# Patient Record
Sex: Female | Born: 1940
Health system: Southern US, Community
[De-identification: ages and names within clinical notes are randomized; demographics above are authoritative.]

## PROBLEM LIST (undated history)

## (undated) DIAGNOSIS — E7800 Pure hypercholesterolemia, unspecified: Secondary | ICD-10-CM

## (undated) DIAGNOSIS — Z95820 Peripheral vascular angioplasty status with implants and grafts: Secondary | ICD-10-CM

## (undated) DIAGNOSIS — I2510 Atherosclerotic heart disease of native coronary artery without angina pectoris: Secondary | ICD-10-CM

## (undated) DIAGNOSIS — R9439 Abnormal result of other cardiovascular function study: Secondary | ICD-10-CM

## (undated) DIAGNOSIS — R9431 Abnormal electrocardiogram [ECG] [EKG]: Secondary | ICD-10-CM

## (undated) DIAGNOSIS — K219 Gastro-esophageal reflux disease without esophagitis: Secondary | ICD-10-CM

## (undated) DIAGNOSIS — M189 Osteoarthritis of first carpometacarpal joint, unspecified: Secondary | ICD-10-CM

## (undated) DIAGNOSIS — A159 Respiratory tuberculosis unspecified: Secondary | ICD-10-CM

## (undated) DIAGNOSIS — M1990 Unspecified osteoarthritis, unspecified site: Secondary | ICD-10-CM

## (undated) DIAGNOSIS — C801 Malignant (primary) neoplasm, unspecified: Secondary | ICD-10-CM

## (undated) DIAGNOSIS — I10 Essential (primary) hypertension: Secondary | ICD-10-CM

## (undated) DIAGNOSIS — I208 Other forms of angina pectoris: Secondary | ICD-10-CM

## (undated) DIAGNOSIS — T4145XA Adverse effect of unspecified anesthetic, initial encounter: Secondary | ICD-10-CM

## (undated) HISTORY — PX: FOREARM / WRIST TENDON LESION EXCISION: SUR532

## (undated) HISTORY — PX: DILATION AND CURETTAGE OF UTERUS: SHX78

## (undated) HISTORY — PX: INGUINAL HERNIA REPAIR: SUR1180

## (undated) SURGERY — Surgical Case
Anesthesia: *Unknown

---
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
## 1978-05-19 HISTORY — PX: TUBAL LIGATION: SHX77

## 1998-05-03 ENCOUNTER — Other Ambulatory Visit: Admission: RE | Admit: 1998-05-03 | Discharge: 1998-05-03 | Payer: Self-pay | Admitting: *Deleted

## 1999-05-06 ENCOUNTER — Other Ambulatory Visit: Admission: RE | Admit: 1999-05-06 | Discharge: 1999-05-06 | Payer: Self-pay | Admitting: *Deleted

## 1999-08-13 ENCOUNTER — Encounter: Payer: Self-pay | Admitting: Family Medicine

## 1999-08-13 ENCOUNTER — Encounter: Admission: RE | Admit: 1999-08-13 | Discharge: 1999-08-13 | Payer: Self-pay | Admitting: Family Medicine

## 2000-01-13 ENCOUNTER — Encounter: Payer: Self-pay | Admitting: Emergency Medicine

## 2000-01-13 ENCOUNTER — Emergency Department (HOSPITAL_COMMUNITY): Admission: EM | Admit: 2000-01-13 | Discharge: 2000-01-13 | Payer: Self-pay | Admitting: Emergency Medicine

## 2000-05-07 ENCOUNTER — Other Ambulatory Visit: Admission: RE | Admit: 2000-05-07 | Discharge: 2000-05-07 | Payer: Self-pay | Admitting: *Deleted

## 2000-08-13 ENCOUNTER — Encounter: Admission: RE | Admit: 2000-08-13 | Discharge: 2000-08-13 | Payer: Self-pay | Admitting: *Deleted

## 2000-08-13 ENCOUNTER — Encounter: Payer: Self-pay | Admitting: *Deleted

## 2001-05-10 ENCOUNTER — Other Ambulatory Visit: Admission: RE | Admit: 2001-05-10 | Discharge: 2001-05-10 | Payer: Self-pay | Admitting: *Deleted

## 2001-08-24 ENCOUNTER — Encounter: Payer: Self-pay | Admitting: *Deleted

## 2001-08-24 ENCOUNTER — Encounter: Admission: RE | Admit: 2001-08-24 | Discharge: 2001-08-24 | Payer: Self-pay | Admitting: *Deleted

## 2002-07-21 ENCOUNTER — Encounter (INDEPENDENT_AMBULATORY_CARE_PROVIDER_SITE_OTHER): Payer: Self-pay | Admitting: Specialist

## 2002-07-21 ENCOUNTER — Ambulatory Visit (HOSPITAL_BASED_OUTPATIENT_CLINIC_OR_DEPARTMENT_OTHER): Admission: RE | Admit: 2002-07-21 | Discharge: 2002-07-22 | Payer: Self-pay | Admitting: General Surgery

## 2002-07-21 HISTORY — PX: OSTEOTOMY AND ULNAR SHORTENING: SHX2140

## 2002-08-30 ENCOUNTER — Encounter: Payer: Self-pay | Admitting: Family Medicine

## 2002-08-30 ENCOUNTER — Encounter: Admission: RE | Admit: 2002-08-30 | Discharge: 2002-08-30 | Payer: Self-pay | Admitting: Family Medicine

## 2002-10-31 ENCOUNTER — Ambulatory Visit (HOSPITAL_COMMUNITY): Admission: RE | Admit: 2002-10-31 | Discharge: 2002-10-31 | Payer: Self-pay | Admitting: Gastroenterology

## 2002-10-31 ENCOUNTER — Encounter: Payer: Self-pay | Admitting: Gastroenterology

## 2003-12-05 ENCOUNTER — Other Ambulatory Visit: Admission: RE | Admit: 2003-12-05 | Discharge: 2003-12-05 | Payer: Self-pay | Admitting: Obstetrics and Gynecology

## 2005-01-09 ENCOUNTER — Other Ambulatory Visit: Admission: RE | Admit: 2005-01-09 | Discharge: 2005-01-09 | Payer: Self-pay | Admitting: Obstetrics and Gynecology

## 2007-06-30 ENCOUNTER — Emergency Department (HOSPITAL_COMMUNITY): Admission: EM | Admit: 2007-06-30 | Discharge: 2007-07-01 | Payer: Self-pay | Admitting: Emergency Medicine

## 2009-05-19 DIAGNOSIS — K219 Gastro-esophageal reflux disease without esophagitis: Secondary | ICD-10-CM

## 2009-05-19 HISTORY — DX: Gastro-esophageal reflux disease without esophagitis: K21.9

## 2009-10-26 ENCOUNTER — Encounter (INDEPENDENT_AMBULATORY_CARE_PROVIDER_SITE_OTHER): Payer: Self-pay | Admitting: *Deleted

## 2009-12-17 DIAGNOSIS — T8859XA Other complications of anesthesia, initial encounter: Secondary | ICD-10-CM

## 2009-12-17 HISTORY — DX: Other complications of anesthesia, initial encounter: T88.59XA

## 2010-06-09 ENCOUNTER — Encounter: Payer: Self-pay | Admitting: Obstetrics and Gynecology

## 2010-06-18 NOTE — Letter (Signed)
Summary: Colonoscopy Date Change Letter  [facility]  [address] [place], [place] [address]   Phone: [phone]  Fax: [phone]      October 26, 2009 MRN: [id number]   Brittany Figueroa [address] [place], [place]  [address]   Dear Ms. Mussell,   Previously you were recommended to have a repeat colonoscopy around this time. Your chart was recently reviewed by Dr. Judie Petit T. Russella Dar of [facility]. Follow up colonoscopy is now recommended in July 2014. This revised recommendation is based on current, nationally recognized guidelines for colorectal cancer screening and polyp surveillance. These guidelines are endorsed by the American Cancer Society, [employer] on Colorectal Cancer as well as numerous other major medical organizations.  Please understand that our recommendation assumes that you do not have any new symptoms such as bleeding, a change in bowel habits, anemia, or significant abdominal discomfort. If you do have any concerning GI symptoms or want to discuss the guideline recommendations, please call to arrange an office visit at your earliest convenience. Otherwise we will keep you in our reminder system and contact you 1-2 months prior to the date listed above to schedule your next colonoscopy.  Thank you,  Judie Petit T. Russella Dar, M.D.  [facility] [phone]

## 2010-08-08 ENCOUNTER — Other Ambulatory Visit: Payer: Self-pay | Admitting: Obstetrics and Gynecology

## 2010-10-04 NOTE — Op Note (Signed)
NAME:  Brittany Figueroa, BURKES                           ACCOUNT NO.:  [id number]   MEDICAL RECORD NO.:  [id number]                   PATIENT TYPE:  AMB   LOCATION:  DSC                                  FACILITY:  [facility]   PHYSICIAN:  Dionne Ano. Everlene Other, M.D.         DATE OF BIRTH:  [birth year]   DATE OF PROCEDURE:  07/21/2002  DATE OF DISCHARGE:                                 OPERATIVE REPORT   PREOPERATIVE DIAGNOSIS:  Right ulnocarpal abutment with central triangular  fibrocartilage complex tear.   POSTOPERATIVE DIAGNOSIS:  Right ulnocarpal abutment with central triangular  fibrocartilage complex tear.   PROCEDURE:  1. Right ulnar shortening osteotomy with Rayhack device.  2. Right wrist arthroscopy.  3. Stress radiography, right forearm and wrist.   SURGEON:  Dionne Ano. Amanda Pea, M.D.   ASSISTANT:  Karie Chimera, P.A.-C.   COMPLICATIONS:  None.   ANESTHESIA:  General.   TOURNIQUET TIME:  Less than an hour.   ESTIMATED BLOOD LOSS:  Minimal.   INDICATIONS FOR PROCEDURE:  The patient is a very pleasant, 70 year old  female who presents with ulnocarpal abutment syndrome.  She has undergone a  previous TFC debridement but unfortunately continues to have pain due to the  ulnocarpal abutment.  I have counseled her in regards to the risks and  benefits of surgery, including risk of infection, bleeding, anesthesia,  damage to normal structures, and failure of surgery to accomplish its  intended goals of relieving symptoms and restoring function.  With this in  mind, she desires to proceed.  I have discussed the operation in detail as  outlined.  She understands the risks and benefits and desires to proceed.  Our goal is to shorten the ulna to prevent ulnocarpal abutment, to check the  TFC to make sure there is no peripheral tear or worsened degenerative tear  features.  She understands this, the risks and benefits, and desires to  proceed.   OPERATIVE FINDINGS:  The patient had  ulnar-positive variance as noted  preoperatively.  She was shortened to an ulnar neutral to slight minus level  using hole slots 2, 3 and 4 of the ulnar shortening guide.  The patient was  shortened nicely.  Bone apposition was excellent and arthroscopy revealed a  central TFC with no peripheral tear component.  She tolerated the procedure  well, and there were no complications.   OPERATIVE PROCEDURE IN DETAIL:  The patient was seen by myself and  Anesthesia.  She was taken to the operative suite and underwent a general  anesthetic and was given preoperative antibiotics.  Following this, Dr.  Earlene Plater performed a hernia repair.  Following this, the patient had the arm  identified correctly to be operated on.  The patient was prepped and draped  in the usual sterile fashion with Betadine scrub and paint in the right  upper extremity.  Following this, the patient had finger traps placed.  A  traction towel was then placed, and the patient had the tourniquet  insufflated to 250 mmHg.  An incision was made along the subcutaneous border  of the ulna.  Dissection was carried out between the ECU and FCU interval.  The ulna was then identified.  I did not perform any excessive periosteal  elevation, trying to keep the periosteum intact.  Once this was done, the  Rayhack osteotomy device was placed.  It was then secured in position  without difficulty utilizing standard protocol.  Once this was done, the  patient then underwent placement of the saw guide.  Distal was cut first,  followed by proximal.  These were slots 2 and 3.  Once the slots were cut,  the oblique portion of bone was taken out, and the osteotomy device was  removed.  Following this, I placed the compression guide on; and in holes 3  and 4, secured them to the plate which was previously placed and previously  pre-bent.  Following this, tension was tightened, and the shortening device  allowed for excellent compression.  I was pleased  with the compression at  this time.  I filled the remaining screw holes, including the  interfragmentary screw.  This was done with a 2.7 followed by a 2.0 drill  bit and a 2.7 screw was then placed for compression purposes.  The patient  tolerated this well.  I was very pleased with the compression and alignment.  The patient had excellent apposition as checked under radiographic view and  stress radiography.  She was shortened to an ulnar neutral with slight ulnar  minus variance.  The patient tolerated this well without difficulty, and  there were no complications.  Once this was done, the patient then underwent  a wrist arthroscopy utilizing a 3-4 and 6 R-portal.  Dissection was carried  down, and the scope was introduced through a 3-4 portal.  Outflow cannula  was created.  Following this, a systematic arthroscopy was accomplished.  There was some fraying and degenerative changes in the wrist joint itself,  including fraying of the scapholunate membranous portion of the ligament.  There were no destabilizing tears noted.  The TFC had central tearing which  was previously debrided.  The patient had no peripheral avulsion.  I was  pleased with the findings.  The patient had a limited debridement without  difficulty applied.  Once this was done, I removed the arthroscopic  instruments.  At this point in time, I washed the arm and with the  tourniquet deflated, closed the interval between the ECU and FCU, followed  by closure of the subcutaneous tissue with Vicryl and closure of the skin  edge with Prolene.  She had soft compartments and no excessive bleeding and  looked excellent.  Arthroscopic portals were closed with interrupted  Prolene.  She was placed in a long-arm splint with the hand in functional  position.  She tolerated this well.  She was then extubated and transferred  to the recovery room.  She will be monitored closely.  We will begin IV antibiotics, close observation, and  postoperative protocol.  Postoperative  hernia protocol per Dr. Earlene Plater will be adhered to, and his dictation will be  on a separate line.  All questions have been encouraged and answered.  Dionne Ano. Everlene Other, M.D.    Nash Mantis  D:  07/21/2002  T:  07/22/2002  Job:  [id number]

## 2010-10-04 NOTE — Op Note (Signed)
NAME:  Brittany Figueroa, Brittany Figueroa                           ACCOUNT NO.:  [id number]   MEDICAL RECORD NO.:  [id number]                   PATIENT TYPE:  AMB   LOCATION:  DSC                                  FACILITY:  [facility]   PHYSICIAN:  Timothy E. Earlene Plater, M.D.              DATE OF BIRTH:  11/19/1940   DATE OF PROCEDURE:  07/21/2002  DATE OF DISCHARGE:                                 OPERATIVE REPORT   PREOPERATIVE DIAGNOSIS:  Left inguinal hernia.   POSTOPERATIVE DIAGNOSIS:  Left indirect inguinal hernia.   PROCEDURE:  Repair of hernia with mesh.   SURGEON:  Timothy E. Earlene Plater, M.D.   ANESTHESIA:  General.   INDICATIONS FOR PROCEDURE:  The patient presents with a left inguinal hernia  of increasing size and discomfort and she wishes to have a repair.  She did  have a right inguinal hernia repaired remotely and she is undergoing a wrist  arthroscopy today at the same location and same anesthetic by Dionne Ano.  Amanda Pea, M.D.  The patient was evaluated, identified, permit signed, and the  site marked.   DESCRIPTION OF PROCEDURE:  She was taken to the operating room, placed  supine, general endotracheal anesthesia administered.  The left groin was  shaved, prepped and draped in the usual fashion.  0.25% Marcaine with  epinephrine was used prior to incision. A short curvilinear incision was  made in the left groin, the abundant subcutaneous fatty tissue dissection,  the external oblique identified, and a large canal was noted. More local  anesthesia was applied and the external oblique opened in line with the  fibers to the external ring. A large indirect hernia and lipoma were noted.  This was dissected from the floor of the canal. The round ligament was  small.  The ilioinguinal nerve was identified and kept medial to the  dissection.  The sac and lipoma were dissected out back to the internal  ring. The sac was opened, it was empty and the sac, lipoma, and round  ligament were ligated with a  2-0 Prolene suture.  The excess sac and lipoma  were excised and the neck retracted through the internal ring.  Specimen was  submitted for pathology.  The internal ring closed with a suture of 2-0  Prolene.  A small piece of mesh was cut in a fashion to fit the floor of the  canal and sewn down with running 2-0 Prolene sutures to cover the entire  floor.  The external oblique was closed with a running 2-0 Vicryl.  Deep  subcu with 2-0 Vicryl and the skin with 3-0 Monocryl.  Steri-Strips were  applied. A dry sterile dressing was applied.  Needle, sponge, and instrument  count correct.  The patient tolerated the procedure well and was stable.  The patient was turned over to [facility]. Amanda Pea, M.D. for his surgery.  Timothy E. Earlene Plater, M.D.    TED/MEDQ  D:  07/21/2002  T:  07/22/2002  Job:  [id number]

## 2011-02-07 LAB — CK TOTAL AND CKMB (NOT AT ARMC)
CK, MB: 1.9
Relative Index: 1.8
Total CK: 105

## 2011-02-07 LAB — BASIC METABOLIC PANEL
BUN: 19
CO2: 26
Calcium: 9
Chloride: 101
Creatinine, Ser: 0.77
GFR calc Af Amer: >60
GFR calc non Af Amer: >60
Glucose, Bld: 108 — ABNORMAL HIGH
Potassium: 4.1
Sodium: 135

## 2011-02-07 LAB — TROPONIN I: Troponin I: 0.04

## 2011-05-27 DIAGNOSIS — R7989 Other specified abnormal findings of blood chemistry: Secondary | ICD-10-CM | POA: Diagnosis not present

## 2011-05-27 DIAGNOSIS — J209 Acute bronchitis, unspecified: Secondary | ICD-10-CM | POA: Diagnosis not present

## 2011-06-04 DIAGNOSIS — K219 Gastro-esophageal reflux disease without esophagitis: Secondary | ICD-10-CM | POA: Diagnosis not present

## 2011-06-19 DIAGNOSIS — R209 Unspecified disturbances of skin sensation: Secondary | ICD-10-CM | POA: Diagnosis not present

## 2011-06-19 DIAGNOSIS — I10 Essential (primary) hypertension: Secondary | ICD-10-CM | POA: Diagnosis not present

## 2011-06-19 DIAGNOSIS — E785 Hyperlipidemia, unspecified: Secondary | ICD-10-CM | POA: Diagnosis not present

## 2011-06-19 DIAGNOSIS — R7301 Impaired fasting glucose: Secondary | ICD-10-CM | POA: Diagnosis not present

## 2011-06-26 DIAGNOSIS — E785 Hyperlipidemia, unspecified: Secondary | ICD-10-CM | POA: Diagnosis not present

## 2011-06-26 DIAGNOSIS — R7301 Impaired fasting glucose: Secondary | ICD-10-CM | POA: Diagnosis not present

## 2011-06-26 DIAGNOSIS — I10 Essential (primary) hypertension: Secondary | ICD-10-CM | POA: Diagnosis not present

## 2011-06-26 DIAGNOSIS — J309 Allergic rhinitis, unspecified: Secondary | ICD-10-CM | POA: Diagnosis not present

## 2011-07-17 DIAGNOSIS — M19049 Primary osteoarthritis, unspecified hand: Secondary | ICD-10-CM | POA: Diagnosis not present

## 2011-07-17 DIAGNOSIS — M25549 Pain in joints of unspecified hand: Secondary | ICD-10-CM | POA: Diagnosis not present

## 2011-08-05 DIAGNOSIS — M2010 Hallux valgus (acquired), unspecified foot: Secondary | ICD-10-CM | POA: Diagnosis not present

## 2011-08-07 DIAGNOSIS — R7301 Impaired fasting glucose: Secondary | ICD-10-CM | POA: Diagnosis not present

## 2011-08-07 DIAGNOSIS — I10 Essential (primary) hypertension: Secondary | ICD-10-CM | POA: Diagnosis not present

## 2011-08-07 DIAGNOSIS — E785 Hyperlipidemia, unspecified: Secondary | ICD-10-CM | POA: Diagnosis not present

## 2011-09-03 DIAGNOSIS — D485 Neoplasm of uncertain behavior of skin: Secondary | ICD-10-CM | POA: Diagnosis not present

## 2011-09-04 DIAGNOSIS — Z1212 Encounter for screening for malignant neoplasm of rectum: Secondary | ICD-10-CM | POA: Diagnosis not present

## 2011-09-04 DIAGNOSIS — Z1289 Encounter for screening for malignant neoplasm of other sites: Secondary | ICD-10-CM | POA: Diagnosis not present

## 2011-09-04 DIAGNOSIS — Z1231 Encounter for screening mammogram for malignant neoplasm of breast: Secondary | ICD-10-CM | POA: Diagnosis not present

## 2011-09-18 DIAGNOSIS — E559 Vitamin D deficiency, unspecified: Secondary | ICD-10-CM | POA: Diagnosis not present

## 2011-09-18 DIAGNOSIS — E785 Hyperlipidemia, unspecified: Secondary | ICD-10-CM | POA: Diagnosis not present

## 2011-09-18 DIAGNOSIS — R7301 Impaired fasting glucose: Secondary | ICD-10-CM | POA: Diagnosis not present

## 2011-09-18 DIAGNOSIS — I10 Essential (primary) hypertension: Secondary | ICD-10-CM | POA: Diagnosis not present

## 2011-09-25 DIAGNOSIS — R7301 Impaired fasting glucose: Secondary | ICD-10-CM | POA: Diagnosis not present

## 2011-09-25 DIAGNOSIS — I10 Essential (primary) hypertension: Secondary | ICD-10-CM | POA: Diagnosis not present

## 2011-09-25 DIAGNOSIS — E559 Vitamin D deficiency, unspecified: Secondary | ICD-10-CM | POA: Diagnosis not present

## 2011-09-25 DIAGNOSIS — E785 Hyperlipidemia, unspecified: Secondary | ICD-10-CM | POA: Diagnosis not present

## 2011-11-07 DIAGNOSIS — R21 Rash and other nonspecific skin eruption: Secondary | ICD-10-CM | POA: Diagnosis not present

## 2011-12-18 DIAGNOSIS — M67919 Unspecified disorder of synovium and tendon, unspecified shoulder: Secondary | ICD-10-CM | POA: Diagnosis not present

## 2011-12-18 DIAGNOSIS — M719 Bursopathy, unspecified: Secondary | ICD-10-CM | POA: Diagnosis not present

## 2011-12-18 DIAGNOSIS — M25519 Pain in unspecified shoulder: Secondary | ICD-10-CM | POA: Diagnosis not present

## 2011-12-26 DIAGNOSIS — E785 Hyperlipidemia, unspecified: Secondary | ICD-10-CM | POA: Diagnosis not present

## 2011-12-26 DIAGNOSIS — I10 Essential (primary) hypertension: Secondary | ICD-10-CM | POA: Diagnosis not present

## 2011-12-26 DIAGNOSIS — R7301 Impaired fasting glucose: Secondary | ICD-10-CM | POA: Diagnosis not present

## 2012-01-02 DIAGNOSIS — I10 Essential (primary) hypertension: Secondary | ICD-10-CM | POA: Diagnosis not present

## 2012-01-02 DIAGNOSIS — E785 Hyperlipidemia, unspecified: Secondary | ICD-10-CM | POA: Diagnosis not present

## 2012-01-02 DIAGNOSIS — R7301 Impaired fasting glucose: Secondary | ICD-10-CM | POA: Diagnosis not present

## 2012-01-02 DIAGNOSIS — E559 Vitamin D deficiency, unspecified: Secondary | ICD-10-CM | POA: Diagnosis not present

## 2012-01-15 DIAGNOSIS — M719 Bursopathy, unspecified: Secondary | ICD-10-CM | POA: Diagnosis not present

## 2012-01-15 DIAGNOSIS — S93409A Sprain of unspecified ligament of unspecified ankle, initial encounter: Secondary | ICD-10-CM | POA: Diagnosis not present

## 2012-01-15 DIAGNOSIS — M67919 Unspecified disorder of synovium and tendon, unspecified shoulder: Secondary | ICD-10-CM | POA: Diagnosis not present

## 2012-02-03 DIAGNOSIS — M9981 Other biomechanical lesions of cervical region: Secondary | ICD-10-CM | POA: Diagnosis not present

## 2012-02-03 DIAGNOSIS — M999 Biomechanical lesion, unspecified: Secondary | ICD-10-CM | POA: Diagnosis not present

## 2012-02-03 DIAGNOSIS — M5030 Other cervical disc degeneration, unspecified cervical region: Secondary | ICD-10-CM | POA: Diagnosis not present

## 2012-02-05 DIAGNOSIS — I10 Essential (primary) hypertension: Secondary | ICD-10-CM | POA: Diagnosis not present

## 2012-02-05 DIAGNOSIS — E785 Hyperlipidemia, unspecified: Secondary | ICD-10-CM | POA: Diagnosis not present

## 2012-02-05 DIAGNOSIS — R7301 Impaired fasting glucose: Secondary | ICD-10-CM | POA: Diagnosis not present

## 2012-02-12 DIAGNOSIS — M25579 Pain in unspecified ankle and joints of unspecified foot: Secondary | ICD-10-CM | POA: Diagnosis not present

## 2012-02-12 DIAGNOSIS — M542 Cervicalgia: Secondary | ICD-10-CM | POA: Diagnosis not present

## 2012-02-13 DIAGNOSIS — M542 Cervicalgia: Secondary | ICD-10-CM | POA: Diagnosis not present

## 2012-02-18 DIAGNOSIS — M67919 Unspecified disorder of synovium and tendon, unspecified shoulder: Secondary | ICD-10-CM | POA: Diagnosis not present

## 2012-02-18 DIAGNOSIS — M999 Biomechanical lesion, unspecified: Secondary | ICD-10-CM | POA: Diagnosis not present

## 2012-02-18 DIAGNOSIS — M542 Cervicalgia: Secondary | ICD-10-CM | POA: Diagnosis not present

## 2012-02-18 DIAGNOSIS — Q7649 Other congenital malformations of spine, not associated with scoliosis: Secondary | ICD-10-CM | POA: Diagnosis not present

## 2012-02-18 DIAGNOSIS — M5030 Other cervical disc degeneration, unspecified cervical region: Secondary | ICD-10-CM | POA: Diagnosis not present

## 2012-02-18 DIAGNOSIS — M9981 Other biomechanical lesions of cervical region: Secondary | ICD-10-CM | POA: Diagnosis not present

## 2012-03-01 DIAGNOSIS — M719 Bursopathy, unspecified: Secondary | ICD-10-CM | POA: Diagnosis not present

## 2012-03-01 DIAGNOSIS — M542 Cervicalgia: Secondary | ICD-10-CM | POA: Diagnosis not present

## 2012-03-01 DIAGNOSIS — M999 Biomechanical lesion, unspecified: Secondary | ICD-10-CM | POA: Diagnosis not present

## 2012-03-01 DIAGNOSIS — M5030 Other cervical disc degeneration, unspecified cervical region: Secondary | ICD-10-CM | POA: Diagnosis not present

## 2012-03-01 DIAGNOSIS — M9981 Other biomechanical lesions of cervical region: Secondary | ICD-10-CM | POA: Diagnosis not present

## 2012-03-01 DIAGNOSIS — Q7649 Other congenital malformations of spine, not associated with scoliosis: Secondary | ICD-10-CM | POA: Diagnosis not present

## 2012-03-01 DIAGNOSIS — M67919 Unspecified disorder of synovium and tendon, unspecified shoulder: Secondary | ICD-10-CM | POA: Diagnosis not present

## 2012-03-05 DIAGNOSIS — Q7649 Other congenital malformations of spine, not associated with scoliosis: Secondary | ICD-10-CM | POA: Diagnosis not present

## 2012-03-05 DIAGNOSIS — M542 Cervicalgia: Secondary | ICD-10-CM | POA: Diagnosis not present

## 2012-03-05 DIAGNOSIS — M67919 Unspecified disorder of synovium and tendon, unspecified shoulder: Secondary | ICD-10-CM | POA: Diagnosis not present

## 2012-03-10 DIAGNOSIS — Q7649 Other congenital malformations of spine, not associated with scoliosis: Secondary | ICD-10-CM | POA: Diagnosis not present

## 2012-03-10 DIAGNOSIS — M542 Cervicalgia: Secondary | ICD-10-CM | POA: Diagnosis not present

## 2012-03-10 DIAGNOSIS — M67919 Unspecified disorder of synovium and tendon, unspecified shoulder: Secondary | ICD-10-CM | POA: Diagnosis not present

## 2012-03-10 DIAGNOSIS — M719 Bursopathy, unspecified: Secondary | ICD-10-CM | POA: Diagnosis not present

## 2012-03-15 DIAGNOSIS — M719 Bursopathy, unspecified: Secondary | ICD-10-CM | POA: Diagnosis not present

## 2012-03-15 DIAGNOSIS — M67919 Unspecified disorder of synovium and tendon, unspecified shoulder: Secondary | ICD-10-CM | POA: Diagnosis not present

## 2012-03-15 DIAGNOSIS — M542 Cervicalgia: Secondary | ICD-10-CM | POA: Diagnosis not present

## 2012-03-15 DIAGNOSIS — Q7649 Other congenital malformations of spine, not associated with scoliosis: Secondary | ICD-10-CM | POA: Diagnosis not present

## 2012-03-19 DIAGNOSIS — M542 Cervicalgia: Secondary | ICD-10-CM | POA: Diagnosis not present

## 2012-03-19 DIAGNOSIS — Q7649 Other congenital malformations of spine, not associated with scoliosis: Secondary | ICD-10-CM | POA: Diagnosis not present

## 2012-03-19 DIAGNOSIS — M67919 Unspecified disorder of synovium and tendon, unspecified shoulder: Secondary | ICD-10-CM | POA: Diagnosis not present

## 2012-03-24 DIAGNOSIS — M67919 Unspecified disorder of synovium and tendon, unspecified shoulder: Secondary | ICD-10-CM | POA: Diagnosis not present

## 2012-03-24 DIAGNOSIS — R9430 Abnormal result of cardiovascular function study, unspecified: Secondary | ICD-10-CM | POA: Diagnosis not present

## 2012-03-24 DIAGNOSIS — E782 Mixed hyperlipidemia: Secondary | ICD-10-CM | POA: Diagnosis not present

## 2012-03-24 DIAGNOSIS — I10 Essential (primary) hypertension: Secondary | ICD-10-CM | POA: Diagnosis not present

## 2012-03-24 DIAGNOSIS — M719 Bursopathy, unspecified: Secondary | ICD-10-CM | POA: Diagnosis not present

## 2012-03-26 DIAGNOSIS — M719 Bursopathy, unspecified: Secondary | ICD-10-CM | POA: Diagnosis not present

## 2012-03-26 DIAGNOSIS — M67919 Unspecified disorder of synovium and tendon, unspecified shoulder: Secondary | ICD-10-CM | POA: Diagnosis not present

## 2012-03-26 DIAGNOSIS — Q7649 Other congenital malformations of spine, not associated with scoliosis: Secondary | ICD-10-CM | POA: Diagnosis not present

## 2012-03-26 DIAGNOSIS — M542 Cervicalgia: Secondary | ICD-10-CM | POA: Diagnosis not present

## 2012-03-29 DIAGNOSIS — R7301 Impaired fasting glucose: Secondary | ICD-10-CM | POA: Diagnosis not present

## 2012-03-29 DIAGNOSIS — IMO0001 Reserved for inherently not codable concepts without codable children: Secondary | ICD-10-CM | POA: Diagnosis not present

## 2012-03-29 DIAGNOSIS — E785 Hyperlipidemia, unspecified: Secondary | ICD-10-CM | POA: Diagnosis not present

## 2012-03-29 DIAGNOSIS — I10 Essential (primary) hypertension: Secondary | ICD-10-CM | POA: Diagnosis not present

## 2012-03-29 DIAGNOSIS — Z1331 Encounter for screening for depression: Secondary | ICD-10-CM | POA: Diagnosis not present

## 2012-03-29 DIAGNOSIS — Z23 Encounter for immunization: Secondary | ICD-10-CM | POA: Diagnosis not present

## 2012-03-29 DIAGNOSIS — Z0000 Encounter for general adult medical examination without abnormal findings: Secondary | ICD-10-CM | POA: Diagnosis not present

## 2012-03-31 DIAGNOSIS — I10 Essential (primary) hypertension: Secondary | ICD-10-CM | POA: Diagnosis not present

## 2012-03-31 DIAGNOSIS — M542 Cervicalgia: Secondary | ICD-10-CM | POA: Diagnosis not present

## 2012-03-31 DIAGNOSIS — M67919 Unspecified disorder of synovium and tendon, unspecified shoulder: Secondary | ICD-10-CM | POA: Diagnosis not present

## 2012-03-31 DIAGNOSIS — M719 Bursopathy, unspecified: Secondary | ICD-10-CM | POA: Diagnosis not present

## 2012-03-31 DIAGNOSIS — Q7649 Other congenital malformations of spine, not associated with scoliosis: Secondary | ICD-10-CM | POA: Diagnosis not present

## 2012-04-01 ENCOUNTER — Other Ambulatory Visit (HOSPITAL_COMMUNITY): Payer: Self-pay | Admitting: *Deleted

## 2012-04-01 DIAGNOSIS — R9431 Abnormal electrocardiogram [ECG] [EKG]: Secondary | ICD-10-CM

## 2012-04-01 DIAGNOSIS — M19019 Primary osteoarthritis, unspecified shoulder: Secondary | ICD-10-CM | POA: Diagnosis not present

## 2012-04-05 DIAGNOSIS — E785 Hyperlipidemia, unspecified: Secondary | ICD-10-CM | POA: Diagnosis not present

## 2012-04-05 DIAGNOSIS — E559 Vitamin D deficiency, unspecified: Secondary | ICD-10-CM | POA: Diagnosis not present

## 2012-04-05 DIAGNOSIS — R7301 Impaired fasting glucose: Secondary | ICD-10-CM | POA: Diagnosis not present

## 2012-04-05 DIAGNOSIS — F411 Generalized anxiety disorder: Secondary | ICD-10-CM | POA: Diagnosis not present

## 2012-04-05 DIAGNOSIS — M542 Cervicalgia: Secondary | ICD-10-CM | POA: Diagnosis not present

## 2012-04-05 DIAGNOSIS — I10 Essential (primary) hypertension: Secondary | ICD-10-CM | POA: Diagnosis not present

## 2012-04-06 DIAGNOSIS — M719 Bursopathy, unspecified: Secondary | ICD-10-CM | POA: Diagnosis not present

## 2012-04-06 DIAGNOSIS — M67919 Unspecified disorder of synovium and tendon, unspecified shoulder: Secondary | ICD-10-CM | POA: Diagnosis not present

## 2012-04-06 DIAGNOSIS — M542 Cervicalgia: Secondary | ICD-10-CM | POA: Diagnosis not present

## 2012-04-06 DIAGNOSIS — Q7649 Other congenital malformations of spine, not associated with scoliosis: Secondary | ICD-10-CM | POA: Diagnosis not present

## 2012-04-07 DIAGNOSIS — M67919 Unspecified disorder of synovium and tendon, unspecified shoulder: Secondary | ICD-10-CM | POA: Diagnosis not present

## 2012-04-07 DIAGNOSIS — M542 Cervicalgia: Secondary | ICD-10-CM | POA: Diagnosis not present

## 2012-04-07 DIAGNOSIS — M719 Bursopathy, unspecified: Secondary | ICD-10-CM | POA: Diagnosis not present

## 2012-04-07 DIAGNOSIS — M19019 Primary osteoarthritis, unspecified shoulder: Secondary | ICD-10-CM | POA: Diagnosis not present

## 2012-04-13 ENCOUNTER — Other Ambulatory Visit (HOSPITAL_COMMUNITY): Payer: Self-pay | Admitting: Cardiovascular Disease

## 2012-04-13 ENCOUNTER — Ambulatory Visit (HOSPITAL_COMMUNITY)
Admission: RE | Admit: 2012-04-13 | Discharge: 2012-04-13 | Disposition: A | Discharge status: Home / Self Care | Payer: Medicare Other | Source: Ambulatory Visit | Attending: Cardiovascular Disease | Admitting: Cardiovascular Disease

## 2012-04-13 DIAGNOSIS — I10 Essential (primary) hypertension: Secondary | ICD-10-CM | POA: Diagnosis not present

## 2012-04-13 DIAGNOSIS — R9431 Abnormal electrocardiogram [ECG] [EKG]: Secondary | ICD-10-CM | POA: Insufficient documentation

## 2012-04-13 DIAGNOSIS — Z87891 Personal history of nicotine dependence: Secondary | ICD-10-CM | POA: Insufficient documentation

## 2012-04-13 DIAGNOSIS — I700 Atherosclerosis of aorta: Secondary | ICD-10-CM

## 2012-04-13 DIAGNOSIS — I739 Peripheral vascular disease, unspecified: Secondary | ICD-10-CM | POA: Insufficient documentation

## 2012-04-13 DIAGNOSIS — E785 Hyperlipidemia, unspecified: Secondary | ICD-10-CM | POA: Insufficient documentation

## 2012-04-13 MED ORDER — REGADENOSON 0.4 MG/5ML IV SOLN
0.4000 mg | Freq: Once | INTRAVENOUS | Status: AC
  Administered 2012-04-13: 0.4 mg via INTRAVENOUS

## 2012-04-13 MED ORDER — TECHNETIUM TC 99M SESTAMIBI GENERIC - CARDIOLITE
25.2000 | Freq: Once | INTRAVENOUS | Status: AC | PRN
  Administered 2012-04-13: 25.2 via INTRAVENOUS

## 2012-04-13 MED ORDER — TECHNETIUM TC 99M SESTAMIBI GENERIC - CARDIOLITE: 8.3000 | Freq: Once | INTRAVENOUS | Status: AC | PRN

## 2012-04-13 NOTE — Procedures (Addendum)
[name] [address] [place] [address] [phone]  Cardiology Nuclear Med Study  Brittany Figueroa is a 71 y.o. female     MRN : [id number]     DOB: 06/07/1940  Procedure Date: 04/13/2012  Nuclear Med Background Indication for Stress Test:  Abnormal EKG History:  No prior cardiac history Cardiac Risk Factors: History of Smoking, Hypertension, Lipids and PVD  Symptoms:  No symptoms   Nuclear Pre-Procedure Caffeine/Decaff Intake:  7:00pm NPO After: 5:00am   IV Site: R Antecubital  IV 0.9% NS with Angio Cath:  22g  Chest Size (in):  n/a IV Started by: Koren Shiver, CNMT  Height: 5\' 2"  (1.575 m)  Cup Size: C  BMI:  Body mass index is 25.97 kg/(m^2). Weight:  142 lb (64.411 kg)   Tech Comments:  n/a    Nuclear Med Study 1 or 2 day study: 1 day  Stress Test Type:  Lexiscan  Order Authorizing Provider:  Nanetta Batty, MD   Resting Radionuclide: Technetium 65m Sestamibi  Resting Radionuclide Dose: 8.3 mCi   Stress Radionuclide:  Technetium 23m Sestamibi  Stress Radionuclide Dose: 25.2 mCi           Stress Protocol Rest HR: 66 Stress HR: 105  Rest BP: 153/84 Stress BP:   Exercise Time (min): n/a METS: n/a   Predicted Max HR: 151 bpm % Max HR: 43.71 bpm Rate Pressure Product: 41324   Dose of Adenosine (mg):  n/a Dose of Lexiscan: 0.4 mg  Dose of Atropine (mg): n/a Dose of Dobutamine: n/a mcg/kg/min (at max HR)  Stress Test Technologist: Esperanza Sheets, CCT Nuclear Technologist: Gonzella Lex, CNMT   Rest Procedure:  Myocardial perfusion imaging was performed at rest 45 minutes following the intravenous administration of Technetium 21m Sestamibi. Stress Procedure:  The patient received IV Lexiscan 0.4 mg over 15-seconds.  Technetium 58m Sestamibi injected at 30-seconds.  There were no significant changes with Lexiscan.  Quantitative spect images were obtained after a 45 minute delay.  Transient Ischemic  Dilatation (Normal <1.22):  1.13 Lung/Heart Ratio (Normal <0.45):  0.29 QGS EDV:  87 ml QGS ESV:  33 ml LV Ejection Fraction: 62%  .      Rest ECG: NSR; Q wave in avl; inferolateral T wave inversion.  Stress ECG: No significant change from baseline ECG  QPS Raw Data Images:  There is interference from nuclear activity from structures below the diaphragm. This does not affect the ability to read the study. Stress Images:  Small in size, mild in intensity mid to basal inferior to inferosepwtal perfusion defect. Rest Images:  Normal homogeneous uptake in all areas of the myocardium. Subtraction (SDS):  Mild ischemia in the mid to basal inferior to inferoseptal region.  Impression Exercise Capacity:  Lexiscan with no exercise. BP Response:  Normal blood pressure response. Clinical Symptoms:  No chest pain. ECG Impression:  No significant new ST segment changes compared to baseline ECG abnormalities. Comparison with Prior Nuclear Study: In comparison to prior study mild inferior to inferoseptal ischemia is new.  Overall Impression:  Abnormal stress nuclear study demonstrated a small region of mild inferior to inferoseptal ischemia.  LV Wall Motion:  NL LV Function; NL Wall Motion. EF 62%.   [name], MD  04/13/2012 1:49 PM

## 2012-04-14 DIAGNOSIS — M67919 Unspecified disorder of synovium and tendon, unspecified shoulder: Secondary | ICD-10-CM | POA: Diagnosis not present

## 2012-04-14 DIAGNOSIS — Q7649 Other congenital malformations of spine, not associated with scoliosis: Secondary | ICD-10-CM | POA: Diagnosis not present

## 2012-04-14 DIAGNOSIS — M25559 Pain in unspecified hip: Secondary | ICD-10-CM | POA: Diagnosis not present

## 2012-04-14 DIAGNOSIS — M79609 Pain in unspecified limb: Secondary | ICD-10-CM | POA: Diagnosis not present

## 2012-04-14 DIAGNOSIS — M19049 Primary osteoarthritis, unspecified hand: Secondary | ICD-10-CM | POA: Diagnosis not present

## 2012-04-14 DIAGNOSIS — M719 Bursopathy, unspecified: Secondary | ICD-10-CM | POA: Diagnosis not present

## 2012-04-14 DIAGNOSIS — M542 Cervicalgia: Secondary | ICD-10-CM | POA: Diagnosis not present

## 2012-04-14 DIAGNOSIS — M76899 Other specified enthesopathies of unspecified lower limb, excluding foot: Secondary | ICD-10-CM | POA: Diagnosis not present

## 2012-04-19 DIAGNOSIS — Q7649 Other congenital malformations of spine, not associated with scoliosis: Secondary | ICD-10-CM | POA: Diagnosis not present

## 2012-04-19 DIAGNOSIS — M67919 Unspecified disorder of synovium and tendon, unspecified shoulder: Secondary | ICD-10-CM | POA: Diagnosis not present

## 2012-04-19 DIAGNOSIS — M542 Cervicalgia: Secondary | ICD-10-CM | POA: Diagnosis not present

## 2012-04-22 ENCOUNTER — Encounter (HOSPITAL_COMMUNITY): Payer: 59

## 2012-04-23 ENCOUNTER — Encounter (HOSPITAL_COMMUNITY): Payer: 59

## 2012-04-26 ENCOUNTER — Encounter (HOSPITAL_COMMUNITY): Payer: 59

## 2012-04-26 DIAGNOSIS — Q7649 Other congenital malformations of spine, not associated with scoliosis: Secondary | ICD-10-CM | POA: Diagnosis not present

## 2012-04-26 DIAGNOSIS — M542 Cervicalgia: Secondary | ICD-10-CM | POA: Diagnosis not present

## 2012-04-26 DIAGNOSIS — M719 Bursopathy, unspecified: Secondary | ICD-10-CM | POA: Diagnosis not present

## 2012-04-26 DIAGNOSIS — M67919 Unspecified disorder of synovium and tendon, unspecified shoulder: Secondary | ICD-10-CM | POA: Diagnosis not present

## 2012-04-27 ENCOUNTER — Ambulatory Visit (HOSPITAL_COMMUNITY)
Admission: RE | Admit: 2012-04-27 | Discharge: 2012-04-27 | Disposition: A | Discharge status: Home / Self Care | Payer: Medicare Other | Source: Ambulatory Visit | Attending: Cardiovascular Disease | Admitting: Cardiovascular Disease

## 2012-04-27 ENCOUNTER — Encounter (HOSPITAL_COMMUNITY): Payer: 59

## 2012-04-27 DIAGNOSIS — I700 Atherosclerosis of aorta: Secondary | ICD-10-CM | POA: Diagnosis not present

## 2012-04-27 DIAGNOSIS — I359 Nonrheumatic aortic valve disorder, unspecified: Secondary | ICD-10-CM | POA: Insufficient documentation

## 2012-04-27 NOTE — Progress Notes (Signed)
Aorta Duplex completed. Glennice Marcos D  

## 2012-04-29 DIAGNOSIS — E782 Mixed hyperlipidemia: Secondary | ICD-10-CM | POA: Diagnosis not present

## 2012-04-29 DIAGNOSIS — I10 Essential (primary) hypertension: Secondary | ICD-10-CM | POA: Diagnosis not present

## 2012-04-29 DIAGNOSIS — R9431 Abnormal electrocardiogram [ECG] [EKG]: Secondary | ICD-10-CM | POA: Diagnosis not present

## 2012-04-30 ENCOUNTER — Encounter (HOSPITAL_COMMUNITY): Payer: Self-pay | Admitting: Respiratory Therapy

## 2012-04-30 DIAGNOSIS — Q7649 Other congenital malformations of spine, not associated with scoliosis: Secondary | ICD-10-CM | POA: Diagnosis not present

## 2012-04-30 DIAGNOSIS — M542 Cervicalgia: Secondary | ICD-10-CM | POA: Diagnosis not present

## 2012-04-30 DIAGNOSIS — M719 Bursopathy, unspecified: Secondary | ICD-10-CM | POA: Diagnosis not present

## 2012-04-30 DIAGNOSIS — M67919 Unspecified disorder of synovium and tendon, unspecified shoulder: Secondary | ICD-10-CM | POA: Diagnosis not present

## 2012-05-03 DIAGNOSIS — Q7649 Other congenital malformations of spine, not associated with scoliosis: Secondary | ICD-10-CM | POA: Diagnosis not present

## 2012-05-03 DIAGNOSIS — M542 Cervicalgia: Secondary | ICD-10-CM | POA: Diagnosis not present

## 2012-05-03 DIAGNOSIS — M67919 Unspecified disorder of synovium and tendon, unspecified shoulder: Secondary | ICD-10-CM | POA: Diagnosis not present

## 2012-05-06 DIAGNOSIS — E785 Hyperlipidemia, unspecified: Secondary | ICD-10-CM | POA: Diagnosis not present

## 2012-05-06 DIAGNOSIS — R7301 Impaired fasting glucose: Secondary | ICD-10-CM | POA: Diagnosis not present

## 2012-05-06 DIAGNOSIS — I10 Essential (primary) hypertension: Secondary | ICD-10-CM | POA: Diagnosis not present

## 2012-05-07 ENCOUNTER — Ambulatory Visit
Admission: RE | Admit: 2012-05-07 | Discharge: 2012-05-07 | Disposition: A | Discharge status: Home / Self Care | Payer: Medicare Other | Source: Ambulatory Visit | Attending: Cardiovascular Disease | Admitting: Cardiovascular Disease

## 2012-05-07 ENCOUNTER — Other Ambulatory Visit: Payer: Self-pay | Admitting: Cardiovascular Disease

## 2012-05-07 DIAGNOSIS — R5383 Other fatigue: Secondary | ICD-10-CM | POA: Diagnosis not present

## 2012-05-07 DIAGNOSIS — R079 Chest pain, unspecified: Secondary | ICD-10-CM

## 2012-05-07 DIAGNOSIS — D689 Coagulation defect, unspecified: Secondary | ICD-10-CM | POA: Diagnosis not present

## 2012-05-07 DIAGNOSIS — R5381 Other malaise: Secondary | ICD-10-CM | POA: Diagnosis not present

## 2012-05-07 DIAGNOSIS — Z79899 Other long term (current) drug therapy: Secondary | ICD-10-CM | POA: Diagnosis not present

## 2012-05-07 DIAGNOSIS — Z01810 Encounter for preprocedural cardiovascular examination: Secondary | ICD-10-CM | POA: Diagnosis not present

## 2012-05-13 ENCOUNTER — Ambulatory Visit (HOSPITAL_COMMUNITY)
Admission: RE | Admit: 2012-05-13 | Discharge: 2012-05-14 | Disposition: A | Discharge status: Home / Self Care | Payer: Medicare Other | Source: Ambulatory Visit | Attending: Cardiovascular Disease | Admitting: Cardiovascular Disease

## 2012-05-13 ENCOUNTER — Encounter (HOSPITAL_COMMUNITY): Payer: Self-pay | Admitting: General Practice

## 2012-05-13 ENCOUNTER — Encounter (HOSPITAL_COMMUNITY)
Admission: RE | Disposition: A | Discharge status: Home / Self Care | Payer: Self-pay | Source: Ambulatory Visit | Attending: Cardiovascular Disease

## 2012-05-13 DIAGNOSIS — I10 Essential (primary) hypertension: Secondary | ICD-10-CM | POA: Diagnosis not present

## 2012-05-13 DIAGNOSIS — I209 Angina pectoris, unspecified: Secondary | ICD-10-CM | POA: Diagnosis not present

## 2012-05-13 DIAGNOSIS — I2510 Atherosclerotic heart disease of native coronary artery without angina pectoris: Secondary | ICD-10-CM | POA: Diagnosis present

## 2012-05-13 DIAGNOSIS — I2089 Other forms of angina pectoris: Secondary | ICD-10-CM | POA: Diagnosis present

## 2012-05-13 DIAGNOSIS — E785 Hyperlipidemia, unspecified: Secondary | ICD-10-CM | POA: Diagnosis not present

## 2012-05-13 DIAGNOSIS — I208 Other forms of angina pectoris: Secondary | ICD-10-CM | POA: Diagnosis present

## 2012-05-13 DIAGNOSIS — R9439 Abnormal result of other cardiovascular function study: Secondary | ICD-10-CM | POA: Diagnosis present

## 2012-05-13 DIAGNOSIS — R9431 Abnormal electrocardiogram [ECG] [EKG]: Secondary | ICD-10-CM | POA: Diagnosis present

## 2012-05-13 DIAGNOSIS — Z955 Presence of coronary angioplasty implant and graft: Secondary | ICD-10-CM

## 2012-05-13 DIAGNOSIS — Z95820 Peripheral vascular angioplasty status with implants and grafts: Secondary | ICD-10-CM

## 2012-05-13 HISTORY — DX: Essential (primary) hypertension: I10

## 2012-05-13 HISTORY — DX: Other forms of angina pectoris: I20.8

## 2012-05-13 HISTORY — DX: Atherosclerotic heart disease of native coronary artery without angina pectoris: I25.10

## 2012-05-13 HISTORY — DX: Peripheral vascular angioplasty status with implants and grafts: Z95.820

## 2012-05-13 HISTORY — DX: Abnormal result of other cardiovascular function study: R94.39

## 2012-05-13 HISTORY — DX: Abnormal electrocardiogram (ECG) (EKG): R94.31

## 2012-05-13 HISTORY — DX: Unspecified osteoarthritis, unspecified site: M19.90

## 2012-05-13 HISTORY — DX: Gastro-esophageal reflux disease without esophagitis: K21.9

## 2012-05-13 HISTORY — DX: Osteoarthritis of first carpometacarpal joint, unspecified: M18.9

## 2012-05-13 HISTORY — DX: Adverse effect of unspecified anesthetic, initial encounter: T41.45XA

## 2012-05-13 HISTORY — PX: CORONARY ANGIOPLASTY WITH STENT PLACEMENT: SHX49

## 2012-05-13 HISTORY — DX: Pure hypercholesterolemia, unspecified: E78.00

## 2012-05-13 HISTORY — PX: LEFT HEART CATHETERIZATION WITH CORONARY ANGIOGRAM: SHX5451

## 2012-05-13 LAB — POCT ACTIVATED CLOTTING TIME: Activated Clotting Time: 366 seconds

## 2012-05-13 SURGERY — LEFT HEART CATHETERIZATION WITH CORONARY ANGIOGRAM
Anesthesia: LOCAL

## 2012-05-13 MED ORDER — HEPARIN (PORCINE) IN NACL 2-0.9 UNIT/ML-% IJ SOLN
INTRAMUSCULAR | Status: AC
  Filled 2012-05-13: qty 1000

## 2012-05-13 MED ORDER — ACETAMINOPHEN 325 MG PO TABS: 650.0000 mg | ORAL_TABLET | ORAL | Status: DC | PRN

## 2012-05-13 MED ORDER — ONDANSETRON HCL 4 MG/2ML IJ SOLN: 4.0000 mg | Freq: Four times a day (QID) | INTRAMUSCULAR | Status: DC | PRN

## 2012-05-13 MED ORDER — SODIUM CHLORIDE 0.9 % IJ SOLN: 3.0000 mL | INTRAMUSCULAR | Status: DC | PRN

## 2012-05-13 MED ORDER — MORPHINE SULFATE 2 MG/ML IJ SOLN: 2.0000 mg | INTRAMUSCULAR | Status: DC | PRN

## 2012-05-13 MED ORDER — ADENOSINE 12 MG/4ML IV SOLN
12.0000 mL | Freq: Once | INTRAVENOUS | Status: DC
  Filled 2012-05-13: qty 12

## 2012-05-13 MED ORDER — ASPIRIN 81 MG PO CHEW
81.0000 mg | CHEWABLE_TABLET | Freq: Every day | ORAL | Status: DC
  Administered 2012-05-14: 81 mg via ORAL
  Filled 2012-05-13: qty 1

## 2012-05-13 MED ORDER — LOSARTAN POTASSIUM 50 MG PO TABS
50.0000 mg | ORAL_TABLET | Freq: Every day | ORAL | Status: DC
  Administered 2012-05-14: 12:00:00 50 mg via ORAL
  Filled 2012-05-13 (×2): qty 1

## 2012-05-13 MED ORDER — SODIUM CHLORIDE 0.9 % IV SOLN: INTRAVENOUS | Status: AC

## 2012-05-13 MED ORDER — SODIUM CHLORIDE 0.9 % IV SOLN
INTRAVENOUS | Status: DC
  Administered 2012-05-13: 14:00:00 via INTRAVENOUS

## 2012-05-13 MED ORDER — NITROGLYCERIN 0.2 MG/ML ON CALL CATH LAB
INTRAVENOUS | Status: AC
  Filled 2012-05-13: qty 1

## 2012-05-13 MED ORDER — NAPROXEN 500 MG PO TABS
500.0000 mg | ORAL_TABLET | Freq: Two times a day (BID) | ORAL | Status: DC
  Filled 2012-05-13 (×3): qty 1

## 2012-05-13 MED ORDER — TICAGRELOR 90 MG PO TABS
ORAL_TABLET | ORAL | Status: AC
  Filled 2012-05-13: qty 2

## 2012-05-13 MED ORDER — LIDOCAINE HCL (PF) 1 % IJ SOLN
INTRAMUSCULAR | Status: AC
  Filled 2012-05-13: qty 30

## 2012-05-13 MED ORDER — BIVALIRUDIN 250 MG IV SOLR
INTRAVENOUS | Status: AC
  Filled 2012-05-13: qty 250

## 2012-05-13 MED ORDER — FENTANYL CITRATE 0.05 MG/ML IJ SOLN
INTRAMUSCULAR | Status: AC
  Filled 2012-05-13: qty 2

## 2012-05-13 MED ORDER — SODIUM CHLORIDE 0.9 % IV SOLN
0.2500 mg/kg/h | INTRAVENOUS | Status: AC
  Filled 2012-05-13: qty 250

## 2012-05-13 MED ORDER — ASPIRIN 81 MG PO CHEW
CHEWABLE_TABLET | ORAL | Status: AC
  Filled 2012-05-13: qty 4

## 2012-05-13 MED ORDER — ASPIRIN 81 MG PO TABS: 81.0000 mg | ORAL_TABLET | Freq: Every day | ORAL | Status: DC

## 2012-05-13 MED ORDER — DIAZEPAM 5 MG PO TABS
5.0000 mg | ORAL_TABLET | ORAL | Status: AC
  Administered 2012-05-13: 5 mg via ORAL
  Filled 2012-05-13: qty 1

## 2012-05-13 NOTE — Progress Notes (Signed)
Site area: right groin  Site Prior to Removal:  Level 0  Pressure Applied For 25 MINUTES    Minutes Beginning at 2140  Manual:   yes  Patient Status During Pull:  stable  Post Pull Groin Site:  Level 0  Post Pull Instructions Given:  yes  Post Pull Pulses Present:  yes  Dressing Applied:  yes  Comments:  Gauze pressure dressing secured with medipore tape applied

## 2012-05-13 NOTE — H&P (Signed)
  H & P will be scanned in.  Pt was reexamined and existing H & P reviewed. No changes found.  Runell Gess, MD [facility] 05/13/2012 4:16 PM

## 2012-05-13 NOTE — Op Note (Signed)
Brittany Figueroa is a 71 y.o. female    [id number] LOCATION:  FACILITY: [facility]  PHYSICIAN: Nanetta Batty, M.D. [birth year]   DATE OF PROCEDURE:  05/13/2012  DATE OF DISCHARGE:  [facility]  CARDIAC CATHETERIZATION     History obtained from chart review.  Brittany Figueroa is a 71 year old thin appearing divorced Caucasian female mother of 2, grandmother took her grandchildren who has a history of hypertension and hyperlipidemia. She was recently noted to have new anterolateral T-wave inversion during routine office visit. Myoview stress test showed infero-lateral/nasal ischemia compared to a prior study. She complains of a typical symptoms of "feeling weird" but denies chest pain. She presents now for diagnostic coronary arteriography to define her anatomy and rule out an ischemic abnormality.   PROCEDURE DESCRIPTION:    The patient was brought to the second floor   Cardiac cath lab in the postabsorptive state. She was premedicated with Valium 5 mg by mouth. Her right groin was prepped and shaved in usual sterile fashion. Xylocaine 1% was used for local anesthesia. A 6 French sheath was inserted into the right common femoral artery using standard Seldinger technique. 5 French right and left Judkins diagnostic catheters a 5 French pigtail catheter were used for selective coronary angiography, subselective left internal mammary artery angiography and left ventriculography. Visipaque dye was used for the entirety of the case. Retrograde aorta, left ventricular and pullback pressures were recorded.   HEMODYNAMICS:    AO SYSTOLIC/AO DIASTOLIC: 159/68   LV SYSTOLIC/LV DIASTOLIC: 172/13 there was no obvious pullback gradient noted  ANGIOGRAPHIC RESULTS:   1. Left main; normal  2. LAD; 80% short concentric proximal 3. Left circumflex; non-dominant and normal.  4. Right coronary artery; totally occluded proximally with faint left-to-right collaterals 5.LIMA was  subselectively visualized and was widely patent. It was suitable for use during your artery bypass grafting if necessary 6. Left ventriculography; RAO left ventriculogram was performed using  25 mL of Visipaque dye at 12 mL/second. The overall LVEF estimated  60 % Without wall motion abnormalities  IMPRESSION:Brittany Figueroa has a total 71 RCA with a moderately high-grade focal proximal LAD stenosis and faint left-to-right collaterals. Will proceed with FFR guided PCI and stenting with drug-eluting stents.  Procedure description: The patient received 4 baby aspirin, Brilenta to 180 mg by mouth and Angiomax bolus and ACT documented at 336. Total contrast administered the patient was 115 cc . Using a 6 [place] XB LAD 3.0 cm guide catheter along with an FFR wire fractional flow reserve was performed. The result was 0.75 suggesting physiologic significance. Following this direct stenting was performed with a 3.0 mm x 12 mm long X pedition juggling stent at 12 atmospheres.post dilatation was performed with a 3.25 mm x 8 mm long Tranquillity track at 16 atmospheres (3.36 mm) resulting in reduction of an 80% concentric proximal LAD stenosis to 0% residual with excellent flow and no dissection. The patient tolerated the procedure well. The guiding wire and catheter were removed and the sheath was sewn securely in place.  Final impression: Successful FFR guided PCI and stenting of high-grade proximal LAD stenosis with expedition drug-eluting stent, Angiomax, and Brilenta. The sheath will be removed in several hours. Intraductal continue at .25 mg per minute for 2 hours. She'll be discharged on the morning will followup with me in the 1-2 weeks. She left the Cath Lab in stable condition.

## 2012-05-14 ENCOUNTER — Encounter (HOSPITAL_COMMUNITY): Payer: Self-pay | Admitting: Cardiology

## 2012-05-14 DIAGNOSIS — R9430 Abnormal result of cardiovascular function study, unspecified: Secondary | ICD-10-CM | POA: Diagnosis not present

## 2012-05-14 DIAGNOSIS — E785 Hyperlipidemia, unspecified: Secondary | ICD-10-CM | POA: Diagnosis not present

## 2012-05-14 DIAGNOSIS — I2510 Atherosclerotic heart disease of native coronary artery without angina pectoris: Secondary | ICD-10-CM | POA: Diagnosis not present

## 2012-05-14 DIAGNOSIS — R9431 Abnormal electrocardiogram [ECG] [EKG]: Secondary | ICD-10-CM | POA: Diagnosis present

## 2012-05-14 DIAGNOSIS — I10 Essential (primary) hypertension: Secondary | ICD-10-CM | POA: Diagnosis present

## 2012-05-14 DIAGNOSIS — I2089 Other forms of angina pectoris: Secondary | ICD-10-CM

## 2012-05-14 DIAGNOSIS — Z95820 Peripheral vascular angioplasty status with implants and grafts: Secondary | ICD-10-CM

## 2012-05-14 DIAGNOSIS — I208 Other forms of angina pectoris: Secondary | ICD-10-CM

## 2012-05-14 DIAGNOSIS — R9439 Abnormal result of other cardiovascular function study: Secondary | ICD-10-CM | POA: Diagnosis present

## 2012-05-14 DIAGNOSIS — I209 Angina pectoris, unspecified: Secondary | ICD-10-CM | POA: Diagnosis not present

## 2012-05-14 HISTORY — DX: Atherosclerotic heart disease of native coronary artery without angina pectoris: I25.10

## 2012-05-14 HISTORY — DX: Abnormal result of other cardiovascular function study: R94.39

## 2012-05-14 HISTORY — DX: Abnormal electrocardiogram (ECG) (EKG): R94.31

## 2012-05-14 HISTORY — DX: Other forms of angina pectoris: I20.89

## 2012-05-14 HISTORY — DX: Other forms of angina pectoris: I20.8

## 2012-05-14 HISTORY — DX: Peripheral vascular angioplasty status with implants and grafts: Z95.820

## 2012-05-14 LAB — CBC
HCT: 33.9 % — ABNORMAL LOW (ref 36.0–46.0)
Hemoglobin: 11.5 g/dL — ABNORMAL LOW (ref 12.0–15.0)
MCH: 30.8 pg (ref 26.0–34.0)
MCHC: 33.9 g/dL (ref 30.0–36.0)
MCV: 90.9 fL (ref 78.0–100.0)
Platelets: 284 10*3/uL (ref 150–400)
RBC: 3.73 MIL/uL — ABNORMAL LOW (ref 3.87–5.11)
RDW: 13.4 % (ref 11.5–15.5)
WBC: 5.5 10*3/uL (ref 4.0–10.5)

## 2012-05-14 LAB — BASIC METABOLIC PANEL
BUN: 13 mg/dL (ref 6–23)
CO2: 26 mEq/L (ref 19–32)
Calcium: 8.6 mg/dL (ref 8.4–10.5)
Chloride: 103 mEq/L (ref 96–112)
Creatinine, Ser: 0.57 mg/dL (ref 0.50–1.10)
GFR calc Af Amer: >90 mL/min (ref >90)
GFR calc non Af Amer: >90 mL/min (ref >90)
Glucose, Bld: 167 mg/dL — ABNORMAL HIGH (ref 70–99)
Potassium: 3.6 mEq/L (ref 3.5–5.1)
Sodium: 136 mEq/L (ref 135–145)

## 2012-05-14 MED ORDER — EZETIMIBE 10 MG PO TABS
10.0000 mg | ORAL_TABLET | Freq: Every day | ORAL | Status: DC
  Administered 2012-05-14: 10 mg via ORAL
  Filled 2012-05-14: qty 1

## 2012-05-14 MED ORDER — METOPROLOL SUCCINATE ER 25 MG PO TB24: 12.5000 mg | ORAL_TABLET | Freq: Every day | ORAL | Status: DC

## 2012-05-14 MED ORDER — TICAGRELOR 90 MG PO TABS: 90.0000 mg | ORAL_TABLET | Freq: Two times a day (BID) | ORAL | Status: DC

## 2012-05-14 MED ORDER — TICAGRELOR 90 MG PO TABS
90.0000 mg | ORAL_TABLET | Freq: Two times a day (BID) | ORAL | Status: DC
  Administered 2012-05-14: 90 mg via ORAL
  Filled 2012-05-14 (×2): qty 1

## 2012-05-14 MED ORDER — EZETIMIBE 10 MG PO TABS: 10.0000 mg | ORAL_TABLET | Freq: Every day | ORAL | Status: DC

## 2012-05-14 MED ORDER — NITROGLYCERIN 0.4 MG SL SUBL: 0.4000 mg | SUBLINGUAL_TABLET | SUBLINGUAL | Status: DC | PRN

## 2012-05-14 MED ORDER — METOPROLOL SUCCINATE 12.5 MG HALF TABLET
12.5000 mg | ORAL_TABLET | Freq: Every day | ORAL | Status: DC
  Administered 2012-05-14: 12:00:00 12.5 mg via ORAL
  Filled 2012-05-14: qty 1

## 2012-05-14 MED FILL — Dextrose Inj 5%: INTRAVENOUS | Qty: 50 | Status: AC

## 2012-05-14 NOTE — Care Management Note (Signed)
    Page 1 of 1   05/14/2012     4:54:20 PM   CARE MANAGEMENT NOTE 05/14/2012  Patient:  Brittany Figueroa, Brittany Figueroa   Account Number:  [id number]  Date Initiated:  05/14/2012  Documentation initiated by:  Tera Mater  Subjective/Objective Assessment:   71yo female admitted with Chest Pain     Action/Plan:   discharge planning and medication assistance   Anticipated DC Date:  05/14/2012   Anticipated DC Plan:  HOME/SELF CARE      DC Planning Services  CM consult  Medication Assistance      Choice offered to / List presented to:             Status of service:  Completed, signed off Medicare Important Message given?   (If response is "NO", the following Medicare IM given date fields will be blank) Date Medicare IM given:   Date Additional Medicare IM given:    Discharge Disposition:  HOME/SELF CARE  Per UR Regulation:  Reviewed for med. necessity/level of care/duration of stay  If discussed at Long Length of Stay Meetings, dates discussed:    Comments:  05/14/12 1430 Received information about co-pay for Brilinta for pt.  TC to pt. home ([phone]) and left VM about the $35 co-pay for Brilinta.  Pt. dc home today. Tera Mater, RN, BSN NCM [phone]

## 2012-05-14 NOTE — Progress Notes (Signed)
CARDIAC REHAB PHASE I   PRE:  Rate/Rhythm: 65 SR    BP: sitting 122/64    SaO2: 98 RA  MODE:  Ambulation: 600 ft   POST:  Rate/Rhythm: 90    BP: sitting 139/72     SaO2:   Tolerated well. No c/o. Ed completed and pt interested in [facility]. Will send referral to [place] [facility].  [phone]  Harriet Masson CES, ACSM

## 2012-05-14 NOTE — Discharge Summary (Signed)
Physician Discharge Summary  Patient ID: Brittany Figueroa MRN: [id number] DOB/AGE: Nov 16, 1940 71 y.o.  Admit date: 05/13/2012 Discharge date: 05/14/2012  Admission Diagnoses: Abnormal NST, w/ infero-lateral ischemia   Discharge Diagnoses:  Principal Problem:  *Abnormal nuclear stress test, with infero-lateral ischemia Active Problems:  CAD (coronary artery disease), 05/13/12, with 80% LAD and totally occluded RCA  Abnormal finding on EKG, new anterolateral T-wave inversions   Atypical angina  S/P angioplasty with stent, LAD 05/13/12  Hyperlipidemia  HTN (hypertension)   Discharged Condition: stable  Hospital Course: Brittany Figueroa is a 71 year old thin appearing divorced Caucasian female, who has a history of hypertension and hyperlipidemia, who was admitted on 12/26 to undergo diagnostic coronary arteriography to define her anatomy and rule out an ischemic abnormality after an abnormal OP NST, that suggested infero-lateral ischemia, compared to a prior study. The procedure was performed by Dr. [name], who is also her primary cardiologist. The cath revealed high grade proximal LAD stenosis. She underwent successful FFR guided PCI and stenting to the proximal LAD with a DES and left the Cath Lab in stable condition. On POD # 1, she had no recurrent chest discomfort.  She was last seen and examined by Dr. Tresa Endo, who felt that she was stable for discharge home. A low dose beta blocker, 12.5 mg Toprol, as well as 10 mg of Zetia and 90 mg of Brilinta BID were added to her home regimen, which already included 81 mg ASA, 5 mg of Crestor and 50 mg of Losartan. OP follow-up has been arranged with Dr. [name] on 05/24/12.  PT HAS NOT TOLERATED STATINS IN THE PAST AND IS CURRENTLY ON 5 MG CRESTOR WEEKLY.  Continue medical therapy for here totally occluded RCA, and collaterals.  Consults: None  PROCEDURES: LHC 12/26 HEMODYNAMICS:  AO SYSTOLIC/AO DIASTOLIC: 159/68  LV SYSTOLIC/LV DIASTOLIC: 172/13 there was no  obvious pullback gradient noted  ANGIOGRAPHIC RESULTS:  1. Left main; normal  2. LAD; 80% short concentric proximal  3. Left circumflex; non-dominant and normal.  4. Right coronary artery; totally occluded proximally with faint left-to-right collaterals  5.LIMA was subselectively visualized and was widely patent. It was suitable for use during your artery bypass grafting if necessary  6. Left ventriculography; RAO left ventriculogram was performed using  25 mL of Visipaque dye at 12 mL/second. The overall LVEF estimated  60 % Without wall motion abnormalities  IMPRESSION:Brittany Figueroa has a total RCA with a moderately high-grade focal proximal LAD stenosis and faint left-to-right collaterals. Will proceed with FFR guided PCI and stenting with drug-eluting stents.  Procedure description: The patient received 4 baby aspirin, Brilenta to 180 mg by mouth and Angiomax bolus and ACT documented at 336.  Final impression: Successful FFR guided PCI and stenting of high-grade proximal LAD stenosis with expedition drug-eluting stent, Angiomax, and Brilenta.     Discharge Exam: Blood pressure 139/72, pulse 75, temperature 97.5 F (36.4 C), temperature source Oral, resp. rate 17, height 5' 4.75" (1.645 m), weight 70.3 kg (154 lb 15.7 oz), SpO2 97.00%.  PE: General:alert and oriented, no chest pain  No JVD  Heart:S1S2 RRR  Lungs:clear  Abd:+ BS, soft, non tender  Ext:Rt. Groin cath site stable, + pedal pulse  Disposition: HOME    Medication List     As of 05/14/2012 11:55 AM    STOP taking these medications         naproxen 500 MG tablet   Commonly known as: NAPROSYN      TAKE these medications  aspirin 81 MG tablet   Take 81 mg by mouth daily.      calcium-vitamin D 500-200 MG-UNIT per tablet   Commonly known as: OSCAL WITH D   Take 2 tablets by mouth daily.      diclofenac sodium 1 % Gel   Commonly known as: VOLTAREN   Apply 2 g topically daily as needed. For pain       ezetimibe 10 MG tablet   Commonly known as: ZETIA   Take 1 tablet (10 mg total) by mouth daily.      losartan 50 MG tablet   Commonly known as: COZAAR   Take 50 mg by mouth daily.      metoprolol succinate 25 MG 24 hr tablet   Commonly known as: TOPROL-XL   Take 0.5 tablets (12.5 mg total) by mouth daily.      multivitamin with minerals Tabs   Take 1 tablet by mouth daily.      nitroGLYCERIN 0.4 MG SL tablet   Commonly known as: NITROSTAT   Place 1 tablet (0.4 mg total) under the tongue every 5 (five) minutes as needed for chest pain.      rosuvastatin 5 MG tablet   Commonly known as: CRESTOR   Take 5 mg by mouth once a week.      Ticagrelor 90 MG Tabs tablet   Commonly known as: BRILINTA   Take 1 tablet (90 mg total) by mouth 2 (two) times daily.      Vitamin D3 2000 UNITS Tabs   Take 2,000 Units by mouth 2 (two) times daily.           Follow-up Information    Follow up with Runell Gess, MD. On 05/24/2012. (at 2:15 pm)    Contact information:   [address] [place] [address] [phone]         DISCHARGE INSTRUCTIONS: Heart Healthy Diet No driving for 2 days No lifting over 5 pounds for 3 days  We added Toprol to your meds.  Small dose to help your heart.   We also added Zetia as per Dr. Tresa Endo  Signed: Nada Boozer R 05/14/2012, 11:55 AM

## 2012-05-14 NOTE — Progress Notes (Signed)
Subjective: Brittany Figueroa Figueroa is a 71 year old thin appearing divorced Caucasian female mother of 2, grandmother took her grandchildren who has a history of hypertension and hyperlipidemia. She was recently noted to have new anterolateral T-wave inversion during routine office visit. Myoview stress test showed infero-lateral/nasal ischemia compared to a prior study. She complains of a typical symptoms of "feeling weird" but denies chest pain. She presents now for diagnostic coronary arteriography to define her anatomy and rule out an ischemic abnormality.   No complaints, many questions Objective: Vital signs in last 24 hours: Temp:  [97.5 F (36.4 C)-98 F (36.7 C)] 97.5 F (36.4 C) (12/27 0814) Pulse Rate:  [66-82] 75  (12/27 0814) Resp:  [13-21] 17  (12/27 0814) BP: (98-146)/(42-93) 139/72 mmHg (12/27 0814) SpO2:  [96 %-99 %] 97 % (12/27 0814) Weight:  [62.143 kg (137 lb)-70.3 kg (154 lb 15.7 oz)] 70.3 kg (154 lb 15.7 oz) (12/27 0525) Weight change:  Last BM Date: 05/13/12 Intake/Output from previous day: not accurate 12/26 0701 - 12/27 0700 In: 3.1 [I.V.:3.1] Out: -  Intake/Output this shift:    PE: General:alert and oriented, no chest pain No JVD Heart:S1S2 RRR Lungs:clear Abd:+ BS, soft, non tender Ext:Rt. Groin cath site stable, + pedal pulse    Lab Results:  Basename 05/14/12 0600  WBC 5.5  HGB 11.5*  HCT 33.9*  PLT 284   BMET  Basename 05/14/12 0600  NA 136  K 3.6  CL 103  CO2 26  GLUCOSE 167*  BUN 13  CREATININE 0.57  CALCIUM 8.6    Studies/Results: Cardiac cath: LAD; 80% short concentric proximal The overall LVEF estimated  60 % Without wall motion abnormalities Successful FFR guided PCI and stenting of high-grade proximal LAD stenosis with expedition drug-eluting stent, Angiomax, and Brilenta   Medications: I have reviewed the patient's current medications.    Marland Kitchen adenosine  12 mL Intravenous Once  . aspirin  81 mg Oral Daily  . losartan  50 mg Oral  Daily  . naproxen  500 mg Oral BID WC   Assessment/Plan: Principal Problem:  *Abnormal nuclear stress test, with infero-lateral ischemia Active Problems:  CAD (coronary artery disease), 05/13/12, with 80% LAD  Abnormal finding on EKG, new anterolateral T-wave inversions   Atypical angina  S/P angioplasty with stent, LAD 05/13/12  Hyperlipidemia  HTN (hypertension)  PLAN: Ambulate and discharge home. Will need to add small dose of BB with CAD, Brilinta currently not on med list will add and have case manager to assist financial issues. Glucose elevated at 167-  Continue home Crestor --Dr. Jason Fila- pharmacist is slowly increasing. Follow up glucose with PCP.   LOS: 1 day   INGOLD,LAURA R 05/14/2012, 8:34 AM    Patient seen and examined. Agree with assessment and plan. No recurrent chest discomfort.  Long discussion with patient regarding multiple pat medical issues. Would avoid naprosyn for now. Add zetia 10 mg to Crestor 5 mg. DC today and f/u Dr. [name].    [name], MD, [facility] 05/14/2012 10:02 AM

## 2012-05-14 NOTE — Progress Notes (Signed)
Utilization review completed.  P.J. Jamye Balicki,RN,BSN Case Manager [phone]  

## 2012-05-18 DIAGNOSIS — L259 Unspecified contact dermatitis, unspecified cause: Secondary | ICD-10-CM | POA: Diagnosis not present

## 2012-05-18 DIAGNOSIS — D485 Neoplasm of uncertain behavior of skin: Secondary | ICD-10-CM | POA: Diagnosis not present

## 2012-05-21 DIAGNOSIS — M545 Low back pain, unspecified: Secondary | ICD-10-CM | POA: Diagnosis not present

## 2012-05-21 DIAGNOSIS — M76899 Other specified enthesopathies of unspecified lower limb, excluding foot: Secondary | ICD-10-CM | POA: Diagnosis not present

## 2012-05-24 DIAGNOSIS — I2510 Atherosclerotic heart disease of native coronary artery without angina pectoris: Secondary | ICD-10-CM | POA: Diagnosis not present

## 2012-05-24 DIAGNOSIS — E782 Mixed hyperlipidemia: Secondary | ICD-10-CM | POA: Diagnosis not present

## 2012-05-24 DIAGNOSIS — I10 Essential (primary) hypertension: Secondary | ICD-10-CM | POA: Diagnosis not present

## 2012-06-02 DIAGNOSIS — R9431 Abnormal electrocardiogram [ECG] [EKG]: Secondary | ICD-10-CM | POA: Diagnosis not present

## 2012-06-02 DIAGNOSIS — M67919 Unspecified disorder of synovium and tendon, unspecified shoulder: Secondary | ICD-10-CM | POA: Diagnosis not present

## 2012-06-02 DIAGNOSIS — M25519 Pain in unspecified shoulder: Secondary | ICD-10-CM | POA: Diagnosis not present

## 2012-06-02 DIAGNOSIS — I2510 Atherosclerotic heart disease of native coronary artery without angina pectoris: Secondary | ICD-10-CM | POA: Diagnosis not present

## 2012-06-02 DIAGNOSIS — R079 Chest pain, unspecified: Secondary | ICD-10-CM | POA: Diagnosis not present

## 2012-06-08 DIAGNOSIS — M67919 Unspecified disorder of synovium and tendon, unspecified shoulder: Secondary | ICD-10-CM | POA: Diagnosis not present

## 2012-06-08 DIAGNOSIS — M25519 Pain in unspecified shoulder: Secondary | ICD-10-CM | POA: Diagnosis not present

## 2012-06-10 DIAGNOSIS — M25519 Pain in unspecified shoulder: Secondary | ICD-10-CM | POA: Diagnosis not present

## 2012-06-10 DIAGNOSIS — Q7649 Other congenital malformations of spine, not associated with scoliosis: Secondary | ICD-10-CM | POA: Diagnosis not present

## 2012-06-10 DIAGNOSIS — M719 Bursopathy, unspecified: Secondary | ICD-10-CM | POA: Diagnosis not present

## 2012-06-10 DIAGNOSIS — M542 Cervicalgia: Secondary | ICD-10-CM | POA: Diagnosis not present

## 2012-06-10 DIAGNOSIS — M67919 Unspecified disorder of synovium and tendon, unspecified shoulder: Secondary | ICD-10-CM | POA: Diagnosis not present

## 2012-06-15 DIAGNOSIS — Q7649 Other congenital malformations of spine, not associated with scoliosis: Secondary | ICD-10-CM | POA: Diagnosis not present

## 2012-06-15 DIAGNOSIS — M542 Cervicalgia: Secondary | ICD-10-CM | POA: Diagnosis not present

## 2012-06-15 DIAGNOSIS — M25519 Pain in unspecified shoulder: Secondary | ICD-10-CM | POA: Diagnosis not present

## 2012-06-15 DIAGNOSIS — M67919 Unspecified disorder of synovium and tendon, unspecified shoulder: Secondary | ICD-10-CM | POA: Diagnosis not present

## 2012-06-17 ENCOUNTER — Encounter (HOSPITAL_COMMUNITY)
Admission: RE | Admit: 2012-06-17 | Discharge: 2012-06-17 | Disposition: A | Discharge status: Home / Self Care | Payer: Medicare Other | Source: Ambulatory Visit | Attending: Cardiovascular Disease | Admitting: Cardiovascular Disease

## 2012-06-17 NOTE — Progress Notes (Signed)
Cardiac Rehab Medication Review by a Pharmacist  Does the patient  feel that his/her medications are working for him/her?  yes  Has the patient been experiencing any side effects to the medications prescribed?  no  Does the patient measure his/her own blood pressure or blood glucose at home?  yes   Does the patient have any problems obtaining medications due to transportation or finances?   no  Understanding of regimen: excellent Understanding of indications: excellent Potential of compliance: excellent    Pharmacist comments: none    Laurence Slate 06/17/2012 8:48 AM

## 2012-06-21 ENCOUNTER — Encounter (HOSPITAL_COMMUNITY)
Admission: RE | Admit: 2012-06-21 | Discharge: 2012-06-21 | Disposition: A | Discharge status: Home / Self Care | Payer: Medicare Other | Source: Ambulatory Visit | Attending: Cardiovascular Disease | Admitting: Cardiovascular Disease

## 2012-06-21 DIAGNOSIS — Z5189 Encounter for other specified aftercare: Secondary | ICD-10-CM | POA: Diagnosis not present

## 2012-06-21 DIAGNOSIS — I2510 Atherosclerotic heart disease of native coronary artery without angina pectoris: Secondary | ICD-10-CM | POA: Diagnosis not present

## 2012-06-21 DIAGNOSIS — I10 Essential (primary) hypertension: Secondary | ICD-10-CM | POA: Insufficient documentation

## 2012-06-22 NOTE — Progress Notes (Signed)
Pt started cardiac rehab today.  Pt tolerated light exercise without difficulty. Telemetry rhythm Sinus. Vital signs stable. Will continue to monitor the patient throughout  the program.  

## 2012-06-23 ENCOUNTER — Encounter (HOSPITAL_COMMUNITY)
Admission: RE | Admit: 2012-06-23 | Discharge: 2012-06-23 | Disposition: A | Discharge status: Home / Self Care | Payer: Medicare Other | Source: Ambulatory Visit | Attending: Cardiovascular Disease | Admitting: Cardiovascular Disease

## 2012-06-25 ENCOUNTER — Encounter (HOSPITAL_COMMUNITY)
Admission: RE | Admit: 2012-06-25 | Discharge: 2012-06-25 | Disposition: A | Discharge status: Home / Self Care | Payer: Medicare Other | Source: Ambulatory Visit | Attending: Cardiovascular Disease | Admitting: Cardiovascular Disease

## 2012-06-28 ENCOUNTER — Encounter (HOSPITAL_COMMUNITY)
Admission: RE | Admit: 2012-06-28 | Discharge: 2012-06-28 | Disposition: A | Discharge status: Home / Self Care | Payer: Medicare Other | Source: Ambulatory Visit | Attending: Cardiovascular Disease | Admitting: Cardiovascular Disease

## 2012-06-28 NOTE — Progress Notes (Signed)
Brittany Figueroa reports having intermittent noose bleeds since being on brilinta.  Dr Hazle Coca office called and notified of patients complaints.  Brittany Figueroa did not have any problems this morning with exercise today. Brittany Figueroa reported having a nose bleed prior to coming to exercise to cardiac rehab this morning.

## 2012-06-28 NOTE — Progress Notes (Signed)
Reviewed home exercise with pt today.  Pt plans to walk at home and go to [facility] for exercise.  Reviewed THR, pulse, RPE, sign and symptoms, NTG use, and when to call 911 or MD.  Pt voiced understanding. Fabio Pierce, MA, ACSM RCEP

## 2012-06-30 ENCOUNTER — Encounter (HOSPITAL_COMMUNITY)
Admission: RE | Admit: 2012-06-30 | Discharge: 2012-06-30 | Disposition: A | Discharge status: Home / Self Care | Payer: Medicare Other | Source: Ambulatory Visit | Attending: Cardiovascular Disease | Admitting: Cardiovascular Disease

## 2012-07-02 ENCOUNTER — Encounter (HOSPITAL_COMMUNITY): Payer: Medicare Other

## 2012-07-05 ENCOUNTER — Encounter (HOSPITAL_COMMUNITY)
Admission: RE | Admit: 2012-07-05 | Discharge: 2012-07-05 | Disposition: A | Discharge status: Home / Self Care | Payer: Medicare Other | Source: Ambulatory Visit | Attending: Cardiovascular Disease | Admitting: Cardiovascular Disease

## 2012-07-06 DIAGNOSIS — E785 Hyperlipidemia, unspecified: Secondary | ICD-10-CM | POA: Diagnosis not present

## 2012-07-06 DIAGNOSIS — R7301 Impaired fasting glucose: Secondary | ICD-10-CM | POA: Diagnosis not present

## 2012-07-07 ENCOUNTER — Encounter (HOSPITAL_COMMUNITY)
Admission: RE | Admit: 2012-07-07 | Discharge: 2012-07-07 | Disposition: A | Discharge status: Home / Self Care | Payer: Medicare Other | Source: Ambulatory Visit | Attending: Cardiovascular Disease | Admitting: Cardiovascular Disease

## 2012-07-09 ENCOUNTER — Encounter (HOSPITAL_COMMUNITY): Payer: Medicare Other

## 2012-07-12 ENCOUNTER — Encounter (HOSPITAL_COMMUNITY): Payer: Medicare Other

## 2012-07-13 DIAGNOSIS — I10 Essential (primary) hypertension: Secondary | ICD-10-CM | POA: Diagnosis not present

## 2012-07-13 DIAGNOSIS — E785 Hyperlipidemia, unspecified: Secondary | ICD-10-CM | POA: Diagnosis not present

## 2012-07-13 DIAGNOSIS — R7301 Impaired fasting glucose: Secondary | ICD-10-CM | POA: Diagnosis not present

## 2012-07-13 DIAGNOSIS — I2510 Atherosclerotic heart disease of native coronary artery without angina pectoris: Secondary | ICD-10-CM | POA: Diagnosis not present

## 2012-07-14 ENCOUNTER — Encounter (HOSPITAL_COMMUNITY)
Admission: RE | Admit: 2012-07-14 | Discharge: 2012-07-14 | Disposition: A | Discharge status: Home / Self Care | Payer: Medicare Other | Source: Ambulatory Visit | Attending: Cardiovascular Disease | Admitting: Cardiovascular Disease

## 2012-07-16 ENCOUNTER — Encounter (HOSPITAL_COMMUNITY)
Admission: RE | Admit: 2012-07-16 | Discharge: 2012-07-16 | Disposition: A | Discharge status: Home / Self Care | Payer: Medicare Other | Source: Ambulatory Visit | Attending: Cardiovascular Disease | Admitting: Cardiovascular Disease

## 2012-07-19 ENCOUNTER — Encounter (HOSPITAL_COMMUNITY)
Admission: RE | Admit: 2012-07-19 | Discharge: 2012-07-19 | Disposition: A | Discharge status: Home / Self Care | Payer: Medicare Other | Source: Ambulatory Visit | Attending: Cardiovascular Disease | Admitting: Cardiovascular Disease

## 2012-07-19 DIAGNOSIS — Z5189 Encounter for other specified aftercare: Secondary | ICD-10-CM | POA: Insufficient documentation

## 2012-07-19 DIAGNOSIS — I10 Essential (primary) hypertension: Secondary | ICD-10-CM | POA: Diagnosis not present

## 2012-07-19 DIAGNOSIS — E785 Hyperlipidemia, unspecified: Secondary | ICD-10-CM | POA: Insufficient documentation

## 2012-07-19 DIAGNOSIS — I209 Angina pectoris, unspecified: Secondary | ICD-10-CM | POA: Insufficient documentation

## 2012-07-19 DIAGNOSIS — I2510 Atherosclerotic heart disease of native coronary artery without angina pectoris: Secondary | ICD-10-CM | POA: Diagnosis not present

## 2012-07-19 NOTE — Progress Notes (Signed)
Brittany Figueroa 72 y.o. female Nutrition Note Spoke with pt.  Nutrition Plan and Nutrition Survey goals reviewed with pt. Pt is following Step 2 of the Therapeutic Lifestyle Changes diet. Pt wants to lose wt. Pt has been trying to lose wt by eating more vegetables and watching portion sizes. Wt loss tips reviewed. Pt expressed understanding of the information reviewed. Pt aware of nutrition education classes offered and has attended the Nutrition I and II classes.  Nutrition Diagnosis   Food-and nutrition-related knowledge deficit related to lack of exposure to information as related to diagnosis of: ? CVD   Overweight related to excessive energy intake as evidenced by a BMI of 26.4  Nutrition RX/ Estimated Daily Nutrition Needs for: wt loss  1200 Kcal, 30 gm fat, 9 gm sat fat, 1.2 gm trans-fat, <1500 mg sodium   Nutrition Intervention   Pt's individual nutrition plan including cholesterol goals reviewed with pt.   Benefits of adopting Therapeutic Lifestyle Changes discussed when Medficts reviewed.   Pt to attend the Portion Distortion class   Pt to attend the  ? Nutrition I class - met 06/22/12                    ? Nutrition II class - 06/29/12   Continue client-centered nutrition education by RD, as part of interdisciplinary care.  Goal(s)   Pt to identify food quantities necessary to achieve: ? wt loss to a goal wt of 120-132 lb (54.5-59.9 kg) at graduation from cardiac rehab.   Monitor and Evaluate progress toward nutrition goal with team. Nutrition Risk:  Low

## 2012-07-21 ENCOUNTER — Encounter (HOSPITAL_COMMUNITY)
Admission: RE | Admit: 2012-07-21 | Discharge: 2012-07-21 | Disposition: A | Discharge status: Home / Self Care | Payer: Medicare Other | Source: Ambulatory Visit | Attending: Cardiovascular Disease | Admitting: Cardiovascular Disease

## 2012-07-21 NOTE — Progress Notes (Signed)
Brittany Figueroa says she is having some muscle aches that may be related to her statin.  [name] plans to call Dr Hazle Coca office to report this. Brittany Figueroa says she has an intolerance to statins.

## 2012-07-23 ENCOUNTER — Encounter (HOSPITAL_COMMUNITY): Payer: Medicare Other

## 2012-07-26 ENCOUNTER — Encounter (HOSPITAL_COMMUNITY)
Admission: RE | Admit: 2012-07-26 | Discharge: 2012-07-26 | Disposition: A | Discharge status: Home / Self Care | Payer: Medicare Other | Source: Ambulatory Visit | Attending: Cardiovascular Disease | Admitting: Cardiovascular Disease

## 2012-07-28 ENCOUNTER — Encounter (HOSPITAL_COMMUNITY)
Admission: RE | Admit: 2012-07-28 | Discharge: 2012-07-28 | Disposition: A | Discharge status: Home / Self Care | Payer: Medicare Other | Source: Ambulatory Visit | Attending: Cardiovascular Disease | Admitting: Cardiovascular Disease

## 2012-07-30 ENCOUNTER — Encounter (HOSPITAL_COMMUNITY): Payer: Medicare Other

## 2012-08-02 ENCOUNTER — Encounter (HOSPITAL_COMMUNITY)
Admission: RE | Admit: 2012-08-02 | Discharge: 2012-08-02 | Disposition: A | Discharge status: Home / Self Care | Payer: Medicare Other | Source: Ambulatory Visit | Attending: Cardiovascular Disease | Admitting: Cardiovascular Disease

## 2012-08-02 DIAGNOSIS — M5030 Other cervical disc degeneration, unspecified cervical region: Secondary | ICD-10-CM | POA: Diagnosis not present

## 2012-08-02 DIAGNOSIS — M9981 Other biomechanical lesions of cervical region: Secondary | ICD-10-CM | POA: Diagnosis not present

## 2012-08-04 ENCOUNTER — Encounter (HOSPITAL_COMMUNITY)
Admission: RE | Admit: 2012-08-04 | Discharge: 2012-08-04 | Disposition: A | Discharge status: Home / Self Care | Payer: Medicare Other | Source: Ambulatory Visit | Attending: Cardiovascular Disease | Admitting: Cardiovascular Disease

## 2012-08-04 DIAGNOSIS — M9981 Other biomechanical lesions of cervical region: Secondary | ICD-10-CM | POA: Diagnosis not present

## 2012-08-04 DIAGNOSIS — M5030 Other cervical disc degeneration, unspecified cervical region: Secondary | ICD-10-CM | POA: Diagnosis not present

## 2012-08-06 ENCOUNTER — Encounter (HOSPITAL_COMMUNITY)
Admission: RE | Admit: 2012-08-06 | Discharge: 2012-08-06 | Disposition: A | Discharge status: Home / Self Care | Payer: Medicare Other | Source: Ambulatory Visit | Attending: Cardiovascular Disease | Admitting: Cardiovascular Disease

## 2012-08-09 ENCOUNTER — Encounter (HOSPITAL_COMMUNITY)
Admission: RE | Admit: 2012-08-09 | Discharge: 2012-08-09 | Disposition: A | Discharge status: Home / Self Care | Payer: Medicare Other | Source: Ambulatory Visit | Attending: Cardiovascular Disease | Admitting: Cardiovascular Disease

## 2012-08-09 DIAGNOSIS — M9981 Other biomechanical lesions of cervical region: Secondary | ICD-10-CM | POA: Diagnosis not present

## 2012-08-09 DIAGNOSIS — M5030 Other cervical disc degeneration, unspecified cervical region: Secondary | ICD-10-CM | POA: Diagnosis not present

## 2012-08-11 ENCOUNTER — Encounter (HOSPITAL_COMMUNITY)
Admission: RE | Admit: 2012-08-11 | Discharge: 2012-08-11 | Disposition: A | Discharge status: Home / Self Care | Payer: Medicare Other | Source: Ambulatory Visit | Attending: Cardiovascular Disease | Admitting: Cardiovascular Disease

## 2012-08-11 DIAGNOSIS — M5030 Other cervical disc degeneration, unspecified cervical region: Secondary | ICD-10-CM | POA: Diagnosis not present

## 2012-08-11 DIAGNOSIS — M9981 Other biomechanical lesions of cervical region: Secondary | ICD-10-CM | POA: Diagnosis not present

## 2012-08-11 NOTE — Progress Notes (Signed)
Brittany Figueroa's blood pressures were elevated today on the nustep today.  Brittany Figueroa exercised at a harder intensity than usual.   Brittany Figueroa's exit blood pressure was 124/70.  Brittany Figueroa said she had chest soreness from carrying several bags of meat yesterday.  Brittany Figueroa said she instantly felt that she pulled something after lifting the bags.  Brittany Figueroa called this afternoon to inform me that her sister died today at [facility].  Brittany Figueroa will be absent on Friday. Will fax exercise flow sheets to Dr. Hazle Coca office for review.

## 2012-08-13 ENCOUNTER — Encounter (HOSPITAL_COMMUNITY): Payer: Medicare Other

## 2012-08-16 ENCOUNTER — Encounter (HOSPITAL_COMMUNITY)
Admission: RE | Admit: 2012-08-16 | Discharge: 2012-08-16 | Disposition: A | Discharge status: Home / Self Care | Payer: Medicare Other | Source: Ambulatory Visit | Attending: Cardiovascular Disease | Admitting: Cardiovascular Disease

## 2012-08-16 DIAGNOSIS — M9981 Other biomechanical lesions of cervical region: Secondary | ICD-10-CM | POA: Diagnosis not present

## 2012-08-16 DIAGNOSIS — M5030 Other cervical disc degeneration, unspecified cervical region: Secondary | ICD-10-CM | POA: Diagnosis not present

## 2012-08-18 ENCOUNTER — Encounter (HOSPITAL_COMMUNITY)
Admission: RE | Admit: 2012-08-18 | Discharge: 2012-08-18 | Disposition: A | Discharge status: Home / Self Care | Payer: Medicare Other | Source: Ambulatory Visit | Attending: Cardiovascular Disease | Admitting: Cardiovascular Disease

## 2012-08-18 DIAGNOSIS — E785 Hyperlipidemia, unspecified: Secondary | ICD-10-CM | POA: Diagnosis not present

## 2012-08-18 DIAGNOSIS — M9981 Other biomechanical lesions of cervical region: Secondary | ICD-10-CM | POA: Diagnosis not present

## 2012-08-18 DIAGNOSIS — I2510 Atherosclerotic heart disease of native coronary artery without angina pectoris: Secondary | ICD-10-CM | POA: Insufficient documentation

## 2012-08-18 DIAGNOSIS — I10 Essential (primary) hypertension: Secondary | ICD-10-CM | POA: Insufficient documentation

## 2012-08-18 DIAGNOSIS — M5030 Other cervical disc degeneration, unspecified cervical region: Secondary | ICD-10-CM | POA: Diagnosis not present

## 2012-08-18 DIAGNOSIS — I209 Angina pectoris, unspecified: Secondary | ICD-10-CM | POA: Diagnosis not present

## 2012-08-18 DIAGNOSIS — Z5189 Encounter for other specified aftercare: Secondary | ICD-10-CM | POA: Insufficient documentation

## 2012-08-19 DIAGNOSIS — E782 Mixed hyperlipidemia: Secondary | ICD-10-CM | POA: Diagnosis not present

## 2012-08-19 DIAGNOSIS — I10 Essential (primary) hypertension: Secondary | ICD-10-CM | POA: Diagnosis not present

## 2012-08-19 DIAGNOSIS — I2510 Atherosclerotic heart disease of native coronary artery without angina pectoris: Secondary | ICD-10-CM | POA: Diagnosis not present

## 2012-08-20 ENCOUNTER — Encounter (HOSPITAL_COMMUNITY): Payer: Medicare Other

## 2012-08-23 ENCOUNTER — Encounter (HOSPITAL_COMMUNITY)
Admission: RE | Admit: 2012-08-23 | Discharge: 2012-08-23 | Disposition: A | Discharge status: Home / Self Care | Payer: Medicare Other | Source: Ambulatory Visit | Attending: Cardiovascular Disease | Admitting: Cardiovascular Disease

## 2012-08-23 DIAGNOSIS — M9981 Other biomechanical lesions of cervical region: Secondary | ICD-10-CM | POA: Diagnosis not present

## 2012-08-23 DIAGNOSIS — M5030 Other cervical disc degeneration, unspecified cervical region: Secondary | ICD-10-CM | POA: Diagnosis not present

## 2012-08-25 ENCOUNTER — Encounter (HOSPITAL_COMMUNITY)
Admission: RE | Admit: 2012-08-25 | Discharge: 2012-08-25 | Disposition: A | Discharge status: Home / Self Care | Payer: Medicare Other | Source: Ambulatory Visit | Attending: Cardiovascular Disease | Admitting: Cardiovascular Disease

## 2012-08-25 DIAGNOSIS — M9981 Other biomechanical lesions of cervical region: Secondary | ICD-10-CM | POA: Diagnosis not present

## 2012-08-25 DIAGNOSIS — M5030 Other cervical disc degeneration, unspecified cervical region: Secondary | ICD-10-CM | POA: Diagnosis not present

## 2012-08-27 ENCOUNTER — Encounter (HOSPITAL_COMMUNITY)
Admission: RE | Admit: 2012-08-27 | Discharge: 2012-08-27 | Disposition: A | Discharge status: Home / Self Care | Payer: Medicare Other | Source: Ambulatory Visit | Attending: Cardiovascular Disease | Admitting: Cardiovascular Disease

## 2012-08-27 NOTE — Progress Notes (Signed)
Brittany Figueroa is concerned that is bruising a lot since she has been taking Brillinta. Brittany Figueroa has called Dr Hazle Coca office to make him aware.

## 2012-08-30 ENCOUNTER — Encounter (HOSPITAL_COMMUNITY)
Admission: RE | Admit: 2012-08-30 | Discharge: 2012-08-30 | Disposition: A | Discharge status: Home / Self Care | Payer: Medicare Other | Source: Ambulatory Visit | Attending: Cardiovascular Disease | Admitting: Cardiovascular Disease

## 2012-08-30 DIAGNOSIS — M9981 Other biomechanical lesions of cervical region: Secondary | ICD-10-CM | POA: Diagnosis not present

## 2012-08-30 DIAGNOSIS — M5030 Other cervical disc degeneration, unspecified cervical region: Secondary | ICD-10-CM | POA: Diagnosis not present

## 2012-09-01 ENCOUNTER — Encounter (HOSPITAL_COMMUNITY)
Admission: RE | Admit: 2012-09-01 | Discharge: 2012-09-01 | Disposition: A | Discharge status: Home / Self Care | Payer: Medicare Other | Source: Ambulatory Visit | Attending: Cardiovascular Disease | Admitting: Cardiovascular Disease

## 2012-09-01 DIAGNOSIS — M67919 Unspecified disorder of synovium and tendon, unspecified shoulder: Secondary | ICD-10-CM | POA: Diagnosis not present

## 2012-09-01 DIAGNOSIS — M719 Bursopathy, unspecified: Secondary | ICD-10-CM | POA: Diagnosis not present

## 2012-09-01 DIAGNOSIS — M542 Cervicalgia: Secondary | ICD-10-CM | POA: Diagnosis not present

## 2012-09-01 DIAGNOSIS — M9981 Other biomechanical lesions of cervical region: Secondary | ICD-10-CM | POA: Diagnosis not present

## 2012-09-01 DIAGNOSIS — M5030 Other cervical disc degeneration, unspecified cervical region: Secondary | ICD-10-CM | POA: Diagnosis not present

## 2012-09-03 ENCOUNTER — Encounter (HOSPITAL_COMMUNITY)
Admission: RE | Admit: 2012-09-03 | Discharge: 2012-09-03 | Disposition: A | Discharge status: Home / Self Care | Payer: Medicare Other | Source: Ambulatory Visit | Attending: Cardiovascular Disease | Admitting: Cardiovascular Disease

## 2012-09-06 ENCOUNTER — Encounter (HOSPITAL_COMMUNITY)
Admission: RE | Admit: 2012-09-06 | Discharge: 2012-09-06 | Disposition: A | Discharge status: Home / Self Care | Payer: Medicare Other | Source: Ambulatory Visit | Attending: Cardiovascular Disease | Admitting: Cardiovascular Disease

## 2012-09-06 DIAGNOSIS — M9981 Other biomechanical lesions of cervical region: Secondary | ICD-10-CM | POA: Diagnosis not present

## 2012-09-06 DIAGNOSIS — M5030 Other cervical disc degeneration, unspecified cervical region: Secondary | ICD-10-CM | POA: Diagnosis not present

## 2012-09-06 DIAGNOSIS — M542 Cervicalgia: Secondary | ICD-10-CM | POA: Diagnosis not present

## 2012-09-08 ENCOUNTER — Encounter (HOSPITAL_COMMUNITY)
Admission: RE | Admit: 2012-09-08 | Discharge: 2012-09-08 | Disposition: A | Discharge status: Home / Self Care | Payer: Medicare Other | Source: Ambulatory Visit | Attending: Cardiovascular Disease | Admitting: Cardiovascular Disease

## 2012-09-08 DIAGNOSIS — M9981 Other biomechanical lesions of cervical region: Secondary | ICD-10-CM | POA: Diagnosis not present

## 2012-09-08 DIAGNOSIS — M5030 Other cervical disc degeneration, unspecified cervical region: Secondary | ICD-10-CM | POA: Diagnosis not present

## 2012-09-09 DIAGNOSIS — M47812 Spondylosis without myelopathy or radiculopathy, cervical region: Secondary | ICD-10-CM | POA: Diagnosis not present

## 2012-09-10 ENCOUNTER — Encounter (HOSPITAL_COMMUNITY)
Admission: RE | Admit: 2012-09-10 | Discharge: 2012-09-10 | Disposition: A | Discharge status: Home / Self Care | Payer: Medicare Other | Source: Ambulatory Visit | Attending: Cardiovascular Disease | Admitting: Cardiovascular Disease

## 2012-09-13 ENCOUNTER — Encounter (HOSPITAL_COMMUNITY)
Admission: RE | Admit: 2012-09-13 | Discharge: 2012-09-13 | Disposition: A | Discharge status: Home / Self Care | Payer: Medicare Other | Source: Ambulatory Visit | Attending: Cardiovascular Disease | Admitting: Cardiovascular Disease

## 2012-09-13 DIAGNOSIS — M9981 Other biomechanical lesions of cervical region: Secondary | ICD-10-CM | POA: Diagnosis not present

## 2012-09-13 DIAGNOSIS — M47812 Spondylosis without myelopathy or radiculopathy, cervical region: Secondary | ICD-10-CM | POA: Diagnosis not present

## 2012-09-13 DIAGNOSIS — M5030 Other cervical disc degeneration, unspecified cervical region: Secondary | ICD-10-CM | POA: Diagnosis not present

## 2012-09-15 ENCOUNTER — Encounter (HOSPITAL_COMMUNITY)
Admission: RE | Admit: 2012-09-15 | Discharge: 2012-09-15 | Disposition: A | Discharge status: Home / Self Care | Payer: Medicare Other | Source: Ambulatory Visit | Attending: Cardiovascular Disease | Admitting: Cardiovascular Disease

## 2012-09-17 ENCOUNTER — Encounter (HOSPITAL_COMMUNITY): Payer: Medicare Other

## 2012-09-17 ENCOUNTER — Encounter: Payer: Self-pay | Admitting: Cardiovascular Disease

## 2012-09-20 ENCOUNTER — Encounter (HOSPITAL_COMMUNITY): Payer: Medicare Other

## 2012-09-22 ENCOUNTER — Encounter (HOSPITAL_COMMUNITY)
Admission: RE | Admit: 2012-09-22 | Discharge: 2012-09-22 | Disposition: A | Discharge status: Home / Self Care | Payer: Medicare Other | Source: Ambulatory Visit | Attending: Cardiovascular Disease | Admitting: Cardiovascular Disease

## 2012-09-22 DIAGNOSIS — I2510 Atherosclerotic heart disease of native coronary artery without angina pectoris: Secondary | ICD-10-CM | POA: Diagnosis not present

## 2012-09-22 DIAGNOSIS — M5030 Other cervical disc degeneration, unspecified cervical region: Secondary | ICD-10-CM | POA: Diagnosis not present

## 2012-09-22 DIAGNOSIS — I10 Essential (primary) hypertension: Secondary | ICD-10-CM | POA: Insufficient documentation

## 2012-09-22 DIAGNOSIS — E785 Hyperlipidemia, unspecified: Secondary | ICD-10-CM | POA: Insufficient documentation

## 2012-09-22 DIAGNOSIS — I209 Angina pectoris, unspecified: Secondary | ICD-10-CM | POA: Insufficient documentation

## 2012-09-22 DIAGNOSIS — M47812 Spondylosis without myelopathy or radiculopathy, cervical region: Secondary | ICD-10-CM | POA: Diagnosis not present

## 2012-09-22 DIAGNOSIS — Z5189 Encounter for other specified aftercare: Secondary | ICD-10-CM | POA: Diagnosis not present

## 2012-09-22 DIAGNOSIS — M542 Cervicalgia: Secondary | ICD-10-CM | POA: Diagnosis not present

## 2012-09-24 ENCOUNTER — Encounter (HOSPITAL_COMMUNITY)
Admission: RE | Admit: 2012-09-24 | Discharge: 2012-09-24 | Disposition: A | Discharge status: Home / Self Care | Payer: Medicare Other | Source: Ambulatory Visit | Attending: Cardiovascular Disease | Admitting: Cardiovascular Disease

## 2012-09-24 DIAGNOSIS — Z1231 Encounter for screening mammogram for malignant neoplasm of breast: Secondary | ICD-10-CM | POA: Diagnosis not present

## 2012-09-24 DIAGNOSIS — M5030 Other cervical disc degeneration, unspecified cervical region: Secondary | ICD-10-CM | POA: Diagnosis not present

## 2012-09-24 DIAGNOSIS — Z1289 Encounter for screening for malignant neoplasm of other sites: Secondary | ICD-10-CM | POA: Diagnosis not present

## 2012-09-24 DIAGNOSIS — M47812 Spondylosis without myelopathy or radiculopathy, cervical region: Secondary | ICD-10-CM | POA: Diagnosis not present

## 2012-09-24 DIAGNOSIS — M542 Cervicalgia: Secondary | ICD-10-CM | POA: Diagnosis not present

## 2012-09-27 ENCOUNTER — Encounter (HOSPITAL_COMMUNITY): Payer: Medicare Other

## 2012-09-27 ENCOUNTER — Encounter (HOSPITAL_COMMUNITY)
Admission: RE | Admit: 2012-09-27 | Discharge: 2012-09-27 | Disposition: A | Discharge status: Home / Self Care | Payer: Medicare Other | Source: Ambulatory Visit

## 2012-09-27 DIAGNOSIS — M5030 Other cervical disc degeneration, unspecified cervical region: Secondary | ICD-10-CM | POA: Diagnosis not present

## 2012-09-27 DIAGNOSIS — M47812 Spondylosis without myelopathy or radiculopathy, cervical region: Secondary | ICD-10-CM | POA: Diagnosis not present

## 2012-09-27 DIAGNOSIS — M542 Cervicalgia: Secondary | ICD-10-CM | POA: Diagnosis not present

## 2012-09-29 ENCOUNTER — Encounter (HOSPITAL_COMMUNITY)
Admission: RE | Admit: 2012-09-29 | Discharge: 2012-09-29 | Disposition: A | Discharge status: Home / Self Care | Payer: Medicare Other | Source: Ambulatory Visit | Attending: Cardiovascular Disease | Admitting: Cardiovascular Disease

## 2012-09-29 DIAGNOSIS — M47812 Spondylosis without myelopathy or radiculopathy, cervical region: Secondary | ICD-10-CM | POA: Diagnosis not present

## 2012-09-29 DIAGNOSIS — M5030 Other cervical disc degeneration, unspecified cervical region: Secondary | ICD-10-CM | POA: Diagnosis not present

## 2012-09-29 DIAGNOSIS — M542 Cervicalgia: Secondary | ICD-10-CM | POA: Diagnosis not present

## 2012-09-29 NOTE — Progress Notes (Signed)
[name] today and plans to continue exercise on her own. Brittany Figueroa is going to walk on her own and exercise at the [facility].

## 2012-10-05 DIAGNOSIS — M542 Cervicalgia: Secondary | ICD-10-CM | POA: Diagnosis not present

## 2012-10-05 DIAGNOSIS — I10 Essential (primary) hypertension: Secondary | ICD-10-CM | POA: Diagnosis not present

## 2012-10-05 DIAGNOSIS — R7301 Impaired fasting glucose: Secondary | ICD-10-CM | POA: Diagnosis not present

## 2012-10-05 DIAGNOSIS — M47812 Spondylosis without myelopathy or radiculopathy, cervical region: Secondary | ICD-10-CM | POA: Diagnosis not present

## 2012-10-05 DIAGNOSIS — M5030 Other cervical disc degeneration, unspecified cervical region: Secondary | ICD-10-CM | POA: Diagnosis not present

## 2012-10-05 DIAGNOSIS — E559 Vitamin D deficiency, unspecified: Secondary | ICD-10-CM | POA: Diagnosis not present

## 2012-10-08 DIAGNOSIS — M5030 Other cervical disc degeneration, unspecified cervical region: Secondary | ICD-10-CM | POA: Diagnosis not present

## 2012-10-08 DIAGNOSIS — M542 Cervicalgia: Secondary | ICD-10-CM | POA: Diagnosis not present

## 2012-10-08 DIAGNOSIS — M47812 Spondylosis without myelopathy or radiculopathy, cervical region: Secondary | ICD-10-CM | POA: Diagnosis not present

## 2012-10-12 ENCOUNTER — Other Ambulatory Visit: Payer: Self-pay | Admitting: Internal Medicine

## 2012-10-12 DIAGNOSIS — I6529 Occlusion and stenosis of unspecified carotid artery: Secondary | ICD-10-CM

## 2012-10-12 DIAGNOSIS — I10 Essential (primary) hypertension: Secondary | ICD-10-CM | POA: Diagnosis not present

## 2012-10-12 DIAGNOSIS — R7301 Impaired fasting glucose: Secondary | ICD-10-CM | POA: Diagnosis not present

## 2012-10-12 DIAGNOSIS — E785 Hyperlipidemia, unspecified: Secondary | ICD-10-CM | POA: Diagnosis not present

## 2012-10-12 DIAGNOSIS — I2510 Atherosclerotic heart disease of native coronary artery without angina pectoris: Secondary | ICD-10-CM | POA: Diagnosis not present

## 2012-10-19 DIAGNOSIS — I10 Essential (primary) hypertension: Secondary | ICD-10-CM | POA: Diagnosis not present

## 2012-10-19 DIAGNOSIS — M545 Low back pain, unspecified: Secondary | ICD-10-CM | POA: Diagnosis not present

## 2012-10-19 DIAGNOSIS — M5380 Other specified dorsopathies, site unspecified: Secondary | ICD-10-CM | POA: Diagnosis not present

## 2012-10-20 ENCOUNTER — Ambulatory Visit
Admission: RE | Admit: 2012-10-20 | Discharge: 2012-10-20 | Disposition: A | Discharge status: Home / Self Care | Payer: Medicare Other | Source: Ambulatory Visit | Attending: Internal Medicine | Admitting: Internal Medicine

## 2012-10-20 DIAGNOSIS — I6529 Occlusion and stenosis of unspecified carotid artery: Secondary | ICD-10-CM

## 2012-10-20 DIAGNOSIS — I658 Occlusion and stenosis of other precerebral arteries: Secondary | ICD-10-CM | POA: Diagnosis not present

## 2012-10-21 DIAGNOSIS — M47812 Spondylosis without myelopathy or radiculopathy, cervical region: Secondary | ICD-10-CM | POA: Diagnosis not present

## 2012-10-21 DIAGNOSIS — M542 Cervicalgia: Secondary | ICD-10-CM | POA: Diagnosis not present

## 2012-10-21 DIAGNOSIS — M5030 Other cervical disc degeneration, unspecified cervical region: Secondary | ICD-10-CM | POA: Diagnosis not present

## 2012-10-25 DIAGNOSIS — M546 Pain in thoracic spine: Secondary | ICD-10-CM | POA: Diagnosis not present

## 2012-10-25 DIAGNOSIS — M47812 Spondylosis without myelopathy or radiculopathy, cervical region: Secondary | ICD-10-CM | POA: Diagnosis not present

## 2012-10-25 DIAGNOSIS — M5030 Other cervical disc degeneration, unspecified cervical region: Secondary | ICD-10-CM | POA: Diagnosis not present

## 2012-10-26 DIAGNOSIS — M545 Low back pain, unspecified: Secondary | ICD-10-CM | POA: Diagnosis not present

## 2012-10-27 DIAGNOSIS — M47812 Spondylosis without myelopathy or radiculopathy, cervical region: Secondary | ICD-10-CM | POA: Diagnosis not present

## 2012-10-27 DIAGNOSIS — M5030 Other cervical disc degeneration, unspecified cervical region: Secondary | ICD-10-CM | POA: Diagnosis not present

## 2012-10-27 DIAGNOSIS — M542 Cervicalgia: Secondary | ICD-10-CM | POA: Diagnosis not present

## 2012-10-29 DIAGNOSIS — M5030 Other cervical disc degeneration, unspecified cervical region: Secondary | ICD-10-CM | POA: Diagnosis not present

## 2012-10-29 DIAGNOSIS — M47812 Spondylosis without myelopathy or radiculopathy, cervical region: Secondary | ICD-10-CM | POA: Diagnosis not present

## 2012-10-29 DIAGNOSIS — M542 Cervicalgia: Secondary | ICD-10-CM | POA: Diagnosis not present

## 2012-11-02 DIAGNOSIS — M47812 Spondylosis without myelopathy or radiculopathy, cervical region: Secondary | ICD-10-CM | POA: Diagnosis not present

## 2012-11-02 DIAGNOSIS — M542 Cervicalgia: Secondary | ICD-10-CM | POA: Diagnosis not present

## 2012-11-04 DIAGNOSIS — M542 Cervicalgia: Secondary | ICD-10-CM | POA: Diagnosis not present

## 2012-11-04 DIAGNOSIS — I10 Essential (primary) hypertension: Secondary | ICD-10-CM | POA: Diagnosis not present

## 2012-11-04 DIAGNOSIS — M5030 Other cervical disc degeneration, unspecified cervical region: Secondary | ICD-10-CM | POA: Diagnosis not present

## 2012-11-04 DIAGNOSIS — R7301 Impaired fasting glucose: Secondary | ICD-10-CM | POA: Diagnosis not present

## 2012-11-04 DIAGNOSIS — E785 Hyperlipidemia, unspecified: Secondary | ICD-10-CM | POA: Diagnosis not present

## 2012-11-04 DIAGNOSIS — M47812 Spondylosis without myelopathy or radiculopathy, cervical region: Secondary | ICD-10-CM | POA: Diagnosis not present

## 2012-11-04 DIAGNOSIS — I2510 Atherosclerotic heart disease of native coronary artery without angina pectoris: Secondary | ICD-10-CM | POA: Diagnosis not present

## 2012-11-08 DIAGNOSIS — M47812 Spondylosis without myelopathy or radiculopathy, cervical region: Secondary | ICD-10-CM | POA: Diagnosis not present

## 2012-11-08 DIAGNOSIS — M542 Cervicalgia: Secondary | ICD-10-CM | POA: Diagnosis not present

## 2012-11-08 DIAGNOSIS — M5030 Other cervical disc degeneration, unspecified cervical region: Secondary | ICD-10-CM | POA: Diagnosis not present

## 2012-11-15 DIAGNOSIS — M47812 Spondylosis without myelopathy or radiculopathy, cervical region: Secondary | ICD-10-CM | POA: Diagnosis not present

## 2012-11-15 DIAGNOSIS — M5030 Other cervical disc degeneration, unspecified cervical region: Secondary | ICD-10-CM | POA: Diagnosis not present

## 2012-11-15 DIAGNOSIS — M542 Cervicalgia: Secondary | ICD-10-CM | POA: Diagnosis not present

## 2012-11-22 DIAGNOSIS — M5030 Other cervical disc degeneration, unspecified cervical region: Secondary | ICD-10-CM | POA: Diagnosis not present

## 2012-11-22 DIAGNOSIS — M47812 Spondylosis without myelopathy or radiculopathy, cervical region: Secondary | ICD-10-CM | POA: Diagnosis not present

## 2012-11-23 DIAGNOSIS — M5030 Other cervical disc degeneration, unspecified cervical region: Secondary | ICD-10-CM | POA: Diagnosis not present

## 2012-11-23 DIAGNOSIS — M47812 Spondylosis without myelopathy or radiculopathy, cervical region: Secondary | ICD-10-CM | POA: Diagnosis not present

## 2012-11-23 DIAGNOSIS — M542 Cervicalgia: Secondary | ICD-10-CM | POA: Diagnosis not present

## 2012-11-29 DIAGNOSIS — M542 Cervicalgia: Secondary | ICD-10-CM | POA: Diagnosis not present

## 2012-11-29 DIAGNOSIS — M5030 Other cervical disc degeneration, unspecified cervical region: Secondary | ICD-10-CM | POA: Diagnosis not present

## 2012-11-29 DIAGNOSIS — M47812 Spondylosis without myelopathy or radiculopathy, cervical region: Secondary | ICD-10-CM | POA: Diagnosis not present

## 2012-12-02 ENCOUNTER — Telehealth: Payer: Self-pay | Admitting: Gastroenterology

## 2012-12-02 NOTE — Telephone Encounter (Signed)
Patient reports that she has cardiac stent placed in December of last year.  Dr. [name] does not want her to stop Berlinta for 1 year.  Dr. Russella Dar is it ok to move her recall to January of next year?

## 2012-12-03 DIAGNOSIS — B029 Zoster without complications: Secondary | ICD-10-CM | POA: Diagnosis not present

## 2012-12-03 NOTE — Telephone Encounter (Signed)
Yes move to January 2015

## 2012-12-03 NOTE — Telephone Encounter (Signed)
Recall change

## 2012-12-06 ENCOUNTER — Encounter: Payer: Self-pay | Admitting: Gastroenterology

## 2012-12-08 ENCOUNTER — Encounter: Payer: Self-pay | Admitting: Gastroenterology

## 2012-12-10 DIAGNOSIS — B354 Tinea corporis: Secondary | ICD-10-CM | POA: Diagnosis not present

## 2012-12-13 ENCOUNTER — Ambulatory Visit (INDEPENDENT_AMBULATORY_CARE_PROVIDER_SITE_OTHER): Payer: Medicare Other | Admitting: Physician Assistant

## 2012-12-13 ENCOUNTER — Encounter: Payer: Self-pay | Admitting: Physician Assistant

## 2012-12-13 VITALS — BP 128/78 | Ht 61.5 in | Wt 142.5 lb

## 2012-12-13 DIAGNOSIS — R0609 Other forms of dyspnea: Secondary | ICD-10-CM | POA: Diagnosis not present

## 2012-12-13 DIAGNOSIS — R0989 Other specified symptoms and signs involving the circulatory and respiratory systems: Secondary | ICD-10-CM | POA: Diagnosis not present

## 2012-12-13 DIAGNOSIS — B372 Candidiasis of skin and nail: Secondary | ICD-10-CM | POA: Insufficient documentation

## 2012-12-13 DIAGNOSIS — I2510 Atherosclerotic heart disease of native coronary artery without angina pectoris: Secondary | ICD-10-CM | POA: Diagnosis not present

## 2012-12-13 DIAGNOSIS — R0600 Dyspnea, unspecified: Secondary | ICD-10-CM

## 2012-12-13 NOTE — Assessment & Plan Note (Addendum)
She has no indications of CHF.  Lungs are absolutely clear on exam.  I do not think a CXR is needed.  This could be a symptom of brilinta which she has been on since Dec 2013 and will be on until, at least, Dec 2014.  If symptoms persist, we may need to change her to Effient.

## 2012-12-13 NOTE — Patient Instructions (Addendum)
Follow up in December with Dr. [name].  Call for follow up appt. If you do not see improvement 1-2 weeks, we may need to switch you to a different medication for your stent.

## 2012-12-13 NOTE — Progress Notes (Signed)
Date:  12/13/2012   ID:  Brittany Figueroa, DOB Jan 15, 1941, MRN [id number]  PCP:  Thayer Headings, MD  Primary Cardiologist:  [name]    History of Present Illness: Brittany Figueroa is a 72 y.o. female who has a history of hypertension and hyperlipidemia, who was admitted on 12/26 to undergo diagnostic coronary arteriography to define her anatomy and rule out an ischemic abnormality after an abnormal OP NST, that suggested infero-lateral ischemia, compared to a prior study. The procedure was performed by Dr. [name], who is also her primary cardiologist. The cath revealed high grade proximal LAD stenosis. She underwent successful FFR guided PCI and stenting to the proximal LAD with a DES  The patient complains of dyspnea, which comes and goes, for the last 4-5 days.  She also reports a rash under both breasts which was initially treated as shingles but later the Dx was changed to yeast infection by her Dermatologist.  The patient currently denies nausea, vomiting, fever, chest pain, orthopnea, dizziness, PND, cough, congestion, abdominal pain, hematochezia, melena, lower extremity edema, claudication.  Wt Readings from Last 3 Encounters:  12/13/12 142 lb 8 oz (64.638 kg)  06/17/12 144 lb 2.9 oz (65.4 kg)  05/14/12 154 lb 15.7 oz (70.3 kg)     Past Medical History  Diagnosis Date  . Complication of anesthesia 12/2009    "had endoscopy; larynx went into spasms; stopped breathing for 15 seconds" (05/13/2012)  . Coronary artery disease   . Hypercholesteremia   . Hypertension   . GERD (gastroesophageal reflux disease) 2011  . Arthritis     "right thumb; all my fingers" (05/13/2012)  . CMC arthritis, thumb, degenerative   . Abnormal finding on EKG, new anterolateral T-wave inversions  05/14/2012  . Abnormal nuclear stress test, with infero-lateral ischemia 05/14/2012  . Atypical angina 05/14/2012  . CAD (coronary artery disease), 05/13/12, with 80% LAD 05/14/2012  . S/P angioplasty with stent,  LAD 05/13/12 05/14/2012  . Hyperlipidemia 05/14/2012  . HTN (hypertension) 05/14/2012    Current Outpatient Prescriptions  Medication Sig Dispense Refill  . aspirin 81 MG tablet Take 81 mg by mouth daily.      . calcium-vitamin D (OSCAL WITH D) 500-200 MG-UNIT per tablet Take 2 tablets by mouth daily.      . carvedilol (COREG) 6.25 MG tablet Take 6.25 mg by mouth 2 (two) times daily with a meal. Four(4) times a week      . Cholecalciferol (VITAMIN D3) 2000 UNITS TABS Take 2,000 Units by mouth 2 (two) times daily.      . diclofenac sodium (VOLTAREN) 1 % GEL Apply 2 g topically daily as needed. For pain      . econazole nitrate 1 % cream Apply topically 2 (two) times daily as needed.      . ezetimibe (ZETIA) 10 MG tablet Take 1 tablet (10 mg total) by mouth daily.  30 tablet  11  . fish oil-omega-3 fatty acids 1000 MG capsule Take 2 g by mouth daily.      . hydrocortisone valerate ointment (WEST-CORT) 0.2 % Apply 1 application topically 2 (two) times daily.      Marland Kitchen losartan (COZAAR) 50 MG tablet Take 50 mg by mouth daily.      . metoprolol succinate (TOPROL-XL) 25 MG 24 hr tablet Take 12.5 mg by mouth daily. Three(3) times a week      . Multiple Vitamin (MULTIVITAMIN WITH MINERALS) TABS Take 1 tablet by mouth daily.      Marland Kitchen  nitroGLYCERIN (NITROSTAT) 0.4 MG SL tablet Place 1 tablet (0.4 mg total) under the tongue every 5 (five) minutes as needed for chest pain.  25 tablet  4  . rosuvastatin (CRESTOR) 5 MG tablet Take 5 mg by mouth once a week.      . Ticagrelor (BRILINTA) 90 MG TABS tablet Take 1 tablet (90 mg total) by mouth 2 (two) times daily.  60 tablet  11   No current facility-administered medications for this visit.    Allergies:    Allergies  Allergen Reactions  . Statins Other (See Comments)    "intermittent loss of circulation; hands and arms will go to sleep; feet will get cramps; fatigue" (05/13/2012)    Social History:  The patient  reports that she quit smoking about 24  years ago. Her smoking use included Cigarettes. She has a 10 pack-year smoking history. She has never used smokeless tobacco. She reports that she drinks about 7.2 ounces of alcohol per week. She reports that she does not use illicit drugs.   Family history:  No family history on file.  ROS:  Please see the history of present illness.  All other systems reviewed and negative.   PHYSICAL EXAM: VS:  BP 128/78  Ht 5' 1.5" (1.562 m)  Wt 142 lb 8 oz (64.638 kg)  BMI 26.49 kg/m2 Well nourished, well developed, in no acute distress HEENT: Pupils are equal round react to light accommodation extraocular movements are intact.  Neck: no JVDNo cervical lymphadenopathy. Cardiac: Regular rate and rhythm without murmurs rubs or gallops. Lungs:  clear to auscultation bilaterally, no wheezing, rhonchi or rales Abd: soft, nontender, positive bowel sounds all quadrants, no hepatosplenomegaly Ext: no lower extremity edema.  2+ radial and dorsalis pedis pulses. Skin: warm and dry.  Erythematous rash under both breasts and worse on the left. Neuro:  Grossly normal  EKG:  NSR LAFB, rate 69bpm  ASSESSMENT AND PLAN:  Problem List Items Addressed This Visit   Yeast infection of the skin: under both breasts     Treated with diflucan and topicals by her Dermatologist.      Relevant Medications      econazole nitrate 1 % cream   Dyspnea     She has no indications of CHF.  Lungs are absolutely clear on exam.  I do not think a CXR is needed.  This could be a symptom of brilinta which she has been on since Dec 2013 and will be on until, at least, Dec 2014.  If symptoms persist, we may need to change her to Effient.    CAD (coronary artery disease), 05/13/12, with 80% LAD and totally occluded RCA - Primary   Relevant Medications      metoprolol succinate (TOPROL-XL) 25 MG 24 hr tablet      carvedilol (COREG) 6.25 MG tablet   Other Relevant Orders      EKG 12-Lead

## 2012-12-13 NOTE — Assessment & Plan Note (Signed)
Treated with diflucan and topicals by her Dermatologist.

## 2012-12-14 DIAGNOSIS — M542 Cervicalgia: Secondary | ICD-10-CM | POA: Diagnosis not present

## 2012-12-14 DIAGNOSIS — M5030 Other cervical disc degeneration, unspecified cervical region: Secondary | ICD-10-CM | POA: Diagnosis not present

## 2012-12-14 DIAGNOSIS — M47812 Spondylosis without myelopathy or radiculopathy, cervical region: Secondary | ICD-10-CM | POA: Diagnosis not present

## 2012-12-22 DIAGNOSIS — L538 Other specified erythematous conditions: Secondary | ICD-10-CM | POA: Diagnosis not present

## 2013-01-24 DIAGNOSIS — E785 Hyperlipidemia, unspecified: Secondary | ICD-10-CM | POA: Diagnosis not present

## 2013-01-24 DIAGNOSIS — R7301 Impaired fasting glucose: Secondary | ICD-10-CM | POA: Diagnosis not present

## 2013-01-31 DIAGNOSIS — I10 Essential (primary) hypertension: Secondary | ICD-10-CM | POA: Diagnosis not present

## 2013-01-31 DIAGNOSIS — I2510 Atherosclerotic heart disease of native coronary artery without angina pectoris: Secondary | ICD-10-CM | POA: Diagnosis not present

## 2013-01-31 DIAGNOSIS — E785 Hyperlipidemia, unspecified: Secondary | ICD-10-CM | POA: Diagnosis not present

## 2013-01-31 DIAGNOSIS — R7301 Impaired fasting glucose: Secondary | ICD-10-CM | POA: Diagnosis not present

## 2013-02-07 DIAGNOSIS — I2510 Atherosclerotic heart disease of native coronary artery without angina pectoris: Secondary | ICD-10-CM | POA: Diagnosis not present

## 2013-02-07 DIAGNOSIS — I10 Essential (primary) hypertension: Secondary | ICD-10-CM | POA: Diagnosis not present

## 2013-02-07 DIAGNOSIS — E785 Hyperlipidemia, unspecified: Secondary | ICD-10-CM | POA: Diagnosis not present

## 2013-02-07 DIAGNOSIS — R7301 Impaired fasting glucose: Secondary | ICD-10-CM | POA: Diagnosis not present

## 2013-02-16 ENCOUNTER — Encounter: Payer: Self-pay | Admitting: Cardiology

## 2013-02-22 ENCOUNTER — Ambulatory Visit (INDEPENDENT_AMBULATORY_CARE_PROVIDER_SITE_OTHER): Payer: Medicare Other | Admitting: Cardiology

## 2013-02-22 ENCOUNTER — Encounter: Payer: Self-pay | Admitting: Cardiology

## 2013-02-22 VITALS — BP 160/88 | HR 76 | Ht 61.5 in | Wt 141.8 lb

## 2013-02-22 DIAGNOSIS — Z9889 Other specified postprocedural states: Secondary | ICD-10-CM | POA: Diagnosis not present

## 2013-02-22 DIAGNOSIS — I10 Essential (primary) hypertension: Secondary | ICD-10-CM | POA: Diagnosis not present

## 2013-02-22 DIAGNOSIS — Z95820 Peripheral vascular angioplasty status with implants and grafts: Secondary | ICD-10-CM

## 2013-02-22 DIAGNOSIS — E785 Hyperlipidemia, unspecified: Secondary | ICD-10-CM | POA: Diagnosis not present

## 2013-02-22 DIAGNOSIS — I2510 Atherosclerotic heart disease of native coronary artery without angina pectoris: Secondary | ICD-10-CM | POA: Diagnosis not present

## 2013-02-22 MED ORDER — TICAGRELOR 90 MG PO TABS: 90.0000 mg | ORAL_TABLET | Freq: Two times a day (BID) | ORAL | Status: DC

## 2013-02-22 MED ORDER — EZETIMIBE 10 MG PO TABS: 10.0000 mg | ORAL_TABLET | Freq: Every day | ORAL | Status: DC

## 2013-02-22 MED ORDER — ROSUVASTATIN CALCIUM 10 MG PO TABS: 5.0000 mg | ORAL_TABLET | Freq: Every day | ORAL | Status: DC

## 2013-02-22 NOTE — Progress Notes (Addendum)
02/22/2013  PCP: Thayer Headings, MD   Chief Complaint  Patient presents with  . follow up    last OV with [facility] end of July, sporadic SOB - r/t brilinta; denies CP/LE edema; neck pain at night - protruding disks; Dr Thea Silversmith took off metoprolol and swtiched to carvedilol    Primary Cardiologist: Dr. [name]  HPI: 72 year old white female followed by Dr. [name] for coronary artery disease. She has a history of hypertension and hyperlipidemia and in December with abnormal EKG she underwent Myoview stress test which revealed inferior lateral ischemia which was new. She had a cardiac catheterization December 2013 revealing 80% proximal LAD lesion, Dr. [name] stinted her with a drug-eluting stent. She also has total dominant RCA and left to right collaterals and normal LV function.  His hypertension is somewhat elevated today stenosis hyperlipidemia this is much improved previous LDL in November 2013 was 224. Currently LDL is 92.  This is an excellent drop as she has multiple problems with statins in the past causing severe myalgia.  Would not increase Crestor.  She has no chest pain. She has no shortness of breath. Her blood pressure is elevated this morning she relates this to having too much coffee prior to the visit. On recheck blood pressure continued 164/80.   Allergies  Allergen Reactions  . Statins Other (See Comments)    "intermittent loss of circulation; hands and arms will go to sleep; feet will get cramps; fatigue" (05/13/2012)    Current Outpatient Prescriptions  Medication Sig Dispense Refill  . aspirin 81 MG tablet Take 81 mg by mouth daily.      . calcium-vitamin D (OSCAL WITH D) 500-200 MG-UNIT per tablet Take 2 tablets by mouth daily.      . carvedilol (COREG) 6.25 MG tablet Take 6.25 mg by mouth 2 (two) times daily with a meal. Four(4) times a week      . Cholecalciferol (VITAMIN D3) 2000 UNITS TABS Take 2,000 Units by mouth 2 (two) times daily.      .  diclofenac sodium (VOLTAREN) 1 % GEL Apply 2 g topically daily as needed. For pain      . ezetimibe (ZETIA) 10 MG tablet Take 1 tablet (10 mg total) by mouth daily.  28 tablet  0  . fish oil-omega-3 fatty acids 1000 MG capsule Take 1.2 g by mouth daily.       Marland Kitchen losartan (COZAAR) 50 MG tablet Take 50 mg by mouth daily.      . Multiple Vitamin (MULTIVITAMIN WITH MINERALS) TABS Take 1 tablet by mouth daily.      . nitroGLYCERIN (NITROSTAT) 0.4 MG SL tablet Place 1 tablet (0.4 mg total) under the tongue every 5 (five) minutes as needed for chest pain.  25 tablet  4  . Omeprazole 20 MG TBEC Take 1 tablet by mouth daily as needed.       . rosuvastatin (CRESTOR) 5 MG tablet Take 5 mg by mouth daily.       . Ticagrelor (BRILINTA) 90 MG TABS tablet Take 1 tablet (90 mg total) by mouth 2 (two) times daily.  40 tablet  0  . rosuvastatin (CRESTOR) 10 MG tablet Take 0.5 tablets (5 mg total) by mouth daily.  28 tablet  0   No current facility-administered medications for this visit.    Past Medical History  Diagnosis Date  . Complication of anesthesia 12/2009    "had endoscopy; larynx went into spasms; stopped  breathing for 15 seconds" (05/13/2012)  . Hypercholesteremia   . Hypertension   . GERD (gastroesophageal reflux disease) 2011  . Arthritis     "right thumb; all my fingers" (05/13/2012)  . CMC arthritis, thumb, degenerative   . Abnormal finding on EKG, new anterolateral T-wave inversions  05/14/2012  . Abnormal nuclear stress test, with infero-lateral ischemia 05/14/2012  . Atypical angina 05/14/2012  . CAD (coronary artery disease), 05/13/12, with 80% LAD 05/14/2012  . S/P angioplasty with stent, LAD 05/13/12 05/14/2012    Past Surgical History  Procedure Laterality Date  . Tonsillectomy and adenoidectomy  1950's  . Inguinal hernia repair  ~ 1966; 07/21/2002    "right; left" (05/13/2012)  . Osteotomy and ulnar shortening  07/21/2002    "right" (05/13/2012)  . Forearm / wrist tendon lesion  excision  ?11/2001    "right; did waver to try to get ulnar out of hole that it had cut" (05/13/2012  . Dilation and curettage of uterus  1960's; 1970's; 1980    "probably 3" (05/13/2012)  . Tubal ligation  1980  . Coronary angioplasty with stent placement  05/13/2012    DES to LAD; she has total RCA with left to rt coll. normal LV function done for positive nuc study    WGN:FAOZHYQ:MV colds or fevers, no weight changes Skin:no rashes or ulcers HEENT:no blurred vision, no congestion CV:see HPI PUL:see HPI GI:no diarrhea constipation or melena, no indigestion GU:no hematuria, no dysuria MS:no joint pain, no claudication Neuro:no syncope, no lightheadedness Endo:no diabetes, no thyroid disease  PHYSICAL EXAM BP 160/88  Pulse 76  Ht 5' 1.5" (1.562 m)  Wt 141 lb 12.8 oz (64.32 kg)  BMI 26.36 kg/m2 General:Pleasant affect, NAD Skin:Warm and dry, brisk capillary refill HEENT:normocephalic, sclera clear, mucus membranes moist Neck:supple, no JVD, no bruits  Heart:S1S2 RRR without murmur, gallup, rub or click Lungs:clear without rales, rhonchi, or wheezes HQI:ONGE, non tender, + BS, do not palpate liver spleen or masses Ext:no lower ext edema, 2+ pedal pulses, 2+ radial pulses Neuro:alert and oriented, MAE, follows commands, + facial symmetry   ASSESSMENT AND PLAN HTN (hypertension) Her blood pressure is elevated this morning and remained so on recheck she relates that it's due to 3 cups of coffee on it he stopped. We discussed changing to have caffeine half decaffeinated coffee. She'll monitor her blood pressure home and call if it remains elevated.  She'll see Dr. [name] back in December to ensure blood pressures controlled she has no other problems.  CAD (coronary artery disease), 05/13/12, with 80% LAD and totally occluded RCA Hx of stent to LAD in Dec. 2013 in setting of positive nuc study.  She has remained stable since that time.  Hyperlipidemia Followed by Dr. Thea Silversmith.  Last labs 01/24/2013 total cholesterol 166, triglycerides 109, HDL 52, LDL 92. While her goal LDL is 70 this is so much improved from November of 2013 when her LDL was 224.  She continues with a heart healthy diet and Crestor,Zetia and fish oil. I am hesitant to increase Crestor secondary to previous significant muscle pain on statins.  S/P angioplasty with stent, LAD 05/13/12 Stable, no chest pain.

## 2013-02-22 NOTE — Patient Instructions (Addendum)
Your physician recommends that you schedule a follow-up appointment at end of December with Dr. [name].

## 2013-02-23 NOTE — Assessment & Plan Note (Signed)
Hx of stent to LAD in Dec. 2013 in setting of positive nuc study.  She has remained stable since that time.

## 2013-02-23 NOTE — Assessment & Plan Note (Signed)
-  Stable, no chest pain °

## 2013-02-23 NOTE — Assessment & Plan Note (Addendum)
Followed by Dr. Thea Silversmith. Last labs 01/24/2013 total cholesterol 166, triglycerides 109, HDL 52, LDL 92. While her goal LDL is 70 this is so much improved from November of 2013 when her LDL was 224.  She continues with a heart healthy diet and Crestor,Zetia and fish oil. I am hesitant to increase Crestor secondary to previous significant muscle pain on statins.

## 2013-02-23 NOTE — Assessment & Plan Note (Signed)
Her blood pressure is elevated this morning and remained so on recheck she relates that it's due to 3 cups of coffee on it he stopped. We discussed changing to have caffeine half decaffeinated coffee. She'll monitor her blood pressure home and call if it remains elevated.  She'll see Dr. [name] back in December to ensure blood pressures controlled she has no other problems.

## 2013-02-25 ENCOUNTER — Encounter: Payer: Self-pay | Admitting: Cardiovascular Disease

## 2013-02-28 DIAGNOSIS — H35369 Drusen (degenerative) of macula, unspecified eye: Secondary | ICD-10-CM | POA: Diagnosis not present

## 2013-02-28 DIAGNOSIS — H52229 Regular astigmatism, unspecified eye: Secondary | ICD-10-CM | POA: Diagnosis not present

## 2013-02-28 DIAGNOSIS — H524 Presbyopia: Secondary | ICD-10-CM | POA: Diagnosis not present

## 2013-02-28 DIAGNOSIS — H5200 Hypermetropia, unspecified eye: Secondary | ICD-10-CM | POA: Diagnosis not present

## 2013-02-28 DIAGNOSIS — H1045 Other chronic allergic conjunctivitis: Secondary | ICD-10-CM | POA: Diagnosis not present

## 2013-03-03 ENCOUNTER — Encounter (HOSPITAL_COMMUNITY): Payer: Self-pay | Admitting: Cardiovascular Disease

## 2013-03-03 ENCOUNTER — Other Ambulatory Visit (HOSPITAL_COMMUNITY): Payer: Self-pay | Admitting: Cardiovascular Disease

## 2013-03-03 ENCOUNTER — Telehealth (HOSPITAL_COMMUNITY): Payer: Self-pay | Admitting: *Deleted

## 2013-03-03 ENCOUNTER — Telehealth: Payer: Self-pay | Admitting: Cardiology

## 2013-03-03 DIAGNOSIS — I700 Atherosclerosis of aorta: Secondary | ICD-10-CM

## 2013-03-03 DIAGNOSIS — R9431 Abnormal electrocardiogram [ECG] [EKG]: Secondary | ICD-10-CM

## 2013-03-03 NOTE — Telephone Encounter (Signed)
Pt would like to speak with Vernona Rieger regarding her bp readings. Please call

## 2013-03-03 NOTE — Telephone Encounter (Signed)
Pt had called wanting to talk with me concerning BP.  I did call back but she did not answer.  I left message I was returning her call.

## 2013-03-04 ENCOUNTER — Telehealth: Payer: Self-pay | Admitting: Cardiology

## 2013-03-04 NOTE — Telephone Encounter (Signed)
Returned patient's call regarding BP readings. Wants RN to inform Vernona Rieger that patient takes medications between 730 & 8 in morning and evening every day and that BP tends to run in low 140s systolic in AM and tends to trend towards 120s-130s in afternoon/evenings. Patient asked if she should take her BP meds a little later in the evenings - advised to try and record BPs. Patient asked that RN have Vernona Rieger, NP call her next week regarding BPs. Patient agreed with plan & verbalized understanding.   BP recordings  10/14  143/80 9am 146/79 3pm 119/73 530pm 138/81 8ish pm  10/15 141/80 720am 122/70 1045am  10/16 141/80 720am 137 systolic 1ish pm 161/09U 8pm

## 2013-03-04 NOTE — Telephone Encounter (Signed)
Tracking her Blood Pressure for Brittany Figueroa. This morning it was 147/80 pulse was 65. The 15th it was 141/80 pulse was 66. The 16th it was 141/80 pulse was 71. All these were in the morning.

## 2013-03-09 ENCOUNTER — Telehealth: Payer: Self-pay | Admitting: Cardiology

## 2013-03-09 ENCOUNTER — Ambulatory Visit (HOSPITAL_COMMUNITY)
Admission: RE | Admit: 2013-03-09 | Discharge: 2013-03-09 | Disposition: A | Discharge status: Home / Self Care | Payer: Medicare Other | Source: Ambulatory Visit | Attending: Cardiovascular Disease | Admitting: Cardiovascular Disease

## 2013-03-09 ENCOUNTER — Encounter (HOSPITAL_COMMUNITY): Payer: Self-pay | Admitting: *Deleted

## 2013-03-09 DIAGNOSIS — I700 Atherosclerosis of aorta: Secondary | ICD-10-CM | POA: Insufficient documentation

## 2013-03-09 NOTE — Progress Notes (Signed)
Abdominal Aortic Duplex Completed °Brianna L Mazza,RVT °

## 2013-03-09 NOTE — Telephone Encounter (Signed)
BP is elevated in the AMs,  but rarely over 150 systolic.  Will continue on current meds.  Discussed with pt. She will see spine specialist tomorrow for neck pain.  Her AM BP elevated due to pain during the night in her neck.  Other times BP 106/70 to 101/67.  If  We increase meds her BP may be too low.  Pt agreeable.

## 2013-03-09 NOTE — Telephone Encounter (Signed)
Please tell Brittany Figueroa her blood pressure is still running high in the morning.Brittany Figueroa told her to let her know how it was doing

## 2013-03-09 NOTE — Telephone Encounter (Signed)
Returned patient's call regarding BPs. Reports systolic is still averaging about 141-143 in AM and BP improves throughout day. Patient also reports that she has chronic excruciating pain in neck (described as degenerative disc, arthritis) and that this pain does not allow patient to sleep well at night. Patient reports that she has taken 500mg  of Tylenol a few times during the day for this pain but is hesitant to take pain medication consistently int he evenings to help ease the pain/sleep. Patient reports Romero Belling, MD wants to do injections on patient, but she will need to come off Brilinta for this.. She said she will talk to Dr. [name] about this at [facility] in December 2014, at which time she says she will have been on Brilinta for 1 year. Patient reports she has been exercising about 3-4 days/week. Patient would like Vernona Rieger to advise on better management of BP.

## 2013-03-10 DIAGNOSIS — M47812 Spondylosis without myelopathy or radiculopathy, cervical region: Secondary | ICD-10-CM | POA: Diagnosis not present

## 2013-03-15 ENCOUNTER — Encounter: Payer: Self-pay | Admitting: *Deleted

## 2013-03-17 ENCOUNTER — Encounter (HOSPITAL_COMMUNITY): Payer: Medicare Other

## 2013-03-20 ENCOUNTER — Encounter: Payer: Self-pay | Admitting: *Deleted

## 2013-03-22 ENCOUNTER — Ambulatory Visit (HOSPITAL_COMMUNITY)
Admission: RE | Admit: 2013-03-22 | Discharge: 2013-03-22 | Disposition: A | Discharge status: Home / Self Care | Payer: Medicare Other | Source: Ambulatory Visit | Attending: Internal Medicine | Admitting: Internal Medicine

## 2013-03-22 ENCOUNTER — Telehealth: Payer: Self-pay | Admitting: Cardiovascular Disease

## 2013-03-22 ENCOUNTER — Other Ambulatory Visit: Payer: Self-pay | Admitting: *Deleted

## 2013-03-22 DIAGNOSIS — R9431 Abnormal electrocardiogram [ECG] [EKG]: Secondary | ICD-10-CM | POA: Insufficient documentation

## 2013-03-22 MED ORDER — TICAGRELOR 90 MG PO TABS: 90.0000 mg | ORAL_TABLET | Freq: Two times a day (BID) | ORAL | Status: DC

## 2013-03-22 MED ORDER — TECHNETIUM TC 99M SESTAMIBI GENERIC - CARDIOLITE
32.0000 | Freq: Once | INTRAVENOUS | Status: AC | PRN
  Administered 2013-03-22: 32 via INTRAVENOUS

## 2013-03-22 MED ORDER — REGADENOSON 0.4 MG/5ML IV SOLN
0.4000 mg | Freq: Once | INTRAVENOUS | Status: AC
  Administered 2013-03-22: 0.4 mg via INTRAVENOUS

## 2013-03-22 MED ORDER — EZETIMIBE 10 MG PO TABS: 10.0000 mg | ORAL_TABLET | Freq: Every day | ORAL | Status: DC

## 2013-03-22 MED ORDER — TECHNETIUM TC 99M SESTAMIBI GENERIC - CARDIOLITE
10.8000 | Freq: Once | INTRAVENOUS | Status: AC | PRN
  Administered 2013-03-22: 11 via INTRAVENOUS

## 2013-03-22 NOTE — Telephone Encounter (Signed)
Coming in for Stress Test today around 1:15.She would like some samples of Brilinta 90 mg and Zetia 10 mg please.

## 2013-03-22 NOTE — Procedures (Addendum)
[facility] [address] [place] [address] [place] [address] [phone]  Cardiology Nuclear Med Study  Brittany Figueroa is a 72 y.o. female     MRN : [id number]     DOB: 1941-01-08  Procedure Date: 03/22/2013  Nuclear Med Background Indication for Stress Test:  Stent Patency and Abnormal EKG History:  CAD;STENT/PTCA--04/2012 Cardiac Risk Factors: Family History - CAD, History of Smoking, Hypertension, Lipids, Overweight and PVD  Symptoms:  Dizziness, Fatigue and SOB   Nuclear Pre-Procedure Caffeine/Decaff Intake:  1:00am NPO After: 11AM   IV Site: R Hand  IV 0.9% NS with Angio Cath:  22g  Chest Size (in):  N/A IV Started by: Emmit Pomfret, RN  Height: 5' 1.5" (1.562 m)  Cup Size: D  BMI:  Body mass index is 26.21 kg/(m^2). Weight:  141 lb (63.957 kg)   Tech Comments:  N/A    Nuclear Med Study 1 or 2 day study: 1 day  Stress Test Type:  Lexiscan  Order Authorizing Provider:  Nanetta Batty, MD   Resting Radionuclide: Technetium 10m Sestamibi  Resting Radionuclide Dose: 10.8 mCi   Stress Radionuclide:  Technetium 74m Sestamibi  Stress Radionuclide Dose: 32.0 mCi           Stress Protocol Rest HR: 74 Stress HR: 98  Rest BP: 145/78 Stress BP: 149/69  Exercise Time (min): n/a METS: n/a   Predicted Max HR: 150 bpm % Max HR: 65.33 bpm Rate Pressure Product: 02725  Dose of Adenosine (mg):  n/a Dose of Lexiscan: 0.4 mg  Dose of Atropine (mg): n/a Dose of Dobutamine: n/a mcg/kg/min (at max HR)  Stress Test Technologist: Esperanza Sheets, CCT Nuclear Technologist: Gonzella Lex, CNMT   Rest Procedure:  Myocardial perfusion imaging was performed at rest 45 minutes following the intravenous administration of Technetium 84m Sestamibi. Stress Procedure:  The patient received IV Lexiscan 0.4 mg over 15-seconds.  Technetium 12m Sestamibi injected at 30-seconds.  There were no significant changes with Lexiscan.  Quantitative spect images were  obtained after a 45 minute delay.  Transient Ischemic Dilatation (Normal <1.22):  1.12 Lung/Heart Ratio (Normal <0.45):  0.33 QGS EDV:  71 ml QGS ESV:  27 ml LV Ejection Fraction: 62%  Signed by     Rest ECG: NSR - Normal EKG; small Q wave in avL.  Stress ECG: No significant ST segment change suggestive of ischemia; frequent PAC's and PVC's in recovery with atrial bigeminy and venticular bigeminy.  QPS Raw Data Images:  Mild motion artifact; normal heart/lung ratio;  Stress Images:  Moderate in size and intensity inferoseptal mid to basal defect. Rest Images:  Normal homogeneous uptake in all areas of the myocardium. Subtraction (SDS):  Ischemia in the mid to basal inferior to septal inferior wall.  Impression Exercise Capacity:  Lexiscan without stress. BP Response:  Normal blood pressure response. Clinical Symptoms:  No chest pain; mild shortnesss of breath. ECG Impression:  No significant ST segment change suggestive of ischemia. Comparison with Prior Nuclear Study: No significant change from prior study.  Overall Impression:  Intermediate risk stress nuclear study demonstrating moderate mid to basal inferior to septal inferior ischemia concordant with the patient's known mid RCA occlusion with collaterals.   LV Wall Motion:  NL LV Function, EF 62%; NL Wall Motion   Quantae Martel A, MD  03/22/2013 5:56 PM

## 2013-03-22 NOTE — Telephone Encounter (Signed)
Returned call and pt informed samples left at front desk.  Pt verbalized understanding and agreed w/ plan.    

## 2013-03-24 ENCOUNTER — Other Ambulatory Visit: Payer: Self-pay

## 2013-03-27 ENCOUNTER — Encounter: Payer: Self-pay | Admitting: *Deleted

## 2013-04-01 DIAGNOSIS — H8110 Benign paroxysmal vertigo, unspecified ear: Secondary | ICD-10-CM | POA: Diagnosis not present

## 2013-04-11 ENCOUNTER — Other Ambulatory Visit: Payer: Self-pay | Admitting: *Deleted

## 2013-04-11 ENCOUNTER — Telehealth: Payer: Self-pay | Admitting: Cardiovascular Disease

## 2013-04-11 MED ORDER — EZETIMIBE 10 MG PO TABS: 10.0000 mg | ORAL_TABLET | Freq: Every day | ORAL | Status: DC

## 2013-04-11 MED ORDER — TICAGRELOR 90 MG PO TABS: 90.0000 mg | ORAL_TABLET | Freq: Two times a day (BID) | ORAL | Status: DC

## 2013-04-11 NOTE — Telephone Encounter (Signed)
Returned call.  Left message to call back before 4pm.  

## 2013-04-11 NOTE — Telephone Encounter (Signed)
Returning your call. °

## 2013-04-11 NOTE — Telephone Encounter (Signed)
Returned call and pt verified x 2.  Pt stated her BP machine has been showing her BP is running high.  Pt reports BP 150s-170s/80s-90s and checking BP 2-4 times daily.  Pt advised to avoid checking BP >twice daily unless symptomatic.  Pt scheduled for BP check w/ pharmacist tomorrow at 3:10pm to check her machine against our reading, per pt request.  Pt will bring in her monitor to appt.  Pt agreed w/ this plan.

## 2013-04-11 NOTE — Telephone Encounter (Signed)
Saw Brittany Figueroa in October and has tracked her BP since then.  Has been running very high (providing her machine is working right)  Needs a nurse to call to see if she needs to come in or what.  Please call

## 2013-04-12 ENCOUNTER — Ambulatory Visit (INDEPENDENT_AMBULATORY_CARE_PROVIDER_SITE_OTHER): Payer: Medicare Other | Admitting: Pharmacist Clinician (PhC)/ Clinical Pharmacy Specialist

## 2013-04-12 VITALS — BP 138/80 | HR 76

## 2013-04-12 DIAGNOSIS — I10 Essential (primary) hypertension: Secondary | ICD-10-CM | POA: Diagnosis not present

## 2013-04-12 NOTE — Patient Instructions (Signed)
Your blood pressure today is good at 138/80. Check your blood pressure at home daily (if able) and keep record of the readings.  Take your BP meds as follows:  Take losartan 1/2 tablet twice daily until gone, then switch to irbesartan 150mg  each morning  Bring all of your meds, and your record of home blood pressures to your next appointment.  Exercise as you're able, try to walk approximately 30 minutes per day.  Keep salt intake to a minimum, especially watch canned and prepared boxed foods.  Eat more fresh fruits and vegetables and fewer canned items.  Avoid eating in fast food restaurants.   HOW TO TAKE YOUR BLOOD PRESSURE:   Rest 5 minutes before taking your blood pressure.    Don't smoke or drink caffeinated beverages for at least 30 minutes before.   Take your blood pressure before (not after) you eat.   Sit comfortably with your back supported and both feet on the floor (don't cross your legs).   Elevate your arm to heart level on a table or a desk.   Use the proper sized cuff. It should fit smoothly and snugly around your bare upper arm. There should be enough room to slip a fingertip under the cuff. The bottom edge of the cuff should be 1 inch above the crease of the elbow.   Ideally, take 3 measurements at one sitting and record the average.

## 2013-04-13 ENCOUNTER — Encounter: Payer: Self-pay | Admitting: Pharmacist Clinician (PhC)/ Clinical Pharmacy Specialist

## 2013-04-13 DIAGNOSIS — M542 Cervicalgia: Secondary | ICD-10-CM | POA: Diagnosis not present

## 2013-04-13 DIAGNOSIS — M47812 Spondylosis without myelopathy or radiculopathy, cervical region: Secondary | ICD-10-CM | POA: Diagnosis not present

## 2013-04-13 MED ORDER — IRBESARTAN 150 MG PO TABS: 150.0000 mg | ORAL_TABLET | Freq: Every day | ORAL | Status: DC

## 2013-04-13 NOTE — Progress Notes (Signed)
04/13/2013 GWYN HIERONYMUS [birth year] [id number]   HPI:  DANIJAH NOH is a 72 y.o. female patient of Dr [name], with a PMH below who presents today for a blood pressure check.  She recently saw Nada Boozer and had a pressure of 160/88.  Pt currently takes carvedilol 6.25mg  bid and losartan 50mg  qam.  She states compliance with medications.  She has had some pain issues in the past several weeks, described as degenerative disc disease, which the patient described as "excruciating".  She takes her BP multiple times per day (3 times each time then averages the scores) with an Omron cuff that she bought several years ago.  She keeps her recordings in a notebook that she brought with her today, along with her cuff.  She has noticed that her pressures tend to be higher in the mornings (140-170s systolic), and by mid afternoon/evening they are mostly 120-140s.  Pt quit smoking in 1990, has cut back on caffeine over the past month, and drinks wine with dinner regularly.  [facility] min on [facility]/treadmill), but has not done this recently due to neck pain and vertigo.     Current Outpatient Prescriptions  Medication Sig Dispense Refill  . aspirin 81 MG tablet Take 81 mg by mouth daily.      . calcium-vitamin D (OSCAL WITH D) 500-200 MG-UNIT per tablet Take 2 tablets by mouth daily.      . carvedilol (COREG) 6.25 MG tablet Take 6.25 mg by mouth 2 (two) times daily with a meal.       . Cholecalciferol (VITAMIN D3) 2000 UNITS TABS Take 2,000 Units by mouth 2 (two) times daily.      . diclofenac sodium (VOLTAREN) 1 % GEL Apply 2 g topically daily as needed. For pain      . ezetimibe (ZETIA) 10 MG tablet Take 1 tablet (10 mg total) by mouth daily.  14 tablet  0  . fish oil-omega-3 fatty acids 1000 MG capsule Take 1.2 g by mouth daily.       Marland Kitchen losartan (COZAAR) 50 MG tablet Take 50 mg by mouth daily.      . Multiple Vitamin (MULTIVITAMIN WITH MINERALS) TABS Take 1  tablet by mouth daily.      . nitroGLYCERIN (NITROSTAT) 0.4 MG SL tablet Place 1 tablet (0.4 mg total) under the tongue every 5 (five) minutes as needed for chest pain.  25 tablet  4  . Omeprazole 20 MG TBEC Take 1 tablet by mouth daily as needed.       . rosuvastatin (CRESTOR) 10 MG tablet Take 0.5 tablets (5 mg total) by mouth daily.  28 tablet  0  . rosuvastatin (CRESTOR) 5 MG tablet Take 5 mg by mouth daily.       . Ticagrelor (BRILINTA) 90 MG TABS tablet Take 1 tablet (90 mg total) by mouth 2 (two) times daily.  64 tablet  0   No current facility-administered medications for this visit.    Allergies  Allergen Reactions  . Statins Other (See Comments)    "intermittent loss of circulation; hands and arms will go to sleep; feet will get cramps; fatigue" (05/13/2012)    Past Medical History  Diagnosis Date  . Complication of anesthesia 12/2009    "had endoscopy; larynx went into spasms; stopped breathing for 15 seconds" (05/13/2012)  . Hypercholesteremia   . Hypertension   . GERD (gastroesophageal reflux disease) 2011  .  Arthritis     "right thumb; all my fingers" (05/13/2012)  . CMC arthritis, thumb, degenerative   . Abnormal finding on EKG, new anterolateral T-wave inversions  05/14/2012  . Abnormal nuclear stress test, with infero-lateral ischemia 05/14/2012  . Atypical angina 05/14/2012  . CAD (coronary artery disease), 05/13/12, with 80% LAD 05/14/2012  . S/P angioplasty with stent, LAD 05/13/12 05/14/2012    Blood pressure 138/80, pulse 76.   ASSESSMENT AND PLAN:  We compared Ms. Buxon's home BP cuff to that of ours in the office and found it to be quite accurate (<10 mmHg)).  I suspect that most of her abnormally high readings (170s systolic) are due to the pain issues she is currently facing.  She hopes to have surgery soon, but needs to be on the Brilinta > 1year before that can happen.  I also suspect that the losartan is not lasting a full 24 hours for her and thus her  readings are higher each morning.  We will switch her to irbesartan 150mg  daily, which has a longer duration of action in most patients.  Pt is slightly hesitant to switch meds, stating she has had reactions to other BP meds in the past, but there are none listed in her allergy profile, nor can she recall names or reactions.  I have assured her that irbesartan should cause no problems if she can tolerate losartan.  She has a scheduled appointment with Dr. [name] in December, we will see at that point if her pressure stays WNL.  I have also explained to patient that the hypertension guidelines state that for her age <150/90 is an acceptable BP, so she really doesn't have to worry so much.  Also advised that she take her pressure only once per day, in the AM.  Phillips Hay PharmD CPP [facility]

## 2013-04-18 DIAGNOSIS — M47812 Spondylosis without myelopathy or radiculopathy, cervical region: Secondary | ICD-10-CM | POA: Diagnosis not present

## 2013-04-18 DIAGNOSIS — M5030 Other cervical disc degeneration, unspecified cervical region: Secondary | ICD-10-CM | POA: Diagnosis not present

## 2013-04-18 DIAGNOSIS — M542 Cervicalgia: Secondary | ICD-10-CM | POA: Diagnosis not present

## 2013-04-20 DIAGNOSIS — M542 Cervicalgia: Secondary | ICD-10-CM | POA: Diagnosis not present

## 2013-04-20 DIAGNOSIS — M5126 Other intervertebral disc displacement, lumbar region: Secondary | ICD-10-CM | POA: Diagnosis not present

## 2013-04-22 DIAGNOSIS — M542 Cervicalgia: Secondary | ICD-10-CM | POA: Diagnosis not present

## 2013-04-22 DIAGNOSIS — M5126 Other intervertebral disc displacement, lumbar region: Secondary | ICD-10-CM | POA: Diagnosis not present

## 2013-04-26 DIAGNOSIS — M5126 Other intervertebral disc displacement, lumbar region: Secondary | ICD-10-CM | POA: Diagnosis not present

## 2013-04-26 DIAGNOSIS — M542 Cervicalgia: Secondary | ICD-10-CM | POA: Diagnosis not present

## 2013-04-28 ENCOUNTER — Encounter: Payer: Self-pay | Admitting: Gastroenterology

## 2013-04-28 DIAGNOSIS — M5126 Other intervertebral disc displacement, lumbar region: Secondary | ICD-10-CM | POA: Diagnosis not present

## 2013-04-28 DIAGNOSIS — M542 Cervicalgia: Secondary | ICD-10-CM | POA: Diagnosis not present

## 2013-05-02 DIAGNOSIS — M542 Cervicalgia: Secondary | ICD-10-CM | POA: Diagnosis not present

## 2013-05-02 DIAGNOSIS — M5126 Other intervertebral disc displacement, lumbar region: Secondary | ICD-10-CM | POA: Diagnosis not present

## 2013-05-03 DIAGNOSIS — M899 Disorder of bone, unspecified: Secondary | ICD-10-CM | POA: Diagnosis not present

## 2013-05-03 DIAGNOSIS — R7301 Impaired fasting glucose: Secondary | ICD-10-CM | POA: Diagnosis not present

## 2013-05-03 DIAGNOSIS — I10 Essential (primary) hypertension: Secondary | ICD-10-CM | POA: Diagnosis not present

## 2013-05-09 DIAGNOSIS — E785 Hyperlipidemia, unspecified: Secondary | ICD-10-CM | POA: Diagnosis not present

## 2013-05-09 DIAGNOSIS — R7301 Impaired fasting glucose: Secondary | ICD-10-CM | POA: Diagnosis not present

## 2013-05-09 DIAGNOSIS — I10 Essential (primary) hypertension: Secondary | ICD-10-CM | POA: Diagnosis not present

## 2013-05-09 DIAGNOSIS — I2510 Atherosclerotic heart disease of native coronary artery without angina pectoris: Secondary | ICD-10-CM | POA: Diagnosis not present

## 2013-05-10 DIAGNOSIS — E785 Hyperlipidemia, unspecified: Secondary | ICD-10-CM | POA: Diagnosis not present

## 2013-05-10 DIAGNOSIS — I2510 Atherosclerotic heart disease of native coronary artery without angina pectoris: Secondary | ICD-10-CM | POA: Diagnosis not present

## 2013-05-10 DIAGNOSIS — I10 Essential (primary) hypertension: Secondary | ICD-10-CM | POA: Diagnosis not present

## 2013-05-10 DIAGNOSIS — R7301 Impaired fasting glucose: Secondary | ICD-10-CM | POA: Diagnosis not present

## 2013-05-10 DIAGNOSIS — Z23 Encounter for immunization: Secondary | ICD-10-CM | POA: Diagnosis not present

## 2013-05-16 ENCOUNTER — Ambulatory Visit: Payer: Medicare Other | Admitting: Cardiovascular Disease

## 2013-05-17 ENCOUNTER — Encounter: Payer: Self-pay | Admitting: Cardiovascular Disease

## 2013-05-17 ENCOUNTER — Ambulatory Visit (INDEPENDENT_AMBULATORY_CARE_PROVIDER_SITE_OTHER): Payer: Medicare Other | Admitting: Cardiovascular Disease

## 2013-05-17 ENCOUNTER — Telehealth: Payer: Self-pay | Admitting: Gastroenterology

## 2013-05-17 VITALS — BP 138/60 | HR 64 | Ht 61.5 in | Wt 144.0 lb

## 2013-05-17 DIAGNOSIS — I2510 Atherosclerotic heart disease of native coronary artery without angina pectoris: Secondary | ICD-10-CM | POA: Diagnosis not present

## 2013-05-17 DIAGNOSIS — I10 Essential (primary) hypertension: Secondary | ICD-10-CM | POA: Diagnosis not present

## 2013-05-17 DIAGNOSIS — Z9889 Other specified postprocedural states: Secondary | ICD-10-CM | POA: Diagnosis not present

## 2013-05-17 DIAGNOSIS — Z79899 Other long term (current) drug therapy: Secondary | ICD-10-CM | POA: Diagnosis not present

## 2013-05-17 DIAGNOSIS — M9981 Other biomechanical lesions of cervical region: Secondary | ICD-10-CM | POA: Diagnosis not present

## 2013-05-17 DIAGNOSIS — Z95820 Peripheral vascular angioplasty status with implants and grafts: Secondary | ICD-10-CM

## 2013-05-17 DIAGNOSIS — E785 Hyperlipidemia, unspecified: Secondary | ICD-10-CM

## 2013-05-17 DIAGNOSIS — M5030 Other cervical disc degeneration, unspecified cervical region: Secondary | ICD-10-CM | POA: Diagnosis not present

## 2013-05-17 MED ORDER — CLOPIDOGREL BISULFATE 75 MG PO TABS: 75.0000 mg | ORAL_TABLET | Freq: Every day | ORAL | Status: DC

## 2013-05-17 NOTE — Progress Notes (Signed)
05/17/2013 Brittany Figueroa   12/13/1940  [id number]  Primary Physician Thayer Headings, MD Primary Cardiologist: Runell Gess MD Roseanne Reno   HPI:  The patient is a 72 year old mildly overweight divorced Caucasian female, mother to 2, grandmother to 3 grandchildren, who I last saw 3 months ago. She has a history of hypertension and hyperlipidemia. I saw her in December of 2013 with new anterolateral T-wave inversion and a Myoview that showed inferolateral ischemia that was new compared to a prior study. She did have some unusual symptoms at that time. Based on this, I catheterized her on May 13, 2012, revealing an 80% proximal LAD lesion, which I stented using a drug-eluting stent, as well as a total dominant RCA with left to right collaterals and normal LV function. Since stenting her LAD, she is ultimately asymptomatic. Her other problems include hypertension and hyperlipidemia. I have reviewed her blood pressures, which were recorded during cardiac rehab, which were all normal. Her recent blood work revealed a total cholesterol of 161, LDL of 89 and HDL of 40.she denies chest pain or shortness of breath. She had a recent Myoview stress test performed several months ago that showed ischemia in the RCA territory but otherwise unremarkable is explainable by her known total dominant RCA with left to right collaterals. Chief complaint of excruciating neck pain thought to be related to cervical disc disease. She requires steroid injection and would need to come off of Dilantin therapy if that were to happen which I think she can do at low risk. If this is unsuccessful she will be cleared for cervical decompression/neurosurgery at local vascular risk with the intent to start her back on aspirin Plavix thereafter.   Current Outpatient Prescriptions  Medication Sig Dispense Refill  . aspirin 81 MG tablet Take 81 mg by mouth daily.      . calcium-vitamin D (OSCAL WITH D) 500-200  MG-UNIT per tablet Take 2 tablets by mouth daily.      . carvedilol (COREG) 6.25 MG tablet Take 6.25 mg by mouth 2 (two) times daily with a meal.       . Cholecalciferol (VITAMIN D3) 2000 UNITS TABS Take 2,000 Units by mouth 2 (two) times daily.      . diclofenac sodium (VOLTAREN) 1 % GEL Apply 2 g topically daily as needed. For pain      . ezetimibe (ZETIA) 10 MG tablet Take 1 tablet (10 mg total) by mouth daily.  14 tablet  0  . fish oil-omega-3 fatty acids 1000 MG capsule Take 1.2 g by mouth daily.       . irbesartan (AVAPRO) 150 MG tablet Take 1 tablet (150 mg total) by mouth daily.  30 tablet  1  . meclizine (ANTIVERT) 25 MG tablet Take 25 mg by mouth as needed.       . Multiple Vitamin (MULTIVITAMIN WITH MINERALS) TABS Take 1 tablet by mouth daily.      . nitroGLYCERIN (NITROSTAT) 0.4 MG SL tablet Place 1 tablet (0.4 mg total) under the tongue every 5 (five) minutes as needed for chest pain.  25 tablet  4  . rosuvastatin (CRESTOR) 5 MG tablet Take 5 mg by mouth daily.       . Ticagrelor (BRILINTA) 90 MG TABS tablet Take 1 tablet (90 mg total) by mouth 2 (two) times daily.  64 tablet  0  . clopidogrel (PLAVIX) 75 MG tablet Take 1 tablet (75 mg total) by mouth daily.  90 tablet  3  No current facility-administered medications for this visit.    Allergies  Allergen Reactions  . Statins Other (See Comments)    "intermittent loss of circulation; hands and arms will go to sleep; feet will get cramps; fatigue" (05/13/2012)    History   Social History  . Marital Status: Divorced    Spouse Name: N/A    Number of Children: N/A  . Years of Education: N/A   Occupational History  . Not on file.   Social History Main Topics  . Smoking status: Former Smoker -- 0.50 packs/day for 20 years    Types: Cigarettes    Quit date: 07/05/1988  . Smokeless tobacco: Never Used  . Alcohol Use: 7.2 oz/week    12 Glasses of wine per week     Comment: 05/13/2012 "6-8 oz glass of red wine q hs"   .  Drug Use: No  . Sexual Activity: No   Other Topics Concern  . Not on file   Social History Narrative  . No narrative on file     Review of Systems: General: negative for chills, fever, night sweats or weight changes.  Cardiovascular: negative for chest pain, dyspnea on exertion, edema, orthopnea, palpitations, paroxysmal nocturnal dyspnea or shortness of breath Dermatological: negative for rash Respiratory: negative for cough or wheezing Urologic: negative for hematuria Abdominal: negative for nausea, vomiting, diarrhea, bright red blood per rectum, melena, or hematemesis Neurologic: negative for visual changes, syncope, or dizziness All other systems reviewed and are otherwise negative except as noted above.    Blood pressure 138/60, pulse 64, height 5' 1.5" (1.562 m), weight 144 lb (65.318 kg).  General appearance: alert and no distress Neck: no adenopathy, no carotid bruit, no JVD, supple, symmetrical, trachea midline and thyroid not enlarged, symmetric, no tenderness/mass/nodules Lungs: clear to auscultation bilaterally Heart: regular rate and rhythm, S1, S2 normal, no murmur, click, rub or gallop Extremities: extremities normal, atraumatic, no cyanosis or edema  EKG stomal sinus rhythm at 64 with left anterior fascicular block and mild QRS widening  ASSESSMENT AND PLAN:   S/P angioplasty with stent, LAD 05/13/12 Status post FFR guided  proximal LAD PCI and stenting with a drug-eluting stent by myself 05/13/12. She had a Myoview stress test performed recently that showed inferior ischemia related to her known occluded RCA with left-to-right collaterals but otherwise no significant changes. She denies chest pain or shortness of breath and is on dual antiplatelet therapy, low dose aspirin and Brilenta.I believe she can be transitioned to Plavix with a subsequent verifying now P2Y12 t test.she can stop her chemotherapy at minimal risk as it has been a year since her stent implant  planned for cervical injection for neck pain and/or surgery.  HTN (hypertension) Under good control and her medications  Hyperlipidemia On statin therapy as well as Zetia with recent blood work performed by her primary care physician 05/03/13 total cholesterol 161, LDL of 89 and HDL of 40      Runell Gess MD [facility], [facility] 05/17/2013 11:05 AM

## 2013-05-17 NOTE — Assessment & Plan Note (Addendum)
Status post FFR guided  proximal LAD PCI and stenting with a drug-eluting stent by myself 05/13/12. She had a Myoview stress test performed recently that showed inferior ischemia related to her known occluded RCA with left-to-right collaterals but otherwise no significant changes. She denies chest pain or shortness of breath and is on dual antiplatelet therapy, low dose aspirin and Brilenta.I believe she can be transitioned to Plavix with a subsequent verifying now P2Y12 t test.she can stop her chemotherapy at minimal risk as it has been a year since her stent implant planned for cervical injection for neck pain and/or surgery.

## 2013-05-17 NOTE — Telephone Encounter (Signed)
Patient advised she will need an office visit with the APP to discuss and arrange colonoscopy.  She needs to coordinate colon with a spinal injection. She is advised to bring possible dates so we can try and coordinate the appts.

## 2013-05-17 NOTE — Patient Instructions (Signed)
Your physician wants you to follow-up in: 3 months with an extender and 6 months with Dr [name].  You will receive a reminder letter in the mail two months in advance. If you don't receive a letter, please call our office to schedule the follow-up appointment.  Dr [name] has authorized you to stop the Brilinita 1 week prior to the neck injection.  Stay off of the Brilinta.  If the injections work, start Plavix 75mg  when the doctor that did the injections says that it is okay to resume the blood thinner.  Plavix will take the place of Brilinta.  After you have been on the Plavix for 2 weeks, have blood work done to make sure that the Plavix is doing what it is supposed to do.    If the injections do not work and you require surgery, stay off the Brilinta and Plavix until after the surgery.  Once the surgery is completed and it is safe to resume the blood thinner, start Plavix 75mg  daily.  After you have been on the Plavix for 2 weeks have the blood work done to see if the Plavix is working.    Dr [name] has cleared you to proceed with the neck injections and/or neck surgery.

## 2013-05-17 NOTE — Assessment & Plan Note (Signed)
On statin therapy as well as Zetia with recent blood work performed by her primary care physician 05/03/13 total cholesterol 161, LDL of 89 and HDL of 40

## 2013-05-17 NOTE — Assessment & Plan Note (Signed)
Under good control and her medications 

## 2013-05-19 ENCOUNTER — Other Ambulatory Visit (HOSPITAL_COMMUNITY): Payer: Self-pay | Admitting: Cardiology

## 2013-05-20 NOTE — Telephone Encounter (Signed)
Rx was sent to pharmacy electronically. 

## 2013-05-23 ENCOUNTER — Other Ambulatory Visit (HOSPITAL_COMMUNITY): Payer: Self-pay | Admitting: Cardiology

## 2013-05-23 DIAGNOSIS — M9981 Other biomechanical lesions of cervical region: Secondary | ICD-10-CM | POA: Diagnosis not present

## 2013-05-23 DIAGNOSIS — M5030 Other cervical disc degeneration, unspecified cervical region: Secondary | ICD-10-CM | POA: Diagnosis not present

## 2013-05-25 ENCOUNTER — Telehealth: Payer: Self-pay | Admitting: *Deleted

## 2013-05-25 ENCOUNTER — Telehealth: Payer: Self-pay | Admitting: Cardiovascular Disease

## 2013-05-25 ENCOUNTER — Ambulatory Visit (INDEPENDENT_AMBULATORY_CARE_PROVIDER_SITE_OTHER): Payer: Medicare Other | Admitting: Nurse Practitioner

## 2013-05-25 ENCOUNTER — Encounter: Payer: Self-pay | Admitting: Nurse Practitioner

## 2013-05-25 VITALS — BP 130/60 | HR 72 | Ht 61.0 in | Wt 147.0 lb

## 2013-05-25 DIAGNOSIS — Z1211 Encounter for screening for malignant neoplasm of colon: Secondary | ICD-10-CM | POA: Diagnosis not present

## 2013-05-25 DIAGNOSIS — M9981 Other biomechanical lesions of cervical region: Secondary | ICD-10-CM | POA: Diagnosis not present

## 2013-05-25 DIAGNOSIS — I2510 Atherosclerotic heart disease of native coronary artery without angina pectoris: Secondary | ICD-10-CM

## 2013-05-25 DIAGNOSIS — M5030 Other cervical disc degeneration, unspecified cervical region: Secondary | ICD-10-CM | POA: Diagnosis not present

## 2013-05-25 MED ORDER — MOVIPREP 100 G PO SOLR: 1.0000 | ORAL | Status: DC

## 2013-05-25 MED ORDER — TICAGRELOR 90 MG PO TABS: ORAL_TABLET | ORAL | Status: DC

## 2013-05-25 NOTE — Patient Instructions (Signed)
You have been scheduled for a colonoscopy with propofol. Please follow written instructions given to you at your visit today.  Please pick up your prep kit at the pharmacy within the next 1-3 days.  We sent the prescription to [facility] to [facility].   We will verify with Dr. Gwenlyn Found that you can hold the Brilinta for 7 days prior to the procedure date.

## 2013-05-25 NOTE — Telephone Encounter (Signed)
05/25/2013   RE: Brittany Figueroa DOB: 23-Mar-1941 MRN: [id number]   Dear Dr. Quay Burow,    We have scheduled the above patient for an endoscopic procedure. Our records show that she is on anticoagulation therapy.   Please advise as to how long the patient may come off her therapy of Brilinta prior to the procedure, which is scheduled for 06-29-2013. The patient said she was told by someone at your office that she can hold Brilinta for 7 days prior to the procedure date.   Please fax back/ or route the completed form to [facility] at [phone]  Sincerely,    Tye Savoy ACNP

## 2013-05-25 NOTE — Telephone Encounter (Signed)
Spoke to patient.She states that Brilinta has to continue until she co-ordinates with GI and neurosurgeon concerning her procedures.After that she will change to Plavix. She states it will cost her $270 for Brilinta.  Informed patient that samples are available. Voiced understanding.   Samples are at the front desk.

## 2013-05-25 NOTE — Telephone Encounter (Signed)
Wants to get samples of Brilinta 90 mg twice daily.  After her procedure will be put on plavix.   Cannot afford at pharmacy.  Please call

## 2013-05-25 NOTE — Progress Notes (Signed)
HPI :  Patient is a 73 year old female known remotely to Dr. Fuller Plan. She had a normal colonoscopy July 2004, done for evaluation of abdominal pain and bloating. Patient is due for another screening colonoscopy. She is on Brillinta for LAD stent placed December 2013.  Patient spoke to her cardiologist about holding [place] for colonoscopy.While off Brillinta she also wants to arrange for a neck injection.  She has no gastrointestinal complaints.   Past Medical History  Diagnosis Date  . Complication of anesthesia 12/2009    "had endoscopy; larynx went into spasms; stopped breathing for 15 seconds" (05/13/2012)  . Hypercholesteremia   . Hypertension   . GERD (gastroesophageal reflux disease) 2011  . Arthritis     "right thumb; all my fingers" (05/13/2012)  . CMC arthritis, thumb, degenerative   . Abnormal finding on EKG, new anterolateral T-wave inversions  05/14/2012  . Abnormal nuclear stress test, with infero-lateral ischemia 05/14/2012  . Atypical angina 05/14/2012  . CAD (coronary artery disease), 05/13/12, with 80% LAD 05/14/2012  . S/P angioplasty with stent, LAD 05/13/12 05/14/2012   Family History  Problem Relation Age of Onset  . Hypertension Mother   . Diabetes Mother   . Hyperlipidemia Father   . Heart disease Father   . Stroke Father   . Heart disease Sister   . Diabetes Sister   . Heart disease Brother    History  Substance Use Topics  . Smoking status: Former Smoker -- 0.50 packs/day for 20 years    Types: Cigarettes    Quit date: 07/05/1988  . Smokeless tobacco: Never Used  . Alcohol Use: 7.2 oz/week    12 Glasses of wine per week     Comment: 05/13/2012 "6-8 oz glass of red wine q hs"    Current Outpatient Prescriptions  Medication Sig Dispense Refill  . aspirin 81 MG tablet Take 81 mg by mouth daily.      . calcium-vitamin D (OSCAL WITH D) 500-200 MG-UNIT per tablet Take 2 tablets by mouth daily.      . carvedilol (COREG) 6.25 MG tablet Take 6.25 mg  by mouth 2 (two) times daily with a meal.       . Cholecalciferol (VITAMIN D3) 2000 UNITS TABS Take 2,000 Units by mouth 2 (two) times daily.      . diclofenac sodium (VOLTAREN) 1 % GEL Apply 2 g topically daily as needed. For pain      . ezetimibe (ZETIA) 10 MG tablet Take 1 tablet (10 mg total) by mouth daily.  14 tablet  0  . fish oil-omega-3 fatty acids 1000 MG capsule Take 1.2 g by mouth daily.       . irbesartan (AVAPRO) 150 MG tablet Take 1 tablet (150 mg total) by mouth daily.  30 tablet  1  . Multiple Vitamin (MULTIVITAMIN WITH MINERALS) TABS Take 1 tablet by mouth daily.      Marland Kitchen NITROSTAT 0.4 MG SL tablet PLACE 1 TABLET UNDER THE TONGUE EVERY 5 MINUTES AS NEEDED FOR CHEST PAIN  25 tablet  3  . rosuvastatin (CRESTOR) 5 MG tablet Take 5 mg by mouth daily.       . Ticagrelor (BRILINTA) 90 MG TABS tablet Take 1 tablet (90 mg total) by mouth 2 (two) times daily.  64 tablet  0  . clopidogrel (PLAVIX) 75 MG tablet Take 1 tablet (75 mg total) by mouth daily.  90 tablet  3  . MOVIPREP 100 G SOLR Take 1 kit (200 g  total) by mouth as directed.  1 kit  0   No current facility-administered medications for this visit.   Allergies  Allergen Reactions  . Statins Other (See Comments)    "intermittent loss of circulation; hands and arms will go to sleep; feet will get cramps; fatigue" (05/13/2012)    Review of Systems: All systems reviewed and negative except where noted in HPI.   Physical Exam: BP 130/60  Pulse 72  Ht 5' 1"  (1.549 m)  Wt 147 lb (66.679 kg)  BMI 27.79 kg/m2 Constitutional: Pleasant,well-developed, white female in no acute distress. HEENT: Normocephalic and atraumatic. Conjunctivae are normal. No scleral icterus. Neck supple.  Cardiovascular: Normal rate, regular rhythm.  Pulmonary/chest: Effort normal and breath sounds normal. No wheezing, rales or rhonchi. Abdominal: Soft, nondistended, nontender. Bowel sounds active throughout. There are no masses palpable. No  hepatomegaly. Extremities: no edema Lymphadenopathy: No cervical adenopathy noted. Neurological: Alert and oriented to person place and time. Skin: Skin is warm and dry. No rashes noted. Psychiatric: Normal mood and affect. Behavior is normal.   ASSESSMENT AND PLAN:  96. 73 year old female for colon cancer screening. Last colonoscopy in June 2004 was negative. No GI complaints.The risks, benefits, and alternatives to colonoscopy with possible biopsy and possible polypectomy were discussed with the patient and she consents to proceed.  See #2  2. Coronary artery disease, LAD stent December 2013. Patient on Brillinta. Her cardiologist has apparently given written consent for her to come off [place] for the colonoscopy. We will look through Epic to find that note or gt in touch with him.   3. History of laryngospasm during EGD per patient. In 2011 patient experienced GERD symptoms. She was seen at an acute care who subsequently referred her to Dr. Collene Mares (Patient did not realize that she could see Dr. Fuller Plan for this same problem0. At any rate, she underwent upper endoscopy which per patient, was normal. During the procedure sje apparently had some respiratory distress secondary to laryngospasm.

## 2013-05-26 NOTE — Progress Notes (Signed)
Reviewed and agree with management plan.  Debar Plate T. Parveen Freehling, MD FACG 

## 2013-05-27 ENCOUNTER — Telehealth: Payer: Self-pay | Admitting: Cardiovascular Disease

## 2013-05-27 NOTE — Telephone Encounter (Signed)
Having  A colonoscopy should she stop her aspirin also?

## 2013-05-27 NOTE — Telephone Encounter (Signed)
Returned call and pt verified x 2.  Pt informed message received and advised as stated below.  Pt verbalized understanding and agreed w/ plan.  Pt will GI MD to determine when to hold and restart ASA.  Pt also reminded she is to restart Plavix in place of Brilinta when cleared.  Pt verbalized understanding and agreed w/ plan.

## 2013-05-27 NOTE — Telephone Encounter (Signed)
That is normally up to the GI MD.  Per Dr Kennon Holter note, she can stop her ASA if needed.

## 2013-05-27 NOTE — Telephone Encounter (Signed)
OK for pt to hold ASA/Plavix for endoscopy

## 2013-05-27 NOTE — Telephone Encounter (Signed)
Message forwarded to K. Vogel, RN to discuss w/ Dr. Berry.   

## 2013-05-30 ENCOUNTER — Encounter: Payer: Self-pay | Admitting: Cardiovascular Disease

## 2013-05-31 ENCOUNTER — Telehealth: Payer: Self-pay | Admitting: Gastroenterology

## 2013-05-31 DIAGNOSIS — M5030 Other cervical disc degeneration, unspecified cervical region: Secondary | ICD-10-CM | POA: Diagnosis not present

## 2013-05-31 DIAGNOSIS — M47812 Spondylosis without myelopathy or radiculopathy, cervical region: Secondary | ICD-10-CM | POA: Diagnosis not present

## 2013-06-01 NOTE — Telephone Encounter (Signed)
Patient advised ok to have ESI after colon on 06/30/13

## 2013-06-08 NOTE — Telephone Encounter (Signed)
The patient is clear on her instructions regarding the Brilinta.  She is to hold the [place] on 06-22-2013 per Dr. Gwenlyn Found.  She discussed this date with him.  After her colonoscopy on 06-29-2013 she is having another prcedure, injections for her neck. She is to be off the Brilinta for that as well.  She has discussed this with Dr. Gwenlyn Found and the MD doing her neck injections.

## 2013-06-12 ENCOUNTER — Other Ambulatory Visit: Payer: Self-pay | Admitting: Pharmacist Clinician (PhC)/ Clinical Pharmacy Specialist

## 2013-06-27 ENCOUNTER — Other Ambulatory Visit: Payer: Self-pay | Admitting: Pharmacist Clinician (PhC)/ Clinical Pharmacy Specialist

## 2013-06-29 ENCOUNTER — Ambulatory Visit (AMBULATORY_SURGERY_CENTER): Payer: Medicare Other | Admitting: Gastroenterology

## 2013-06-29 ENCOUNTER — Encounter: Payer: Self-pay | Admitting: Gastroenterology

## 2013-06-29 VITALS — BP 149/60 | HR 58 | Temp 99.1°F | Resp 19 | Ht 61.0 in | Wt 147.0 lb

## 2013-06-29 DIAGNOSIS — Z1211 Encounter for screening for malignant neoplasm of colon: Secondary | ICD-10-CM | POA: Diagnosis not present

## 2013-06-29 DIAGNOSIS — I2510 Atherosclerotic heart disease of native coronary artery without angina pectoris: Secondary | ICD-10-CM | POA: Diagnosis not present

## 2013-06-29 DIAGNOSIS — D126 Benign neoplasm of colon, unspecified: Secondary | ICD-10-CM | POA: Diagnosis not present

## 2013-06-29 DIAGNOSIS — I10 Essential (primary) hypertension: Secondary | ICD-10-CM | POA: Diagnosis not present

## 2013-06-29 MED ORDER — SODIUM CHLORIDE 0.9 % IV SOLN: 500.0000 mL | INTRAVENOUS | Status: DC

## 2013-06-29 NOTE — Progress Notes (Signed)
Called to room to assist during endoscopic procedure.  Patient ID and intended procedure confirmed with present staff. Received instructions for my participation in the procedure from the performing physician.  

## 2013-06-29 NOTE — Progress Notes (Signed)
  [facility] Post-op Note  Patient: Brittany Figueroa  Procedure(s) Performed: colonoscopy  Patient Location: [facility] - Recovery Area  Anesthesia Type: Deep Sedation/Propofol  Level of Consciousness: awake, oriented and patient cooperative  Airway and Oxygen Therapy: Patient Spontanous Breathing  Post-op Pain: none  Post-op Assessment:  Post-op Vital signs reviewed, Patient's Cardiovascular Status Stable, Respiratory Function Stable, Patent Airway, No signs of Nausea or vomiting and Pain level controlled  Post-op Vital Signs: Reviewed and stable  Complications: No apparent anesthesia complications  Arpan Eskelson E 3:59 PM

## 2013-06-29 NOTE — Patient Instructions (Signed)

## 2013-06-29 NOTE — Op Note (Signed)
[facility]  [address]. [place], [address]   COLONOSCOPY PROCEDURE REPORT  PATIENT: Brittany Figueroa, Brittany Figueroa  MR#: [id number] BIRTHDATE: [birth year] , 24  yrs. old GENDER: Female ENDOSCOPIST: Ladene Artist, MD, [facility] REFERRED QM:VHQIO Noah Delaine, M.D. PROCEDURE DATE:  06/29/2013 PROCEDURE:   Colonoscopy with snare polypectomy First Screening Colonoscopy - Avg.  risk and is 50 yrs.  old or older - No.  Prior Negative Screening - Now for repeat screening. 10 or more years since last screening  History of Adenoma - Now for follow-up colonoscopy & has been > or = to 3 yrs.  N/A  Polyps Removed Today? Yes. ASA CLASS:   Class II INDICATIONS:average risk screening. MEDICATIONS: MAC sedation, administered by CRNA and propofol (Diprivan) 200mg  IV DESCRIPTION OF PROCEDURE:   After the risks benefits and alternatives of the procedure were thoroughly explained, informed consent was obtained.  A digital rectal exam revealed no abnormalities of the rectum.   The [id number]  endoscope was introduced through the anus and advanced to the cecum, which was identified by both the appendix and ileocecal valve. No adverse events experienced.   The quality of the prep was excellent, using MoviPrep  The instrument was then slowly withdrawn as the colon was fully examined.  COLON FINDINGS: A sessile polyp measuring 6 mm in size was found in the transverse colon.  A polypectomy was performed with a cold snare.  The resection was complete and the polyp tissue was completely retrieved.   The colon was otherwise normal.  There was no diverticulosis, inflammation, polyps or cancers unless previously stated.  Retroflexed views revealed no abnormalities. The time to cecum=3 minutes 02 seconds.  Withdrawal time=10 minutes 17 seconds.  The scope was withdrawn and the procedure completed. COMPLICATIONS: There were no complications.  ENDOSCOPIC IMPRESSION: 1.   Sessile polyp measuring 6 mm  in the transverse colon; polypectomy performed with a cold snare 2.   The colon was otherwise normal  RECOMMENDATIONS: 1.  Await pathology results 2.  Repeat colonoscopy in 5 years if polyp adenomatous; otherwise 10 years 3.  OK to resume Brillinta or Plavix and ASA tomorrow  eSigned:  Ladene Artist, MD, [facility] 06/29/2013 3:56 PM

## 2013-06-30 ENCOUNTER — Telehealth: Payer: Self-pay | Admitting: *Deleted

## 2013-06-30 DIAGNOSIS — M47812 Spondylosis without myelopathy or radiculopathy, cervical region: Secondary | ICD-10-CM | POA: Diagnosis not present

## 2013-06-30 DIAGNOSIS — M5030 Other cervical disc degeneration, unspecified cervical region: Secondary | ICD-10-CM | POA: Diagnosis not present

## 2013-06-30 NOTE — Telephone Encounter (Signed)
  Follow up Call-  Call back number 06/29/2013  Post procedure Call Back phone  # [phone]  Permission to leave phone message Yes     Patient questions:  Do you have a fever, pain , or abdominal swelling? no Pain Score  0 *  Have you tolerated food without any problems? yes  Have you been able to return to your normal activities? yes  Do you have any questions about your discharge instructions: Diet   no Medications  no Follow up visit  no  Do you have questions or concerns about your Care? no  Actions: * If pain score is 4 or above: No action needed, pain <4.

## 2013-07-04 ENCOUNTER — Encounter: Payer: Self-pay | Admitting: Gastroenterology

## 2013-07-05 ENCOUNTER — Encounter: Payer: Medicare Other | Admitting: Gastroenterology

## 2013-07-06 DIAGNOSIS — M19049 Primary osteoarthritis, unspecified hand: Secondary | ICD-10-CM | POA: Diagnosis not present

## 2013-07-06 DIAGNOSIS — M79609 Pain in unspecified limb: Secondary | ICD-10-CM | POA: Diagnosis not present

## 2013-07-13 ENCOUNTER — Telehealth: Payer: Self-pay | Admitting: Cardiovascular Disease

## 2013-07-13 DIAGNOSIS — Z79899 Other long term (current) drug therapy: Secondary | ICD-10-CM | POA: Diagnosis not present

## 2013-07-13 NOTE — Telephone Encounter (Signed)
Returned call to patient. She was calling to inform that she will be going to have P[facility]12 [facility]. Instructed patient to use caution regarding going to lab tomorrow with weather and that is ok to wait until it is safe to drive. Voiced understanding.

## 2013-07-13 NOTE — Telephone Encounter (Signed)
Message form answering service today-The doctor is sending her to [facility] hospital 2/26 for blood.

## 2013-07-14 LAB — PLATELET INHIBITION P2Y12: Platelet Function  P2Y12: 126 [PRU] — ABNORMAL LOW (ref 194–418)

## 2013-07-18 DIAGNOSIS — M5030 Other cervical disc degeneration, unspecified cervical region: Secondary | ICD-10-CM | POA: Diagnosis not present

## 2013-07-18 DIAGNOSIS — M47812 Spondylosis without myelopathy or radiculopathy, cervical region: Secondary | ICD-10-CM | POA: Diagnosis not present

## 2013-07-18 DIAGNOSIS — M542 Cervicalgia: Secondary | ICD-10-CM | POA: Diagnosis not present

## 2013-07-28 DIAGNOSIS — M4712 Other spondylosis with myelopathy, cervical region: Secondary | ICD-10-CM | POA: Diagnosis not present

## 2013-07-28 DIAGNOSIS — Z6828 Body mass index (BMI) 28.0-28.9, adult: Secondary | ICD-10-CM | POA: Diagnosis not present

## 2013-08-02 ENCOUNTER — Telehealth: Payer: Self-pay | Admitting: *Deleted

## 2013-08-02 NOTE — Telephone Encounter (Signed)
Dr Gwenlyn Found reviewed the record and gave clearance have surgery and hold Brilinta (or Plavix) for 5-7 days prior to surgery. Form filled out and faxed back to [facility]

## 2013-08-03 DIAGNOSIS — I10 Essential (primary) hypertension: Secondary | ICD-10-CM | POA: Diagnosis not present

## 2013-08-03 DIAGNOSIS — R7301 Impaired fasting glucose: Secondary | ICD-10-CM | POA: Diagnosis not present

## 2013-08-03 DIAGNOSIS — E785 Hyperlipidemia, unspecified: Secondary | ICD-10-CM | POA: Diagnosis not present

## 2013-08-08 DIAGNOSIS — K7689 Other specified diseases of liver: Secondary | ICD-10-CM | POA: Diagnosis not present

## 2013-08-08 DIAGNOSIS — R7989 Other specified abnormal findings of blood chemistry: Secondary | ICD-10-CM | POA: Diagnosis not present

## 2013-08-08 DIAGNOSIS — I2510 Atherosclerotic heart disease of native coronary artery without angina pectoris: Secondary | ICD-10-CM | POA: Diagnosis not present

## 2013-08-08 DIAGNOSIS — R7301 Impaired fasting glucose: Secondary | ICD-10-CM | POA: Diagnosis not present

## 2013-08-08 DIAGNOSIS — I10 Essential (primary) hypertension: Secondary | ICD-10-CM | POA: Diagnosis not present

## 2013-08-08 DIAGNOSIS — R945 Abnormal results of liver function studies: Secondary | ICD-10-CM | POA: Diagnosis not present

## 2013-08-08 DIAGNOSIS — E785 Hyperlipidemia, unspecified: Secondary | ICD-10-CM | POA: Diagnosis not present

## 2013-08-10 ENCOUNTER — Other Ambulatory Visit: Payer: Self-pay | Admitting: Internal Medicine

## 2013-08-10 DIAGNOSIS — I10 Essential (primary) hypertension: Secondary | ICD-10-CM | POA: Diagnosis not present

## 2013-08-10 DIAGNOSIS — E785 Hyperlipidemia, unspecified: Secondary | ICD-10-CM | POA: Diagnosis not present

## 2013-08-10 DIAGNOSIS — N182 Chronic kidney disease, stage 2 (mild): Secondary | ICD-10-CM | POA: Diagnosis not present

## 2013-08-10 DIAGNOSIS — R1011 Right upper quadrant pain: Secondary | ICD-10-CM

## 2013-08-10 DIAGNOSIS — R740 Nonspecific elevation of levels of transaminase and lactic acid dehydrogenase [LDH]: Principal | ICD-10-CM

## 2013-08-10 DIAGNOSIS — E1129 Type 2 diabetes mellitus with other diabetic kidney complication: Secondary | ICD-10-CM | POA: Diagnosis not present

## 2013-08-10 DIAGNOSIS — R7402 Elevation of levels of lactic acid dehydrogenase (LDH): Secondary | ICD-10-CM

## 2013-08-11 ENCOUNTER — Telehealth: Payer: Self-pay | Admitting: Cardiovascular Disease

## 2013-08-11 NOTE — Telephone Encounter (Signed)
Returned call and pt verified x 2.  Pt stated she just faxed her lab results to [facility] and Dr. Gwenlyn Found.  Stated her iron and liver are abnormal and she is concerned.  Stated Dr. Terance Hart is sending her for an ultrasound (abdominal) on the 7 th (April).  Pt stated she has been looking on the internet and it says -statin drugs can affect the liver.  Pt also stated Dr. Gwenlyn Found started her on Plavix and the pharmacist started her on a new BP pill.  Pt wants Dr. Benay Pike to look at the results and let her know if she needs to stop the cholesterol medicine.   Pt informed RN will check with Medical Records to make sure the results were received.  Informed they will be given to Dr. Gwenlyn Found to review and advise.  Pt verbalized understanding and agreed w/ plan.  Results in Medical Records.  Derek Jack will scan results into Dr. Kennon Holter In Basket.  Curt Bears, RN notified.  Message forwarded to Curt Bears, RN to discuss w/ Dr. Gwenlyn Found.

## 2013-08-11 NOTE — Telephone Encounter (Signed)
She had some elevated lab work concerning her liver,iron and her A1C.She will fax her lab results over today.Please have Curt Bears call her today,she wants to talk to her before she goes out of town.

## 2013-08-12 NOTE — Telephone Encounter (Signed)
Spoke to patient. Informed that labs has not been reviewed. Curt Bears  RN will contact patient  Patient is concerned.

## 2013-08-12 NOTE — Telephone Encounter (Signed)
Pt said she talked to [employer] yesterday and was waiting to hear from what Dr Gwenlyn Found said about her lab work.She said she wanted to hear something before the weekend please.

## 2013-08-15 ENCOUNTER — Telehealth: Payer: Self-pay | Admitting: Cardiovascular Disease

## 2013-08-15 NOTE — Telephone Encounter (Signed)
Need to know the strength of Crestor she is on? She is out of town and she has no Crestor.

## 2013-08-15 NOTE — Telephone Encounter (Signed)
Please call-she wants to talk to you about what she think is causing her liver results to be elevated.

## 2013-08-15 NOTE — Telephone Encounter (Signed)
returned a call to patient and she discussed her abnormal LFT's. Never mentioned or questioned her Crestor.

## 2013-08-15 NOTE — Telephone Encounter (Signed)
Returned a call to patient. She has some concerns that the Irbesartan and clopidogrel is causing her LFT'S to be elevated. She would like for Dr. Gwenlyn Found to review labs scanned into her chart that was drawn by [facility] and call her with his recommendations. She feels that her labs became abnormal once her medications were changed.

## 2013-08-18 ENCOUNTER — Telehealth: Payer: Self-pay | Admitting: Cardiovascular Disease

## 2013-08-18 NOTE — Telephone Encounter (Signed)
Still has not heard anything about her lab results done at [facility] regarding meds affecting her blood work  Please call  Has been trying to find out for over a week.  Please call.

## 2013-08-18 NOTE — Telephone Encounter (Signed)
Spoke w/ Curt Bears, RN.  Stated she is working on Dr. Gwenlyn Found reviewing labs.    Returned call and pt verified x 2.  Pt informed message received and Curt Bears is awaiting a response from Dr. Gwenlyn Found.  Informed she will be notified as soon as he has reviewed labs and given advice.  Pt verbalized understanding and agreed w/ plan.  Stated pharmacist seems to think it is the Plavix and irbesartan that are messing w/ her liver, but she will wait to see what Dr. Gwenlyn Found has to say.

## 2013-08-22 ENCOUNTER — Telehealth: Payer: Self-pay | Admitting: *Deleted

## 2013-08-22 DIAGNOSIS — E782 Mixed hyperlipidemia: Secondary | ICD-10-CM

## 2013-08-22 DIAGNOSIS — Z79899 Other long term (current) drug therapy: Secondary | ICD-10-CM

## 2013-08-22 NOTE — Telephone Encounter (Signed)
Spoke with patient. Patient will stop Crestor and have blood work rechecked.

## 2013-08-22 NOTE — Telephone Encounter (Signed)
Message forwarded to Kathryn, RN.  

## 2013-08-22 NOTE — Telephone Encounter (Signed)
I spoke with patient and verbalized Dr Brittany Figueroa advice to hold Crestor.  Patient verbalized understanding and will hold crestor.  Lab slip mailed.

## 2013-08-22 NOTE — Telephone Encounter (Signed)
Pt was calling in regards to her blood work result. She stated that she has been calling for 12 days and no call back. She stated that her urine smells when she gets up in the morning. She would like a call back about this. She doesn't know why no one has called her back. She stated that we have had her results since the 23rd of March or somewhere around there.  JB

## 2013-08-22 NOTE — Telephone Encounter (Signed)
Lm to call.   Dr Gwenlyn Found did review labs on Friday.  His recommendation is to hold the Crestor and recheck blood work in 4-6 weeks.  He does not want her to hold the plavix.  He does not feel that Plavix has any effect on the lab values that she has.

## 2013-08-22 NOTE — Telephone Encounter (Signed)
lmom for patient

## 2013-08-22 NOTE — Telephone Encounter (Signed)
Message copied by Chauncy Lean on Mon Aug 22, 2013 11:42 AM ------      Message from: [name]      Created: Sat Aug 20, 2013 10:14 AM      Regarding: RE: labs       We can do that and re check 4-6 weeks.            JJB      ----- Message -----         From: Chauncy Lean, RN         Sent: 08/18/2013   9:59 PM           To: [name], MD      Subject: RE: labs                                                 Do you want her to hold her statin?        ----- Message -----         From: [name], MD         Sent: 08/18/2013   4:49 PM           To: Chauncy Lean, RN      Subject: RE: labs                                                 ALT is up, Alk Phos and Bili nl. Statins could be causing but not plavix. Will let McKenzie address                  ----- Message -----         From: Chauncy Lean, RN         Sent: 08/18/2013   9:12 AM           To: [name], MD      Subject: labs                                                     Can you please take a look at her labs from her primary MD (they are scanned in)?  She is stressed about her liver.  Dr Noah Delaine did order an ultrasound. Ms Ponder is questioning the statin and Plavix.            Thanks!                   ------

## 2013-08-22 NOTE — Telephone Encounter (Signed)
lmom 

## 2013-08-22 NOTE — Telephone Encounter (Signed)
Message copied by VOGEL, KATHRYN W. on Mon Aug 22, 2013 11:41 AM ------      Message from: BERRY, JONATHAN J      Created: Thu Aug 18, 2013  4:49 PM      Regarding: RE: labs       ALT is up, Alk Phos and Bili nl. Statins could be causing but not plavix. Will let McKenzie address                  ----- Message -----         From: Kathryn W Vogel, RN         Sent: 08/18/2013   9:12 AM           To: Jonathan J Berry, MD      Subject: labs                                                     Can you please take a look at her labs from her primary MD (they are scanned in)?  She is stressed about her liver.  Dr Mackenzie did order an ultrasound. Ms Tracz is questioning the statin and Plavix.            Thanks!       ------ 

## 2013-08-23 ENCOUNTER — Ambulatory Visit
Admission: RE | Admit: 2013-08-23 | Discharge: 2013-08-23 | Disposition: A | Discharge status: Home / Self Care | Payer: Medicare Other | Source: Ambulatory Visit | Attending: Internal Medicine | Admitting: Internal Medicine

## 2013-08-23 DIAGNOSIS — R740 Nonspecific elevation of levels of transaminase and lactic acid dehydrogenase [LDH]: Principal | ICD-10-CM

## 2013-08-23 DIAGNOSIS — R7989 Other specified abnormal findings of blood chemistry: Secondary | ICD-10-CM | POA: Diagnosis not present

## 2013-08-23 DIAGNOSIS — R1011 Right upper quadrant pain: Secondary | ICD-10-CM

## 2013-08-23 DIAGNOSIS — R7401 Elevation of levels of liver transaminase levels: Secondary | ICD-10-CM

## 2013-08-23 DIAGNOSIS — R7402 Elevation of levels of lactic acid dehydrogenase (LDH): Secondary | ICD-10-CM

## 2013-08-29 DIAGNOSIS — H35369 Drusen (degenerative) of macula, unspecified eye: Secondary | ICD-10-CM | POA: Diagnosis not present

## 2013-09-08 DIAGNOSIS — E1129 Type 2 diabetes mellitus with other diabetic kidney complication: Secondary | ICD-10-CM | POA: Diagnosis not present

## 2013-09-08 DIAGNOSIS — R7989 Other specified abnormal findings of blood chemistry: Secondary | ICD-10-CM | POA: Diagnosis not present

## 2013-09-08 DIAGNOSIS — I10 Essential (primary) hypertension: Secondary | ICD-10-CM | POA: Diagnosis not present

## 2013-09-08 DIAGNOSIS — E785 Hyperlipidemia, unspecified: Secondary | ICD-10-CM | POA: Diagnosis not present

## 2013-09-12 ENCOUNTER — Encounter: Payer: Self-pay | Admitting: Gastroenterology

## 2013-09-12 ENCOUNTER — Ambulatory Visit (INDEPENDENT_AMBULATORY_CARE_PROVIDER_SITE_OTHER): Payer: Medicare Other | Admitting: Gastroenterology

## 2013-09-12 ENCOUNTER — Ambulatory Visit (INDEPENDENT_AMBULATORY_CARE_PROVIDER_SITE_OTHER): Payer: Medicare Other

## 2013-09-12 ENCOUNTER — Ambulatory Visit: Payer: Medicare Other | Admitting: Gastroenterology

## 2013-09-12 VITALS — BP 126/70 | HR 64 | Ht 61.5 in | Wt 142.6 lb

## 2013-09-12 DIAGNOSIS — R740 Nonspecific elevation of levels of transaminase and lactic acid dehydrogenase [LDH]: Principal | ICD-10-CM

## 2013-09-12 DIAGNOSIS — I2510 Atherosclerotic heart disease of native coronary artery without angina pectoris: Secondary | ICD-10-CM | POA: Diagnosis not present

## 2013-09-12 DIAGNOSIS — R7401 Elevation of levels of liver transaminase levels: Secondary | ICD-10-CM

## 2013-09-12 DIAGNOSIS — R7402 Elevation of levels of lactic acid dehydrogenase (LDH): Secondary | ICD-10-CM

## 2013-09-12 LAB — IBC PANEL
Iron: 94 ug/dL (ref 42–145)
Saturation Ratios: 33.8 % (ref 20.0–50.0)
Transferrin: 198.5 mg/dL — ABNORMAL LOW (ref 212.0–360.0)

## 2013-09-12 LAB — PROTIME-INR
INR: 1.1 ratio — ABNORMAL HIGH (ref 0.8–1.0)
Prothrombin Time: 11.7 s (ref 9.6–13.1)

## 2013-09-12 LAB — FERRITIN: Ferritin: 260.5 ng/mL (ref 10.0–291.0)

## 2013-09-12 NOTE — Patient Instructions (Signed)
Your physician has requested that you go to the basement for lab work before leaving today.  Thank you for choosing me and [facility].  Malcolm T. Stark, Jr., MD., FACG  

## 2013-09-12 NOTE — Progress Notes (Signed)
    History of Present Illness: This is a 73 year old female with mildly elevated transaminases noted on routine blood work in March: AST=41, ALT=196. Hepatitis a A, B, C markers were negative. Ferritin mildly elevated at 418. Will resume mild fatty infiltration. Crestor was stopped when mildly elevated LFTs were noted. Patient relates she was started on Avapro in January and Plavix in February. She has no history of liver disease or abnormal liver enzymes. She has a history of hyperlipidemia. Denies weight loss, abdominal pain, constipation, diarrhea, change in stool caliber, melena, hematochezia, nausea, vomiting, dysphagia, reflux symptoms, chest pain.  Current Medications, Allergies, Past Medical History, Past Surgical History, Family History and Social History were reviewed in [employer] record.  Physical Exam: General: Well developed , well nourished, no acute distress Head: Normocephalic and atraumatic Eyes:  sclerae anicteric, EOMI Ears: Normal auditory acuity Mouth: No deformity or lesions Lungs: Clear throughout to auscultation Heart: Regular rate and rhythm; no murmurs, rubs or bruits Abdomen: Soft, non tender and non distended. No masses, hepatosplenomegaly or hernias noted. Normal Bowel sounds Musculoskeletal: Symmetrical with no gross deformities  Pulses:  Normal pulses noted Extremities: No clubbing, cyanosis, edema or deformities noted Neurological: Alert oriented x 4, grossly nonfocal Psychological:  Alert and cooperative. Normal mood and affect  Assessment and Recommendations:  1. Elevated AST/ALT. Fatty liver vs medication side effects. Await repeat hepatic panel off Crestor. Consider Plavix and Avapro as other potential causes. Fatty liver disease is another likely cause. Will obtain markers to rule out genetic and acquired liver diseases.

## 2013-09-13 LAB — ANTI-NUCLEAR AB-TITER (ANA TITER): ANA Titer 1: 1:40 {titer} — ABNORMAL HIGH

## 2013-09-13 LAB — ANA: Anti Nuclear Antibody(ANA): POSITIVE — AB

## 2013-09-13 LAB — ANGIOTENSIN CONVERTING ENZYME: Angiotensin-Converting Enzyme: 29 U/L (ref 8–52)

## 2013-09-14 DIAGNOSIS — Z79899 Other long term (current) drug therapy: Secondary | ICD-10-CM | POA: Diagnosis not present

## 2013-09-14 DIAGNOSIS — E782 Mixed hyperlipidemia: Secondary | ICD-10-CM | POA: Diagnosis not present

## 2013-09-14 LAB — ANTI-SMOOTH MUSCLE ANTIBODY, IGG: Smooth Muscle Ab: 20 U — ABNORMAL HIGH (ref <20)

## 2013-09-14 LAB — CERULOPLASMIN: Ceruloplasmin: 28 mg/dL (ref 18–53)

## 2013-09-14 LAB — MITOCHONDRIAL ANTIBODIES: Mitochondrial M2 Ab, IgG: 0.4 (ref <0.91)

## 2013-09-14 LAB — ALPHA-1-ANTITRYPSIN: A-1 Antitrypsin, Ser: 119 mg/dL (ref 83–199)

## 2013-09-15 ENCOUNTER — Telehealth: Payer: Self-pay | Admitting: Gastroenterology

## 2013-09-15 LAB — LIPID PANEL
Chol/HDL Ratio: 6 ratio units — ABNORMAL HIGH (ref 0.0–4.4)
Cholesterol, Total: 275 mg/dL — ABNORMAL HIGH (ref 100–199)
HDL: 46 mg/dL (ref >39)
LDL Calculated: 192 mg/dL — ABNORMAL HIGH (ref 0–99)
Triglycerides: 183 mg/dL — ABNORMAL HIGH (ref 0–149)
VLDL Cholesterol Cal: 37 mg/dL (ref 5–40)

## 2013-09-15 LAB — HEPATIC FUNCTION PANEL
ALT: 42 IU/L — ABNORMAL HIGH (ref 0–32)
AST: 27 IU/L (ref 0–40)
Albumin: 4.3 g/dL (ref 3.5–4.8)
Alkaline Phosphatase: 72 IU/L (ref 39–117)
Bilirubin, Direct: 0.07 mg/dL (ref 0.00–0.40)
Total Bilirubin: 0.4 mg/dL (ref 0.0–1.2)
Total Protein: 6.9 g/dL (ref 6.0–8.5)

## 2013-09-15 NOTE — Telephone Encounter (Signed)
Labs mailed

## 2013-09-20 ENCOUNTER — Ambulatory Visit (INDEPENDENT_AMBULATORY_CARE_PROVIDER_SITE_OTHER): Payer: Medicare Other | Admitting: Cardiovascular Disease

## 2013-09-20 ENCOUNTER — Encounter: Payer: Self-pay | Admitting: Cardiovascular Disease

## 2013-09-20 VITALS — BP 122/74 | HR 60 | Ht 61.75 in | Wt 141.5 lb

## 2013-09-20 DIAGNOSIS — Z01818 Encounter for other preprocedural examination: Secondary | ICD-10-CM | POA: Diagnosis not present

## 2013-09-20 DIAGNOSIS — Z95820 Peripheral vascular angioplasty status with implants and grafts: Secondary | ICD-10-CM

## 2013-09-20 DIAGNOSIS — I10 Essential (primary) hypertension: Secondary | ICD-10-CM

## 2013-09-20 DIAGNOSIS — E785 Hyperlipidemia, unspecified: Secondary | ICD-10-CM

## 2013-09-20 DIAGNOSIS — Z9889 Other specified postprocedural states: Secondary | ICD-10-CM | POA: Diagnosis not present

## 2013-09-20 DIAGNOSIS — I2510 Atherosclerotic heart disease of native coronary artery without angina pectoris: Secondary | ICD-10-CM | POA: Diagnosis not present

## 2013-09-20 NOTE — Assessment & Plan Note (Signed)
Controlled on current medications 

## 2013-09-20 NOTE — Assessment & Plan Note (Signed)
Controlled on statin therapy however she did have elevated LFTs which improved with a "statin holiday. Conversely, her lipid profile got significantly worse off of a statin drug. Based on this, I am referring her to [facility] at our lipid clinic for further dilation and treatment.

## 2013-09-20 NOTE — Progress Notes (Signed)
09/20/2013 Brittany Figueroa   07/24/1940  [id number]  Primary Physician Thressa Sheller, MD Primary Cardiologist: [name] MD [name]   HPI:  The patient is a 73 year old mildly overweight divorced Caucasian female, mother to 35, grandmother to 3 grandchildren, who I last saw 3 months ago. She has a history of hypertension and hyperlipidemia. I saw her in December of 2013 with new anterolateral T-wave inversion and a Myoview that showed inferolateral ischemia that was new compared to a prior study. She did have some unusual symptoms at that time. Based on this, I catheterized her on May 13, 2012, revealing an 80% proximal LAD lesion, which I stented using a drug-eluting stent, as well as a total dominant RCA with left to right collaterals and normal LV function. Since stenting her LAD, she is ultimately asymptomatic. Her other problems include hypertension and hyperlipidemia. I have reviewed her blood pressures, which were recorded during cardiac rehab, which were all normal. Her recent blood work revealed a total cholesterol of 161, LDL of 89 and HDL of 40.she denies chest pain or shortness of breath. She had a recent Myoview stress test performed several months ago that showed ischemia in the RCA territory but otherwise unremarkable is explainable by her known total dominant RCA with left to right collaterals. Chief complaint of excruciating neck pain thought to be related to cervical disc disease. She was evaluated by Dr. Because of her neck pain demonstrated cervical disc disease requiring fusion. Because of this she will need to undergo mammographic Myoview stress testing to risk stratify her. In addition, I believe it would be safe for her to come off her Plavix Cardura procedure and we started an inappropriate to time afterwards at the discretion of her neurosurgeon. She did have elevated liver function tests and was evaluated Dr. Fuller Plan. I placed her on a statin holiday  which resulted in improvement in her LFTs and worsening of her lipid profile.   Current Outpatient Prescriptions  Medication Sig Dispense Refill  . aspirin 81 MG tablet Take 81 mg by mouth daily.      . calcium-vitamin D (OSCAL WITH D) 500-200 MG-UNIT per tablet Take 2 tablets by mouth daily.      . carvedilol (COREG) 6.25 MG tablet Take 6.25 mg by mouth 2 (two) times daily with a meal.       . Cholecalciferol (VITAMIN D3) 2000 UNITS TABS Take 2,000 Units by mouth 2 (two) times daily.      . clopidogrel (PLAVIX) 75 MG tablet Take 1 tablet (75 mg total) by mouth daily.  90 tablet  3  . diclofenac sodium (VOLTAREN) 1 % GEL Apply 2 g topically daily as needed. For pain      . ezetimibe (ZETIA) 10 MG tablet Take 1 tablet (10 mg total) by mouth daily.  14 tablet  0  . fish oil-omega-3 fatty acids 1000 MG capsule Take 1.2 g by mouth daily.       . irbesartan (AVAPRO) 150 MG tablet TAKE 1 TABLET BY MOUTH DAILY  30 tablet  10  . Multiple Vitamin (MULTIVITAMIN WITH MINERALS) TABS Take 1 tablet by mouth daily.      Marland Kitchen NITROSTAT 0.4 MG SL tablet PLACE 1 TABLET UNDER THE TONGUE EVERY 5 MINUTES AS NEEDED FOR CHEST PAIN  25 tablet  3   No current facility-administered medications for this visit.    Allergies  Allergen Reactions  . Statins Other (See Comments)    "intermittent loss of  circulation; hands and arms will go to sleep; feet will get cramps; fatigue" (05/13/2012)    History   Social History  . Marital Status: Divorced    Spouse Name: N/A    Number of Children: N/A  . Years of Education: N/A   Occupational History  . Not on file.   Social History Main Topics  . Smoking status: Former Smoker -- 0.50 packs/day for 20 years    Types: Cigarettes    Quit date: 07/05/1988  . Smokeless tobacco: Never Used  . Alcohol Use: 7.2 oz/week    12 Glasses of wine per week     Comment: 05/13/2012 "6-8 oz glass of red wine q hs"   . Drug Use: No  . Sexual Activity: No   Other Topics Concern    . Not on file   Social History Narrative  . No narrative on file     Review of Systems: General: negative for chills, fever, night sweats or weight changes.  Cardiovascular: negative for chest pain, dyspnea on exertion, edema, orthopnea, palpitations, paroxysmal nocturnal dyspnea or shortness of breath Dermatological: negative for rash Respiratory: negative for cough or wheezing Urologic: negative for hematuria Abdominal: negative for nausea, vomiting, diarrhea, bright red blood per rectum, melena, or hematemesis Neurologic: negative for visual changes, syncope, or dizziness All other systems reviewed and are otherwise negative except as noted above.    Blood pressure 122/74, pulse 60, height 5' 1.75" (1.568 m), weight 141 lb 8 oz (64.184 kg).  General appearance: alert and no distress Neck: no adenopathy, no carotid bruit, no JVD, supple, symmetrical, trachea midline and thyroid not enlarged, symmetric, no tenderness/mass/nodules Lungs: clear to auscultation bilaterally Heart: regular rate and rhythm, S1, S2 normal, no murmur, click, rub or gallop Extremities: extremities normal, atraumatic, no cyanosis or edema  EKG normal sinus rhythm at 60 with no ST or T wave changes  ASSESSMENT AND PLAN:   S/P angioplasty with stent, LAD 05/13/12 History of CAD status post proximal LAD stenting by myself 05/13/12 with a known occluded dominant RCA and left to right collaterals. She had normal LV function. She scheduled to have a fairly extensive cervical disc fusion procedure by Dr. Donnamae Jude. I'm going to get a pharmacologic Myoview stress test to risk stratify her  Hyperlipidemia Controlled on statin therapy however she did have elevated LFTs which improved with a "statin holiday. Conversely, her lipid profile got significantly worse off of a statin drug. Based on this, I am referring her to [facility] at our lipid clinic for further dilation and treatment.  HTN (hypertension) Controlled  on current medications      [name] MD [facility], [facility] 09/20/2013 10:42 AM

## 2013-09-20 NOTE — Patient Instructions (Signed)
  We will see you back in follow up in 6 months with an extender and 1 year with Dr Gwenlyn Found.   Dr Gwenlyn Found has ordered ; 1. Lexiscan Myoview- this is a test that looks at the blood flow to your heart muscle.  It takes approximately 2 1/2 hours. Please follow instruction sheet, as given.   2. Dr Gwenlyn Found has referred you to [facility] for a cholesterol evaluation.

## 2013-09-20 NOTE — Assessment & Plan Note (Signed)
History of CAD status post proximal LAD stenting by myself 05/13/12 with a known occluded dominant RCA and left to right collaterals. She had normal LV function. She scheduled to have a fairly extensive cervical disc fusion procedure by Dr. Donnamae Jude. I'm going to get a pharmacologic Myoview stress test to risk stratify her

## 2013-09-27 ENCOUNTER — Ambulatory Visit (INDEPENDENT_AMBULATORY_CARE_PROVIDER_SITE_OTHER): Payer: Medicare Other | Admitting: Pharmacist

## 2013-09-27 ENCOUNTER — Telehealth (HOSPITAL_COMMUNITY): Payer: Self-pay

## 2013-09-27 VITALS — Wt 138.0 lb

## 2013-09-27 DIAGNOSIS — E785 Hyperlipidemia, unspecified: Secondary | ICD-10-CM

## 2013-09-27 DIAGNOSIS — Z79899 Other long term (current) drug therapy: Secondary | ICD-10-CM

## 2013-09-27 DIAGNOSIS — I2510 Atherosclerotic heart disease of native coronary artery without angina pectoris: Secondary | ICD-10-CM

## 2013-09-27 MED ORDER — PITAVASTATIN CALCIUM 2 MG PO TABS: 1.0000 mg | ORAL_TABLET | Freq: Every day | ORAL | Status: DC

## 2013-09-27 NOTE — Assessment & Plan Note (Addendum)
Given patient's history of failing lipitor, zocor, and Crestor daily, will have patient start a very low dose of Livalo 1 mg daily.  LFTs are normal at this time, and no major hepatic issues per Dr. Silvio Pate.  Will continue Zetia 10 mg qd given she may not be able to take high dose statin, and in small studies has also shown to help improve fatty liver.  Gave patient some samples to make sure she tolerates and is effective, and if cholesterol well controlled in 8-12 weeks will send in a prescription which will prior require a prior auth.  Since she has failed lipitor, Zocor, and Crestor, she should be able to get Livalo covered if needed.  If she can't tolerate Livalo for some reason, would try to enroll her into SPIRE-2.  She will call me if side effects occur.  She has medicare part D currently / has [employer].  Recheck lipid/liver 12/13/13 and see me 2 days later.

## 2013-09-27 NOTE — Progress Notes (Signed)
Patient is a 73 y.o. WF referred to lipid clinic by Dr. Gwenlyn Found given h/o CAD (s/p PCI of LAD in 2013) and h/o statin intolerance.  Patient has failed multiple statins in the past due to muscle soreness and arm/hands going to sleep.  Most recently stopped Crestor due to LFT elevation which resolved following patient stopping Crestor.  Patient failed Crestor years ago initially due to muscle soreness, but was restarted on this 04/2012 following PCI, and tolerated it pretty well for the past year.  LFTs were normal while on Crestor until recently when ALT went up 196 U/L.  Patient had an abdominal U/S which showed it could possibly be fatty liver, and also saw Dr Silvio Pate.  Hepatis markers were negative.   After stopping Crestor for a few weeks, LFTs returned to normal.  Patient has read about Livalo and Welchol and wanted to talk about these options today as it appears these are the only other lipid lowering medications she hasn't tried before.  She tried a holistic approach with CholestOff/fiber/fish oil, but this didn't appear to improve cholesterol per patient.  She has multiple brothers and sisters who have elevated cholesterol, heart disease, and diabetes.  She tells me that when she was around 73 y.o., her LDL went up significantly, which was around the time of menopause.  Has tried multiple lipid lowering medications since then.  Patient already eating a low fat diet.  RF:  CAD, insulin resistance, HTN, age - LDL goal < 70, non-HDL goal < 100 Meds:  Zetia 10 mg qd Intolerant:  Lipitor, Zocor, Crestor all caused muscle aches.  Crestor 5 mg qd appears to have caused elevated LFTs (ALT 196 U/L) - they resolved after stopping Crestor for a few weeks.  Labs: 09/2013:  TC 275, LDL 192, HDL 46, TG 183, ALT 42, AST 27 (Zetia 10 mg qd only) 08/2013:  ALT 196, AST 41 (Crestor 5 mg qd, Zetia 10 mg qd) -- LDL historically ~ 90 mg/dL on Crestor 5 mg qd + Zetia 10 mg qd  Current Outpatient Prescriptions  Medication  Sig Dispense Refill  . aspirin 81 MG tablet Take 81 mg by mouth daily.      . calcium-vitamin D (OSCAL WITH D) 500-200 MG-UNIT per tablet Take 2 tablets by mouth daily.      . carvedilol (COREG) 6.25 MG tablet Take 6.25 mg by mouth 2 (two) times daily with a meal.       . Cholecalciferol (VITAMIN D3) 2000 UNITS TABS Take 2,000 Units by mouth 2 (two) times daily.      . clopidogrel (PLAVIX) 75 MG tablet Take 1 tablet (75 mg total) by mouth daily.  90 tablet  3  . diclofenac sodium (VOLTAREN) 1 % GEL Apply 2 g topically daily as needed. For pain      . ezetimibe (ZETIA) 10 MG tablet Take 1 tablet (10 mg total) by mouth daily.  14 tablet  0  . fish oil-omega-3 fatty acids 1000 MG capsule Take 1.2 g by mouth daily.       . irbesartan (AVAPRO) 150 MG tablet TAKE 1 TABLET BY MOUTH DAILY  30 tablet  10  . Multiple Vitamin (MULTIVITAMIN WITH MINERALS) TABS Take 1 tablet by mouth daily.      Marland Kitchen NITROSTAT 0.4 MG SL tablet PLACE 1 TABLET UNDER THE TONGUE EVERY 5 MINUTES AS NEEDED FOR CHEST PAIN  25 tablet  3   No current facility-administered medications for this visit.   Allergies  Allergen Reactions  . Statins Other (See Comments)    "intermittent loss of circulation; hands and arms will go to sleep; feet will get cramps; fatigue" (05/13/2012).  This occurred with Lipitor, Zocor, Crestor 5 mg qd, and she thinks pravastatin as well   Family History  Problem Relation Age of Onset  . Hypertension Mother   . Diabetes Mother   . Hyperlipidemia Father   . Heart disease Father   . Stroke Father   . Heart disease Sister   . Diabetes Sister   . Heart disease Brother

## 2013-09-27 NOTE — Patient Instructions (Signed)
1.  Start Livalo 1 mg daily (take 1/2 of 2 mg tablets).  Take it in the PM. 2.  Continue Zetia 10 mg daily now. 3.  Recheck cholesterol in 2-3 months.  Lab work on 12/13/13 - fasting lab at [facility].  See Ysidro Evert on 12/15/13 at 11:00  Call [place] at [phone] if side effects on Livalo.  Could consider PCSK-9 inhibitor study if this occurs.  Bococizumab [employer]) is the study medication.

## 2013-09-28 ENCOUNTER — Other Ambulatory Visit: Payer: Self-pay | Admitting: Obstetrics and Gynecology

## 2013-09-28 DIAGNOSIS — Z124 Encounter for screening for malignant neoplasm of cervix: Secondary | ICD-10-CM | POA: Diagnosis not present

## 2013-09-28 DIAGNOSIS — N951 Menopausal and female climacteric states: Secondary | ICD-10-CM | POA: Diagnosis not present

## 2013-09-28 DIAGNOSIS — Z1231 Encounter for screening mammogram for malignant neoplasm of breast: Secondary | ICD-10-CM | POA: Diagnosis not present

## 2013-09-29 ENCOUNTER — Ambulatory Visit (HOSPITAL_COMMUNITY)
Admission: RE | Admit: 2013-09-29 | Discharge: 2013-09-29 | Disposition: A | Discharge status: Home / Self Care | Payer: Medicare Other | Source: Ambulatory Visit | Attending: Cardiology | Admitting: Cardiology

## 2013-09-29 DIAGNOSIS — I2510 Atherosclerotic heart disease of native coronary artery without angina pectoris: Secondary | ICD-10-CM | POA: Insufficient documentation

## 2013-09-29 DIAGNOSIS — Z01810 Encounter for preprocedural cardiovascular examination: Secondary | ICD-10-CM | POA: Diagnosis not present

## 2013-09-29 DIAGNOSIS — Z01818 Encounter for other preprocedural examination: Secondary | ICD-10-CM

## 2013-09-29 MED ORDER — REGADENOSON 0.4 MG/5ML IV SOLN
0.4000 mg | Freq: Once | INTRAVENOUS | Status: AC
  Administered 2013-09-29: 0.4 mg via INTRAVENOUS

## 2013-09-29 MED ORDER — TECHNETIUM TC 99M SESTAMIBI GENERIC - CARDIOLITE
30.0000 | Freq: Once | INTRAVENOUS | Status: AC | PRN
  Administered 2013-09-29: 30 via INTRAVENOUS

## 2013-09-29 MED ORDER — AMINOPHYLLINE 25 MG/ML IV SOLN
75.0000 mg | Freq: Once | INTRAVENOUS | Status: AC
  Administered 2013-09-29: 75 mg via INTRAVENOUS

## 2013-09-29 MED ORDER — TECHNETIUM TC 99M SESTAMIBI GENERIC - CARDIOLITE
10.0000 | Freq: Once | INTRAVENOUS | Status: AC | PRN
  Administered 2013-09-29: 10 via INTRAVENOUS

## 2013-09-29 NOTE — Procedures (Addendum)
[facility] [address] [place] [address] [phone]  Cardiology Nuclear Med Study  Brittany Figueroa is a 73 y.o. female     MRN : [id number]     DOB: Jun 09, 1940  Procedure Date: 09/29/2013  Nuclear Med Background Indication for Stress Test:  Surgical Clearance History:  CAD;STENT/PTCA-04/2012;Last NUC MPI on 03/22/2013-ischemia;EF=62% Cardiac Risk Factors: Family History - CAD, History of Smoking, Hypertension, Lipids and Overweight  Symptoms:  Fatigue   Nuclear Pre-Procedure Caffeine/Decaff Intake:  7:00pm NPO After: 5:00am   IV Site: R Forearm  IV 0.9% NS with Angio Cath:  22g  Chest Size (in):  n/a IV Started by: Rolene Course, RN  Height: 5' 1.75" (1.568 m)  Cup Size: C  BMI:  Body mass index is 26.01 kg/(m^2). Weight:  141 lb (63.957 kg)   Tech Comments:  n/a    Nuclear Med Study 1 or 2 day study: 1 day  Stress Test Type:  [place] Provider:  Quay Burow, MD   Resting Radionuclide: Technetium 63m Sestamibi  Resting Radionuclide Dose: 10.2 mCi   Stress Radionuclide:  Technetium 74m Sestamibi  Stress Radionuclide Dose: 30.9 mCi           Stress Protocol Rest HR: 60 Stress HR: 86  Rest BP: 178/93 Stress BP: 177/97  Exercise Time (min): n/a METS: n/a   Predicted Max HR: 150 bpm % Max HR: 58.67 bpm Rate Pressure Product: 15928  Dose of Adenosine (mg):  n/a Dose of Lexiscan: 0.4 mg  Dose of Atropine (mg): n/a Dose of Dobutamine: n/a mcg/kg/min (at max HR)  Stress Test Technologist: Leane Para, CCT Nuclear Technologist: Otho Perl, CNMT   Rest Procedure:  Myocardial perfusion imaging was performed at rest 45 minutes following the intravenous administration of Technetium 31m Sestamibi. Stress Procedure:  The patient received IV Lexiscan 0.4 mg over 15-seconds.  Technetium 22m Sestamibi injected IV at 30-seconds. Patient experienced nausea and mild chest pain and 75 mg of  Aminophylline was administered at 5 minutes. There were no significant changes with Lexiscan.  Quantitative spect images were obtained after a 45 minute delay.  Transient Ischemic Dilatation (Normal <1.22):  1.02 Lung/Heart Ratio (Normal <0.45):  0.31 QGS EDV:  70 ml QGS ESV:  27 ml LV Ejection Fraction: 61%  Robin Moffitt, CNMT  PHYSICIAN INTERPRETATION  Overall the quality of the study was inadequate to allow for appropriate interpretation of the data. Would recommend either redo study versus other imaging modality.  Rest ECG: NSR with non-specific ST-T wave changes  Stress ECG: No significant change from baseline ECG  QPS Raw Data Images:  Patient lotion artifact is noted. There is extensive splanchnic visceral uptake of tracer that obscures the inferoseptal wall. This makes interpretation of the findings virtually impossible. Stress Images:  Stress images demonstrate near obliteration of the anteroseptal wall with splanchnic visceral tracer uptake. Unfortunately, the computer read suggests a large area of basal inferoseptal ischemia. When comparing the stress images there is less slight make overlying. This is likely consistent with artifact, however cannot exclude infarct with peri-infarct ischemia. Rest Images:  the slight make tissue does not become apparent until closer to the base in the inferior septal wall. There does appear to be similar perfusion defect in the inferoseptal wall at the base as noted in stress images. Subtraction (SDS):  Unfortunately, based on the significant splanchnic visceral uptake, unable to determine if there is or is not inferior/inferoseptal ischemia. Most likely  this is artifact, however I cannot exclude partial infarct with peri-infarct ischemia.   Impression Exercise Capacity:  Lexiscan with no exercise. BP Response:  Normal blood pressure response. Clinical Symptoms:  Atypical chest pain. and dyspnea  ECG Impression:  No significant ECG changes  with Lexiscan. Comparison with Prior Nuclear Study: When compared to previous images, there is significant lesion more splanchnic the stroke seen on stress images as well as rest images. The basal inferoseptal defect appears to be similar to prior study.  Overall Impression:  Abnormal nuclear perfusion imaging with essentially uninterpretable data. Cannot exclude or confirm the presence of infarct or ischemia versus artifact based on the images provided. Wall motion cannot be adequately assessed nor could the left ventricular wall motion or volume.  LV Wall Motion:  Could not be assessed due to poor image capture   Leonie Man, MD  09/29/2013 6:52 PM

## 2013-09-30 ENCOUNTER — Telehealth: Payer: Self-pay | Admitting: *Deleted

## 2013-09-30 NOTE — Telephone Encounter (Signed)
Message copied by Chauncy Lean on Fri Sep 30, 2013  2:20 PM ------      Message from: [name]      Created: Fri Sep 30, 2013  6:40 AM       Apparently has un interpretable Myoview. Make sure I review images when I'm in the office please ------

## 2013-10-19 DIAGNOSIS — L821 Other seborrheic keratosis: Secondary | ICD-10-CM | POA: Diagnosis not present

## 2013-10-19 DIAGNOSIS — D485 Neoplasm of uncertain behavior of skin: Secondary | ICD-10-CM | POA: Diagnosis not present

## 2013-10-19 DIAGNOSIS — D235 Other benign neoplasm of skin of trunk: Secondary | ICD-10-CM | POA: Diagnosis not present

## 2013-10-27 ENCOUNTER — Other Ambulatory Visit: Payer: Self-pay | Admitting: Neurosurgery

## 2013-10-27 DIAGNOSIS — Z6827 Body mass index (BMI) 27.0-27.9, adult: Secondary | ICD-10-CM | POA: Diagnosis not present

## 2013-10-27 DIAGNOSIS — M4712 Other spondylosis with myelopathy, cervical region: Secondary | ICD-10-CM | POA: Diagnosis not present

## 2013-11-03 DIAGNOSIS — E785 Hyperlipidemia, unspecified: Secondary | ICD-10-CM | POA: Diagnosis not present

## 2013-11-03 DIAGNOSIS — M949 Disorder of cartilage, unspecified: Secondary | ICD-10-CM | POA: Diagnosis not present

## 2013-11-03 DIAGNOSIS — M899 Disorder of bone, unspecified: Secondary | ICD-10-CM | POA: Diagnosis not present

## 2013-11-03 DIAGNOSIS — E1129 Type 2 diabetes mellitus with other diabetic kidney complication: Secondary | ICD-10-CM | POA: Diagnosis not present

## 2013-11-07 ENCOUNTER — Telehealth: Payer: Self-pay | Admitting: Cardiovascular Disease

## 2013-11-07 NOTE — Telephone Encounter (Signed)
Scheduled for her surgeryon 12-08-13,cervical spine surgery. She wants to know if she should be off her Plavix and  Aspirin for 5 or 7 days.

## 2013-11-07 NOTE — Telephone Encounter (Signed)
Message sent to Dr. Gwenlyn Found and Curt Bears for order.  Left message for patient which surgeon?

## 2013-11-07 NOTE — Telephone Encounter (Signed)
I spoke with patient.  Dr Annette Stable is doing the surgery. She just wanted to confirm that she could hold Plavix 5 days prior. Yes, she can per surgical clearance note.  Patient verbalized understanding.

## 2013-11-07 NOTE — Telephone Encounter (Signed)
Brittany Figueroa was cleared for neck surgery back in May 2015.  The clearance letter was sent to Dr Annette Stable.

## 2013-11-07 NOTE — Telephone Encounter (Signed)
Dr Gwenlyn Found, Please advise

## 2013-11-10 DIAGNOSIS — E785 Hyperlipidemia, unspecified: Secondary | ICD-10-CM | POA: Diagnosis not present

## 2013-11-10 DIAGNOSIS — I10 Essential (primary) hypertension: Secondary | ICD-10-CM | POA: Diagnosis not present

## 2013-11-10 DIAGNOSIS — I2510 Atherosclerotic heart disease of native coronary artery without angina pectoris: Secondary | ICD-10-CM | POA: Diagnosis not present

## 2013-11-10 DIAGNOSIS — E1129 Type 2 diabetes mellitus with other diabetic kidney complication: Secondary | ICD-10-CM | POA: Diagnosis not present

## 2013-11-11 ENCOUNTER — Encounter: Payer: Self-pay | Admitting: Cardiovascular Disease

## 2013-11-17 NOTE — Telephone Encounter (Signed)
Encounter complete. 

## 2013-11-30 ENCOUNTER — Encounter (HOSPITAL_COMMUNITY): Payer: Self-pay | Admitting: Pharmacy Technician

## 2013-11-30 NOTE — Pre-Procedure Instructions (Signed)
Brittany Figueroa  11/30/2013   Your procedure is scheduled on: Tuesday, December 13, 2013  Report to [facility] Admitting at 5:45 AM.  Call this number if you have problems the morning of surgery: [phone]   Remember:   Do not eat food or drink liquids after midnight Monday, December 12, 2013   Take these medicines the morning of surgery with A SIP OF WATER: carvedilol (COREG), if needed:  nitroGLYCERIN (NITROSTAT) for chest pain Stop taking Aspirin, Coumadin, Plavix, Effient, and herbal medications ( Fish oil, Multivitamins).  Do not take any NSAIDs ie: Ibuprofen, Advil, Naproxen or any medication containing Aspirin (diclofenac sodium (VOLTAREN)  Do not wear jewelry, make-up or nail polish.  Do not wear lotions, powders, or perfumes. You may wear deodorant.  Do not shave 48 hours prior to surgery.   Do not bring valuables to the hospital.  [facility] is not responsible for any belongings or valuables.               Contacts, dentures or bridgework may not be worn into surgery.  Leave suitcase in the car. After surgery it may be brought to your room.  For patients admitted to the hospital, discharge time is determined by your treatment team.               Patients discharged the day of surgery will not be allowed to drive home.  Name and phone number of your driver:   Special Instructions:  Special Instructions:Special Instructions: [facility] - Preparing for Surgery  Before surgery, you can play an important role.  Because skin is not sterile, your skin needs to be as free of germs as possible.  You can reduce the number of germs on you skin by washing with CHG (chlorahexidine gluconate) soap before surgery.  CHG is an antiseptic cleaner which kills germs and bonds with the skin to continue killing germs even after washing.  Please DO NOT use if you have an allergy to CHG or antibacterial soaps.  If your skin becomes reddened/irritated stop using the CHG and inform your nurse when  you arrive at Short Stay.  Do not shave (including legs and underarms) for at least 48 hours prior to the first CHG shower.  You may shave your face.  Please follow these instructions carefully:   1.  Shower with CHG Soap the night before surgery and the morning of Surgery.  2.  If you choose to wash your hair, wash your hair first as usual with your normal shampoo.  3.  After you shampoo, rinse your hair and body thoroughly to remove the Shampoo.  4.  Use CHG as you would any other liquid soap.  You can apply chg directly  to the skin and wash gently with scrungie or a clean washcloth.  5.  Apply the CHG Soap to your body ONLY FROM THE NECK DOWN.  Do not use on open wounds or open sores.  Avoid contact with your eyes, ears, mouth and genitals (private parts).  Wash genitals (private parts) with your normal soap.  6.  Wash thoroughly, paying special attention to the area where your surgery will be performed.  7.  Thoroughly rinse your body with warm water from the neck down.  8.  DO NOT shower/wash with your normal soap after using and rinsing off the CHG Soap.  9.  Pat yourself dry with a clean towel.  10.  Wear clean pajamas.            11.  Place clean sheets on your bed the night of your first shower and do not sleep with pets.  Day of Surgery  Do not apply any lotions the morning of surgery.  Please wear clean clothes to the hospital/surgery center.   Please read over the following fact sheets that you were given: Pain Booklet, Coughing and Deep Breathing, MRSA Information and Surgical Site Infection Prevention

## 2013-12-01 ENCOUNTER — Ambulatory Visit (HOSPITAL_COMMUNITY)
Admission: RE | Admit: 2013-12-01 | Discharge: 2013-12-01 | Disposition: A | Discharge status: Home / Self Care | Payer: Medicare Other | Source: Ambulatory Visit | Attending: Neurosurgery | Admitting: Neurosurgery

## 2013-12-01 ENCOUNTER — Encounter (HOSPITAL_COMMUNITY): Payer: Self-pay

## 2013-12-01 ENCOUNTER — Encounter (HOSPITAL_COMMUNITY)
Admission: RE | Admit: 2013-12-01 | Discharge: 2013-12-01 | Disposition: A | Discharge status: Home / Self Care | Payer: Medicare Other | Source: Ambulatory Visit | Attending: Neurosurgery | Admitting: Neurosurgery

## 2013-12-01 DIAGNOSIS — Z01818 Encounter for other preprocedural examination: Secondary | ICD-10-CM | POA: Diagnosis not present

## 2013-12-01 HISTORY — DX: Malignant (primary) neoplasm, unspecified: C80.1

## 2013-12-01 HISTORY — DX: Respiratory tuberculosis unspecified: A15.9

## 2013-12-01 LAB — CBC WITH DIFFERENTIAL/PLATELET
Basophils Absolute: 0 10*3/uL (ref 0.0–0.1)
Basophils Relative: 1 % (ref 0–1)
Eosinophils Absolute: 0.2 10*3/uL (ref 0.0–0.7)
Eosinophils Relative: 4 % (ref 0–5)
HCT: 40.7 % (ref 36.0–46.0)
Hemoglobin: 13.7 g/dL (ref 12.0–15.0)
Lymphocytes Relative: 26 % (ref 12–46)
Lymphs Abs: 1.4 10*3/uL (ref 0.7–4.0)
MCH: 30.9 pg (ref 26.0–34.0)
MCHC: 33.7 g/dL (ref 30.0–36.0)
MCV: 91.9 fL (ref 78.0–100.0)
Monocytes Absolute: 0.3 10*3/uL (ref 0.1–1.0)
Monocytes Relative: 6 % (ref 3–12)
Neutro Abs: 3.3 10*3/uL (ref 1.7–7.7)
Neutrophils Relative %: 63 % (ref 43–77)
Platelets: 304 10*3/uL (ref 150–400)
RBC: 4.43 MIL/uL (ref 3.87–5.11)
RDW: 13.6 % (ref 11.5–15.5)
WBC: 5.3 10*3/uL (ref 4.0–10.5)

## 2013-12-01 LAB — BASIC METABOLIC PANEL
Anion gap: 16 — ABNORMAL HIGH (ref 5–15)
BUN: 14 mg/dL (ref 6–23)
CO2: 25 mEq/L (ref 19–32)
Calcium: 10 mg/dL (ref 8.4–10.5)
Chloride: 96 mEq/L (ref 96–112)
Creatinine, Ser: 0.54 mg/dL (ref 0.50–1.10)
GFR calc Af Amer: >90 mL/min (ref >90)
GFR calc non Af Amer: >90 mL/min (ref >90)
Glucose, Bld: 132 mg/dL — ABNORMAL HIGH (ref 70–99)
Potassium: 4.3 mEq/L (ref 3.7–5.3)
Sodium: 137 mEq/L (ref 137–147)

## 2013-12-01 LAB — SURGICAL PCR SCREEN
MRSA, PCR: NEGATIVE
Staphylococcus aureus: NEGATIVE

## 2013-12-01 NOTE — Progress Notes (Signed)
Pt. Followed by PCP- [facility], [place] Cardiac- Dr. Gwenlyn Found, cleared for surgery. Call to [facility], reported the last stress test result. Also reported that pt. states she had larynospasms during/post endoscopy.  Pt. Aware of need to hold Plavix & Aspirin as of 12/08/2013.

## 2013-12-01 NOTE — Progress Notes (Signed)
Melinda fr. [facility] Says they will fax report fr. Endoscopy to [facility] [phone]

## 2013-12-01 NOTE — Progress Notes (Signed)
Call to Dr. Ulyses Amor office, requesting endoscopy report from 2012 when she reports that she had a episode that she "stopped breathing for 15 secs.- due to [employer]

## 2013-12-01 NOTE — Progress Notes (Signed)
Anesthesia Chart Review:  Patient is a 73 year old female scheduled for 3 level ACDF on 12/13/13 by Dr. Annette Stable.  History includes former smoker, CAD s/p LAD stent 04/2012, hypercholesterolemia, HTN, GERD, +PPD, arthritis, skin cancer (BCC), bilateral IHR, T&A. For anesthesia history, she reported laryngeal spasm for 15 seconds during endoscopy in 2011. PCP is Dr. Thressa Sheller.  Cardiologist is Dr. Gwenlyn Found who cleared her for surgery with permission to hold Plavix 5 days preoperatively.  GI is Dr. Benson Norway.   EKG on 09/20/13 showed: NSR, LAFB.  Nuclear stress test on 09/29/13 was uninterpretable due to poor image capture.  However, Dr. Gwenlyn Found reviewed the images himself and felt that they were low risk.  She had a cardiac cath on 05/13/12 following abnormal stress test done for atypical chest pain with new EKG changes.  Results showed:  1. Left main; normal. 2. LAD; 80% short concentric proximal. S/P DES to proximal LAD. 3. Left circumflex; non-dominant and normal.  4. Right coronary artery; totally occluded proximally with faint left-to-right collaterals.  5. The overall LVEF estimated 60 % Without wall motion abnormalities.  Carotid duplex on 10/20/12 showed: Mild bilateral carotid bifurcation plaque resulting in less than 50% diameter stenosis. The exam does not exclude plaque ulceration or embolization. Continued surveillance recommended.  She had a normal abdominal aortic duplex evaluation on 03/09/13.  CXR on 12/01/13 showed no acute findings.   Preoperative labs noted.   She has been cleared by her cardiologist.  Further evaluation by her assigned anesthesiologist on the day of surgery to discuss the definitive anesthesia plan.  [facility]/Anesthesiology Phone [phone] 12/01/2013 3:36 PM

## 2013-12-06 ENCOUNTER — Other Ambulatory Visit (INDEPENDENT_AMBULATORY_CARE_PROVIDER_SITE_OTHER): Payer: Medicare Other

## 2013-12-06 DIAGNOSIS — Z79899 Other long term (current) drug therapy: Secondary | ICD-10-CM | POA: Diagnosis not present

## 2013-12-06 DIAGNOSIS — E785 Hyperlipidemia, unspecified: Secondary | ICD-10-CM | POA: Diagnosis not present

## 2013-12-06 LAB — LIPID PANEL
Cholesterol: 212 mg/dL — ABNORMAL HIGH (ref 0–200)
HDL: 53.5 mg/dL (ref >39.00)
LDL Cholesterol: 142 mg/dL — ABNORMAL HIGH (ref 0–99)
NonHDL: 158.5
Total CHOL/HDL Ratio: 4
Triglycerides: 81 mg/dL (ref 0.0–149.0)
VLDL: 16.2 mg/dL (ref 0.0–40.0)

## 2013-12-06 LAB — HEPATIC FUNCTION PANEL
ALT: 33 U/L (ref 0–35)
AST: 28 U/L (ref 0–37)
Albumin: 3.9 g/dL (ref 3.5–5.2)
Alkaline Phosphatase: 63 U/L (ref 39–117)
Bilirubin, Direct: 0 mg/dL (ref 0.0–0.3)
Total Bilirubin: 0.6 mg/dL (ref 0.2–1.2)
Total Protein: 7 g/dL (ref 6.0–8.3)

## 2013-12-07 NOTE — Progress Notes (Signed)
Dr Ulyses Amor office called about requested records from 2012 endoscopy.

## 2013-12-08 ENCOUNTER — Ambulatory Visit: Payer: Medicare Other | Admitting: Pharmacist

## 2013-12-08 ENCOUNTER — Ambulatory Visit (INDEPENDENT_AMBULATORY_CARE_PROVIDER_SITE_OTHER): Payer: Medicare Other | Admitting: Pharmacist

## 2013-12-08 VITALS — Wt 137.0 lb

## 2013-12-08 DIAGNOSIS — E785 Hyperlipidemia, unspecified: Secondary | ICD-10-CM | POA: Diagnosis not present

## 2013-12-08 DIAGNOSIS — I2510 Atherosclerotic heart disease of native coronary artery without angina pectoris: Secondary | ICD-10-CM | POA: Diagnosis not present

## 2013-12-08 DIAGNOSIS — Z79899 Other long term (current) drug therapy: Secondary | ICD-10-CM

## 2013-12-08 DIAGNOSIS — I10 Essential (primary) hypertension: Secondary | ICD-10-CM | POA: Diagnosis not present

## 2013-12-08 MED ORDER — PITAVASTATIN CALCIUM 2 MG PO TABS: 1.0000 mg | ORAL_TABLET | Freq: Every day | ORAL | Status: DC

## 2013-12-08 MED ORDER — PITAVASTATIN CALCIUM 2 MG PO TABS: ORAL_TABLET | ORAL | Status: DC

## 2013-12-08 NOTE — Patient Instructions (Signed)
1.  Continue Livalo 1 mg daily (okay to split 2 mg pills into 1/2 daily) 2.  Cut Zetia into 1/2 tablet daily instead to see if this helps with cramping.  3.  Recheck cholesterol and liver in 4 months (fasting lab 04/04/14 - show up anytime after 7:30 am), and see Ysidro Evert 2 days later (04/06/14 at 8:30 am)  Call Ysidro Evert if problems [phone]

## 2013-12-08 NOTE — Progress Notes (Signed)
Patient is a 73 y.o. WF referred to lipid clinic by Dr. Gwenlyn Found given h/o CAD (s/p PCI of LAD in 2013) and h/o statin intolerance. Since our last visit she has added Livalo 1 mg qd to her Zetia 10 mg qd.  Her LDL was 192 mg/dL on Zetia monotherapy, and dropped to 142 mg/dL since adding Livalo (our lab).  She tells me that LDL was 111 mg/dL on this same regimen last month at [facility], and showed me the lab to confirm.  She tells me that she is tolerating this "fairly well" however does tell me that she is still getting some periodic soreness in her arms first thing in the morning ("like they are falling asleep").   Patient has failed multiple statins in the past due to muscle soreness and arm/hands going to sleep.  Most recently stopped Crestor due to LFT elevation which resolved following patient stopping Crestor.  Patient failed Crestor years ago initially due to muscle soreness, but was restarted on this 04/2012 following PCI, and tolerated it pretty well for the past year.  LFTs were normal while on Crestor until recently when ALT went up 196 U/L.  Patient had an abdominal U/S which showed it could possibly be fatty liver, and also saw Dr Silvio Pate.  Hepatis markers were negative.   After stopping Crestor for a few weeks, LFTs returned to normal.  LFTs normal today on Livalo / Zetia.  She tried a holistic approach with CholestOff/fiber/fish oil, but this didn't appear to improve cholesterol per patient.  She has multiple brothers and sisters who have elevated cholesterol, heart disease, and diabetes.  She tells me that when she was around 73 y.o., her LDL went up significantly, which was around the time of menopause.  Has tried multiple lipid lowering medications since then.  Patient already eating a low fat diet.  She is scheduled to have neck surgery C4-C7 next week, and hopes some of her pains will improve after having this surgery.  RF:  CAD, insulin resistance, HTN, age - LDL goal < 70,  non-HDL goal < 100 Meds:  Zetia 10 mg qd, Livalo 1 mg qd Intolerant:  Lipitor, Zocor, Crestor all caused muscle aches.  Crestor 5 mg qd appears to have caused elevated LFTs (ALT 196 U/L) - they resolved after stopping Crestor for a few weeks.  Labs: 11/2013:  LDL 142, TC 212, TG 81, HDL 54, LFTs normal (our lab) - Livalo 1 mg qd + Zetia 10 mg q 10/2013:  LDL was 111 mg/dL at [facility] (Livalo 1 mg + Zetia 10 mg qd) 09/2013:  TC 275, LDL 192, HDL 46, TG 183, ALT 42, AST 27 (Zetia 10 mg qd only) 08/2013:  ALT 196, AST 41 (Crestor 5 mg qd, Zetia 10 mg qd) -- LDL historically ~ 90 mg/dL on Crestor 5 mg qd + Zetia 10 mg qd  Current Outpatient Prescriptions  Medication Sig Dispense Refill  . acetaminophen (TYLENOL) 500 MG tablet Take 500 mg by mouth every 6 (six) hours as needed for mild pain or headache.      Marland Kitchen aspirin 81 MG tablet Take 81 mg by mouth daily.      . calcium-vitamin D (OSCAL WITH D) 500-200 MG-UNIT per tablet Take 2 tablets by mouth daily.      . carvedilol (COREG) 6.25 MG tablet Take 6.25 mg by mouth 2 (two) times daily with a meal.       . Cholecalciferol (VITAMIN D3) 2000 UNITS  TABS Take 2,000 Units by mouth 2 (two) times daily.      . clopidogrel (PLAVIX) 75 MG tablet Take 1 tablet (75 mg total) by mouth daily.  90 tablet  3  . diclofenac sodium (VOLTAREN) 1 % GEL Apply 2 g topically daily as needed. For pain      . ezetimibe (ZETIA) 10 MG tablet Take 1 tablet (10 mg total) by mouth daily.  14 tablet  0  . fish oil-omega-3 fatty acids 1000 MG capsule Take 1 g by mouth daily.       . irbesartan (AVAPRO) 150 MG tablet Take 150 mg by mouth daily before breakfast.       . Multiple Vitamin (MULTIVITAMIN WITH MINERALS) TABS Take 1 tablet by mouth daily.      . nitroGLYCERIN (NITROSTAT) 0.4 MG SL tablet Place 0.4 mg under the tongue every 5 (five) minutes as needed for chest pain.      Marland Kitchen OVER THE COUNTER MEDICATION Take 1 tablet by mouth as needed (for indigestion).  [employer] gummies      . Pitavastatin Calcium (LIVALO) 2 MG TABS Take 0.5 tablets (1 mg total) by mouth at bedtime.  30 tablet  3   No current facility-administered medications for this visit.   Allergies  Allergen Reactions  . Statins Other (See Comments)    "intermittent loss of circulation; hands and arms will go to sleep; feet will get cramps; fatigue" (05/13/2012).  This occurred with Lipitor, Zocor, Crestor 5 mg qd, and she thinks pravastatin as well   Family History  Problem Relation Age of Onset  . Hypertension Mother   . Diabetes Mother   . Hyperlipidemia Father   . Heart disease Father   . Stroke Father   . Heart disease Sister   . Diabetes Sister   . Heart disease Brother

## 2013-12-08 NOTE — Assessment & Plan Note (Addendum)
LDL much improved since adding Livalo 1 mg qd, however LDL was 111 mg/dL at [facility] (10/2013), and now LDL 142 mg/dL at our lab (11/2013).  LDL was 192 mg/dL on Zetia monotherapy a few months ago.  She feels she is getting some arm aches / issues again, and she feels it is due to Zetia.  She is due to have neck surgery next week, and hopes this will help with her aches as well.  She wants to try and come off Zetia.  I discussed cutting it down to 5 mg qd as she will still get 80% of LDL lowering with this dose, but lower risk of side effects - she is agreeable to this.  Will not change Livalo dose given upcoming surgery, and also due to LDL down to 111 mg/dL with [facility] last month, but will recheck lipid / liver in 4 months.  If LDL remains elevated in 4 months, will likely try to titrate Livalo to 2 mg qd.  If she can't tolerate higher doses of Livalo, could always try Livalo 1 mg qd again + PCSK-9 inhibitor trial.   Plan: 1.  Continue Livalo 1 mg daily (okay to split 2 mg pills into 1/2 daily) 2.  Cut Zetia into 1/2 tablet daily instead to see if this helps with cramping.  3.  Recheck cholesterol and liver in 4 months (fasting lab 04/04/14 - show up anytime after 7:30 am), and see Ysidro Evert 2 days later (04/06/14 at 8:30 am)

## 2013-12-09 ENCOUNTER — Telehealth: Payer: Self-pay | Admitting: Cardiovascular Disease

## 2013-12-09 NOTE — Telephone Encounter (Signed)
Need an authorization for a Livalo. She said [facility] faxed it over yesterday,they have not heard from it. Please fax it today if possible.

## 2013-12-09 NOTE — Progress Notes (Signed)
Re-requested endoscopy note from Dr Ulyses Amor office.

## 2013-12-12 ENCOUNTER — Telehealth: Payer: Self-pay | Admitting: Cardiovascular Disease

## 2013-12-12 MED ORDER — PITAVASTATIN CALCIUM 2 MG PO TABS: 1.0000 mg | ORAL_TABLET | Freq: Every day | ORAL | Status: DC

## 2013-12-12 MED ORDER — DEXAMETHASONE SODIUM PHOSPHATE 10 MG/ML IJ SOLN
10.0000 mg | INTRAMUSCULAR | Status: AC
  Administered 2013-12-13: 10 mg via INTRAVENOUS

## 2013-12-12 MED ORDER — CEFAZOLIN SODIUM-DEXTROSE 2-3 GM-% IV SOLR
2.0000 g | INTRAVENOUS | Status: AC
  Administered 2013-12-13: 2 g via INTRAVENOUS
  Filled 2013-12-12: qty 50

## 2013-12-12 NOTE — Telephone Encounter (Signed)
Spoke to patient she stated she will need a prior authorization for livalo.Stated she is unable to take crestor,simvastatin,pravastatin due to side effects.[facility] called [phone].Prior Authorization approved # [id number].Prescription sent to pharmacy.

## 2013-12-12 NOTE — Telephone Encounter (Signed)
Brittany Figueroa is calling again about her Livalo  .will be going into the hospital tomorrow for surgery and will be out of it .Marland Kitchen Please call   Thanks

## 2013-12-12 NOTE — Telephone Encounter (Signed)
Patient has been prescribed Livalo and it requires prior auth thru her insurance.  Can she be switched to Pravastatin or Simvastatin?  Please let the pharmacy know.

## 2013-12-13 ENCOUNTER — Encounter (HOSPITAL_COMMUNITY): Payer: Self-pay | Admitting: Surgery

## 2013-12-13 ENCOUNTER — Encounter (HOSPITAL_COMMUNITY)
Admission: RE | Disposition: A | Discharge status: Home / Self Care | Payer: Self-pay | Source: Ambulatory Visit | Attending: Neurosurgery

## 2013-12-13 ENCOUNTER — Inpatient Hospital Stay (HOSPITAL_COMMUNITY): Payer: Medicare Other

## 2013-12-13 ENCOUNTER — Inpatient Hospital Stay (HOSPITAL_COMMUNITY)
Admission: RE | Admit: 2013-12-13 | Discharge: 2013-12-14 | DRG: 473 | Disposition: A | Discharge status: Home / Self Care | Payer: Medicare Other | Source: Ambulatory Visit | Attending: Neurosurgery | Admitting: Neurosurgery

## 2013-12-13 ENCOUNTER — Other Ambulatory Visit: Payer: Medicare Other

## 2013-12-13 ENCOUNTER — Inpatient Hospital Stay (HOSPITAL_COMMUNITY): Payer: Medicare Other | Admitting: Certified Registered Nurse Anesthetist

## 2013-12-13 ENCOUNTER — Encounter (HOSPITAL_COMMUNITY): Payer: Medicare Other | Admitting: Vascular Surgery

## 2013-12-13 DIAGNOSIS — Z9861 Coronary angioplasty status: Secondary | ICD-10-CM

## 2013-12-13 DIAGNOSIS — K219 Gastro-esophageal reflux disease without esophagitis: Secondary | ICD-10-CM | POA: Diagnosis not present

## 2013-12-13 DIAGNOSIS — I10 Essential (primary) hypertension: Secondary | ICD-10-CM | POA: Diagnosis present

## 2013-12-13 DIAGNOSIS — E7800 Pure hypercholesterolemia, unspecified: Secondary | ICD-10-CM | POA: Diagnosis present

## 2013-12-13 DIAGNOSIS — M4712 Other spondylosis with myelopathy, cervical region: Principal | ICD-10-CM | POA: Diagnosis present

## 2013-12-13 DIAGNOSIS — M79609 Pain in unspecified limb: Secondary | ICD-10-CM | POA: Diagnosis not present

## 2013-12-13 DIAGNOSIS — Z79899 Other long term (current) drug therapy: Secondary | ICD-10-CM

## 2013-12-13 DIAGNOSIS — Z7902 Long term (current) use of antithrombotics/antiplatelets: Secondary | ICD-10-CM

## 2013-12-13 DIAGNOSIS — M5090 Cervical disc disorder, unspecified, unspecified cervical region: Secondary | ICD-10-CM | POA: Diagnosis not present

## 2013-12-13 DIAGNOSIS — M4802 Spinal stenosis, cervical region: Secondary | ICD-10-CM | POA: Diagnosis not present

## 2013-12-13 DIAGNOSIS — Z85828 Personal history of other malignant neoplasm of skin: Secondary | ICD-10-CM

## 2013-12-13 DIAGNOSIS — Z7982 Long term (current) use of aspirin: Secondary | ICD-10-CM

## 2013-12-13 DIAGNOSIS — I2510 Atherosclerotic heart disease of native coronary artery without angina pectoris: Secondary | ICD-10-CM | POA: Diagnosis present

## 2013-12-13 DIAGNOSIS — Z87891 Personal history of nicotine dependence: Secondary | ICD-10-CM

## 2013-12-13 DIAGNOSIS — M47812 Spondylosis without myelopathy or radiculopathy, cervical region: Secondary | ICD-10-CM | POA: Diagnosis not present

## 2013-12-13 HISTORY — PX: ANTERIOR CERVICAL DECOMP/DISCECTOMY FUSION: SHX1161

## 2013-12-13 SURGERY — ANTERIOR CERVICAL DECOMPRESSION/DISCECTOMY FUSION 3 LEVELS
Anesthesia: General

## 2013-12-13 MED ORDER — HYDROMORPHONE HCL PF 1 MG/ML IJ SOLN: 0.5000 mg | INTRAMUSCULAR | Status: DC | PRN

## 2013-12-13 MED ORDER — FENTANYL CITRATE 0.05 MG/ML IJ SOLN
INTRAMUSCULAR | Status: DC | PRN
  Administered 2013-12-13: 25 ug via INTRAVENOUS
  Administered 2013-12-13 (×2): 50 ug via INTRAVENOUS
  Administered 2013-12-13: 150 ug via INTRAVENOUS

## 2013-12-13 MED ORDER — SODIUM CHLORIDE 0.9 % IR SOLN
Status: DC | PRN
  Administered 2013-12-13: 09:00:00

## 2013-12-13 MED ORDER — ALBUMIN HUMAN 5 % IV SOLN
INTRAVENOUS | Status: DC | PRN
  Administered 2013-12-13: 09:00:00 via INTRAVENOUS

## 2013-12-13 MED ORDER — HYDROMORPHONE HCL PF 1 MG/ML IJ SOLN
INTRAMUSCULAR | Status: AC
  Filled 2013-12-13: qty 1

## 2013-12-13 MED ORDER — ACETAMINOPHEN 325 MG PO TABS: 650.0000 mg | ORAL_TABLET | ORAL | Status: DC | PRN

## 2013-12-13 MED ORDER — PROPOFOL 10 MG/ML IV BOLUS
INTRAVENOUS | Status: AC
  Filled 2013-12-13: qty 20

## 2013-12-13 MED ORDER — MENTHOL 3 MG MT LOZG
1.0000 | LOZENGE | OROMUCOSAL | Status: DC | PRN
  Filled 2013-12-13: qty 9

## 2013-12-13 MED ORDER — NEOSTIGMINE METHYLSULFATE 10 MG/10ML IV SOLN
INTRAVENOUS | Status: AC
Start: 2013-12-13 — End: 2013-12-13
  Filled 2013-12-13: qty 1

## 2013-12-13 MED ORDER — HYDROMORPHONE HCL PF 1 MG/ML IJ SOLN
0.2500 mg | INTRAMUSCULAR | Status: DC | PRN
  Administered 2013-12-13 (×2): 0.5 mg via INTRAVENOUS

## 2013-12-13 MED ORDER — FENTANYL CITRATE 0.05 MG/ML IJ SOLN
INTRAMUSCULAR | Status: AC
  Filled 2013-12-13: qty 5

## 2013-12-13 MED ORDER — NEOSTIGMINE METHYLSULFATE 10 MG/10ML IV SOLN
INTRAVENOUS | Status: AC
  Filled 2013-12-13: qty 2

## 2013-12-13 MED ORDER — ACETAMINOPHEN 650 MG RE SUPP: 650.0000 mg | RECTAL | Status: DC | PRN

## 2013-12-13 MED ORDER — ONDANSETRON HCL 4 MG/2ML IJ SOLN: 4.0000 mg | Freq: Once | INTRAMUSCULAR | Status: DC | PRN

## 2013-12-13 MED ORDER — IRBESARTAN 150 MG PO TABS
150.0000 mg | ORAL_TABLET | Freq: Every day | ORAL | Status: DC
  Administered 2013-12-14: 150 mg via ORAL
  Filled 2013-12-13 (×2): qty 1

## 2013-12-13 MED ORDER — CALCIUM CARBONATE-VITAMIN D 500-200 MG-UNIT PO TABS
2.0000 | ORAL_TABLET | Freq: Every day | ORAL | Status: DC
  Filled 2013-12-13 (×2): qty 2

## 2013-12-13 MED ORDER — MIDAZOLAM HCL 5 MG/5ML IJ SOLN
INTRAMUSCULAR | Status: DC | PRN
  Administered 2013-12-13: 2 mg via INTRAVENOUS

## 2013-12-13 MED ORDER — SENNA 8.6 MG PO TABS
1.0000 | ORAL_TABLET | Freq: Two times a day (BID) | ORAL | Status: DC
  Administered 2013-12-13 (×2): 8.6 mg via ORAL
  Filled 2013-12-13 (×4): qty 1

## 2013-12-13 MED ORDER — SODIUM CHLORIDE 0.9 % IV SOLN: 250.0000 mL | INTRAVENOUS | Status: DC

## 2013-12-13 MED ORDER — GLYCOPYRROLATE 0.2 MG/ML IJ SOLN
INTRAMUSCULAR | Status: DC | PRN
  Administered 2013-12-13: .4 mg via INTRAVENOUS

## 2013-12-13 MED ORDER — MIDAZOLAM HCL 2 MG/2ML IJ SOLN
INTRAMUSCULAR | Status: AC
  Filled 2013-12-13: qty 2

## 2013-12-13 MED ORDER — SUCCINYLCHOLINE CHLORIDE 20 MG/ML IJ SOLN
INTRAMUSCULAR | Status: AC
  Filled 2013-12-13: qty 1

## 2013-12-13 MED ORDER — ALUM & MAG HYDROXIDE-SIMETH 200-200-20 MG/5ML PO SUSP: 30.0000 mL | Freq: Four times a day (QID) | ORAL | Status: DC | PRN

## 2013-12-13 MED ORDER — NEOSTIGMINE METHYLSULFATE 10 MG/10ML IV SOLN
INTRAVENOUS | Status: DC | PRN
  Administered 2013-12-13: 3 mg via INTRAVENOUS

## 2013-12-13 MED ORDER — ROCURONIUM BROMIDE 50 MG/5ML IV SOLN
INTRAVENOUS | Status: AC
  Filled 2013-12-13: qty 1

## 2013-12-13 MED ORDER — ASPIRIN EC 81 MG PO TBEC
81.0000 mg | DELAYED_RELEASE_TABLET | Freq: Every day | ORAL | Status: DC
  Administered 2013-12-13: 81 mg via ORAL
  Filled 2013-12-13 (×2): qty 1

## 2013-12-13 MED ORDER — SODIUM CHLORIDE 0.9 % IJ SOLN
3.0000 mL | Freq: Two times a day (BID) | INTRAMUSCULAR | Status: DC
  Administered 2013-12-13: 3 mL via INTRAVENOUS

## 2013-12-13 MED ORDER — ONDANSETRON HCL 4 MG/2ML IJ SOLN
INTRAMUSCULAR | Status: AC
  Filled 2013-12-13: qty 2

## 2013-12-13 MED ORDER — THROMBIN 5000 UNITS EX SOLR
OROMUCOSAL | Status: DC | PRN
  Administered 2013-12-13: 10:00:00 via TOPICAL

## 2013-12-13 MED ORDER — CYCLOBENZAPRINE HCL 10 MG PO TABS
10.0000 mg | ORAL_TABLET | Freq: Three times a day (TID) | ORAL | Status: DC | PRN
  Administered 2013-12-13 (×2): 10 mg via ORAL
  Filled 2013-12-13 (×2): qty 1

## 2013-12-13 MED ORDER — GELATIN ABSORBABLE 100 EX MISC
CUTANEOUS | Status: DC | PRN
  Administered 2013-12-13: 09:00:00 via TOPICAL

## 2013-12-13 MED ORDER — EPHEDRINE SULFATE 50 MG/ML IJ SOLN
INTRAMUSCULAR | Status: AC
  Filled 2013-12-13: qty 1

## 2013-12-13 MED ORDER — NITROGLYCERIN 0.4 MG SL SUBL: 0.4000 mg | SUBLINGUAL_TABLET | SUBLINGUAL | Status: DC | PRN

## 2013-12-13 MED ORDER — LIDOCAINE HCL 4 % MT SOLN
OROMUCOSAL | Status: DC | PRN
  Administered 2013-12-13: 4 mL via TOPICAL

## 2013-12-13 MED ORDER — CEFAZOLIN SODIUM 1-5 GM-% IV SOLN
1.0000 g | Freq: Three times a day (TID) | INTRAVENOUS | Status: AC
  Administered 2013-12-13 – 2013-12-14 (×2): 1 g via INTRAVENOUS
  Filled 2013-12-13 (×3): qty 50

## 2013-12-13 MED ORDER — LIDOCAINE HCL (CARDIAC) 20 MG/ML IV SOLN
INTRAVENOUS | Status: DC | PRN
  Administered 2013-12-13: 100 mg via INTRAVENOUS

## 2013-12-13 MED ORDER — PROPOFOL 10 MG/ML IV BOLUS
INTRAVENOUS | Status: DC | PRN
  Administered 2013-12-13: 120 mg via INTRAVENOUS

## 2013-12-13 MED ORDER — EZETIMIBE 10 MG PO TABS
10.0000 mg | ORAL_TABLET | Freq: Every day | ORAL | Status: DC
  Administered 2013-12-13: 10 mg via ORAL
  Filled 2013-12-13 (×2): qty 1

## 2013-12-13 MED ORDER — STERILE WATER FOR INJECTION IJ SOLN
INTRAMUSCULAR | Status: AC
  Filled 2013-12-13: qty 10

## 2013-12-13 MED ORDER — OXYCODONE HCL 5 MG/5ML PO SOLN: 5.0000 mg | Freq: Once | ORAL | Status: DC | PRN

## 2013-12-13 MED ORDER — LACTATED RINGERS IV SOLN
INTRAVENOUS | Status: DC | PRN
  Administered 2013-12-13 (×2): via INTRAVENOUS

## 2013-12-13 MED ORDER — OXYCODONE HCL 5 MG PO TABS: 5.0000 mg | ORAL_TABLET | Freq: Once | ORAL | Status: DC | PRN

## 2013-12-13 MED ORDER — PHENOL 1.4 % MT LIQD
1.0000 | OROMUCOSAL | Status: DC | PRN
  Administered 2013-12-13: 1 via OROMUCOSAL
  Filled 2013-12-13: qty 177

## 2013-12-13 MED ORDER — 0.9 % SODIUM CHLORIDE (POUR BTL) OPTIME
TOPICAL | Status: DC | PRN
  Administered 2013-12-13: 1000 mL

## 2013-12-13 MED ORDER — DEXAMETHASONE SODIUM PHOSPHATE 4 MG/ML IJ SOLN
INTRAMUSCULAR | Status: AC
  Filled 2013-12-13: qty 2

## 2013-12-13 MED ORDER — CARVEDILOL 6.25 MG PO TABS
6.2500 mg | ORAL_TABLET | Freq: Two times a day (BID) | ORAL | Status: DC
  Administered 2013-12-13 – 2013-12-14 (×2): 6.25 mg via ORAL
  Filled 2013-12-13 (×4): qty 1

## 2013-12-13 MED ORDER — LIDOCAINE HCL (CARDIAC) 20 MG/ML IV SOLN
INTRAVENOUS | Status: AC
  Filled 2013-12-13: qty 10

## 2013-12-13 MED ORDER — ROCURONIUM BROMIDE 100 MG/10ML IV SOLN
INTRAVENOUS | Status: DC | PRN
  Administered 2013-12-13: 50 mg via INTRAVENOUS

## 2013-12-13 MED ORDER — OXYCODONE-ACETAMINOPHEN 5-325 MG PO TABS: 1.0000 | ORAL_TABLET | ORAL | Status: DC | PRN

## 2013-12-13 MED ORDER — GLYCOPYRROLATE 0.2 MG/ML IJ SOLN
INTRAMUSCULAR | Status: AC
  Filled 2013-12-13: qty 2

## 2013-12-13 MED ORDER — ONDANSETRON HCL 4 MG/2ML IJ SOLN: 4.0000 mg | INTRAMUSCULAR | Status: DC | PRN

## 2013-12-13 MED ORDER — SODIUM CHLORIDE 0.9 % IJ SOLN: 3.0000 mL | INTRAMUSCULAR | Status: DC | PRN

## 2013-12-13 MED ORDER — HYDROCODONE-ACETAMINOPHEN 5-325 MG PO TABS
1.0000 | ORAL_TABLET | ORAL | Status: DC | PRN
  Administered 2013-12-13 – 2013-12-14 (×4): 2 via ORAL
  Filled 2013-12-13 (×4): qty 2

## 2013-12-13 MED ORDER — ONDANSETRON HCL 4 MG/2ML IJ SOLN
INTRAMUSCULAR | Status: DC | PRN
  Administered 2013-12-13: 4 mg via INTRAVENOUS

## 2013-12-13 MED ORDER — MEPERIDINE HCL 25 MG/ML IJ SOLN: 6.2500 mg | INTRAMUSCULAR | Status: DC | PRN

## 2013-12-13 MED ORDER — EPHEDRINE SULFATE 50 MG/ML IJ SOLN
INTRAMUSCULAR | Status: DC | PRN
  Administered 2013-12-13 (×3): 10 mg via INTRAVENOUS

## 2013-12-13 SURGICAL SUPPLY — 66 items
APL SKNCLS STERI-STRIP NONHPOA (GAUZE/BANDAGES/DRESSINGS) ×1
BAG DECANTER FOR FLEXI CONT (MISCELLANEOUS) ×2 IMPLANT
BENZOIN TINCTURE PRP APPL 2/3 (GAUZE/BANDAGES/DRESSINGS) ×2 IMPLANT
BLADE 10 SAFETY STRL DISP (BLADE) ×2 IMPLANT
BRUSH SCRUB EZ PLAIN DRY (MISCELLANEOUS) ×2 IMPLANT
BUR MATCHSTICK NEURO 3.0 LAGG (BURR) ×2 IMPLANT
CAGE PEEK 5X14X11 (Cage) ×1 IMPLANT
CAGE PEEK 6X14X11 (Cage) ×4 IMPLANT
CAGE SPNL 11X14X6XRADOPQ (Cage) IMPLANT
CANISTER SUCT 3000ML (MISCELLANEOUS) ×2 IMPLANT
CONT SPEC 4OZ CLIKSEAL STRL BL (MISCELLANEOUS) ×2 IMPLANT
DRAPE C-ARM 42X72 X-RAY (DRAPES) ×4 IMPLANT
DRAPE LAPAROTOMY 100X72 PEDS (DRAPES) ×2 IMPLANT
DRAPE MICROSCOPE LEICA (MISCELLANEOUS) ×2 IMPLANT
DRAPE POUCH INSTRU U-SHP 10X18 (DRAPES) ×2 IMPLANT
DRILL BIT (BIT) ×1 IMPLANT
DURAPREP 6ML APPLICATOR 50/CS (WOUND CARE) ×2 IMPLANT
ELECT COATED BLADE 2.86 ST (ELECTRODE) ×2 IMPLANT
ELECT REM PT RETURN 9FT ADLT (ELECTROSURGICAL) ×2
ELECTRODE REM PT RTRN 9FT ADLT (ELECTROSURGICAL) ×1 IMPLANT
EVACUATOR 1/8 PVC DRAIN (DRAIN) ×1 IMPLANT
GAUZE SPONGE 4X4 16PLY XRAY LF (GAUZE/BANDAGES/DRESSINGS) IMPLANT
GLOVE BIOGEL PI IND STRL 7.0 (GLOVE) IMPLANT
GLOVE BIOGEL PI IND STRL 7.5 (GLOVE) IMPLANT
GLOVE BIOGEL PI IND STRL 8.5 (GLOVE) IMPLANT
GLOVE BIOGEL PI INDICATOR 7.0 (GLOVE) ×3
GLOVE BIOGEL PI INDICATOR 7.5 (GLOVE) ×3
GLOVE BIOGEL PI INDICATOR 8.5 (GLOVE) ×1
GLOVE ECLIPSE 8.5 STRL (GLOVE) ×3 IMPLANT
GLOVE ECLIPSE 9.0 STRL (GLOVE) ×2 IMPLANT
GLOVE EXAM NITRILE LRG STRL (GLOVE) IMPLANT
GLOVE EXAM NITRILE MD LF STRL (GLOVE) IMPLANT
GLOVE EXAM NITRILE XL STR (GLOVE) IMPLANT
GLOVE EXAM NITRILE XS STR PU (GLOVE) IMPLANT
GLOVE SS BIOGEL STRL SZ 6.5 (GLOVE) IMPLANT
GLOVE SUPERSENSE BIOGEL SZ 6.5 (GLOVE) ×3
GLOVE SURG SS PI 7.0 STRL IVOR (GLOVE) ×3 IMPLANT
GOWN STRL REUS W/ TWL LRG LVL3 (GOWN DISPOSABLE) IMPLANT
GOWN STRL REUS W/ TWL XL LVL3 (GOWN DISPOSABLE) IMPLANT
GOWN STRL REUS W/TWL 2XL LVL3 (GOWN DISPOSABLE) ×1 IMPLANT
GOWN STRL REUS W/TWL LRG LVL3 (GOWN DISPOSABLE) ×2
GOWN STRL REUS W/TWL XL LVL3 (GOWN DISPOSABLE) ×6
HALTER HD/CHIN CERV TRACTION D (MISCELLANEOUS) ×2 IMPLANT
KIT BASIN OR (CUSTOM PROCEDURE TRAY) ×2 IMPLANT
KIT ROOM TURNOVER OR (KITS) ×2 IMPLANT
NDL SPNL 20GX3.5 QUINCKE YW (NEEDLE) ×1 IMPLANT
NEEDLE SPNL 20GX3.5 QUINCKE YW (NEEDLE) ×2 IMPLANT
NS IRRIG 1000ML POUR BTL (IV SOLUTION) ×2 IMPLANT
PACK LAMINECTOMY NEURO (CUSTOM PROCEDURE TRAY) ×2 IMPLANT
PAD ARMBOARD 7.5X6 YLW CONV (MISCELLANEOUS) ×6 IMPLANT
PLATE 3 55XLCK NS SPNE CVD (Plate) IMPLANT
PLATE 3 ATLANTIS TRANS (Plate) ×2 IMPLANT
RUBBERBAND STERILE (MISCELLANEOUS) ×4 IMPLANT
SCREW ST FIX 4 ATL 3120213 (Screw) ×8 IMPLANT
SPONGE GAUZE 4X4 12PLY (GAUZE/BANDAGES/DRESSINGS) ×2 IMPLANT
SPONGE INTESTINAL PEANUT (DISPOSABLE) ×2 IMPLANT
SPONGE SURGIFOAM ABS GEL 100 (HEMOSTASIS) ×2 IMPLANT
STRIP CLOSURE SKIN 1/2X4 (GAUZE/BANDAGES/DRESSINGS) ×2 IMPLANT
SUT PDS AB 5-0 P3 18 (SUTURE) ×2 IMPLANT
SUT VIC AB 3-0 SH 8-18 (SUTURE) ×2 IMPLANT
SYR 20ML ECCENTRIC (SYRINGE) ×2 IMPLANT
TAPE CLOTH 4X10 WHT NS (GAUZE/BANDAGES/DRESSINGS) ×2 IMPLANT
TOWEL OR 17X24 6PK STRL BLUE (TOWEL DISPOSABLE) ×2 IMPLANT
TOWEL OR 17X26 10 PK STRL BLUE (TOWEL DISPOSABLE) ×2 IMPLANT
TRAP SPECIMEN MUCOUS 40CC (MISCELLANEOUS) ×2 IMPLANT
WATER STERILE IRR 1000ML POUR (IV SOLUTION) ×2 IMPLANT

## 2013-12-13 NOTE — Progress Notes (Signed)
Utilization review completed.  

## 2013-12-13 NOTE — Anesthesia Postprocedure Evaluation (Signed)
Anesthesia Post Note  Patient: Brittany Figueroa  Procedure(s) Performed: Procedure(s) (LRB): ANTERIOR CERVICAL DECOMPRESSION/DISCECTOMY FUSION 3 LEVELS Cervical four/five,five/six,six/seven. Anterior cervical disectomy and fusion with peek and plate  (N/A)  Anesthesia type: general  Patient location: PACU  Post pain: Pain level controlled  Post assessment: Patient's Cardiovascular Status Stable  Last Vitals:  Filed Vitals:   12/13/13 1130  BP: 174/90  Pulse: 75  Temp: 36.5 C  Resp: 18    Post vital signs: Reviewed and stable  Level of consciousness: sedated  Complications: No apparent anesthesia complications

## 2013-12-13 NOTE — Transfer of Care (Signed)
Immediate Anesthesia Transfer of Care Note  Patient: Brittany Figueroa  Procedure(s) Performed: Procedure(s): ANTERIOR CERVICAL DECOMPRESSION/DISCECTOMY FUSION 3 LEVELS Cervical four/five,five/six,six/seven. Anterior cervical disectomy and fusion with peek and plate  (N/A)  Patient Location: PACU  Anesthesia Type:General  Level of Consciousness: awake and alert   Airway & Oxygen Therapy: Patient Spontanous Breathing and Patient connected to nasal cannula oxygen  Post-op Assessment: Report given to PACU RN, Post -op Vital signs reviewed and stable and Patient moving all extremities X 4  Post vital signs: Reviewed and stable  Complications: No apparent anesthesia complications

## 2013-12-13 NOTE — H&P (Signed)
Brittany Figueroa is an 73 y.o. female.   Chief Complaint: Neck and bilateral arm pain   HPI: 73 year old female with progressive neck and bilateral upper extremity symptoms consistent with a moderate cervical myelopathy. Workup demonstrates evidence of marked cervical spondylosis with stenosis at C4-5, C5-6, C6-7. Patient presents now for three-level anterior cervical decompression and fusion.  Past Medical History  Diagnosis Date  . Complication of anesthesia 12/2009    "had endoscopy; larynx went into spasms; stopped breathing for 15 seconds" (05/13/2012)  . Hypercholesteremia   . Hypertension   . GERD (gastroesophageal reflux disease) 2011  . Abnormal finding on EKG, new anterolateral T-wave inversions  05/14/2012  . Abnormal nuclear stress test, with infero-lateral ischemia 05/14/2012  . Atypical angina 05/14/2012  . CAD (coronary artery disease), 05/13/12, with 80% LAD 05/14/2012  . S/P angioplasty with stent, LAD 05/13/12 05/14/2012  . Tuberculosis     test +- [facility] [facility] - Dr. [name]- CXR- OK  . Arthritis     "right thumb; all my fingers" (05/13/2012), cervical spondylosis   . [facility] arthritis, thumb, degenerative   . Cancer     basal cell on facial in L temporal region      Past Surgical History  Procedure Laterality Date  . Tonsillectomy and adenoidectomy  1950's  . Inguinal hernia repair  ~ 1966; 07/21/2002    "right; left" (05/13/2012)  . Osteotomy and ulnar shortening  07/21/2002    "right" (05/13/2012)  . Forearm / wrist tendon lesion excision  ?11/2001    "right; did waver to try to get ulnar out of hole that it had cut" (05/13/2012  . Dilation and curettage of uterus  1960's; 1970's; 1980    "probably 3" (05/13/2012)  . Tubal ligation  1980  . Coronary angioplasty with stent placement  05/13/2012    DES to LAD; she has total RCA with left to rt coll. normal LV function done for positive nuc study    Family History  Problem Relation Age of Onset  . Hypertension Mother    . Diabetes Mother   . Hyperlipidemia Father   . Heart disease Father   . Stroke Father   . Heart disease Sister   . Diabetes Sister   . Heart disease Brother    Social History:  reports that she quit smoking about 25 years ago. Her smoking use included Cigarettes. She has a 10 pack-year smoking history. She has never used smokeless tobacco. She reports that she drinks about 7.2 ounces of alcohol per week. She reports that she does not use illicit drugs.  Allergies:  Allergies  Allergen Reactions  . Statins Other (See Comments)    "intermittent loss of circulation; hands and arms will go to sleep; feet will get cramps; fatigue" (05/13/2012).  This occurred with Lipitor, Zocor, Crestor 5 mg qd, and she thinks pravastatin as well    Medications Prior to Admission  Medication Sig Dispense Refill  . aspirin 81 MG tablet Take 81 mg by mouth daily.      . calcium-vitamin D (OSCAL WITH D) 500-200 MG-UNIT per tablet Take 2 tablets by mouth daily.      . carvedilol (COREG) 6.25 MG tablet Take 6.25 mg by mouth 2 (two) times daily with a meal.       . Cholecalciferol (VITAMIN D3) 2000 UNITS TABS Take 2,000 Units by mouth 2 (two) times daily.      . clopidogrel (PLAVIX) 75 MG tablet Take 1 tablet (75 mg total) by mouth  daily.  90 tablet  3  . diclofenac sodium (VOLTAREN) 1 % GEL Apply 2 g topically daily as needed. For pain      . ezetimibe (ZETIA) 10 MG tablet Take 1 tablet (10 mg total) by mouth daily.  14 tablet  0  . fish oil-omega-3 fatty acids 1000 MG capsule Take 1 g by mouth daily.       . irbesartan (AVAPRO) 150 MG tablet Take 150 mg by mouth daily before breakfast.       . Multiple Vitamin (MULTIVITAMIN WITH MINERALS) TABS Take 1 tablet by mouth daily.      . nitroGLYCERIN (NITROSTAT) 0.4 MG SL tablet Place 0.4 mg under the tongue every 5 (five) minutes as needed for chest pain.      . Pitavastatin Calcium (LIVALO) 2 MG TABS Take 0.5 tablets (1 mg total) by mouth daily.  30 tablet  6  .  acetaminophen (TYLENOL) 500 MG tablet Take 500 mg by mouth every 6 (six) hours as needed for mild pain or headache.      Marland Kitchen OVER THE COUNTER MEDICATION Take 1 tablet by mouth as needed (for indigestion). Bayer Brand Fruit Chew gummies        No results found for this or any previous visit (from the past 4 hour(s)). No results found.  Review of Systems  Constitutional: Negative.   HENT: Negative.   Eyes: Negative.   Respiratory: Negative.   Cardiovascular: Negative.   Gastrointestinal: Negative.   Genitourinary: Negative.   Musculoskeletal: Negative.   Skin: Negative.   Neurological: Negative.   Endo/Heme/Allergies: Negative.   Psychiatric/Behavioral: Negative.     Blood pressure 149/56, pulse 54, temperature 97.9 F (36.6 C), temperature source Oral, resp. rate 20, weight 62.143 kg (137 lb), SpO2 98.00%. Physical Exam  Constitutional: She is oriented to person, place, and time. She appears well-developed and well-nourished. No distress.  HENT:  Head: Normocephalic and atraumatic.  Right Ear: External ear normal.  Left Ear: External ear normal.  Nose: Nose normal.  Mouth/Throat: Oropharynx is clear and moist. No oropharyngeal exudate.  Eyes: Conjunctivae and EOM are normal. Pupils are equal, round, and reactive to light. Right eye exhibits no discharge. Left eye exhibits no discharge.  Neck: Normal range of motion. Neck supple.  Cardiovascular: Normal rate, regular rhythm, normal heart sounds and intact distal pulses.  Exam reveals no friction rub.   No murmur heard. Respiratory: Effort normal and breath sounds normal. No respiratory distress. She has no wheezes.  GI: Soft. Bowel sounds are normal. She exhibits no distension. There is no tenderness.  Musculoskeletal: Normal range of motion. She exhibits no edema and no tenderness.  Neurological: She is alert and oriented to person, place, and time. She has normal reflexes. She displays normal reflexes. No cranial nerve deficit.  She exhibits normal muscle tone. Coordination normal.  Skin: Skin is warm and dry. No rash noted. She is not diaphoretic. No erythema. No pallor.  Psychiatric: She has a normal mood and affect. Her behavior is normal. Judgment and thought content normal.     Assessment/Plan C4-5, C5-6, C6-7 spondylosis with myelopathy. Plan C4-5, C5-6, C6-7 anterior cervical discectomy and interbody fusion utilizing interbody peek cage, locally harvested autograft, and anterior plate instrumentation. Risks and benefits been explained. Patient wishes to proceed.  Princesa Willig A 12/13/2013, 7:33 AM

## 2013-12-13 NOTE — Anesthesia Preprocedure Evaluation (Addendum)
Anesthesia Evaluation  Patient identified by MRN, date of birth, ID band Patient awake    Reviewed: Allergy & Precautions, H&P , NPO status , Patient's Chart, lab work & pertinent test results  Airway Mallampati: I TM Distance: >3 FB Neck ROM: Full    Dental  (+) Dental Advisory Given, Teeth Intact   Pulmonary former smoker,          Cardiovascular hypertension, Pt. on medications and Pt. on home beta blockers + angina + CAD and + Cardiac Stents     Neuro/Psych    GI/Hepatic GERD-  Medicated and Controlled,  Endo/Other    Renal/GU      Musculoskeletal  (+) Arthritis -,   Abdominal   Peds  Hematology   Anesthesia Other Findings   Reproductive/Obstetrics                         Anesthesia Physical Anesthesia Plan  ASA: III  Anesthesia Plan: General   Post-op Pain Management:    Induction: Intravenous  Airway Management Planned: Oral ETT  Additional Equipment:   Intra-op Plan:   Post-operative Plan: Extubation in OR  Informed Consent: I have reviewed the patients History and Physical, chart, labs and discussed the procedure including the risks, benefits and alternatives for the proposed anesthesia with the patient or authorized representative who has indicated his/her understanding and acceptance.   Dental advisory given  Plan Discussed with: CRNA and Surgeon  Anesthesia Plan Comments:        Anesthesia Quick Evaluation

## 2013-12-13 NOTE — Anesthesia Procedure Notes (Signed)
Procedure Name: Intubation Date/Time: 12/13/2013 7:52 AM Performed by: Blair Heys E Pre-anesthesia Checklist: Patient identified, Emergency Drugs available, Suction available, Patient being monitored and Timeout performed Patient Re-evaluated:Patient Re-evaluated prior to inductionOxygen Delivery Method: Circle system utilized Preoxygenation: Pre-oxygenation with 100% oxygen Intubation Type: IV induction Ventilation: Mask ventilation without difficulty Laryngoscope Size: Miller and 2 Grade View: Grade I Tube type: Oral Tube size: 7.5 mm Number of attempts: 1 Airway Equipment and Method: Stylet and LTA kit utilized Placement Confirmation: ETT inserted through vocal cords under direct vision,  positive ETCO2 and breath sounds checked- equal and bilateral Secured at: 21 cm Tube secured with: Tape Dental Injury: Teeth and Oropharynx as per pre-operative assessment

## 2013-12-13 NOTE — Brief Op Note (Signed)
12/13/2013  10:01 AM  PATIENT:  Brittany Figueroa  73 y.o. female  PRE-OPERATIVE DIAGNOSIS:  spondylosis w/myelopathy  POST-OPERATIVE DIAGNOSIS:  spondylosis w/myelopathy  PROCEDURE:  Procedure(s): ANTERIOR CERVICAL DECOMPRESSION/DISCECTOMY FUSION 3 LEVELS Cervical four/five,five/six,six/seven. Anterior cervical disectomy and fusion with peek and plate  (N/A)  SURGEON:  Surgeon(s) and Role:    * [name], MD - Primary  PHYSICIAN ASSISTANT:   ASSISTANTS: [place]sner   ANESTHESIA:   general  EBL:  Total I/O In: 5035 [I.V.:1400; IV Piggyback:250] Out: 300 [Blood:300]  BLOOD ADMINISTERED:none  DRAINS: (Medium) Hemovact drain(s) in the Prevertebral space with  Suction Open   LOCAL MEDICATIONS USED:  NONE  SPECIMEN:  No Specimen  DISPOSITION OF SPECIMEN:  N/A  COUNTS:  YES  TOURNIQUET:  * No tourniquets in log *  DICTATION: .Dragon Dictation  PLAN OF CARE: Admit to inpatient   PATIENT DISPOSITION:  PACU - hemodynamically stable.   Delay start of Pharmacological VTE agent (>24hrs) due to surgical blood loss or risk of bleeding: yes

## 2013-12-13 NOTE — Op Note (Signed)
Date of procedure: 12/13/2013  Date of dictation: Same  Service: Neurosurgery  Preoperative diagnosis: C4-5, C5-6, C6-7 spondylosis with stenosis and myelopathy  Postoperative diagnosis: Same  Procedure Name: C4-5, C5-6, C6-7 anterior cervical discectomy with interbody fusion utilizing interbody peek cage, locally harvested autograft, anterior plate instrumentation.  Surgeon:Marilyn Wing A.Finola Rosal, M.D.  Asst. Surgeon: Ellene Route  Anesthesia: General  Indication: 73 year old female with significant neck and bilateral upper extremity symptoms failing conservative management her workup demonstrates evidence of significant spondylosis with severe stenosis at C5-6 and moderately severe foraminal stenosis at both C4-5 and C6-7. Patient presents now for three-level anterior cervical decompression infusion in hopes of improving her symptoms.  Operative note: After induction anesthesia, patient positioned supine with her neck slightly extended and held in place with halter traction. Anterior cervical region prepped and draped. Incision made overlying C5-6. This carried down sharply to the platysma. The platysma was divided vertically and dissection proceeded on the medial border of the sternocleidomastoid muscle and carotid sheath. Trachea and esophagus were mobilized and retracted towards the left. Self-retaining retractor was placed intraoperative fluoroscopy is used levels were confirmed. Anterior osteophytes were removed and bone harvested. Disc space and size at all 3 levels. Discectomies performed using pituitary rongeurs forward and backward angle Carlen curettes Kerrison rongeurs the high-speed drill. All elements of the disc removed down to the level of the posterior annulus. Microscope and brought into the field and used throughout the remainder of the discectomy. Remaining aspects of annulus and osteophytes removed using a high-speed drill down to level posterior longitudinal limb. Posterior longitudinal  limb was elevated and resected so fashion using Kerrison rongeurs. Underlying thecal sac was identified. Wide central decompression and perform undercutting the bodies of C4 and C5. Decompression MCH are of foramen. Wide anterior foraminotomies were performed along the course the exiting C5 nerve roots bilaterally. At this point a very thorough decompression been achieved. There is no evidence of injury to the thecal sac and nerve roots. Procedure then repeated at C5-6 and C6-7 again without complication. Wounds were then irrigated out solution. Surgifoam was used as needed for hemostasis and then this was evacuated. Interbody peek cages were then packed with morselized autograft which been harvested during the case. Each cage was impacted in place and recessed slightly from the anterior cortical margin. Atlantis anterior cervical plate was then placed in the C4, C5, C6, C7 levels. This is an attachment or fluoroscopic guidance using 13 mm fixed angle screws 2 each in all 4 levels. All screws given a final tightening be solidly within bone. Locking screws were engaged at all 4 levels. Final images revealed good position the bone graft and hardware at proper level with normal lamina spine. Wounds that area one final time. Hemostasis was assured with bipolar chart. A medium pneumatic drain was left in the prevertebral space. Wounds and close in layers with Vicryl sutures. Steri-Strips triggers were applied. There were no apparent outpatient. Patient tolerated the procedure well and she returns to the recovery room postop.

## 2013-12-13 NOTE — Evaluation (Signed)
Occupational Therapy Evaluation Patient Details Name: Brittany Figueroa MRN: [id number] DOB: [birth year] Today's Date: 12/13/2013    History of Present Illness 73 y.o. s/p C4-5, C5-6, C6-7 anterior cervical discectomy with interbody fusion utilizing interbody peek cage, locally harvested autograft, anterior plate instrumentation.   Clinical Impression   Pt s/p above. Pt moving well during session. Pt independent with ADLs, PTA. Feel pt will benefit from acute OT to increase independence prior to d/c. Pt would like to review information tomorrow prior to d/c.    Follow Up Recommendations  No OT follow up;Supervision - Intermittent    Equipment Recommendations  None recommended by OT    Recommendations for Other Services       Precautions / Restrictions Precautions Precautions: Cervical Precaution Comments: educated on precautions Required Braces or Orthoses: Cervical Brace Cervical Brace: Soft collar Restrictions Weight Bearing Restrictions: No      Mobility Bed Mobility Overal bed mobility: Needs Assistance Bed Mobility: Rolling;Sidelying to Sit;Sit to Sidelying Rolling: Min guard Sidelying to sit: Min guard     Sit to sidelying: Min guard General bed mobility comments: cues for log roll technique.  Transfers Overall transfer level: Needs assistance   Transfers: Sit to/from Stand Sit to Stand: Supervision              Balance                                            ADL Overall ADL's : Needs assistance/impaired     Grooming: Supervision/safety;Standing               Lower Body Dressing: Supervision/safety;Sit to/from stand   Toilet Transfer: Supervision/safety;Ambulation;Regular Toilet   Toileting- Water quality scientist and Hygiene: Supervision/safety;Sit to/from stand;Sitting/lateral lean   Tub/ Shower Transfer: Min guard;Ambulation (practiced stepping over)   Functional mobility during ADLs: Supervision/safety General ADL  Comments: Practiced simulated tub transfer and recommended someone being with her for this. Educated on safety tips for home (rugs, safe shoewear, sitting for most of LB ADLs). Educated on technique of crossing legs for LB ADLs and pt able to do so with cues for precautions.  Educated on neck brace. Talked about button up shirts to help maintain precautions. Educated on use of cup/straw to help maintain precautions. Educated on Secondary school teacher.     Vision                     Perception     Praxis      Pertinent Vitals/Pain Pain 6-7/10. Repositioned.      Hand Dominance     Extremity/Trunk Assessment Upper Extremity Assessment Upper Extremity Assessment: Overall WFL for tasks assessed   Lower Extremity Assessment Lower Extremity Assessment: Defer to PT evaluation       Communication Communication Communication: No difficulties   Cognition Arousal/Alertness: Awake/alert Behavior During Therapy: WFL for tasks assessed/performed Overall Cognitive Status: Within Functional Limits for tasks assessed                     General Comments       Exercises       Shoulder Instructions      Home Living Family/patient expects to be discharged to:: Private residence Living Arrangements: Alone Available Help at Discharge: Friend(s);Available PRN/intermittently Type of Home:  (townhome) Home Access: Stairs to enter [employer] of Steps: 3 Entrance Stairs-Rails: None Home  Layout: Two level Alternate Level Stairs-Number of Steps: 13 Alternate Level Stairs-Rails: Left Bathroom Shower/Tub: Teacher, early years/pre: Handicapped height     Home Equipment: Adaptive equipment (access to shower chair and 3 in 1) Adaptive Equipment: Reacher        Prior Functioning/Environment Level of Independence: Independent             OT Diagnosis: Acute pain   OT Problem List: Decreased knowledge of use of DME or AE;Decreased knowledge of  precautions;Pain;Decreased range of motion   OT Treatment/Interventions: Self-care/ADL training;DME and/or AE instruction;Therapeutic activities;Patient/family education;Balance training    OT Goals(Current goals can be found in the care plan section) Acute Rehab OT Goals Patient Stated Goal: not stated OT Goal Formulation: With patient Time For Goal Achievement: 12/20/13 Potential to Achieve Goals: Good ADL Goals Pt Will Perform Lower Body Dressing: with modified independence;sit to/from stand Pt Will Transfer to Toilet: with modified independence;ambulating (elevated toilet) Pt Will Perform Toileting - Clothing Manipulation and hygiene: with modified independence;sit to/from stand Additional ADL Goal #1: Pt will independently perform bed mobility as precursor for ADLs.    OT Frequency: Min 2X/week   Barriers to D/C:            Co-evaluation              End of Session Equipment Utilized During Treatment: Cervical collar Nurse Communication: Other (comment) (told tech drain leaking)  Activity Tolerance: Patient tolerated treatment well Patient left: in bed;with call bell/phone within reach;with family/visitor present;with nursing/sitter in room   Time: 1525-1600 OT Time Calculation (min): 35 min Charges:  OT General Charges $OT Visit: 1 Procedure OT Evaluation $Initial OT Evaluation Tier I: 1 Procedure OT Treatments $Self Care/Home Management : 8-22 mins G-CodesBenito Mccreedy OTR/L [phone] 12/13/2013, 5:05 PM

## 2013-12-14 ENCOUNTER — Encounter (HOSPITAL_COMMUNITY): Payer: Self-pay | Admitting: Neurosurgery

## 2013-12-14 MED ORDER — CYCLOBENZAPRINE HCL 10 MG PO TABS: 10.0000 mg | ORAL_TABLET | Freq: Three times a day (TID) | ORAL | Status: DC | PRN

## 2013-12-14 MED ORDER — HYDROCODONE-ACETAMINOPHEN 5-325 MG PO TABS: 1.0000 | ORAL_TABLET | ORAL | Status: DC | PRN

## 2013-12-14 NOTE — Discharge Instructions (Signed)
Wound Care Keep incision covered and dry for two days.  If you shower, cover incision with plastic wrap.  Do not put any creams, lotions, or ointments on incision. Leave steri-strips on back.  They will fall off by themselves. Activity Walk each and every day, increasing distance each day. No lifting greater than 5 lbs.  Avoid excessive neck motion. No driving for 2 weeks; may ride as a passenger locally. If provided with back brace, wear when out of bed.  It is not necessary to wear brace in bed. Diet Resume your normal diet.  Return to Work Will be discussed at you follow up appointment. Call Your Doctor If Any of These Occur Redness, drainage, or swelling at the wound.  Temperature greater than 101 degrees. Severe pain not relieved by pain medication. Incision starts to come apart. Follow Up Appt Call today for appointment in 1-2 weeks ([phone]) or for problems.  If you have any hardware placed in your spine, you will need an x-ray before your appointment.   Wound Care Keep incision area dry.  You may remove outer bandage after 2 days and shower.   If you shower prior cover incision with plastic wrap.  Do not put any creams, lotions, or ointments on incision. Leave steri-strips on neck.  They will fall off by themselves. Activity Walk each and every day, increasing distance each day. No lifting greater than 5 lbs.  Avoid excessive neck motion. No driving for 2 weeks; may ride as a passenger locally. Wear neck brace at all times except when showering. Diet Resume your normal diet.  Return to Work Will be discussed at you follow up appointment. Call Your Doctor If Any of These Occur Redness, drainage, or swelling at the wound.  Temperature greater than 101 degrees. Severe pain not relieved by pain medication. Increased difficulty swallowing.  Incision starts to come apart. Follow Up Appt Call today and ask for Brittany Figueroa for appointment in 1-2 weeks [phone]) or for  problems.  If you have any hardware placed in your spine, you will need an x-ray before your appointment.    Anterior Cervical Diskectomy and Fusion Anterior cervical diskectomy is surgery done on the upper spine to relieve pressure on one or more nerve roots, or on the spinal cord. There are 7 bones in your neck, called the cervical spine. These 7 bones (vertebrae) sit one on top of the other. Cushions (intervertebral disks) separate the vertebrae and act like shock absorbers. As we age, degeneration of our bones, joints, and disks can cause neck pain and tightening around the spinal cord and nerve roots. This causes arm pain and weakness.  Degeneration involves:  Herniated Disk. With age, the disks dry up and can rupture. In this condition, the center of the disk bulges out (disk herniation). This can cause pressure on a nerve, which produces pain or weakness in the arm.  Bone spurs and spinal stenosis. As we age, growths often develop on our bones. These growths are called bone spurs (osteophytes). A bone spur is a collection of calcium. As bone spurs grow and extend, the vertebral openings become narrow. The spinal canal and/or the foramen (opening for nerve passageways) become smaller. This narrowing (stenosis) may cause pinching (compression) of the spinal cord or the spinal nerve root. The nerve injury can cause pain, weakness, numbness, and loss of coordination in the upper limbs. Often, patients have difficulty with their hand writing or they start dropping things, because their hand grip is weaker. The  spinal cord damage can cause increased stiffness, more frequent falls, electric shooting pain, and changes in bowel and bladder control. Degeneration in the neck results in three common problems:  Radiculopathy - Nerve compression that results in weakness or pain that radiates down the arm.  Myelopathy - Spinal cord compression that causes stiffness, difficulty with walking, coordination, and  trouble with bowel or bladder habits.  Neck pain - Worn out joints cause pain as the neck moves. Treatment:  Radiculopathy - Surgery is performed to remove the bony and disk material that is pushing on the nerve.  Myelopathy - Surgery is performed to remove the bony and disk material that pushes on the spinal cord.  Neck pain - Surgery is performed to combine (fuse) the joints of the neck together, so they cannot move or cause pain. Surgery can be done from the front or the back of the neck. When it is done from the front, it is called an anterior (front) cervical (neck) diskectomy (removal of the disk) and fusion. LET YOUR CAREGIVER KNOW ABOUT:   Recent infections.  Any shooting pains down your leg, when you move your neck.  Any difficulty swallowing.  A smoking history.  Use of blood thinners or anti-inflammatory medicines.  Any history of injury to your shoulders.  Any history of injury to your vocal cords.  Any foreign objects in your body from a previous surgery.  Any recent fevers or illness.  Past medical history (diabetes, strokes).  Past problems with anesthetics.  Possibility of pregnancy.  History of blood clots (deep vein thrombosis).  History of bleeding or blood problems.  Past surgeries.  Other health problems.  Allergies.  Medicines you take, including herbs, eye drops, over-the-counter medicines, and creams.  Use of steroids (by mouth or creams). RISKS AND COMPLICATIONS  Infection.  Bleeding.  Injury to the following structures:  Carotid artery. This can result in a stroke or significant amount of bleeding.  Esophagus, resulting in difficulty swallowing.  Recurrent laryngeal nerve, resulting in hoarseness of the voice.  Spinal cord injury, ranging from mild to complete quadriparesis (muscle weakness in all four limbs).  Nerve root injury, resulting in muscle weakness in the upper limb.  Leakage of cerebrospinal fluid. BEFORE THE  PROCEDURE   You will be given medicine to help you sleep (general anesthetic), and a breathing tube will be placed.  You will be given antibiotics to keep the infection rate down.  The incision site on your neck will be marked.  Your neck will be cleaned, to reduce the risk of infection. PROCEDURE  An anterior cervical fusion means that the operation is done through the front (anterior) part of your neck. The cut made by the surgeon (incision) is usually within a skin fold line on the neck. After pushing aside the neck muscles, the surgeon removes the affected, degenerated disk and bone spurs (osteophytes), which takes the pressure off the nerves and spinal cord. This is called a decompression. The area where the disk was removed is then filled with a small piece of plastic. This plastic takes the place of the disk and keeps the nerve passageway (foramen) open and clear for the nerves. In most cases, the surgeon uses metal plates or pins (hardware) in the neck, to help stabilize the level being fused. The hardware reduces motion at that level, so it can fuse. This provides extra support to the neck. A cervical fusion procedure takes anywhere from a couple to several hours, depending on the size  of the neck, history of previous surgery, and number of levels being fused. AFTER THE PROCEDURE   You will likely spend 24-48 hours in the hospital. During this time, your caregivers will look for any signs of complications from the procedure.  Your caregiver will watch you, to make sure that fluid draining from the surgery slows down. It is important that a large mass of blood does not form in your neck, which would cause difficulty with breathing.  You will get 24 hours of antibiotics.  You can start to eat as soon as you feel comfortable.  Once you have started eating, walking, urinating (voiding) and having bowel movements on your own, your caregiver will discharge you home. HOME CARE INSTRUCTIONS    For 2 weeks, do not soak the incision site under water. Do not swim or take baths. Showers are okay, but rinse off the incision sites.  Do not over exert yourself. Allow time for the incision to heal.  It can take from 6 weeks to 6 months for fusion to take effect. Your caregiver may ask you to wear a neck collar during this time, as they check the fusion with multiple (serial) X-rays. Document Released: 04/23/2009 Document Revised: 08/30/2012 Document Reviewed: 04/23/2009 [facility] Patient Information 2015 [place], [place]. This information is not intended to replace advice given to you by your health care provider. Make sure you discuss any questions you have with your health care provider.

## 2013-12-14 NOTE — Evaluation (Signed)
Physical Therapy Evaluation Patient Details Name: Brittany Figueroa MRN: [id number] DOB: 06-06-1940 Today's Date: 12/14/2013   History of Present Illness  73 y.o. s/p C4-5, C5-6, C6-7 anterior cervical discectomy with interbody fusion utilizing interbody peek cage, locally harvested autograft, anterior plate instrumentation.  Clinical Impression   Patient evaluated by Physical Therapy with no further acute PT needs identified. All education has been completed and the patient has no further questions.  See below for any follow-up Physical Therapy or equipment needs. PT is signing off. Thank you for this referral.     Follow Up Recommendations No PT follow up (The potential need for Outpatient PT can be addressed at Neurosurg follow-up appointments. )    Equipment Recommendations  None recommended by PT    Recommendations for Other Services       Precautions / Restrictions Precautions Precautions: Cervical Precaution Comments: educated on precautions Required Braces or Orthoses: Cervical Brace Cervical Brace: Soft collar      Mobility  Bed Mobility Overal bed mobility: Needs Assistance Bed Mobility: Rolling;Sidelying to Sit Rolling: Modified independent (Device/Increase time) Sidelying to sit: Modified independent (Device/Increase time)       General bed mobility comments: Performed log roll technqiue well without cueing  Transfers Overall transfer level: Modified independent Equipment used: None Transfers: Sit to/from Stand Sit to Stand: Modified independent (Device/Increase time)         General transfer comment: No difficulty noted  Ambulation/Gait Ambulation/Gait assistance: Supervision;Modified independent (Device/Increase time) Ambulation Distance (Feet): 300 Feet Assistive device: None Gait Pattern/deviations: Step-through pattern;Decreased stride length     General Gait Details: Progressed from Supervision to modified independence; Discussed possibility of  RW, however pt does not need it, and it would possibly get in her way  Stairs Stairs: Yes Stairs assistance: Min guard Stair Management: One rail Left;Alternating pattern;Forwards Number of Stairs: 12 General stair comments: Cues for feeling next step with her feet as opposed to looking down; Cued also to take steps a bit slower, and have someone near for first few times up and down  Wheelchair Mobility    Modified Rankin (Stroke Patients Only)       Balance                                             Pertinent Vitals/Pain 3/10 neck pain; reports is much improved from preop patient repositioned for comfort     Home Living Family/patient expects to be discharged to:: Private residence Living Arrangements: Alone Available Help at Discharge: Friend(s);Available PRN/intermittently Type of Home: House Home Access: Stairs to enter Entrance Stairs-Rails: None Entrance Stairs-Number of Steps: 3 Home Layout: Two level Home Equipment: Adaptive equipment (access to shower chair and 3 in 1)      Prior Function Level of Independence: Independent               Hand Dominance        Extremity/Trunk Assessment   Upper Extremity Assessment: Defer to OT evaluation           Lower Extremity Assessment: Overall WFL for tasks assessed         Communication   Communication: No difficulties  Cognition Arousal/Alertness: Awake/alert Behavior During Therapy: WFL for tasks assessed/performed Overall Cognitive Status: Within Functional Limits for tasks assessed  General Comments      Exercises        Assessment/Plan    PT Assessment Patent does not need any further PT services  PT Diagnosis     PT Problem List    PT Treatment Interventions     PT Goals (Current goals can be found in the Care Plan section) Acute Rehab PT Goals Patient Stated Goal: [place] PT Goal Formulation: No goals set, d/c  therapy    Frequency     Barriers to discharge        Co-evaluation               End of Session Equipment Utilized During Treatment: Cervical collar Activity Tolerance: Patient tolerated treatment well Patient left: in chair;with call bell/phone within reach;with family/visitor present Nurse Communication: Mobility status         Time: 0900-0920 PT Time Calculation (min): 20 min   Charges:   PT Evaluation $Initial PT Evaluation Tier I: 1 Procedure PT Treatments $Gait Training: 8-22 mins   PT G Codes:          Quin Hoop 12/14/2013, 9:48 AM  Roney Marion, [place] Pager [phone] Office [phone]

## 2013-12-14 NOTE — Discharge Summary (Signed)
Physician Discharge Summary  Patient ID: Brittany Figueroa MRN: [id number] DOB/AGE: 1940-12-27 73 y.o.  Admit date: 12/13/2013 Discharge date: 12/14/2013  Admission Diagnoses:  Discharge Diagnoses:  Principal Problem:   Spondylosis, cervical, with myelopathy Active Problems:   Cervical spondylosis with myelopathy   Discharged Condition: good  Hospital Course: The patient was admitted to the hospital where she underwent an uncomplicated three-level anterior cervical decompression and fusion. Postoperatively she is doing well. Preoperative neck and upper extremity pain much improved. Patient up ambulating without difficulty. Ready for discharge home.  Consults:   Significant Diagnostic Studies:   Treatments:   Discharge Exam: Blood pressure 149/77, pulse 73, temperature 98.6 F (37 C), temperature source Oral, resp. rate 18, weight 62.143 kg (137 lb), SpO2 95.00%. Awake and alert. Oriented and appropriate. Cranial nerve function intact. Motor and sensory function extremities normal. Wound clean and dry. Chest and abdomen benign.  Disposition: 01-Home or Self Care     Medication List         acetaminophen 500 MG tablet  Commonly known as:  TYLENOL  Take 500 mg by mouth every 6 (six) hours as needed for mild pain or headache.     aspirin 81 MG tablet  Take 81 mg by mouth daily.     calcium-vitamin D 500-200 MG-UNIT per tablet  Commonly known as:  OSCAL WITH D  Take 2 tablets by mouth daily.     carvedilol 6.25 MG tablet  Commonly known as:  COREG  Take 6.25 mg by mouth 2 (two) times daily with a meal.     clopidogrel 75 MG tablet  Commonly known as:  PLAVIX  Take 1 tablet (75 mg total) by mouth daily.     cyclobenzaprine 10 MG tablet  Commonly known as:  FLEXERIL  Take 1 tablet (10 mg total) by mouth 3 (three) times daily as needed for muscle spasms.     diclofenac sodium 1 % Gel  Commonly known as:  VOLTAREN  Apply 2 g topically daily as needed. For pain     ezetimibe 10 MG tablet  Commonly known as:  ZETIA  Take 1 tablet (10 mg total) by mouth daily.     fish oil-omega-3 fatty acids 1000 MG capsule  Take 1 g by mouth daily.     HYDROcodone-acetaminophen 5-325 MG per tablet  Commonly known as:  NORCO/VICODIN  Take 1-2 tablets by mouth every 4 (four) hours as needed for moderate pain.     irbesartan 150 MG tablet  Commonly known as:  AVAPRO  Take 150 mg by mouth daily before breakfast.     multivitamin with minerals Tabs tablet  Take 1 tablet by mouth daily.     nitroGLYCERIN 0.4 MG SL tablet  Commonly known as:  NITROSTAT  Place 0.4 mg under the tongue every 5 (five) minutes as needed for chest pain.     OVER THE COUNTER MEDICATION  Take 1 tablet by mouth as needed (for indigestion). Bayer Brand Fruit Chew gummies     Pitavastatin Calcium 2 MG Tabs  Commonly known as:  LIVALO  Take 0.5 tablets (1 mg total) by mouth daily.     Vitamin D3 2000 UNITS Tabs  Take 2,000 Units by mouth 2 (two) times daily.         Signed: Odessa Morren A 12/14/2013, 9:31 AM

## 2013-12-14 NOTE — Progress Notes (Signed)
Pt and family given D/C instructions with Rx's, verbal understanding was provided. Pt's IV and Hemovac was removed prior to D/C. Pt D/C'd home via wheelchair @ 1035 per MD order. Pt was stable @ D/C and has no other needs. Holli Humbles, RN

## 2013-12-15 ENCOUNTER — Ambulatory Visit: Payer: Medicare Other | Admitting: Pharmacist

## 2013-12-15 NOTE — Telephone Encounter (Signed)
Pharmacy has been notified that Rx was approved. Patient picked up on 7/27 per pharmacy.

## 2013-12-22 DIAGNOSIS — M4712 Other spondylosis with myelopathy, cervical region: Secondary | ICD-10-CM | POA: Diagnosis not present

## 2014-01-11 DIAGNOSIS — M4712 Other spondylosis with myelopathy, cervical region: Secondary | ICD-10-CM | POA: Diagnosis not present

## 2014-02-08 DIAGNOSIS — M899 Disorder of bone, unspecified: Secondary | ICD-10-CM | POA: Diagnosis not present

## 2014-02-08 DIAGNOSIS — I10 Essential (primary) hypertension: Secondary | ICD-10-CM | POA: Diagnosis not present

## 2014-02-08 DIAGNOSIS — E1129 Type 2 diabetes mellitus with other diabetic kidney complication: Secondary | ICD-10-CM | POA: Diagnosis not present

## 2014-02-08 DIAGNOSIS — M949 Disorder of cartilage, unspecified: Secondary | ICD-10-CM | POA: Diagnosis not present

## 2014-02-08 DIAGNOSIS — M4712 Other spondylosis with myelopathy, cervical region: Secondary | ICD-10-CM | POA: Diagnosis not present

## 2014-02-13 DIAGNOSIS — I10 Essential (primary) hypertension: Secondary | ICD-10-CM | POA: Diagnosis not present

## 2014-02-13 DIAGNOSIS — E785 Hyperlipidemia, unspecified: Secondary | ICD-10-CM | POA: Diagnosis not present

## 2014-02-13 DIAGNOSIS — I2510 Atherosclerotic heart disease of native coronary artery without angina pectoris: Secondary | ICD-10-CM | POA: Diagnosis not present

## 2014-02-13 DIAGNOSIS — E1129 Type 2 diabetes mellitus with other diabetic kidney complication: Secondary | ICD-10-CM | POA: Diagnosis not present

## 2014-02-28 DIAGNOSIS — H35361 Drusen (degenerative) of macula, right eye: Secondary | ICD-10-CM | POA: Diagnosis not present

## 2014-02-28 DIAGNOSIS — L650 Telogen effluvium: Secondary | ICD-10-CM | POA: Diagnosis not present

## 2014-03-23 DIAGNOSIS — M4712 Other spondylosis with myelopathy, cervical region: Secondary | ICD-10-CM | POA: Diagnosis not present

## 2014-03-23 DIAGNOSIS — I10 Essential (primary) hypertension: Secondary | ICD-10-CM | POA: Diagnosis not present

## 2014-03-23 DIAGNOSIS — Z6826 Body mass index (BMI) 26.0-26.9, adult: Secondary | ICD-10-CM | POA: Diagnosis not present

## 2014-04-04 ENCOUNTER — Other Ambulatory Visit (INDEPENDENT_AMBULATORY_CARE_PROVIDER_SITE_OTHER): Payer: Medicare Other | Admitting: *Deleted

## 2014-04-04 DIAGNOSIS — E785 Hyperlipidemia, unspecified: Secondary | ICD-10-CM

## 2014-04-04 DIAGNOSIS — Z79899 Other long term (current) drug therapy: Secondary | ICD-10-CM

## 2014-04-05 LAB — LIPID PANEL
Cholesterol: 217 mg/dL — ABNORMAL HIGH (ref 0–200)
HDL: 49.8 mg/dL (ref >39.00)
NonHDL: 167.2
Total CHOL/HDL Ratio: 4
Triglycerides: 211 mg/dL — ABNORMAL HIGH (ref 0.0–149.0)
VLDL: 42.2 mg/dL — ABNORMAL HIGH (ref 0.0–40.0)

## 2014-04-05 LAB — HEPATIC FUNCTION PANEL
ALT: 33 U/L (ref 0–35)
AST: 27 U/L (ref 0–37)
Albumin: 3.9 g/dL (ref 3.5–5.2)
Alkaline Phosphatase: 72 U/L (ref 39–117)
Bilirubin, Direct: 0 mg/dL (ref 0.0–0.3)
Total Bilirubin: 0.7 mg/dL (ref 0.2–1.2)
Total Protein: 6.8 g/dL (ref 6.0–8.3)

## 2014-04-05 LAB — LDL CHOLESTEROL, DIRECT: Direct LDL: 130.1 mg/dL

## 2014-04-06 ENCOUNTER — Ambulatory Visit (INDEPENDENT_AMBULATORY_CARE_PROVIDER_SITE_OTHER): Payer: Medicare Other | Admitting: Pharmacist Clinician (PhC)/ Clinical Pharmacy Specialist

## 2014-04-06 ENCOUNTER — Encounter: Payer: Self-pay | Admitting: Pharmacist Clinician (PhC)/ Clinical Pharmacy Specialist

## 2014-04-06 VITALS — Ht 61.75 in | Wt 142.0 lb

## 2014-04-06 DIAGNOSIS — E785 Hyperlipidemia, unspecified: Secondary | ICD-10-CM | POA: Diagnosis not present

## 2014-04-06 DIAGNOSIS — I2510 Atherosclerotic heart disease of native coronary artery without angina pectoris: Secondary | ICD-10-CM

## 2014-04-06 NOTE — Progress Notes (Signed)
Patient is a 73 y.o. WF referred to lipid clinic by Dr. Gwenlyn Found given h/o CAD (s/p PCI of LAD in 2013) and h/o statin intolerance.  She has been taking Livalo 1mg  and Zetia 5mg  daily with only occasional soreness and 1 episode of memory problem.  Her last LDL on this therapy was 142.  She has also been getting her labs drawn at [facility] every 3 months, usually about half way in between our labs.  Interestingly her cLDL at [facility] has run between 105-115 in 2015.  With our labs, in 2015, her cLDL was 142 and 162 and a direct LDL this week was 130.1.  She brings in a copy with her last 5 labs at [facility] today.  Patient has failed multiple statins in the past due to muscle soreness and arm/hands going to sleep.  Most recently stopped Crestor due to LFT elevation which resolved following patient stopping Crestor.  Patient failed Crestor years ago initially due to muscle soreness, but was restarted on this 04/2012 following PCI, and tolerated it pretty well for the past year.  LFTs were normal while on Crestor until recently when ALT went up 196 U/L.  Patient had an abdominal U/S which showed it could possibly be fatty liver, and also saw Dr Silvio Pate.  Hepatis markers were negative.   After stopping Crestor for a few weeks, LFTs returned to normal.  LFTs normal today on Livalo / Zetia.  She tried a holistic approach with CholestOff/fiber/fish oil, but this didn't appear to improve cholesterol per patient.  She has multiple brothers and sisters who have elevated cholesterol, heart disease, and diabetes.  She tells me that when she was around 73 y.o., her LDL went up significantly, which was around the time of menopause.  Has tried multiple lipid lowering medications since then.  Patient states she had her diet reviewed by cardiac rehab about 2 years ago and was given an "A+" for her eating habits.    RF:  CAD, insulin resistance, HTN, age - LDL goal < 70, non-HDL goal < 100 Meds:  Zetia 10 mg qd, Livalo 1 mg  qd Intolerant:  Lipitor, Zocor, Crestor all caused muscle aches.  Crestor 5 mg qd appears to have caused elevated LFTs (ALT 196 U/L) - they resolved after stopping Crestor for a few weeks.  Labs: 03/2014:  LDL 130.1, TC 217, TG 211, HDL 49.8, LFTs normal (our lab) - Livalo 1 mg qd + Zetia 5 mg qd 11/2013:  LDL 142, TC 212, TG 81, HDL 54, LFTs normal (our lab) - Livalo 1 mg qd + Zetia 10 mg q 10/2013:  LDL was 111 mg/dL at [facility] (Livalo 1 mg + Zetia 10 mg qd) 09/2013:  TC 275, LDL 192, HDL 46, TG 183, ALT 42, AST 27 (Zetia 10 mg qd only) 08/2013:  ALT 196, AST 41 (Crestor 5 mg qd, Zetia 10 mg qd) -- LDL historically ~ 90 mg/dL on Crestor 5 mg qd + Zetia 10 mg qd  Current Outpatient Prescriptions  Medication Sig Dispense Refill  . acetaminophen (TYLENOL) 500 MG tablet Take 500 mg by mouth every 6 (six) hours as needed for mild pain or headache.    Marland Kitchen aspirin 81 MG tablet Take 81 mg by mouth daily.    . calcium-vitamin D (OSCAL WITH D) 500-200 MG-UNIT per tablet Take 2 tablets by mouth daily.    . carvedilol (COREG) 6.25 MG tablet Take 6.25 mg by mouth 2 (two) times daily with a  meal.     . Cholecalciferol (VITAMIN D3) 2000 UNITS TABS Take 2,000 Units by mouth 2 (two) times daily.    . clopidogrel (PLAVIX) 75 MG tablet Take 1 tablet (75 mg total) by mouth daily. 90 tablet 3  . cyclobenzaprine (FLEXERIL) 10 MG tablet Take 1 tablet (10 mg total) by mouth 3 (three) times daily as needed for muscle spasms. 30 tablet 0  . diclofenac sodium (VOLTAREN) 1 % GEL Apply 2 g topically daily as needed. For pain    . ezetimibe (ZETIA) 10 MG tablet Take 1 tablet (10 mg total) by mouth daily. 14 tablet 0  . fish oil-omega-3 fatty acids 1000 MG capsule Take 1 g by mouth daily.     Marland Kitchen HYDROcodone-acetaminophen (NORCO/VICODIN) 5-325 MG per tablet Take 1-2 tablets by mouth every 4 (four) hours as needed for moderate pain. 60 tablet 0  . irbesartan (AVAPRO) 150 MG tablet Take 150 mg by mouth daily  before breakfast.     . Multiple Vitamin (MULTIVITAMIN WITH MINERALS) TABS Take 1 tablet by mouth daily.    . nitroGLYCERIN (NITROSTAT) 0.4 MG SL tablet Place 0.4 mg under the tongue every 5 (five) minutes as needed for chest pain.    Marland Kitchen OVER THE COUNTER MEDICATION Take 1 tablet by mouth as needed (for indigestion). [employer] gummies    . Pitavastatin Calcium (LIVALO) 2 MG TABS Take 0.5 tablets (1 mg total) by mouth daily. 30 tablet 6   No current facility-administered medications for this visit.   Allergies  Allergen Reactions  . Statins Other (See Comments)    "intermittent loss of circulation; hands and arms will go to sleep; feet will get cramps; fatigue" (05/13/2012).  This occurred with Lipitor, Zocor, Crestor 5 mg qd, and she thinks pravastatin as well   Family History  Problem Relation Age of Onset  . Hypertension Mother   . Diabetes Mother   . Hyperlipidemia Father   . Heart disease Father   . Stroke Father   . Heart disease Sister   . Diabetes Sister   . Heart disease Brother

## 2014-04-06 NOTE — Assessment & Plan Note (Signed)
Today there is just a slight improvement in her LDL from 142 to 130.  She did cut back on the Zetia due to muscle concerns since her last visit, but all other meds are same.  We discussed the option of PCSK-9 inhibitors, marketed products and SPIRE trial.  Also reviewed the issues related to cost, side effects and ability to get coverage.  She will be switching insurance in January.  For now she would just like to continue with current regimen and repeat labs in 4 months.  At that time we can make a decision about further treatment.  She also stated that she is going to get back on her fish oil therapy as well.

## 2014-04-06 NOTE — Patient Instructions (Addendum)
Continue with Zetia 5 mg and Livalo 1 mg daily  Restart [employer] fish oil  Recheck cholesterol in 6 months and see [place] at lipid clinic several days later - lab [facility] March 21 at 8 am,  Office visit March 24 at 8:30 am

## 2014-04-17 ENCOUNTER — Ambulatory Visit (INDEPENDENT_AMBULATORY_CARE_PROVIDER_SITE_OTHER): Payer: Medicare Other | Admitting: *Deleted

## 2014-04-17 ENCOUNTER — Ambulatory Visit (INDEPENDENT_AMBULATORY_CARE_PROVIDER_SITE_OTHER): Payer: Medicare Other | Admitting: Cardiology

## 2014-04-17 ENCOUNTER — Encounter: Payer: Self-pay | Admitting: Cardiology

## 2014-04-17 VITALS — BP 164/84 | HR 62 | Ht 61.0 in | Wt 142.5 lb

## 2014-04-17 DIAGNOSIS — E785 Hyperlipidemia, unspecified: Secondary | ICD-10-CM

## 2014-04-17 DIAGNOSIS — Z95820 Peripheral vascular angioplasty status with implants and grafts: Secondary | ICD-10-CM

## 2014-04-17 DIAGNOSIS — Z23 Encounter for immunization: Secondary | ICD-10-CM | POA: Diagnosis not present

## 2014-04-17 DIAGNOSIS — I10 Essential (primary) hypertension: Secondary | ICD-10-CM

## 2014-04-17 DIAGNOSIS — I2510 Atherosclerotic heart disease of native coronary artery without angina pectoris: Secondary | ICD-10-CM

## 2014-04-17 DIAGNOSIS — Z9889 Other specified postprocedural states: Secondary | ICD-10-CM

## 2014-04-17 MED ORDER — EZETIMIBE 10 MG PO TABS: 5.0000 mg | ORAL_TABLET | Freq: Every day | ORAL | Status: DC

## 2014-04-17 MED ORDER — PITAVASTATIN CALCIUM 2 MG PO TABS: 1.0000 mg | ORAL_TABLET | Freq: Every day | ORAL | Status: DC

## 2014-04-17 NOTE — Progress Notes (Signed)
04/17/2014   PCP: Thressa Sheller, MD   Chief Complaint  Patient presents with  . Follow-up    6 month f/u    Primary Cardiologist: Dr. Adora Fridge   HPI:  73 year old mildly overweight divorced Caucasian female, mother to 2, grandmother to 3 grandchildren, who is followed by Dr. Adora Fridge  last seen by him in May 2015.  She has a history of hypertension and hyperlipidemia. In December of 2013 with new anterolateral T-wave inversion and a Myoview that showed inferolateral ischemia that was new compared to a prior study. She did have some unusual symptoms at that time. Based on this, she was catheterized on May 13, 2012, revealing an 80% proximal LAD lesion, which Dr. Adora Fridge  stented using a drug-eluting stent, as well as a total dominant RCA with left to right collaterals and normal LV function. Since stenting her LAD, she has been asymptomatic. Her other problems include hypertension and hyperlipidemia. I have reviewed her blood pressures, which were recorded during cardiac rehab, which were all normal. Her recent blood work revealed a total cholesterol of 161, LDL of 89 and HDL of 40.she denies chest pain or shortness of breath. She had a recent Myoview stress test 09/2013,  performed several months ago that showed ischemia in the RCA territory but otherwise unremarkable is explainable by her known total dominant RCA with left to right collaterals. She did have elevated liver function tests and was evaluated Dr. Fuller Plan. I placed her on a statin holiday which resulted in improvement in her LFTs and worsening of her lipid profile.  She is followed by our pharmacists for her cholesterol and is now tolerating her livalo and zetia.  She will be seen again in March 2016 with lab check.    She underwent ant cervical disk surgery without complications.  She was off the plavix for surgery and has resumed.     She is here for today for follow up, no chest pain, no SOB.  She is asking for  her flu shot.  She has not yet returned to exercise, but will begin walking.    BP is elevated  in the AM, and today.  Though by evening well controlled.  I have asked her to adjust when she takes the coreg to have better control.  She will take at 10-10 instead of 7-7.   Allergies  Allergen Reactions  . Statins Other (See Comments)    "intermittent loss of circulation; hands and arms will go to sleep; feet will get cramps; fatigue" (05/13/2012).  This occurred with Lipitor, Zocor, Crestor 5 mg qd, and she thinks pravastatin as well    Current Outpatient Prescriptions  Medication Sig Dispense Refill  . acetaminophen (TYLENOL) 500 MG tablet Take 500 mg by mouth every 6 (six) hours as needed for mild pain or headache.    Marland Kitchen aspirin 81 MG tablet Take 81 mg by mouth daily.    . calcium-vitamin D (OSCAL WITH D) 500-200 MG-UNIT per tablet Take 2 tablets by mouth daily.    . carvedilol (COREG) 6.25 MG tablet Take 6.25 mg by mouth 2 (two) times daily with a meal.     . Cholecalciferol (VITAMIN D3) 2000 UNITS TABS Take 2,000 Units by mouth 2 (two) times daily.    . clopidogrel (PLAVIX) 75 MG tablet Take 1 tablet (75 mg total) by mouth daily. 90 tablet 3  . diclofenac sodium (VOLTAREN) 1 % GEL Apply 2 g topically  daily as needed. For pain    . ezetimibe (ZETIA) 10 MG tablet Take 0.5 tablets (5 mg total) by mouth daily. Provider instructs to take 0.5 tablet 7 tablet 0  . ezetimibe (ZETIA) 10 MG tablet Take 0.5 tablets (5 mg total) by mouth daily. Provider instructs to take 0.5 tablet 45 tablet 0  . fish oil-omega-3 fatty acids 1000 MG capsule Take 1 g by mouth daily.     Marland Kitchen HYDROcodone-acetaminophen (NORCO/VICODIN) 5-325 MG per tablet Take 1-2 tablets by mouth every 4 (four) hours as needed for moderate pain. 60 tablet 0  . irbesartan (AVAPRO) 150 MG tablet Take 150 mg by mouth daily before breakfast.     . Multiple Vitamin (MULTIVITAMIN WITH MINERALS) TABS Take 1 tablet by mouth daily.    .  nitroGLYCERIN (NITROSTAT) 0.4 MG SL tablet Place 0.4 mg under the tongue every 5 (five) minutes as needed for chest pain.    Marland Kitchen OVER THE COUNTER MEDICATION Take 1 tablet by mouth as needed (for indigestion). [employer] gummies    . Pitavastatin Calcium (LIVALO) 2 MG TABS Take 0.5 tablets (1 mg total) by mouth daily. 45 tablet 2   No current facility-administered medications for this visit.    Past Medical History  Diagnosis Date  . Complication of anesthesia 12/2009    "had endoscopy; larynx went into spasms; stopped breathing for 15 seconds" (05/13/2012)  . Hypercholesteremia   . Hypertension   . GERD (gastroesophageal reflux disease) 2011  . Abnormal finding on EKG, new anterolateral T-wave inversions  05/14/2012  . Abnormal nuclear stress test, with infero-lateral ischemia 05/14/2012  . Atypical angina 05/14/2012  . CAD (coronary artery disease), 05/13/12, with 80% LAD 05/14/2012  . S/P angioplasty with stent, LAD 05/13/12 05/14/2012  . Tuberculosis     test +- [place] [facility] - Dr. [name]- CXR- OK  . Arthritis     "right thumb; all my fingers" (05/13/2012), cervical spondylosis   . [facility] arthritis, thumb, degenerative   . Cancer     basal cell on facial in L temporal region      Past Surgical History  Procedure Laterality Date  . Tonsillectomy and adenoidectomy  1950's  . Inguinal hernia repair  ~ 1966; 07/21/2002    "right; left" (05/13/2012)  . Osteotomy and ulnar shortening  07/21/2002    "right" (05/13/2012)  . Forearm / wrist tendon lesion excision  ?11/2001    "right; did waver to try to get ulnar out of hole that it had cut" (05/13/2012  . Dilation and curettage of uterus  1960's; 1970's; 1980    "probably 3" (05/13/2012)  . Tubal ligation  1980  . Coronary angioplasty with stent placement  05/13/2012    DES to LAD; she has total RCA with left to rt coll. normal LV function done for positive nuc study  . Anterior cervical decomp/discectomy fusion N/A 12/13/2013     Procedure: ANTERIOR CERVICAL DECOMPRESSION/DISCECTOMY FUSION 3 LEVELS Cervical four/five,five/six,six/seven. Anterior cervical disectomy and fusion with peek and plate ;  Surgeon: [name], MD;  Location: [facility];  Service: Neurosurgery;  Laterality: N/A;    RJJ:OACZYSA:YT colds or fevers, mild weight increase Skin:no rashes or ulcers HEENT:no blurred vision, no congestion CV:see HPI PUL:see HPI GI:no diarrhea constipation or melena, no indigestion GU:no hematuria, no dysuria MS:no joint pain, no claudication, healing from ant cervical disc disease.  Neuro:no syncope, no lightheadedness Endo:no diabetes, no thyroid disease  Wt Readings from Last 3 Encounters:  04/17/14  142 lb 8 oz (64.638 kg)  04/06/14 142 lb (64.411 kg)  12/13/13 137 lb (62.143 kg)    PHYSICAL EXAM BP 164/84 mmHg  Pulse 62  Ht 5\' 1"  (1.549 m)  Wt 142 lb 8 oz (64.638 kg)  BMI 26.94 kg/m2 General:Pleasant affect, NAD Skin:Warm and dry, brisk capillary refill HEENT:normocephalic, sclera clear, mucus membranes moist Neck:supple, no JVD, no bruits  Heart:S1S2 RRR without murmur, gallup, rub or click Lungs:clear without rales, rhonchi, or wheezes OJZ:BFMZ, non tender, + BS, do not palpate liver spleen or masses Ext:no lower ext edema, 2+ pedal pulses, 2+ radial pulses Neuro:alert and oriented, MAE, follows commands, + facial symmetry  EKG:SR LAFB, no acute changes. HR 62.   ASSESSMENT AND PLAN CAD (coronary artery disease), 05/13/12, with 80% LAD and totally occluded RCA Stable no chest pain. Stable EKG  S/P angioplasty with stent, LAD 05/13/12 See above note  Hyperlipidemia Followed by pharmacist for lipids. Currently on Livalo without increasing LFTs. To follow up with them for lipids in March 2016  HTN (hypertension) BP elevated today, she tells me it is most AM, but by evening 040 systolic.  Will adjust the timing of her coreg for now and she will monitor.

## 2014-04-17 NOTE — Assessment & Plan Note (Signed)
Stable no chest pain. Stable EKG

## 2014-04-17 NOTE — Assessment & Plan Note (Signed)
See above note

## 2014-04-17 NOTE — Assessment & Plan Note (Signed)
Followed by pharmacist for lipids. Currently on Livalo without increasing LFTs. To follow up with them for lipids in March 2016

## 2014-04-17 NOTE — Assessment & Plan Note (Signed)
BP elevated today, she tells me it is most AM, but by evening 507 systolic.  Will adjust the timing of her coreg for now and she will monitor.

## 2014-04-17 NOTE — Patient Instructions (Signed)
Your physician recommends that you schedule a follow-up appointment in:6 months with Dr Berry 

## 2014-04-27 ENCOUNTER — Encounter (HOSPITAL_COMMUNITY): Payer: Self-pay | Admitting: Cardiovascular Disease

## 2014-05-01 ENCOUNTER — Other Ambulatory Visit: Payer: Self-pay | Admitting: Cardiovascular Disease

## 2014-05-01 NOTE — Telephone Encounter (Signed)
Rx was sent to pharmacy electronically. 

## 2014-05-08 DIAGNOSIS — I10 Essential (primary) hypertension: Secondary | ICD-10-CM | POA: Diagnosis not present

## 2014-05-08 DIAGNOSIS — I2510 Atherosclerotic heart disease of native coronary artery without angina pectoris: Secondary | ICD-10-CM | POA: Diagnosis not present

## 2014-05-08 DIAGNOSIS — E785 Hyperlipidemia, unspecified: Secondary | ICD-10-CM | POA: Diagnosis not present

## 2014-05-08 DIAGNOSIS — E1329 Other specified diabetes mellitus with other diabetic kidney complication: Secondary | ICD-10-CM | POA: Diagnosis not present

## 2014-05-08 DIAGNOSIS — M859 Disorder of bone density and structure, unspecified: Secondary | ICD-10-CM | POA: Diagnosis not present

## 2014-05-15 DIAGNOSIS — E1121 Type 2 diabetes mellitus with diabetic nephropathy: Secondary | ICD-10-CM | POA: Diagnosis not present

## 2014-05-15 DIAGNOSIS — E785 Hyperlipidemia, unspecified: Secondary | ICD-10-CM | POA: Diagnosis not present

## 2014-05-15 DIAGNOSIS — I2510 Atherosclerotic heart disease of native coronary artery without angina pectoris: Secondary | ICD-10-CM | POA: Diagnosis not present

## 2014-05-15 DIAGNOSIS — I10 Essential (primary) hypertension: Secondary | ICD-10-CM | POA: Diagnosis not present

## 2014-05-27 ENCOUNTER — Other Ambulatory Visit: Payer: Self-pay | Admitting: Cardiovascular Disease

## 2014-05-29 NOTE — Telephone Encounter (Signed)
Rx refill sent to patient pharmacy   

## 2014-06-13 DIAGNOSIS — I10 Essential (primary) hypertension: Secondary | ICD-10-CM | POA: Diagnosis not present

## 2014-06-19 DIAGNOSIS — I10 Essential (primary) hypertension: Secondary | ICD-10-CM | POA: Diagnosis not present

## 2014-06-19 DIAGNOSIS — E785 Hyperlipidemia, unspecified: Secondary | ICD-10-CM | POA: Diagnosis not present

## 2014-06-19 DIAGNOSIS — E1121 Type 2 diabetes mellitus with diabetic nephropathy: Secondary | ICD-10-CM | POA: Diagnosis not present

## 2014-06-22 ENCOUNTER — Other Ambulatory Visit: Payer: Self-pay | Admitting: Cardiovascular Disease

## 2014-06-22 NOTE — Telephone Encounter (Signed)
Rx(s) sent to pharmacy electronically.  

## 2014-06-28 DIAGNOSIS — N3946 Mixed incontinence: Secondary | ICD-10-CM | POA: Diagnosis not present

## 2014-06-28 DIAGNOSIS — R350 Frequency of micturition: Secondary | ICD-10-CM | POA: Diagnosis not present

## 2014-06-28 DIAGNOSIS — N393 Stress incontinence (female) (male): Secondary | ICD-10-CM | POA: Diagnosis not present

## 2014-07-10 DIAGNOSIS — L820 Inflamed seborrheic keratosis: Secondary | ICD-10-CM | POA: Diagnosis not present

## 2014-08-07 ENCOUNTER — Other Ambulatory Visit (INDEPENDENT_AMBULATORY_CARE_PROVIDER_SITE_OTHER): Payer: Medicare Other | Admitting: *Deleted

## 2014-08-07 DIAGNOSIS — E785 Hyperlipidemia, unspecified: Secondary | ICD-10-CM

## 2014-08-07 LAB — LIPID PANEL
Cholesterol: 195 mg/dL (ref 0–200)
HDL: 46.3 mg/dL (ref >39.00)
LDL Cholesterol: 116 mg/dL — ABNORMAL HIGH (ref 0–99)
NonHDL: 148.7
Total CHOL/HDL Ratio: 4
Triglycerides: 164 mg/dL — ABNORMAL HIGH (ref 0.0–149.0)
VLDL: 32.8 mg/dL (ref 0.0–40.0)

## 2014-08-07 LAB — HEPATIC FUNCTION PANEL
ALT: 23 U/L (ref 0–35)
AST: 18 U/L (ref 0–37)
Albumin: 3.9 g/dL (ref 3.5–5.2)
Alkaline Phosphatase: 71 U/L (ref 39–117)
Bilirubin, Direct: 0 mg/dL (ref 0.0–0.3)
Total Bilirubin: 0.4 mg/dL (ref 0.2–1.2)
Total Protein: 6.8 g/dL (ref 6.0–8.3)

## 2014-08-10 ENCOUNTER — Encounter: Payer: Self-pay | Admitting: Pharmacist Clinician (PhC)/ Clinical Pharmacy Specialist

## 2014-08-10 ENCOUNTER — Ambulatory Visit (INDEPENDENT_AMBULATORY_CARE_PROVIDER_SITE_OTHER): Payer: Medicare Other | Admitting: Pharmacist Clinician (PhC)/ Clinical Pharmacy Specialist

## 2014-08-10 VITALS — Ht 61.75 in | Wt 147.8 lb

## 2014-08-10 DIAGNOSIS — E785 Hyperlipidemia, unspecified: Secondary | ICD-10-CM | POA: Diagnosis not present

## 2014-08-10 NOTE — Patient Instructions (Addendum)
Continue with Zetia 10 mg daily and Livalo 1 mg daily.  Will have you do an NMR to check lipid particle sizes towards the end of April.  Review with Dr. Gwenlyn Found in May

## 2014-08-10 NOTE — Progress Notes (Signed)
Patient is a 74 y.o. WF referred to lipid clinic by Dr. Gwenlyn Found given h/o CAD (s/p PCI of LAD in 2013) and h/o statin intolerance.  She has been taking Livalo 1mg  and Zetia 5mg  daily with only occasional soreness and episodes of short term memory problems.  It hasn't been enough to stop her from taking the medications.  Patient has failed multiple statins in the past due to muscle soreness and arm/hands going to sleep.  Most recently stopped Crestor due to LFT elevation which resolved following patient stopping Crestor.  Patient failed Crestor years ago initially due to muscle soreness, but was restarted on this 04/2012 following PCI, and tolerated it pretty well for the past year.  LFTs were normal while on Crestor until recently when ALT went up 196 U/L.  Patient had an abdominal U/S which showed it could possibly be fatty liver, and also saw Dr Silvio Pate.  Hepatis markers were negative.   After stopping Crestor for a few weeks, LFTs returned to normal.  LFTs normal today on Livalo / Zetia.  She tried a holistic approach with CholestOff/fiber/fish oil, but this didn't appear to improve cholesterol per patient.  She has multiple brothers and sisters who have elevated cholesterol, heart disease, and diabetes.  She tells me that when she was around 74 y.o., her LDL went up significantly, which was around the time of menopause.  Has tried multiple lipid lowering medications since then.  Patient states she had her diet reviewed by cardiac rehab about 2 years ago and was given an "A+" for her eating habits.    RF:  CAD, insulin resistance, HTN, age - LDL goal < 70, non-HDL goal < 100 Meds:  Zetia 10 mg qd, Livalo 1 mg qd   (will occasionally decrease Zetia to 5 mg for a week at a time if she feels fatigued) Intolerant:  Lipitor, Zocor, Crestor all caused muscle aches.  Crestor 5 mg qd appears to have caused elevated LFTs (ALT 196 U/L) - they resolved after stopping Crestor for a few weeks.  Labs: 07/2014:  LDL 116, TC  195, TG 164, HDL 46.3, LFTs normal (Livavlo 1 mg + Zetia 10mg ) 03/2014:  LDL 130.1, TC 217, TG 211, HDL 49.8, LFTs normal (our lab) - Livalo 1 mg qd + Zetia 5 mg qd 11/2013:  LDL 142, TC 212, TG 81, HDL 54, LFTs normal (our lab) - Livalo 1 mg qd + Zetia 10 mg q 10/2013:  LDL was 111 mg/dL at [facility] (Livalo 1 mg + Zetia 10 mg qd) 09/2013:  TC 275, LDL 192, HDL 46, TG 183, ALT 42, AST 27 (Zetia 10 mg qd only) 08/2013:  ALT 196, AST 41 (Crestor 5 mg qd, Zetia 10 mg qd) -- LDL historically ~ 90 mg/dL on Crestor 5 mg qd + Zetia 10 mg qd  Current Outpatient Prescriptions  Medication Sig Dispense Refill  . acetaminophen (TYLENOL) 500 MG tablet Take 500 mg by mouth every 6 (six) hours as needed for mild pain or headache.    Marland Kitchen aspirin 81 MG tablet Take 81 mg by mouth daily.    . calcium-vitamin D (OSCAL WITH D) 500-200 MG-UNIT per tablet Take 2 tablets by mouth daily.    . carvedilol (COREG) 6.25 MG tablet Take 6.25 mg by mouth 2 (two) times daily with a meal.     . Cholecalciferol (VITAMIN D3) 2000 UNITS TABS Take 2,000 Units by mouth 2 (two) times daily.    . clopidogrel (PLAVIX) 75  MG tablet TAKE 1 TABLET BY MOUTH DAILY 90 tablet 1  . diclofenac sodium (VOLTAREN) 1 % GEL Apply 2 g topically daily as needed. For pain    . ezetimibe (ZETIA) 10 MG tablet Take 0.5 tablets (5 mg total) by mouth daily. Provider instructs to take 0.5 tablet 7 tablet 0  . ezetimibe (ZETIA) 10 MG tablet Take 0.5 tablets (5 mg total) by mouth daily. Provider instructs to take 0.5 tablet 45 tablet 0  . fish oil-omega-3 fatty acids 1000 MG capsule Take 1 g by mouth daily.     Marland Kitchen HYDROcodone-acetaminophen (NORCO/VICODIN) 5-325 MG per tablet Take 1-2 tablets by mouth every 4 (four) hours as needed for moderate pain. 60 tablet 0  . irbesartan (AVAPRO) 150 MG tablet Take 150 mg by mouth daily before breakfast.     . irbesartan (AVAPRO) 150 MG tablet TAKE 1 TABLET BY MOUTH EVERY DAY 30 tablet 5  . Multiple Vitamin  (MULTIVITAMIN WITH MINERALS) TABS Take 1 tablet by mouth daily.    . nitroGLYCERIN (NITROSTAT) 0.4 MG SL tablet Place 0.4 mg under the tongue every 5 (five) minutes as needed for chest pain.    Marland Kitchen NITROSTAT 0.4 MG SL tablet PLACE 1 TABLET UNDER THE TONGUE EVERY 5 MINTUES AS NEEDED FOR CHEST PAIN 25 tablet 2  . OVER THE COUNTER MEDICATION Take 1 tablet by mouth as needed (for indigestion). [employer] gummies    . Pitavastatin Calcium (LIVALO) 2 MG TABS Take 0.5 tablets (1 mg total) by mouth daily. 45 tablet 2   No current facility-administered medications for this visit.   Allergies  Allergen Reactions  . Statins Other (See Comments)    "intermittent loss of circulation; hands and arms will go to sleep; feet will get cramps; fatigue" (05/13/2012).  This occurred with Lipitor, Zocor, Crestor 5 mg qd, and she thinks pravastatin as well   Family History  Problem Relation Age of Onset  . Hypertension Mother   . Diabetes Mother   . Hyperlipidemia Father   . Heart disease Father   . Stroke Father   . Heart disease Sister   . Diabetes Sister   . Heart disease Brother

## 2014-08-10 NOTE — Assessment & Plan Note (Signed)
Pt is still somewhat resistant to starting PCSK-9.  She doesn't want to change any medications until she has an NMR and knows more about the LDL particle size.  We discussed the option of enrolling in a study at the [facility], but she is afraid that once the study ends she will be back to where she started.  She continues to take the Zetia 1/2-1 tablet daily, decreasing dose when she feels fatigued.  She tried increasing the Livalo to 2 mg, took it for about 1 week, but then decreased back to 1 mg.  She didn't have a good explanation for that other than she just felt better at 1 mg.  States her insurance will not cover Livalo and will need Dr. Gwenlyn Found to write a letter explaining why she needs this particular medication.  She says she will get that information to him once she goes to pharmacy to fill prescription.  In the meantime I gave her some 2 mg samples that she can cut into 1/2.  She will get the NMR profile in about 4-5 weeks, then has an appointment with Dr Gwenlyn Found at the end of May.  I told her that I would review those labs with him and we can decide what her best option is from there.

## 2014-08-14 DIAGNOSIS — I10 Essential (primary) hypertension: Secondary | ICD-10-CM | POA: Diagnosis not present

## 2014-08-14 DIAGNOSIS — M859 Disorder of bone density and structure, unspecified: Secondary | ICD-10-CM | POA: Diagnosis not present

## 2014-08-14 DIAGNOSIS — E1121 Type 2 diabetes mellitus with diabetic nephropathy: Secondary | ICD-10-CM | POA: Diagnosis not present

## 2014-08-21 DIAGNOSIS — N182 Chronic kidney disease, stage 2 (mild): Secondary | ICD-10-CM | POA: Diagnosis not present

## 2014-08-21 DIAGNOSIS — I2510 Atherosclerotic heart disease of native coronary artery without angina pectoris: Secondary | ICD-10-CM | POA: Diagnosis not present

## 2014-08-21 DIAGNOSIS — E1122 Type 2 diabetes mellitus with diabetic chronic kidney disease: Secondary | ICD-10-CM | POA: Diagnosis not present

## 2014-08-21 DIAGNOSIS — I10 Essential (primary) hypertension: Secondary | ICD-10-CM | POA: Diagnosis not present

## 2014-08-21 DIAGNOSIS — Z23 Encounter for immunization: Secondary | ICD-10-CM | POA: Diagnosis not present

## 2014-08-30 DIAGNOSIS — H35361 Drusen (degenerative) of macula, right eye: Secondary | ICD-10-CM | POA: Diagnosis not present

## 2014-09-13 DIAGNOSIS — M189 Osteoarthritis of first carpometacarpal joint, unspecified: Secondary | ICD-10-CM | POA: Diagnosis not present

## 2014-09-13 DIAGNOSIS — M79644 Pain in right finger(s): Secondary | ICD-10-CM | POA: Diagnosis not present

## 2014-09-19 ENCOUNTER — Other Ambulatory Visit: Payer: Self-pay | Admitting: Cardiovascular Disease

## 2014-09-19 DIAGNOSIS — E785 Hyperlipidemia, unspecified: Secondary | ICD-10-CM | POA: Diagnosis not present

## 2014-09-21 LAB — NMR LIPOPROFILE WITH LIPIDS
Cholesterol, Total: 222 mg/dL — ABNORMAL HIGH (ref 100–199)
HDL Particle Number: 32.4 umol/L (ref >=30.5)
HDL Size: 8.8 nm — ABNORMAL LOW (ref >=9.2)
HDL-C: 49 mg/dL (ref >39)
LDL (calc): 133 mg/dL — ABNORMAL HIGH (ref 0–99)
LDL Particle Number: 2167 nmol/L — ABNORMAL HIGH (ref <1000)
LDL Size: 20 nm (ref >=20.8)
LP-IR Score: 76 — ABNORMAL HIGH (ref <=45)
Large HDL-P: 3.9 umol/L — ABNORMAL LOW (ref >=4.8)
Large VLDL-P: 10.8 nmol/L — ABNORMAL HIGH (ref <=2.7)
Small LDL Particle Number: 1453 nmol/L — ABNORMAL HIGH (ref <=527)
Triglycerides: 198 mg/dL — ABNORMAL HIGH (ref 0–149)
VLDL Size: 54.4 nm — ABNORMAL HIGH (ref <=46.6)

## 2014-10-05 ENCOUNTER — Telehealth: Payer: Self-pay | Admitting: Pharmacist Clinician (PhC)/ Clinical Pharmacy Specialist

## 2014-10-05 DIAGNOSIS — E785 Hyperlipidemia, unspecified: Secondary | ICD-10-CM

## 2014-10-05 MED ORDER — PITAVASTATIN CALCIUM 2 MG PO TABS: 2.0000 mg | ORAL_TABLET | Freq: Every day | ORAL | Status: DC

## 2014-10-05 NOTE — Telephone Encounter (Signed)
Reviewed lipid labs with patient.  LDL and particle numbers still elevated on Livalo 1 mg and Zetia 10 mg daily.  Will have patient increase Livalo to 2 mg daily today and if no problems with tolerability to 4 mg daily after 4 weeks.  Will leave samples at front desk of 2mg  plus Zetia.  Recommend rechecking NMR about 8 weeks after starting Livalo 4 mg daily.  Patient voiced understanding of plan.

## 2014-10-17 ENCOUNTER — Encounter: Payer: Self-pay | Admitting: Cardiovascular Disease

## 2014-10-17 ENCOUNTER — Ambulatory Visit (INDEPENDENT_AMBULATORY_CARE_PROVIDER_SITE_OTHER): Payer: Medicare Other | Admitting: Cardiovascular Disease

## 2014-10-17 ENCOUNTER — Telehealth: Payer: Self-pay | Admitting: *Deleted

## 2014-10-17 VITALS — BP 128/88 | HR 70 | Ht 61.75 in | Wt 145.0 lb

## 2014-10-17 DIAGNOSIS — Z79899 Other long term (current) drug therapy: Secondary | ICD-10-CM

## 2014-10-17 DIAGNOSIS — I2510 Atherosclerotic heart disease of native coronary artery without angina pectoris: Secondary | ICD-10-CM | POA: Diagnosis not present

## 2014-10-17 DIAGNOSIS — I10 Essential (primary) hypertension: Secondary | ICD-10-CM | POA: Diagnosis not present

## 2014-10-17 DIAGNOSIS — Z9889 Other specified postprocedural states: Secondary | ICD-10-CM | POA: Diagnosis not present

## 2014-10-17 DIAGNOSIS — E785 Hyperlipidemia, unspecified: Secondary | ICD-10-CM | POA: Diagnosis not present

## 2014-10-17 DIAGNOSIS — I2583 Coronary atherosclerosis due to lipid rich plaque: Secondary | ICD-10-CM

## 2014-10-17 DIAGNOSIS — Z95820 Peripheral vascular angioplasty status with implants and grafts: Secondary | ICD-10-CM

## 2014-10-17 MED ORDER — PITAVASTATIN CALCIUM 4 MG PO TABS: 4.0000 mg | ORAL_TABLET | Freq: Every day | ORAL | Status: DC

## 2014-10-17 NOTE — Assessment & Plan Note (Signed)
History of CAD status post catheterization 05/13/12 revealing 80% proximal LAD stenosis which I stented using a drug-eluting stent with a total dominant RCA and left right collaterals with normal LV function. She denies chest pain or shortness of breath. Her last Myoview stress test showed ischemia in the RCA territory explainable by her anatomy.

## 2014-10-17 NOTE — Assessment & Plan Note (Signed)
History of hyperlipidemia statin intolerant. This is being followed by Cyril Mourning in our lipid clinic. She does have an LDL particle number of over 2000. She also complains of short-term memory loss. I suspect also she will need to be started on PCSK9 monoclonal injectables.

## 2014-10-17 NOTE — Patient Instructions (Signed)
Medication Instructions:   Increase Livalo to 4mg  daily  Labwork:  A FASTING lipid profile: to be done 8 weeks after increasing the Livalo to 4 mg.  There is a Psychologist, forensic lab on the first floor of this building, [address].  They are open from 8am-5pm with a lunch from 12-2.  You do not need an appointment.                                                               Testing/Procedures:  N/A  Follow-Up:  1 year with Dr Gwenlyn Found.     Any Other Special Instructions Will Be Listed Below (If Applicable).

## 2014-10-17 NOTE — Telephone Encounter (Signed)
-----   Message from Rockne Menghini, [place] sent at 10/05/2014  4:01 PM EDT ----- She is seeing JB today,  Could you ask if she's tolerating increase of Livalo to 2 mg.  If so, go ahead and give her 2 weeks of the 4 mg tablets as that is my goal dose for her.  Also could you print out her NMR/hepatic lab orders - I told her to get them drawn 8 weeks after increasing to Livalo 4 mg daily.    Thanks,  Erasmo Downer

## 2014-10-17 NOTE — Assessment & Plan Note (Signed)
History of hypertension blood pressure measured today at 120/88. She is on Avapro and carvedilol. Continue current meds at current dosing

## 2014-10-17 NOTE — Telephone Encounter (Signed)
Patient is tolerating the 2mg  of Livalo.  I gave instructions to increase Livalo and recheck blood work in 8 weeks.

## 2014-10-17 NOTE — Progress Notes (Signed)
10/17/2014 Brittany Figueroa   Sep 26, 1940  [id number]  Primary Physician Thressa Sheller, MD Primary Cardiologist: [name] MD [name]   HPI:  The patient is a 74 year old mildly overweight divorced Caucasian female, mother to 2, grandmother to 3 grandchildren, who I last saw 12 months ago. She has a history of hypertension and hyperlipidemia. I saw her in December of 2013 with new anterolateral T-wave inversion and a Myoview that showed inferolateral ischemia that was new compared to a prior study. She did have some unusual symptoms at that time. Based on this, I catheterized her on May 13, 2012, revealing an 80% proximal LAD lesion, which I stented using a drug-eluting stent, as well as a total dominant RCA with left to right collaterals and normal LV function. Since stenting her LAD, she is ultimately asymptomatic. Her other problems include hypertension and hyperlipidemia. I have reviewed her blood pressures, which were recorded during cardiac rehab, which were all normal. Her recent blood work revealed a total cholesterol of 161, LDL of 89 and HDL of 40.she denies chest pain or shortness of breath. She had a recent Myoview stress test performed several months ago that showed ischemia in the RCA territory but otherwise unremarkable is explainable by her known total dominant RCA with left to right collaterals. Chief complaint of excruciating neck pain thought to be related to cervical disc disease. She was evaluated by Dr. Because of her neck pain demonstrated cervical disc disease requiring fusion. Because of this she will need to undergo pharmacologic Myoview stress testing to risk stratify her. She underwent cervical discectomy by Dr. Deri Fuelling with excellent clinical result. She no longer is in pain. Her major issue now is treatment of her hyperlipidemia. She is statin intolerant and complains of short-term memory loss. Ultimately, I think she will require P CSK9  monoclonal injectable therapy  Current Outpatient Prescriptions  Medication Sig Dispense Refill  . acetaminophen (TYLENOL) 500 MG tablet Take 500 mg by mouth every 6 (six) hours as needed for mild pain or headache.    Marland Kitchen aspirin 81 MG tablet Take 81 mg by mouth daily.    . calcium-vitamin D (OSCAL WITH D) 500-200 MG-UNIT per tablet Take 2 tablets by mouth daily.    . carvedilol (COREG) 6.25 MG tablet Take 6.25 mg by mouth 2 (two) times daily with a meal.     . Cholecalciferol (VITAMIN D3) 2000 UNITS TABS Take 2,000 Units by mouth 2 (two) times daily.    . clopidogrel (PLAVIX) 75 MG tablet TAKE 1 TABLET BY MOUTH DAILY 90 tablet 1  . diclofenac sodium (VOLTAREN) 1 % GEL Apply 2 g topically daily as needed. For pain    . ezetimibe (ZETIA) 10 MG tablet Take 10 mg by mouth daily.    . fish oil-omega-3 fatty acids 1000 MG capsule Take 1 g by mouth daily.     Marland Kitchen HYDROcodone-acetaminophen (NORCO/VICODIN) 5-325 MG per tablet Take 1-2 tablets by mouth every 4 (four) hours as needed for moderate pain. 60 tablet 0  . irbesartan (AVAPRO) 150 MG tablet Take 150 mg by mouth daily before breakfast.     . irbesartan (AVAPRO) 150 MG tablet TAKE 1 TABLET BY MOUTH EVERY DAY 30 tablet 5  . Multiple Vitamin (MULTIVITAMIN WITH MINERALS) TABS Take 1 tablet by mouth daily.    Marland Kitchen NITROSTAT 0.4 MG SL tablet PLACE 1 TABLET UNDER THE TONGUE EVERY 5 MINTUES AS NEEDED FOR CHEST PAIN 25 tablet 2  . OVER THE  COUNTER MEDICATION Take 1 tablet by mouth as needed (for indigestion). [employer] gummies    . Pitavastatin Calcium (LIVALO) 4 MG TABS Take 1 tablet (4 mg total) by mouth daily. 30 tablet 6   No current facility-administered medications for this visit.    Allergies  Allergen Reactions  . Statins Other (See Comments)    "intermittent loss of circulation; hands and arms will go to sleep; feet will get cramps; fatigue" (05/13/2012).  This occurred with Lipitor, Zocor, Crestor 5 mg qd, and she thinks pravastatin  as well    History   Social History  . Marital Status: Divorced    Spouse Name: N/A  . Number of Children: N/A  . Years of Education: N/A   Occupational History  . Not on file.   Social History Main Topics  . Smoking status: Former Smoker -- 0.50 packs/day for 20 years    Types: Cigarettes    Quit date: 07/05/1988  . Smokeless tobacco: Never Used  . Alcohol Use: 7.2 oz/week    12 Glasses of wine per week     Comment: 05/13/2012 "6-8 oz glass of red wine q hs"   . Drug Use: No  . Sexual Activity: No   Other Topics Concern  . Not on file   Social History Narrative     Review of Systems: General: negative for chills, fever, night sweats or weight changes.  Cardiovascular: negative for chest pain, dyspnea on exertion, edema, orthopnea, palpitations, paroxysmal nocturnal dyspnea or shortness of breath Dermatological: negative for rash Respiratory: negative for cough or wheezing Urologic: negative for hematuria Abdominal: negative for nausea, vomiting, diarrhea, bright red blood per rectum, melena, or hematemesis Neurologic: negative for visual changes, syncope, or dizziness All other systems reviewed and are otherwise negative except as noted above.    Blood pressure 128/88, pulse 70, height 5' 1.75" (1.568 m), weight 145 lb (65.772 kg).  General appearance: alert and no distress Neck: no adenopathy, no carotid bruit, no JVD, supple, symmetrical, trachea midline and thyroid not enlarged, symmetric, no tenderness/mass/nodules Lungs: clear to auscultation bilaterally Heart: regular rate and rhythm, S1, S2 normal, no murmur, click, rub or gallop Extremities: extremities normal, atraumatic, no cyanosis or edema  EKG normal sinus rhythm at 70 with left axis deviation and poor airway progression. I personally reviewed this EKG  ASSESSMENT AND PLAN:   S/P angioplasty with stent, LAD 05/13/12 History of CAD status post catheterization 05/13/12 revealing 80% proximal LAD  stenosis which I stented using a drug-eluting stent with a total dominant RCA and left right collaterals with normal LV function. She denies chest pain or shortness of breath. Her last Myoview stress test showed ischemia in the RCA territory explainable by her anatomy.   Hyperlipidemia History of hyperlipidemia statin intolerant. This is being followed by Cyril Mourning in our lipid clinic. She does have an LDL particle number of over 2000. She also complains of short-term memory loss. I suspect also she will need to be started on PCSK9 monoclonal injectables.   HTN (hypertension) History of hypertension blood pressure measured today at 120/88. She is on Avapro and carvedilol. Continue current meds at current dosing       [name] MD [facility], [facility] 10/17/2014 10:31 AM

## 2014-10-18 DIAGNOSIS — Z1231 Encounter for screening mammogram for malignant neoplasm of breast: Secondary | ICD-10-CM | POA: Diagnosis not present

## 2014-10-18 DIAGNOSIS — N951 Menopausal and female climacteric states: Secondary | ICD-10-CM | POA: Diagnosis not present

## 2014-10-19 ENCOUNTER — Other Ambulatory Visit: Payer: Self-pay | Admitting: *Deleted

## 2014-10-19 MED ORDER — PITAVASTATIN CALCIUM 4 MG PO TABS: 4.0000 mg | ORAL_TABLET | Freq: Every day | ORAL | Status: DC

## 2014-10-24 ENCOUNTER — Other Ambulatory Visit: Payer: Self-pay | Admitting: *Deleted

## 2014-10-24 MED ORDER — PITAVASTATIN CALCIUM 4 MG PO TABS: 4.0000 mg | ORAL_TABLET | Freq: Every day | ORAL | Status: DC

## 2014-10-25 ENCOUNTER — Other Ambulatory Visit: Payer: Self-pay | Admitting: *Deleted

## 2014-11-01 ENCOUNTER — Other Ambulatory Visit: Payer: Self-pay | Admitting: *Deleted

## 2014-11-08 DIAGNOSIS — L84 Corns and callosities: Secondary | ICD-10-CM | POA: Diagnosis not present

## 2014-11-08 DIAGNOSIS — L219 Seborrheic dermatitis, unspecified: Secondary | ICD-10-CM | POA: Diagnosis not present

## 2014-11-08 DIAGNOSIS — L309 Dermatitis, unspecified: Secondary | ICD-10-CM | POA: Diagnosis not present

## 2014-11-08 DIAGNOSIS — L820 Inflamed seborrheic keratosis: Secondary | ICD-10-CM | POA: Diagnosis not present

## 2014-11-09 ENCOUNTER — Other Ambulatory Visit: Payer: Self-pay | Admitting: Cardiovascular Disease

## 2014-11-09 NOTE — Telephone Encounter (Signed)
Rx(s) sent to pharmacy electronically.  

## 2014-11-13 ENCOUNTER — Other Ambulatory Visit: Payer: Self-pay

## 2014-11-21 ENCOUNTER — Telehealth: Payer: Self-pay | Admitting: *Deleted

## 2014-11-21 DIAGNOSIS — E559 Vitamin D deficiency, unspecified: Secondary | ICD-10-CM | POA: Diagnosis not present

## 2014-11-21 DIAGNOSIS — E1122 Type 2 diabetes mellitus with diabetic chronic kidney disease: Secondary | ICD-10-CM | POA: Diagnosis not present

## 2014-11-21 DIAGNOSIS — I10 Essential (primary) hypertension: Secondary | ICD-10-CM | POA: Diagnosis not present

## 2014-11-21 DIAGNOSIS — M8580 Other specified disorders of bone density and structure, unspecified site: Secondary | ICD-10-CM | POA: Diagnosis not present

## 2014-11-21 DIAGNOSIS — E785 Hyperlipidemia, unspecified: Secondary | ICD-10-CM | POA: Diagnosis not present

## 2014-11-21 NOTE — Telephone Encounter (Signed)
PA approved for Livalo.  Patient has tried and failed atorvastatin, simvastatin, pravastatin and crestor.

## 2014-11-28 DIAGNOSIS — N182 Chronic kidney disease, stage 2 (mild): Secondary | ICD-10-CM | POA: Diagnosis not present

## 2014-11-28 DIAGNOSIS — I1310 Hypertensive heart and chronic kidney disease without heart failure, with stage 1 through stage 4 chronic kidney disease, or unspecified chronic kidney disease: Secondary | ICD-10-CM | POA: Diagnosis not present

## 2014-11-28 DIAGNOSIS — F17211 Nicotine dependence, cigarettes, in remission: Secondary | ICD-10-CM | POA: Diagnosis not present

## 2014-11-28 DIAGNOSIS — E1122 Type 2 diabetes mellitus with diabetic chronic kidney disease: Secondary | ICD-10-CM | POA: Diagnosis not present

## 2014-12-13 DIAGNOSIS — Z79899 Other long term (current) drug therapy: Secondary | ICD-10-CM | POA: Diagnosis not present

## 2014-12-13 DIAGNOSIS — E785 Hyperlipidemia, unspecified: Secondary | ICD-10-CM | POA: Diagnosis not present

## 2014-12-14 LAB — HEPATIC FUNCTION PANEL
ALT: 23 U/L (ref 6–29)
AST: 17 U/L (ref 10–35)
Albumin: 4 g/dL (ref 3.6–5.1)
Alkaline Phosphatase: 74 U/L (ref 33–130)
Bilirubin, Direct: 0.1 mg/dL (ref <=0.2)
Indirect Bilirubin: 0.3 mg/dL (ref 0.2–1.2)
Total Bilirubin: 0.4 mg/dL (ref 0.2–1.2)
Total Protein: 6.5 g/dL (ref 6.1–8.1)

## 2014-12-15 ENCOUNTER — Other Ambulatory Visit: Payer: Self-pay | Admitting: Cardiovascular Disease

## 2014-12-15 ENCOUNTER — Telehealth: Payer: Self-pay | Admitting: Pharmacist Clinician (PhC)/ Clinical Pharmacy Specialist

## 2014-12-15 LAB — NMR LIPOPROFILE WITH LIPIDS
Cholesterol, Total: 172 mg/dL (ref 100–199)
HDL Particle Number: 33 umol/L (ref >=30.5)
HDL Size: 8.8 nm — ABNORMAL LOW (ref >=9.2)
HDL-C: 50 mg/dL (ref >39)
LDL (calc): 97 mg/dL (ref 0–99)
LDL Particle Number: 1532 nmol/L — ABNORMAL HIGH (ref <1000)
LDL Size: 20.5 nm (ref >=20.8)
LP-IR Score: 73 — ABNORMAL HIGH (ref <=45)
Large HDL-P: 4 umol/L — ABNORMAL LOW (ref >=4.8)
Large VLDL-P: 4.9 nmol/L — ABNORMAL HIGH (ref <=2.7)
Small LDL Particle Number: 907 nmol/L — ABNORMAL HIGH (ref <=527)
Triglycerides: 123 mg/dL (ref 0–149)
VLDL Size: 56.4 nm — ABNORMAL HIGH (ref <=46.6)

## 2014-12-15 NOTE — Telephone Encounter (Signed)
Unable to [facility] about cholesterol labs, will call again Monday

## 2014-12-15 NOTE — Telephone Encounter (Signed)
Spoke with patient, states had labs done 3-4 weeks ago at [facility] and LDL was 87.  Was 75 when drawn this week.  Pt notes that she has been unable to stand for any length of time due to sever pains in her legs and feet.  Stopped Livalo yesterday, but is continuing with the Zetia.  Is looking into cholesterol study with [place], will make a decision some time in the next week.  I asked that she call to let us know what she wished to do and if the pains are relieved once she has been off the Livalo for a week or two.  Pt agreed

## 2014-12-15 NOTE — Telephone Encounter (Signed)
-----   Message from Chauncy Lean, RN sent at 12/15/2014  9:31 AM EDT ----- Patient is already seeing Erasmo Downer in the lipid clinic.  I will defer to [facility].

## 2014-12-15 NOTE — Telephone Encounter (Signed)
REFILL 

## 2014-12-21 DIAGNOSIS — R739 Hyperglycemia, unspecified: Secondary | ICD-10-CM | POA: Diagnosis not present

## 2014-12-21 DIAGNOSIS — I10 Essential (primary) hypertension: Secondary | ICD-10-CM | POA: Diagnosis not present

## 2014-12-21 DIAGNOSIS — E785 Hyperlipidemia, unspecified: Secondary | ICD-10-CM | POA: Diagnosis not present

## 2014-12-25 ENCOUNTER — Telehealth: Payer: Self-pay | Admitting: Cardiovascular Disease

## 2014-12-25 NOTE — Telephone Encounter (Signed)
Brittany Figueroa is calling because he bp has been going up this morning it is 167/96. Please call   Thanks

## 2014-12-25 NOTE — Telephone Encounter (Signed)
Spoke to patient about schedule BP check appointment with Erasmo Downer. Patient scheduled 8/11 @ 0800.

## 2014-12-25 NOTE — Telephone Encounter (Signed)
Spoke with patient who complains of high BP. She reports fatigue. She has no other symptoms. She reports she has put on a little bit of weight.   8/4  954p 135/79 8/5  AM 148/86 8/6 8a 155/90 8/6 1119a 154/91 8/6 1233p 149/88 8/6 747p 129/81  8/7 1156a 140/89 8/7 322p 133/74 8/7 532p 153/89 8/7 532p 153/89 8/7 845p 175/100 >> 3 mins later in different arm 142/93 8/8 811a 163/87 (before eating/drinking anything) 8/8 1001a 167/96 (average of 3 readings)  She takes coreg 730 am/pm She takes irbesartan between 730-9am  Patient had BP checked at [facility] and SBP was 160s  Will route to Dr. Gwenlyn Found & Erasmo Downer to review and advise if med changes are appropriate or if patient needs BP check appointment

## 2014-12-25 NOTE — Telephone Encounter (Signed)
Have pt see Erasmo Downer in office to review

## 2014-12-28 ENCOUNTER — Ambulatory Visit (INDEPENDENT_AMBULATORY_CARE_PROVIDER_SITE_OTHER): Payer: Medicare Other | Admitting: Pharmacist Clinician (PhC)/ Clinical Pharmacy Specialist

## 2014-12-28 ENCOUNTER — Other Ambulatory Visit: Payer: Self-pay | Admitting: Cardiovascular Disease

## 2014-12-28 ENCOUNTER — Encounter: Payer: Self-pay | Admitting: Pharmacist Clinician (PhC)/ Clinical Pharmacy Specialist

## 2014-12-28 VITALS — BP 176/96 | HR 68 | Ht 61.75 in | Wt 143.8 lb

## 2014-12-28 DIAGNOSIS — I2510 Atherosclerotic heart disease of native coronary artery without angina pectoris: Secondary | ICD-10-CM

## 2014-12-28 DIAGNOSIS — I10 Essential (primary) hypertension: Secondary | ICD-10-CM | POA: Diagnosis not present

## 2014-12-28 MED ORDER — AMLODIPINE BESYLATE 2.5 MG PO TABS: 2.5000 mg | ORAL_TABLET | Freq: Every day | ORAL | Status: DC

## 2014-12-28 NOTE — Assessment & Plan Note (Signed)
BP in office elevated at 176/96, with home cuff reading 163/102.  Pt states compliance with medications, took doses of irbesartan and carvedilol this am.  She is very hesitant to try new medications, when I listed 2 options she went thru her BP journal (back to 2008, with sporadic readings each year), she has notations of trying both amlodipine (2010?) and chlorthalidone (tried 2-3 different times, each < 1 month).  She is worried that adding medication will cause her BP to drop too low.  I tried to explain that right now her BP is too high, and if we start with a low dose of either of these medications, she will most likely not become hypotensive.  Also suggested that we could not add meds, but instead increase her irbesartan to 300 mg, but she was sure that would bee "too much".  I did finally convince her to try amlodipine 2.5 mg each evening.   She wanted to know what side effects she should expect, so I explained that the most common was lower extremity edema, but is rarely seen at this low dose.  She will try the medication and I will see her back in 3 weeks for evaluation.  She is to continue with daily home BP checks.

## 2014-12-28 NOTE — Progress Notes (Signed)
12/28/2014 Brittany Figueroa 1941-03-23 [id number]   HPI:  Brittany Figueroa is a 74 y.o. female patient of Dr Gwenlyn Found, with a PMH below who presents today for hypertension clinic evaluation.  We have previously worked with her on lipid management.  She had problems with almost all medications and is currently off all lipid medications in anticipation of starting a drug study for hyperlipidemia in September with Reinaldo Meeker.  She describes herself as being sensitive to most medications and has an ongoing journal of blood pressure readings, including start and stop dates of various meds, but no indication of why they were stopped.  She sites adverse reactions, but doesn't recall what they were.  This list includes losartan, chlorthalidone and amlodipine.  Cardiac Hx: LAD stent 2013; hyperlipidemia; impaired glucose tolerance  Family Hx: mother and multiple siblings with hyperlipidemia, hypertension; knows little of father's family  Social Hx: no tobacco, drinks 1 glass red wine with dinner  Diet: uses some salt, but only on specific items  Exercise: previously walked regularly, now has burning problem on soles of feet (thinks caused by Livalo); has started using stationary bike and hopes to go back to gym soon  Home BP readings:  Systolic 671-245, mostly [id number] only few <825; diastolic 05-397 with average in the 80s-low 90s  Current antihypertensive medications: irbesartan 150 mg qd, carvedilol 6.25 mg bid   Current Outpatient Prescriptions  Medication Sig Dispense Refill  . acetaminophen (TYLENOL) 500 MG tablet Take 500 mg by mouth every 6 (six) hours as needed for mild pain or headache.    Marland Kitchen aspirin 81 MG tablet Take 81 mg by mouth daily.    . calcium-vitamin D (OSCAL WITH D) 500-200 MG-UNIT per tablet Take 2 tablets by mouth daily.    . carvedilol (COREG) 6.25 MG tablet Take 6.25 mg by mouth 2 (two) times daily with a meal.     . Cholecalciferol (VITAMIN D3) 2000 UNITS TABS Take 2,000  Units by mouth 2 (two) times daily.    . clopidogrel (PLAVIX) 75 MG tablet TAKE 1 TABLET BY MOUTH EVERY DAY 90 tablet 3  . diclofenac sodium (VOLTAREN) 1 % GEL Apply 2 g topically daily as needed. For pain    . irbesartan (AVAPRO) 150 MG tablet TAKE 1 TABLET BY MOUTH EVERY DAY 30 tablet 6  . Multiple Vitamin (MULTIVITAMIN WITH MINERALS) TABS Take 1 tablet by mouth daily.    Marland Kitchen NITROSTAT 0.4 MG SL tablet PLACE 1 TABLET UNDER THE TONGUE EVERY 5 MINTUES AS NEEDED FOR CHEST PAIN 25 tablet 2   No current facility-administered medications for this visit.    Allergies  Allergen Reactions  . Statins Other (See Comments)    "intermittent loss of circulation; hands and arms will go to sleep; feet will get cramps; fatigue" (05/13/2012).  This occurred with Lipitor, Zocor, Crestor 5 mg qd, and she thinks pravastatin as well    Past Medical History  Diagnosis Date  . Complication of anesthesia 12/2009    "had endoscopy; larynx went into spasms; stopped breathing for 15 seconds" (05/13/2012)  . Hypercholesteremia   . Hypertension   . GERD (gastroesophageal reflux disease) 2011  . Abnormal finding on EKG, new anterolateral T-wave inversions  05/14/2012  . Abnormal nuclear stress test, with infero-lateral ischemia 05/14/2012  . Atypical angina 05/14/2012  . CAD (coronary artery disease), 05/13/12, with 80% LAD 05/14/2012  . S/P angioplasty with stent, LAD 05/13/12 05/14/2012  . Tuberculosis     test +-  [place] [facility] - Dr. [name]- CXR- OK  . Arthritis     "right thumb; all my fingers" (05/13/2012), cervical spondylosis   . [facility] arthritis, thumb, degenerative   . Cancer     basal cell on facial in L temporal region      Blood pressure 176/96, pulse 68, height 5' 1.75" (1.568 m), weight 143 lb 12.8 oz (65.227 kg).    Tommy Medal PharmD CPP [facility]

## 2014-12-28 NOTE — Patient Instructions (Signed)
Return for a a follow up appointment in 3 weeks   Your blood pressure today is 176/96 (goal is <150/90)  Check your blood pressure at home daily and keep record of the readings.  Take your BP meds as follows: continue irbesartan 150 mg each morning; amlodipine 2.5 mg each evening; carvedilol 6.25 mg bid  Bring all of your meds, your BP cuff and your record of home blood pressures to your next appointment.  Exercise as you're able, try to walk approximately 30 minutes per day.  Keep salt intake to a minimum, especially watch canned and prepared boxed foods.  Eat more fresh fruits and vegetables and fewer canned items.  Avoid eating in fast food restaurants.    HOW TO TAKE YOUR BLOOD PRESSURE: . Rest 5 minutes before taking your blood pressure. .  Don't smoke or drink caffeinated beverages for at least 30 minutes before. . Take your blood pressure before (not after) you eat. . Sit comfortably with your back supported and both feet on the floor (don't cross your legs). . Elevate your arm to heart level on a table or a desk. . Use the proper sized cuff. It should fit smoothly and snugly around your bare upper arm. There should be enough room to slip a fingertip under the cuff. The bottom edge of the cuff should be 1 inch above the crease of the elbow. . Ideally, take 3 measurements at one sitting and record the average.

## 2014-12-28 NOTE — Telephone Encounter (Signed)
Rx(s) sent to pharmacy electronically.  

## 2015-01-18 ENCOUNTER — Ambulatory Visit (INDEPENDENT_AMBULATORY_CARE_PROVIDER_SITE_OTHER): Payer: Medicare Other | Admitting: Pharmacist Clinician (PhC)/ Clinical Pharmacy Specialist

## 2015-01-18 VITALS — BP 122/74 | HR 76 | Ht 61.75 in | Wt 143.4 lb

## 2015-01-18 DIAGNOSIS — I10 Essential (primary) hypertension: Secondary | ICD-10-CM

## 2015-01-18 DIAGNOSIS — I2510 Atherosclerotic heart disease of native coronary artery without angina pectoris: Secondary | ICD-10-CM | POA: Diagnosis not present

## 2015-01-18 MED ORDER — AMLODIPINE BESYLATE 2.5 MG PO TABS: 2.5000 mg | ORAL_TABLET | Freq: Every day | ORAL | Status: DC

## 2015-01-18 NOTE — Patient Instructions (Signed)
  Your blood pressure today is 122/74  Check your blood pressure at home daily or even every other day and keep record of the readings.  Take your BP meds as follows: continue with irbesartan 150 mg daily, carvedilol 6.25 mg twice daily, amlodipine 2.5 mg daily  Bring all of your meds, your BP cuff and your record of home blood pressures to your next appointment.  Exercise as you're able, try to walk approximately 30 minutes per day.  Keep salt intake to a minimum, especially watch canned and prepared boxed foods.  Eat more fresh fruits and vegetables and fewer canned items.  Avoid eating in fast food restaurants.    HOW TO TAKE YOUR BLOOD PRESSURE: . Rest 5 minutes before taking your blood pressure. .  Don't smoke or drink caffeinated beverages for at least 30 minutes before. . Take your blood pressure before (not after) you eat. . Sit comfortably with your back supported and both feet on the floor (don't cross your legs). . Elevate your arm to heart level on a table or a desk. . Use the proper sized cuff. It should fit smoothly and snugly around your bare upper arm. There should be enough room to slip a fingertip under the cuff. The bottom edge of the cuff should be 1 inch above the crease of the elbow. . Ideally, take 3 measurements at one sitting and record the average.

## 2015-01-18 NOTE — Progress Notes (Signed)
01/18/2015 SATIA WINGER Oct 26, 1940 [id number]   HPI:  Brittany Figueroa is a 74 y.o. female patient of Dr Gwenlyn Found, with a PMH below who presents today for hypertension clinic follow up.  We have also previously worked with her on lipid management.  She had problems with almost all medications and is currently off all lipid medications in anticipation of starting a drug study for hyperlipidemia in September with Reinaldo Meeker.  She describes herself as being sensitive to most medications and has an ongoing journal of blood pressure readings, including start and stop dates of various meds, but no indication of why they were stopped.  She sites adverse reactions, but doesn't recall what they were.  This list includes losartan, chlorthalidone and amlodipine.  She also notes that when she does take BP medications, her pressure drops "too low" and that worries her almost more than having high readings.  At her last visit with me we re-introduced amlodipine 2.5 mg.  Since then her BP readings at home have stabilized greatly.  Of over 60 readings, only 3 were > 793 systolic, with the highest being 154.  Her lowest systolic was 94 and the majority were between 120-145.  Diastolic readings all WNL.  She states today that the nerve pain in her feet has decreased since starting amlodipine, but she does feel as though her skin itches more at times.    Cardiac Hx: LAD stent 2013; hyperlipidemia; impaired glucose tolerance  Family Hx: mother and multiple siblings with hyperlipidemia, hypertension; knows little of father's family  Social Hx: no tobacco, drinks 1 glass red wine with dinner  Diet: uses some salt, but only on specific items  Exercise: previously walked regularly, now has burning problem on soles of feet (thinks caused by Livalo); has started using stationary bike and hopes to go back to gym soon  Home BP readings:  Systolic 90-300, mostly 923-300, diastolic all WNL  Current antihypertensive  medications: irbesartan 150 mg qd, carvedilol 6.25 mg bid, amlodipine 2.5 mg qd   Current Outpatient Prescriptions  Medication Sig Dispense Refill  . acetaminophen (TYLENOL) 500 MG tablet Take 500 mg by mouth every 6 (six) hours as needed for mild pain or headache.    Marland Kitchen amLODipine (NORVASC) 2.5 MG tablet Take 1 tablet (2.5 mg total) by mouth daily. 90 tablet 2  . aspirin 81 MG tablet Take 81 mg by mouth daily.    . calcium-vitamin D (OSCAL WITH D) 500-200 MG-UNIT per tablet Take 2 tablets by mouth daily.    . carvedilol (COREG) 6.25 MG tablet Take 6.25 mg by mouth 2 (two) times daily with a meal.     . Cholecalciferol (VITAMIN D3) 2000 UNITS TABS Take 2,000 Units by mouth 2 (two) times daily.    . clopidogrel (PLAVIX) 75 MG tablet TAKE 1 TABLET BY MOUTH EVERY DAY 90 tablet 3  . diclofenac sodium (VOLTAREN) 1 % GEL Apply 2 g topically daily as needed. For pain    . irbesartan (AVAPRO) 150 MG tablet TAKE 1 TABLET BY MOUTH EVERY DAY 30 tablet 6  . Multiple Vitamin (MULTIVITAMIN WITH MINERALS) TABS Take 1 tablet by mouth daily.    Marland Kitchen NITROSTAT 0.4 MG SL tablet PLACE 1 TABLET UNDER THE TONGUE EVERY 5 MINTUES AS NEEDED FOR CHEST PAIN 25 tablet 2   No current facility-administered medications for this visit.    Allergies  Allergen Reactions  . Statins Other (See Comments)    "intermittent loss of circulation; hands  and arms will go to sleep; feet will get cramps; fatigue" (05/13/2012).  This occurred with Lipitor, Zocor, Crestor 5 mg qd, and she thinks pravastatin as well    Past Medical History  Diagnosis Date  . Complication of anesthesia 12/2009    "had endoscopy; larynx went into spasms; stopped breathing for 15 seconds" (05/13/2012)  . Hypercholesteremia   . Hypertension   . GERD (gastroesophageal reflux disease) 2011  . Abnormal finding on EKG, new anterolateral T-wave inversions  05/14/2012  . Abnormal nuclear stress test, with infero-lateral ischemia 05/14/2012  . Atypical angina  05/14/2012  . CAD (coronary artery disease), 05/13/12, with 80% LAD 05/14/2012  . S/P angioplasty with stent, LAD 05/13/12 05/14/2012  . Tuberculosis     test +- [place] [facility] - Dr. [name]- CXR- OK  . Arthritis     "right thumb; all my fingers" (05/13/2012), cervical spondylosis   . CMC arthritis, thumb, degenerative   . Cancer     basal cell on facial in L temporal region      Height 5' 1.75" (1.568 m), weight 143 lb 6.4 oz (65.046 kg).    Tommy Medal PharmD CPP [facility]

## 2015-01-18 NOTE — Assessment & Plan Note (Signed)
Patient with previously labile BP readings, have been stable for the past 3 weeks.  She has had previous sensitivities to multiple medications, making it harder to treat her.  At her last visit I added amlodipine 2.5 mg to her regimen.  She was hesitant to start a new medication, but even more so to increase her irbesartan from 150 mg to 300 mg.  She does report an increase in itching recently, so we may have to stop the amlodipine if it does not stop soon.  My only other option at this time would be spironolactone.  I have asked that she continue to monitor her BP at home and let me know if the readings trend to too high or too low.  She brought her home BP cuff into the office today, an [employer] model that takes 3 readings and averages them.  Her cuff read within 5 points of the office measurement.

## 2015-01-21 ENCOUNTER — Encounter: Payer: Self-pay | Admitting: Pharmacist Clinician (PhC)/ Clinical Pharmacy Specialist

## 2015-01-23 ENCOUNTER — Telehealth: Payer: Self-pay | Admitting: Pharmacist Clinician (PhC)/ Clinical Pharmacy Specialist

## 2015-01-23 NOTE — Telephone Encounter (Signed)
Pt reports itching worse since re-starting amlodipine.    Advised her to discontinue and keep checking home BP readings.  She is to call back in 7-10 days to report on if itching has gone away and what her BP is doing.  Today she reports home BP running in the 701-410V systolic

## 2015-01-30 DIAGNOSIS — G44201 Tension-type headache, unspecified, intractable: Secondary | ICD-10-CM | POA: Diagnosis not present

## 2015-02-20 ENCOUNTER — Telehealth: Payer: Self-pay | Admitting: Pharmacist Clinician (PhC)/ Clinical Pharmacy Specialist

## 2015-02-20 NOTE — Telephone Encounter (Signed)
Pt called, LM to call her regarding BP and lipids  Returned call, spent 15 minutes on phone with patient, she was concerned about a few recent elevated BP readings, tending to run high in mornings but levels out to WNL by mid-day.  Current medications are irbesartan 150 mg qam and carvedilol 6.25 mg bid.  Was unable to tolerate amlodipine (itching) as well as multiple other medications.  Was not able to join lipid study with Reinaldo Meeker.  So patient was restarted on Livalo three times per week and Zetia daily, but c/o increasing muscle pains and only took Livalo 2 x this past week.  Advised that she d/c Livalo, she has had multiple statin sensitivities.  Will need to see current LDL to consider PCSK-9 therapy.    Also concerned about LEE, thinks she may have heart failure.    Scheduled patient to come in for appt, will review everything then.  Pt voiced understanding.

## 2015-03-06 ENCOUNTER — Ambulatory Visit (INDEPENDENT_AMBULATORY_CARE_PROVIDER_SITE_OTHER): Payer: Medicare Other | Admitting: Pharmacist Clinician (PhC)/ Clinical Pharmacy Specialist

## 2015-03-06 VITALS — BP 152/78 | Ht 62.0 in | Wt 146.1 lb

## 2015-03-06 DIAGNOSIS — I1310 Hypertensive heart and chronic kidney disease without heart failure, with stage 1 through stage 4 chronic kidney disease, or unspecified chronic kidney disease: Secondary | ICD-10-CM | POA: Diagnosis not present

## 2015-03-06 DIAGNOSIS — I2510 Atherosclerotic heart disease of native coronary artery without angina pectoris: Secondary | ICD-10-CM | POA: Diagnosis not present

## 2015-03-06 DIAGNOSIS — Z79899 Other long term (current) drug therapy: Secondary | ICD-10-CM | POA: Diagnosis not present

## 2015-03-06 DIAGNOSIS — I10 Essential (primary) hypertension: Secondary | ICD-10-CM

## 2015-03-06 DIAGNOSIS — M8580 Other specified disorders of bone density and structure, unspecified site: Secondary | ICD-10-CM | POA: Diagnosis not present

## 2015-03-06 DIAGNOSIS — E118 Type 2 diabetes mellitus with unspecified complications: Secondary | ICD-10-CM | POA: Diagnosis not present

## 2015-03-06 DIAGNOSIS — E559 Vitamin D deficiency, unspecified: Secondary | ICD-10-CM | POA: Diagnosis not present

## 2015-03-06 DIAGNOSIS — E1122 Type 2 diabetes mellitus with diabetic chronic kidney disease: Secondary | ICD-10-CM | POA: Diagnosis not present

## 2015-03-06 DIAGNOSIS — E785 Hyperlipidemia, unspecified: Secondary | ICD-10-CM | POA: Diagnosis not present

## 2015-03-06 DIAGNOSIS — H353111 Nonexudative age-related macular degeneration, right eye, early dry stage: Secondary | ICD-10-CM | POA: Diagnosis not present

## 2015-03-06 NOTE — Patient Instructions (Signed)
Return for a a follow up appointment in 6 weeks   Your blood pressure today is 152/78  Check your blood pressure at home daily and keep record of the readings.  Take your BP meds as follows: divide irbesartan to 1/2 tablet (75 mg) twice daily, all other medications the same  Bring all of your meds, your BP cuff and your record of home blood pressures to your next appointment.  Exercise as you're able, try to walk approximately 30 minutes per day.  Keep salt intake to a minimum, especially watch canned and prepared boxed foods.  Eat more fresh fruits and vegetables and fewer canned items.  Avoid eating in fast food restaurants.    HOW TO TAKE YOUR BLOOD PRESSURE: . Rest 5 minutes before taking your blood pressure. .  Don't smoke or drink caffeinated beverages for at least 30 minutes before. . Take your blood pressure before (not after) you eat. . Sit comfortably with your back supported and both feet on the floor (don't cross your legs). . Elevate your arm to heart level on a table or a desk. . Use the proper sized cuff. It should fit smoothly and snugly around your bare upper arm. There should be enough room to slip a fingertip under the cuff. The bottom edge of the cuff should be 1 inch above the crease of the elbow. . Ideally, take 3 measurements at one sitting and record the average.

## 2015-03-07 ENCOUNTER — Encounter: Payer: Self-pay | Admitting: Pharmacist Clinician (PhC)/ Clinical Pharmacy Specialist

## 2015-03-07 NOTE — Assessment & Plan Note (Signed)
Blood pressure looks better overall, but still has higher numbers in the AM.  Suspect that the irbesartan is not lasting a full 24 hours in this patient.  Have tried to get her to increase the dose to 300 mg daily, but she is quite hesitant, thinking that would lower her pressure too much.  She is also quite resistant to try new meds, and there are not many more to choose from.  Spironolactone or an alpha blocker are about the only meds left to try, but she is resistant to change.  Because of her concerns, she would prefer to try cutting her irbesartan to 75 mg bid and see if this will be enough to give her better readings around the clock.  I warned her that this may not work, and she may see an increase in her evening BP readings as well. She is to call if readings increase overall, otherwise I will see her back in 1 month

## 2015-03-07 NOTE — Progress Notes (Signed)
03/07/2015 Brittany Figueroa January 22, 1941 [id number]   HPI:  Brittany Figueroa is a 74 y.o. female patient of Dr Gwenlyn Found, with a PMH below who presents today for hypertension clinic evaluation.  We have previously worked with her on lipid management.  She had problems with almost all medications and is currently off all lipid medications in anticipation of starting a drug study for hyperlipidemia in September with Reinaldo Meeker.  She describes herself as being sensitive to most medications and has an ongoing journal of blood pressure readings, including start and stop dates of various meds, but not always an indication of why they were stopped.  We do know that amlodipine caused her to itch.    Cardiac Hx: LAD stent 2013; hyperlipidemia; impaired glucose tolerance  Family Hx: mother and multiple siblings with hyperlipidemia, hypertension; knows little of father's family  Social Hx: no tobacco, drinks 1 glass red wine with dinner  Diet: uses some salt, but only on specific items  Exercise: none currently, burning sensation on feet not as bad as previously  Home BP readings:  Only 20/50 readings taken in past month >536 systolic, with the highest being 172.    Current antihypertensive medications: irbesartan 150 mg qd, carvedilol 6.25 mg bid   Current Outpatient Prescriptions  Medication Sig Dispense Refill  . acetaminophen (TYLENOL) 500 MG tablet Take 500 mg by mouth every 6 (six) hours as needed for mild pain or headache.    Marland Kitchen aspirin 81 MG tablet Take 81 mg by mouth daily.    . calcium-vitamin D (OSCAL WITH D) 500-200 MG-UNIT per tablet Take 2 tablets by mouth daily.    . carvedilol (COREG) 6.25 MG tablet Take 6.25 mg by mouth 2 (two) times daily with a meal.     . Cholecalciferol (VITAMIN D3) 2000 UNITS TABS Take 2,000 Units by mouth 2 (two) times daily.    . clopidogrel (PLAVIX) 75 MG tablet TAKE 1 TABLET BY MOUTH EVERY DAY 90 tablet 3  . diclofenac sodium (VOLTAREN) 1 % GEL Apply 2 g  topically daily as needed. For pain    . irbesartan (AVAPRO) 150 MG tablet TAKE 1 TABLET BY MOUTH EVERY DAY 30 tablet 6  . Multiple Vitamin (MULTIVITAMIN WITH MINERALS) TABS Take 1 tablet by mouth daily.    Marland Kitchen NITROSTAT 0.4 MG SL tablet PLACE 1 TABLET UNDER THE TONGUE EVERY 5 MINTUES AS NEEDED FOR CHEST PAIN 25 tablet 2   No current facility-administered medications for this visit.    Allergies  Allergen Reactions  . Statins Other (See Comments)    "intermittent loss of circulation; hands and arms will go to sleep; feet will get cramps; fatigue" (05/13/2012).  This occurred with Lipitor, Zocor, Crestor 5 mg qd, and she thinks pravastatin as well  . Amlodipine Itching    Past Medical History  Diagnosis Date  . Complication of anesthesia 12/2009    "had endoscopy; larynx went into spasms; stopped breathing for 15 seconds" (05/13/2012)  . Hypercholesteremia   . Hypertension   . GERD (gastroesophageal reflux disease) 2011  . Abnormal finding on EKG, new anterolateral T-wave inversions  05/14/2012  . Abnormal nuclear stress test, with infero-lateral ischemia 05/14/2012  . Atypical angina ([place]) 05/14/2012  . CAD (coronary artery disease), 05/13/12, with 80% LAD 05/14/2012  . S/P angioplasty with stent, LAD 05/13/12 05/14/2012  . Tuberculosis     test +- [place] [facility] - Dr. [name]- CXR- OK  . Arthritis     "right thumb;  all my fingers" (05/13/2012), cervical spondylosis   . CMC arthritis, thumb, degenerative   . Cancer ([place])     basal cell on facial in L temporal region      Blood pressure 152/78, height 5\' 2"  (1.575 m), weight 146 lb 1.6 oz (66.271 kg).    Tommy Medal PharmD CPP [facility]

## 2015-03-12 ENCOUNTER — Telehealth: Payer: Self-pay | Admitting: Pharmacist Clinician (PhC)/ Clinical Pharmacy Specialist

## 2015-03-12 NOTE — Telephone Encounter (Signed)
Pt LMOM asking if we could increase her irbesartan to 225 mg daily, as she is still having some elevated BP readings in the mornings.    Called her back [facility], will try again tomorrow

## 2015-03-13 DIAGNOSIS — I10 Essential (primary) hypertension: Secondary | ICD-10-CM | POA: Diagnosis not present

## 2015-03-13 DIAGNOSIS — Z23 Encounter for immunization: Secondary | ICD-10-CM | POA: Diagnosis not present

## 2015-03-13 DIAGNOSIS — I2510 Atherosclerotic heart disease of native coronary artery without angina pectoris: Secondary | ICD-10-CM | POA: Diagnosis not present

## 2015-03-13 DIAGNOSIS — E7800 Pure hypercholesterolemia, unspecified: Secondary | ICD-10-CM | POA: Diagnosis not present

## 2015-03-20 MED ORDER — PRAVASTATIN SODIUM 20 MG PO TABS: 20.0000 mg | ORAL_TABLET | Freq: Every evening | ORAL | Status: DC

## 2015-03-20 NOTE — Telephone Encounter (Signed)
Also worried that she "can't go around with her LDL that high and no cholesterol medication".  She is on Zetia and would like to try pravastatin again, as she took it 15 + years ago and thought it helped.  Will have her start that and repeat lipids and LFT in early December.  Pt voiced understanding

## 2015-03-20 NOTE — Telephone Encounter (Signed)
Patient is inquiring if you have spoken with the doctor regarding her blood pressure and cholesterol.

## 2015-03-20 NOTE — Telephone Encounter (Signed)
Spoke with patient >15 minutes.  She is afraid of high blood pressure readings (systolic going into 356-701I, diastolic 10V), yet she is also afraid to take more BP medications because she thinks her pressure will go too low.  Her high readings tend to be in the mornings, leveling out in the afternoons to 130/80s.  She has been on irbesartan 150 mg, but divides it into 75 mg bid because of the fear of low readings.  She did take a whole tablet this am (150mg ) when her reading was 151/97.  She thinks she needs the whole tablet each night.  I suggested she go ahead and take 150 mg again tonight around 11pm, then nightly thereafter and see what changes occur in her BP.  She states if this doesn't work, we'll need to try something different.  I explained that we have other medications, but she would need to stay on the irbesartan and we would add other meds, it often takes multiple meds to control BP.   Also worried about her cholesterol.  Has had statin problems in the past, including elevated LFTs

## 2015-03-28 ENCOUNTER — Encounter: Payer: Self-pay | Admitting: Cardiovascular Disease

## 2015-04-10 DIAGNOSIS — L304 Erythema intertrigo: Secondary | ICD-10-CM | POA: Diagnosis not present

## 2015-04-10 DIAGNOSIS — L820 Inflamed seborrheic keratosis: Secondary | ICD-10-CM | POA: Diagnosis not present

## 2015-04-19 ENCOUNTER — Ambulatory Visit (INDEPENDENT_AMBULATORY_CARE_PROVIDER_SITE_OTHER): Payer: Medicare Other | Admitting: Pharmacist Clinician (PhC)/ Clinical Pharmacy Specialist

## 2015-04-19 ENCOUNTER — Encounter: Payer: Self-pay | Admitting: Pharmacist Clinician (PhC)/ Clinical Pharmacy Specialist

## 2015-04-19 VITALS — BP 138/68 | Ht 62.0 in | Wt 145.8 lb

## 2015-04-19 DIAGNOSIS — I2510 Atherosclerotic heart disease of native coronary artery without angina pectoris: Secondary | ICD-10-CM

## 2015-04-19 DIAGNOSIS — I10 Essential (primary) hypertension: Secondary | ICD-10-CM | POA: Diagnosis not present

## 2015-04-19 NOTE — Progress Notes (Signed)
04/19/2015 Brittany Figueroa 1940/06/02 [id number]   HPI:  Brittany Figueroa is a 73 y.o. female patient of Dr Gwenlyn Found, with a PMH below who presents today for hypertension clinic follow up.  She has played with time of day for her BP medications for awhile and has finally discovered that taking her irbesartan 150 mg in the evening gives her the best control.  She describes times of low blood pressure, and how symptomatic it becomes, but she never checks her pressure at these times to see if it is a true correlation.    I have also previously worked with her on lipid management.  She takes Zetia and pravastatin, but is planning to join a drug study with Brittany Figueroa.  When it was explained that she has equal chance of getting placebo or study drug, she  got upset, and is now questioning whether it would be safe.  I explained that since there is nothing we can do to further lower her LDL (204 at last draw), she would be better off to join the study and at least have a 50/50 chance of getting the new drug.   She describes herself as being sensitive to most medications and has an ongoing journal of blood pressure readings, including start and stop dates of various meds, but not always an indication of why they were stopped.  We do know that amlodipine caused her to itch.    Cardiac Hx: LAD stent 2013; hyperlipidemia; impaired glucose tolerance  Family Hx: mother and multiple siblings with hyperlipidemia, hypertension; knows little of father's family  Social Hx: no tobacco, drinks 1 glass red wine with dinner  Diet: uses some salt, but only on specific items  Exercise: none currently, burning sensation on feet not as bad as previously  Home BP readings:  Has checked her pressure > 100 times in the past 6 weeks, and since Nov 8, all numbers have been WNL    Current antihypertensive medications: irbesartan 150 mg qd, carvedilol 6.25 mg bid   Current Outpatient Prescriptions  Medication Sig Dispense Refill   . acetaminophen (TYLENOL) 500 MG tablet Take 500 mg by mouth every 6 (six) hours as needed for mild pain or headache.    Marland Kitchen aspirin 81 MG tablet Take 81 mg by mouth daily.    . calcium-vitamin D (OSCAL WITH D) 500-200 MG-UNIT per tablet Take 2 tablets by mouth daily.    . carvedilol (COREG) 6.25 MG tablet Take 6.25 mg by mouth 2 (two) times daily with a meal.     . Cholecalciferol (VITAMIN D3) 2000 UNITS TABS Take 2,000 Units by mouth 2 (two) times daily.    . clopidogrel (PLAVIX) 75 MG tablet TAKE 1 TABLET BY MOUTH EVERY DAY 90 tablet 3  . diclofenac sodium (VOLTAREN) 1 % GEL Apply 2 g topically daily as needed. For pain    . irbesartan (AVAPRO) 150 MG tablet TAKE 1 TABLET BY MOUTH EVERY DAY 30 tablet 6  . Multiple Vitamin (MULTIVITAMIN WITH MINERALS) TABS Take 1 tablet by mouth daily.    Marland Kitchen NITROSTAT 0.4 MG SL tablet PLACE 1 TABLET UNDER THE TONGUE EVERY 5 MINTUES AS NEEDED FOR CHEST PAIN 25 tablet 2  . pravastatin (PRAVACHOL) 20 MG tablet Take 1 tablet (20 mg total) by mouth every evening. 30 tablet 3   No current facility-administered medications for this visit.    Allergies  Allergen Reactions  . Statins Other (See Comments)    "intermittent loss of  circulation; hands and arms will go to sleep; feet will get cramps; fatigue" (05/13/2012).  This occurred with Lipitor, Zocor, Crestor 5 mg qd, and she thinks pravastatin as well  . Amlodipine Itching    Past Medical History  Diagnosis Date  . Complication of anesthesia 12/2009    "had endoscopy; larynx went into spasms; stopped breathing for 15 seconds" (05/13/2012)  . Hypercholesteremia   . Hypertension   . GERD (gastroesophageal reflux disease) 2011  . Abnormal finding on EKG, new anterolateral T-wave inversions  05/14/2012  . Abnormal nuclear stress test, with infero-lateral ischemia 05/14/2012  . Atypical angina ([place]) 05/14/2012  . CAD (coronary artery disease), 05/13/12, with 80% LAD 05/14/2012  . S/P angioplasty with stent, LAD  05/13/12 05/14/2012  . Tuberculosis     test +- [place] [facility] - Dr. [name]- CXR- OK  . Arthritis     "right thumb; all my fingers" (05/13/2012), cervical spondylosis   . CMC arthritis, thumb, degenerative   . Cancer ([place])     basal cell on facial in L temporal region      Blood pressure 138/68, height 5\' 2"  (1.575 m), weight 145 lb 12.8 oz (66.134 kg).    Tommy Medal PharmD CPP [facility]

## 2015-04-19 NOTE — Patient Instructions (Signed)
  Your blood pressure today is 140/76  (goal is <150/90)  Check your blood pressure at home daily and keep record of the readings.  Take your BP meds as follows: continue with all your current medications  Bring all of your meds, your BP cuff and your record of home blood pressures to your next appointment.  Exercise as you're able, try to walk approximately 30 minutes per day.  Keep salt intake to a minimum, especially watch canned and prepared boxed foods.  Eat more fresh fruits and vegetables and fewer canned items.  Avoid eating in fast food restaurants.    HOW TO TAKE YOUR BLOOD PRESSURE: . Rest 5 minutes before taking your blood pressure. .  Don't smoke or drink caffeinated beverages for at least 30 minutes before. . Take your blood pressure before (not after) you eat. . Sit comfortably with your back supported and both feet on the floor (don't cross your legs). . Elevate your arm to heart level on a table or a desk. . Use the proper sized cuff. It should fit smoothly and snugly around your bare upper arm. There should be enough room to slip a fingertip under the cuff. The bottom edge of the cuff should be 1 inch above the crease of the elbow. . Ideally, take 3 measurements at one sitting and record the average.

## 2015-04-19 NOTE — Assessment & Plan Note (Signed)
Blood pressure seems to be much better controlled in the past month.  She will continue to monitor regularly and call should she have concerns.  She is to continue with her current medications

## 2015-04-25 DIAGNOSIS — M5441 Lumbago with sciatica, right side: Secondary | ICD-10-CM | POA: Diagnosis not present

## 2015-04-25 DIAGNOSIS — M19071 Primary osteoarthritis, right ankle and foot: Secondary | ICD-10-CM | POA: Diagnosis not present

## 2015-05-11 ENCOUNTER — Encounter: Payer: Self-pay | Admitting: Endocrinology

## 2015-05-11 ENCOUNTER — Ambulatory Visit (INDEPENDENT_AMBULATORY_CARE_PROVIDER_SITE_OTHER): Payer: Medicare Other | Admitting: Endocrinology

## 2015-05-11 VITALS — BP 128/78 | HR 70 | Temp 97.6°F | Resp 14 | Ht 64.0 in | Wt 145.0 lb

## 2015-05-11 DIAGNOSIS — R7303 Prediabetes: Secondary | ICD-10-CM | POA: Diagnosis not present

## 2015-05-11 DIAGNOSIS — R7301 Impaired fasting glucose: Secondary | ICD-10-CM | POA: Diagnosis not present

## 2015-05-11 DIAGNOSIS — I2510 Atherosclerotic heart disease of native coronary artery without angina pectoris: Secondary | ICD-10-CM | POA: Diagnosis not present

## 2015-05-11 NOTE — Progress Notes (Signed)
Patient ID: Brittany Figueroa, female   DOB: 07-17-1940, 74 y.o.   MRN: [id number]           Chief complaint:  ?  Diabetes  History of Present Illness:    She has been told by her PCP that she has type 2 diabetes based on an A1c of 6.5% in 07/2013 She has not had any other A1c is subsequently above normal, normal range and previous lab 6.2 or less She also has not had any abnormal blood sugars except a glucose of 125 in 3/16 She does have increased fasting blood sugars ranging from 108-125 since about 2012  She may have been toward about 10 years ago that she had prediabetes and reportedly had a normal glucose tolerance test. She has never been on medications and does not monitor her glucose  Over the last few years she has been generally able to keep her weight stable She says she has limited carbohydrates and no added sugar in her meals and drinks She usually is consuming lean meats She exercises fairly regularly especially recently with either being in cardiac rehabilitation are using her bike or treadmill Some of her walking abilities limited by bunions in her feet  She is here for determination of whether she has diabetes or not and further management Her last A1c in 10/16 was 5.8 and has been between 5.7-6.0 since 10/2013  Previous weight history: 139-143  Wt Readings from Last 3 Encounters:  05/11/15 145 lb (65.772 kg)  04/19/15 145 lb 12.8 oz (66.134 kg)  03/06/15 146 lb 1.6 oz (66.271 kg)    No results found for: HGBA1C Lab Results  Component Value Date   LDLCALC 97 12/13/2014   CREATININE 0.54 12/01/2013      Past Medical History  Diagnosis Date  . Complication of anesthesia 12/2009    "had endoscopy; larynx went into spasms; stopped breathing for 15 seconds" (05/13/2012)  . Hypercholesteremia   . Hypertension   . GERD (gastroesophageal reflux disease) 2011  . Abnormal finding on EKG, new anterolateral T-wave inversions  05/14/2012  . Abnormal nuclear stress test,  with infero-lateral ischemia 05/14/2012  . Atypical angina ([place]) 05/14/2012  . CAD (coronary artery disease), 05/13/12, with 80% LAD 05/14/2012  . S/P angioplasty with stent, LAD 05/13/12 05/14/2012  . Tuberculosis     test +- [place] [facility] - Dr. [name]- CXR- OK  . Arthritis     "right thumb; all my fingers" (05/13/2012), cervical spondylosis   . CMC arthritis, thumb, degenerative   . Cancer ([place])     basal cell on facial in L temporal region      Past Surgical History  Procedure Laterality Date  . Tonsillectomy and adenoidectomy  1950's  . Inguinal hernia repair  ~ 1966; 07/21/2002    "right; left" (05/13/2012)  . Osteotomy and ulnar shortening  07/21/2002    "right" (05/13/2012)  . Forearm / wrist tendon lesion excision  ?11/2001    "right; did waver to try to get ulnar out of hole that it had cut" (05/13/2012  . Dilation and curettage of uterus  1960's; 1970's; 1980    "probably 3" (05/13/2012)  . Tubal ligation  1980  . Coronary angioplasty with stent placement  05/13/2012    DES to LAD; she has total RCA with left to rt coll. normal LV function done for positive nuc study  . Anterior cervical decomp/discectomy fusion N/A 12/13/2013    Procedure: ANTERIOR CERVICAL DECOMPRESSION/DISCECTOMY FUSION 3 LEVELS Cervical four/five,five/six,six/seven. Anterior  cervical disectomy and fusion with peek and plate ;  Surgeon: [name], MD;  Location: [facility];  Service: Neurosurgery;  Laterality: N/A;  . Left heart catheterization with coronary angiogram N/A 05/13/2012    Procedure: LEFT HEART CATHETERIZATION WITH CORONARY ANGIOGRAM;  Surgeon: [name], MD;  Location: [facility] CATH LAB;  Service: Cardiovascular;  Laterality: N/A;    Family History  Problem Relation Age of Onset  . Hypertension Mother   . Diabetes Mother   . Hyperlipidemia Father   . Heart disease Father   . Stroke Father   . Heart disease Sister   . Diabetes Sister   . Heart disease Brother   . Diabetes Maternal  Aunt     Social History:  reports that she quit smoking about 26 years ago. Her smoking use included Cigarettes. She has a 10 pack-year smoking history. She has never used smokeless tobacco. She reports that she drinks about 7.2 oz of alcohol per week. She reports that she does not use illicit drugs.  Allergies:  Allergies  Allergen Reactions  . Statins Other (See Comments)    "intermittent loss of circulation; hands and arms will go to sleep; feet will get cramps; fatigue" (05/13/2012).  This occurred with Lipitor, Zocor, Crestor 5 mg qd, and she thinks pravastatin as well  . Amlodipine Itching      Medication List       This list is accurate as of: 05/11/15 11:59 PM.  Always use your most recent med list.               acetaminophen 500 MG tablet  Commonly known as:  TYLENOL  Take 500 mg by mouth every 6 (six) hours as needed for mild pain or headache.     aspirin 81 MG tablet  Take 81 mg by mouth daily.     calcium-vitamin D 500-200 MG-UNIT tablet  Commonly known as:  OSCAL WITH D  Take 2 tablets by mouth daily.     carvedilol 6.25 MG tablet  Commonly known as:  COREG  Take 6.25 mg by mouth 2 (two) times daily with a meal.     clopidogrel 75 MG tablet  Commonly known as:  PLAVIX  TAKE 1 TABLET BY MOUTH EVERY DAY     diclofenac sodium 1 % Gel  Commonly known as:  VOLTAREN  Apply 2 g topically daily as needed. For pain     ezetimibe 10 MG tablet  Commonly known as:  ZETIA  Take 10 mg by mouth daily.     hydrocortisone valerate cream 0.2 %  Commonly known as:  WESTCORT  APPLY ON THE SKIN BID AS NEEDED     irbesartan 150 MG tablet  Commonly known as:  AVAPRO  TAKE 1 TABLET BY MOUTH EVERY DAY     multivitamin with minerals Tabs tablet  Take 1 tablet by mouth daily.     NITROSTAT 0.4 MG SL tablet  Generic drug:  nitroGLYCERIN  PLACE 1 TABLET UNDER THE TONGUE EVERY 5 MINTUES AS NEEDED FOR CHEST PAIN     pravastatin 20 MG tablet  Commonly known as:   PRAVACHOL  Take 1 tablet (20 mg total) by mouth every evening.     Vitamin D3 2000 UNITS Tabs  Take 2,000 Units by mouth 2 (two) times daily.        LABS:  No visits with results within 1 Week(s) from this visit. Latest known visit with results is:  Office Visit on  10/17/2014  Component Date Value Ref Range Status  . LDL Particle Number 12/13/2014 1532* <1000 nmol/L Final   Comment:                           Low                   < 1000                           Moderate         1000 - 1299                           Borderline-High  1300 - 1599                           High             1600 - 2000                           Very High             > 2000   . LDL (calc) 12/13/2014 97  0 - 99 mg/dL Final   Comment:                           Optimal               <  100                           Above optimal     100 -  129                           Borderline        130 -  159                           High              160 -  189                           Very high             >  189 LDL-C is inaccurate if patient is non-fasting.   Marland Kitchen HDL-C 12/13/2014 50  >39 mg/dL Final  . Triglycerides 12/13/2014 123  0 - 149 mg/dL Final  . Cholesterol, Total 12/13/2014 172  100 - 199 mg/dL Final  . HDL Particle Number 12/13/2014 33.0  >=30.5 umol/L Final  . Large HDL-P 12/13/2014 4.0* >=4.8 umol/L Final  . Large VLDL-P 12/13/2014 4.9* <=2.7 nmol/L Final  . Small LDL Particle Number 12/13/2014 907* <=527 nmol/L Final  . LDL Size 12/13/2014 20.5  >=20.8 nm Final  . HDL Size 12/13/2014 8.8* >=9.2 nm Final  . VLDL Size 12/13/2014 56.4* <=46.6 nm Final  . LP-IR Score 12/13/2014 73* <=45 Final   Comment:  ----------------------------------------------------------              INSULIN RESISTANCE / DIABETES RISK MARKERS            <--Insulin Sensitive  Insulin Resistant-->                       Percentile in Reference Population   Large VLDL-P      Low     25th     50th     75th     High                      <0.9    0.9      2.7      6.9      >6.9   Small LDL-P       Low     25th     50th     75th     High                     <117    117      527      839      >839   Large HDL-P       High    75th     50th     25th     Low                     >7.3    7.3      4.8      3.1      <3.1   VLDL Size         Small   25th     50th     75th     Large                     <42.4   42.4     46.6     52.5     >52.5   LDL Size          Large   75th     50th     25th     Small                     >21.2   21.2     20.8     20.4     <20.4   HDL Size          Large   75th     50th     25th     Small                     >9.6    9.6      9.2      8.9      <8.9   Insulin Resistance Score                             LP-IR SCORE       Low     25th     50th     75th     High                     <27     27       45       63       >63    ________________________________________________________ LP-IR Score is inaccurate if patient is non-fasting. The LP-IR score is a laboratory developed index that has been associated with insulin  resistance and diabetes risk and should be used as one component of a physician's clinical assessment. Neither the LP-IR score nor the subclasses listed above have been cleared by the [place] Food and Drug Administration.   . Total Bilirubin 12/13/2014 0.4  0.2 - 1.2 mg/dL Final  . Bilirubin, Direct 12/13/2014 0.1  <=0.2 mg/dL Final  . Indirect Bilirubin 12/13/2014 0.3  0.2 - 1.2 mg/dL Final  . Alkaline Phosphatase 12/13/2014 74  33 - 130 U/L Final  . AST 12/13/2014 17  10 - 35 U/L Final  . ALT 12/13/2014 23  6 - 29 U/L Final  . Total Protein 12/13/2014 6.5  6.1 - 8.1 g/dL Final  . Albumin 12/13/2014 4.0  3.6 - 5.1 g/dL Final   Comment: ** Please note change in unit of measure and reference range(s). **        REVIEW OF SYSTEMS:        Review of Systems  Constitutional: Negative for weight gain.  HENT: Positive for nasal congestion.        Has allergic rhinitis    Respiratory: Negative for shortness of breath.   Cardiovascular: Negative for chest pain, palpitations and leg swelling.  Gastrointestinal: Negative for vomiting and constipation.  Endocrine: Negative for fatigue, general weakness and polydipsia.  Musculoskeletal: Positive for joint pain.       Has pain in hands and bunions of feet  Skin: Negative for rash.       History of toenail fungus  Neurological: Negative for numbness and tingling.  Psychiatric/Behavioral:       History of anxiety   Other active medical issues include:  Hypertension, treated with Avapro  History of osteopenia  History of hyperlipidemia with intolerance to statins, currently on pravastatin and Zetia   PHYSICAL EXAM:  BP 128/78 mmHg  Pulse 70  Temp(Src) 97.6 F (36.4 C)  Resp 14  Ht 5\' 4"  (1.626 m)  Wt 145 lb (65.772 kg)  BMI 24.88 kg/m2  SpO2 97%  GENERAL: Mildly generalized obesity present  No pallor, clubbing, lymphadenopathy or edema.  Skin:  no rash or pigmentation.  EYES:  Externally normal.  Fundii:  normal discs and vessels.  ENT: Oral mucosa and tongue normal.  THYROID:  Not palpable.  HEART:  Normal  S1 and S2; no murmur or click.  CHEST:  Normal shape.  Lungs: Vescicular breath sounds heard equally.  No crepitations/ wheeze.  ABDOMEN:  No distention.  Liver and spleen not palpable.  No other mass or tenderness.  NEUROLOGICAL: .Reflexes are 2+ bilaterally at ankles.  Monofilament sensation normal and toes.  Vibration sense mildly reduced and toes  JOINTS:  Normal.   ASSESSMENT:    Prediabetes: She does have impaired fasting glucose but not clear if she has diabetes or glucose intolerance.  A1c has generally been in the upper normal range compatible with prediabetes.  She probably does have metabolic syndrome since she already has mixed hyperlipidemia and history of hypertension.   PLAN:    Glucose tolerance test  to evaluate whether she has diabetes or impaired glucose  intolerance  in addition.  She may be a candidate for metformin if she has significant glucose intolerance  Encouraged her to continue on healthy diet and regular exercise which she is motivated to do  [facility] 05/13/2015, 1:24 PM

## 2015-05-13 DIAGNOSIS — R7303 Prediabetes: Secondary | ICD-10-CM | POA: Insufficient documentation

## 2015-05-23 ENCOUNTER — Other Ambulatory Visit: Payer: Medicare Other

## 2015-05-23 DIAGNOSIS — R7301 Impaired fasting glucose: Secondary | ICD-10-CM

## 2015-05-23 LAB — GLUCOSE TOLERANCE, 2 HOURS
Glucose, 1 Hour GTT: 192 mg/dL
Glucose, 2 hour: 192 mg/dL
Glucose, Fasting: 110 mg/dL — ABNORMAL HIGH (ref 70–99)

## 2015-05-25 ENCOUNTER — Other Ambulatory Visit: Payer: Self-pay | Admitting: Endocrinology

## 2015-05-25 ENCOUNTER — Other Ambulatory Visit: Payer: Self-pay | Admitting: *Deleted

## 2015-05-25 ENCOUNTER — Telehealth: Payer: Self-pay | Admitting: Cardiovascular Disease

## 2015-05-25 DIAGNOSIS — R7302 Impaired glucose tolerance (oral): Secondary | ICD-10-CM

## 2015-05-25 MED ORDER — METFORMIN HCL ER 500 MG PO TB24: ORAL_TABLET | ORAL | Status: DC

## 2015-05-25 NOTE — Telephone Encounter (Signed)
Pt reports occ SOB -"comes and goes". She does not assoc w/ exertion, certain activities, body positions. Notes sometimes she experiences sensation where she "just has to catch her breath" - req deep inhalation. No CP, fatigue, or dizziness. Denies palps.  She notes she started a new med trial - blind study for pempedoic acid - she is not sure if this would be SE or if she is taking the drug or placebo. Notes the symptoms of dyspnea have been ocurring off and on for ~3 weeks, the med trial only has been for past 1.5 weeks.  Pt amenable to seeing provider next week - sched to see Suanne Marker on 1/13. Advised if urgent changes to call or go to ED if symptoms warrant. Advised for any new symptoms to call.  Pt also reports indigestion/increased belching X~6 weeks. Advised may need to f/u w/ PCP regarding this - she has an appt w them next week also. Pt voiced thanks and understanding of instruction.

## 2015-05-25 NOTE — Telephone Encounter (Signed)
Brittany Figueroa is calling because for pat 2 1/2 wks she has been having some shortness of breath and some indigestion and wanted to speak w/ a nurse . Please call   Thanks

## 2015-05-29 DIAGNOSIS — M9903 Segmental and somatic dysfunction of lumbar region: Secondary | ICD-10-CM | POA: Diagnosis not present

## 2015-05-29 DIAGNOSIS — M9905 Segmental and somatic dysfunction of pelvic region: Secondary | ICD-10-CM | POA: Diagnosis not present

## 2015-05-29 DIAGNOSIS — S338XXA Sprain of other parts of lumbar spine and pelvis, initial encounter: Secondary | ICD-10-CM | POA: Diagnosis not present

## 2015-05-29 DIAGNOSIS — S39012A Strain of muscle, fascia and tendon of lower back, initial encounter: Secondary | ICD-10-CM | POA: Diagnosis not present

## 2015-05-29 DIAGNOSIS — S29012A Strain of muscle and tendon of back wall of thorax, initial encounter: Secondary | ICD-10-CM | POA: Diagnosis not present

## 2015-05-29 DIAGNOSIS — M9902 Segmental and somatic dysfunction of thoracic region: Secondary | ICD-10-CM | POA: Diagnosis not present

## 2015-06-01 ENCOUNTER — Ambulatory Visit (INDEPENDENT_AMBULATORY_CARE_PROVIDER_SITE_OTHER): Payer: Medicare Other | Admitting: Physician Assistant

## 2015-06-01 ENCOUNTER — Encounter: Payer: Self-pay | Admitting: Physician Assistant

## 2015-06-01 VITALS — BP 146/86 | HR 81 | Ht 61.75 in | Wt 146.6 lb

## 2015-06-01 DIAGNOSIS — E785 Hyperlipidemia, unspecified: Secondary | ICD-10-CM | POA: Diagnosis not present

## 2015-06-01 DIAGNOSIS — R0600 Dyspnea, unspecified: Secondary | ICD-10-CM

## 2015-06-01 DIAGNOSIS — R0609 Other forms of dyspnea: Secondary | ICD-10-CM | POA: Diagnosis not present

## 2015-06-01 MED ORDER — EZETIMIBE 10 MG PO TABS: 10.0000 mg | ORAL_TABLET | Freq: Every day | ORAL | Status: DC

## 2015-06-01 NOTE — Patient Instructions (Addendum)
Your physician has requested that you have a lexiscan myoview. For further information please visit [email or web address]. Please follow instruction sheet, as given.   Your physician recommends that you schedule a follow-up appointment with Dr Gwenlyn Found first available.  You will be notified of your test results.

## 2015-06-01 NOTE — Progress Notes (Signed)
Cardiology Office Note   Date:  06/01/2015   ID:  Brittany Figueroa, DOB [birth year], MRN [id number]  PCP:  Thressa Sheller, MD  Cardiologist:  Dr Stacy Gardner, PA-C   No chief complaint on file.   History of Present Illness: Brittany Figueroa is a 75 y.o. female with a history of HTN, HL, LAD stent 2013 w/ RCA 100%, GERD  Brittany Figueroa presents for evaluation of dyspnea on exertion.  Brittany Figueroa exercises regularly. She has multiple musculoskeletal issues, but still tries to exercise regularly. She will get on an exercise machine of one sort or another for 30-45 minutes. She has not had any chest pain or new dyspnea on exertion when she does this.  However, at intermittent times in the last 3-4 weeks she has had dyspnea. It generally starts with a mild level of activity but is not consistent. It occurred once while she was sitting still. It is brief, and she has to take a few deep breaths to get better. She wonders if her medications are causing this. She had an episode while she was in the office and I was able to check her heart rate, which was normal. She has no indication of hypoxia. She has no significant risk factors for PE.   Past Medical History  Diagnosis Date  . Complication of anesthesia 12/2009    "had endoscopy; larynx went into spasms; stopped breathing for 15 seconds" (05/13/2012)  . Hypercholesteremia   . Hypertension   . GERD (gastroesophageal reflux disease) 2011  . Abnormal finding on EKG, new anterolateral T-wave inversions  05/14/2012  . Abnormal nuclear stress test, with infero-lateral ischemia 05/14/2012  . Atypical angina ([place]) 05/14/2012  . CAD (coronary artery disease), 05/13/12, with 80% LAD 05/14/2012  . S/P angioplasty with stent, LAD 05/13/12 05/14/2012  . Tuberculosis     test +- [place] [facility] - Dr. [name]- CXR- OK  . Arthritis     "right thumb; all my fingers" (05/13/2012), cervical spondylosis   . CMC arthritis, thumb, degenerative   . Cancer  ([place])     basal cell on facial in L temporal region      Past Surgical History  Procedure Laterality Date  . Tonsillectomy and adenoidectomy  1950's  . Inguinal hernia repair  ~ 1966; 07/21/2002    "right; left" (05/13/2012)  . Osteotomy and ulnar shortening  07/21/2002    "right" (05/13/2012)  . Forearm / wrist tendon lesion excision  ?11/2001    "right; did waver to try to get ulnar out of hole that it had cut" (05/13/2012  . Dilation and curettage of uterus  1960's; 1970's; 1980    "probably 3" (05/13/2012)  . Tubal ligation  1980  . Coronary angioplasty with stent placement  05/13/2012    DES to LAD; she has total RCA with left to rt coll. normal LV function done for positive nuc study  . Anterior cervical decomp/discectomy fusion N/A 12/13/2013    Procedure: ANTERIOR CERVICAL DECOMPRESSION/DISCECTOMY FUSION 3 LEVELS Cervical four/five,five/six,six/seven. Anterior cervical disectomy and fusion with peek and plate ;  Surgeon: [name], MD;  Location: [place] [facility];  Service: Neurosurgery;  Laterality: N/A;  . Left heart catheterization with coronary angiogram N/A 05/13/2012    Procedure: LEFT HEART CATHETERIZATION WITH CORONARY ANGIOGRAM;  Surgeon: [name], MD;  Location: [facility] CATH LAB;  Service: Cardiovascular;  Laterality: N/A;    Current Outpatient Prescriptions  Medication Sig Dispense Refill  . acetaminophen (TYLENOL) 500  MG tablet Take 500 mg by mouth every 6 (six) hours as needed for mild pain or headache.    Marland Kitchen aspirin 81 MG tablet Take 81 mg by mouth daily.    . calcium-vitamin D (OSCAL WITH D) 500-200 MG-UNIT per tablet Take 2 tablets by mouth daily.    . carvedilol (COREG) 6.25 MG tablet Take 6.25 mg by mouth 2 (two) times daily with a meal.     . Cholecalciferol (VITAMIN D3) 2000 UNITS TABS Take 2,000 Units by mouth 2 (two) times daily.    . clopidogrel (PLAVIX) 75 MG tablet Take 75 mg by mouth daily.    . diclofenac sodium (VOLTAREN) 1 % GEL Apply 2 g topically  daily as needed. For pain    . ezetimibe (ZETIA) 10 MG tablet Take 10 mg by mouth daily.    . hydrocortisone valerate cream (WESTCORT) 0.2 % Apply 1 application topically daily as needed (ITCHING).    . Investigational - Study Medication Take 1 tablet by mouth daily. Bempedoic Acid 180 mg Tablet or Placebo Tablet for Oral Use    . irbesartan (AVAPRO) 150 MG tablet TAKE 1 TABLET BY MOUTH EVERY DAY 30 tablet 6  . metFORMIN (GLUCOPHAGE-XR) 500 MG 24 hr tablet Take 500 mg by mouth 2 (two) times daily.    . Multiple Vitamin (MULTIVITAMIN WITH MINERALS) TABS Take 1 tablet by mouth daily.    Marland Kitchen NITROSTAT 0.4 MG SL tablet PLACE 1 TABLET UNDER THE TONGUE EVERY 5 MINTUES AS NEEDED FOR CHEST PAIN 25 tablet 2  . pravastatin (PRAVACHOL) 20 MG tablet Take 1 tablet (20 mg total) by mouth every evening. 30 tablet 3   No current facility-administered medications for this visit.    Allergies:   Statins and Amlodipine    Social History:  The patient  reports that she quit smoking about 26 years ago. Her smoking use included Cigarettes. She has a 10 pack-year smoking history. She has never used smokeless tobacco. She reports that she drinks about 7.2 oz of alcohol per week. She reports that she does not use illicit drugs.   Family History:  The patient's family history includes Diabetes in her maternal aunt, mother, and sister; Heart disease in her brother, father, and sister; Hyperlipidemia in her father; Hypertension in her mother; Stroke in her father.    ROS:  Please see the history of present illness. All other systems are reviewed and negative.    PHYSICAL EXAM: VS:  BP 146/86 mmHg  Pulse 81  Ht 5' 1.75" (1.568 m)  Wt 146 lb 9.6 oz (66.497 kg)  BMI 27.05 kg/m2 , BMI Body mass index is 27.05 kg/(m^2). GEN: Well nourished, well developed, female in no acute distress HEENT: normal for age  Neck: no JVD, no carotid bruit, no masses Cardiac: RRR; no murmur, no rubs, or gallops Respiratory:  clear to  auscultation bilaterally, normal work of breathing GI: soft, nontender, nondistended, + BS MS: no deformity or atrophy; no edema; distal pulses are 2+ in all 4 extremities  Skin: warm and dry, no rash Neuro:  Strength and sensation are intact Psych: euthymic mood, full affect   EKG:  EKG is ordered today. ECG demonstrates sinus rhythm with left anterior fascicular block that is unchanged from previous ECGs.  Recent Labs: 12/13/2014: ALT 23    Lipid Panel    Component Value Date/Time   CHOL 172 12/13/2014 0856   CHOL 195 08/07/2014 0814   CHOL 275* 09/14/2013 0755   TRIG 123 12/13/2014  0856   TRIG 164.0* 08/07/2014 0814   HDL 50 12/13/2014 0856   HDL 46.30 08/07/2014 0814   HDL 46 09/14/2013 0755   CHOLHDL 4 08/07/2014 0814   CHOLHDL 6.0* 09/14/2013 0755   VLDL 32.8 08/07/2014 0814   LDLCALC 97 12/13/2014 0856   LDLCALC 116* 08/07/2014 0814   LDLCALC 192* 09/14/2013 0755   LDLDIRECT 130.1 04/04/2014 0844     Wt Readings from Last 3 Encounters:  06/01/15 146 lb 9.6 oz (66.497 kg)  05/11/15 145 lb (65.772 kg)  04/19/15 145 lb 12.8 oz (66.134 kg)     Other studies Reviewed: Additional studies/ records that were reviewed today include: Office Notes and other records.  ASSESSMENT AND PLAN:  1.  Dyspnea on exertion: She is not on any obvious medications that could cause this. She was recently started on a study medication for her cholesterol, but the symptoms began before she was enrolled in the study. She does not have them with a higher level of exertion, but she is concerned about them.  I advised the patient she was not on any medications that I was aware of were known to cause shortness of breath. I advised her that because of her chronically occluded RCA, it would be possible for her to have some angina but this was not clearly what was happening. However, to make sure that her stent is still patent and she has not developed other disease, we can do a Myoview to evaluate  for ischemia.  2. Hyperlipidemia: She is currently in a study which compares bempedoic 180 mg vs placebo for her familial hyper-lipidemia, and addition to the Zetia and Pravachol she is currently taking. She requests a 90 day prescription for Zetia, which we will do. Follow-up with her physician in [facility] who is managing this.   Current medicines are reviewed at length with the patient today.  The patient has concerns regarding medicines. Concerns were addressed  The following changes have been made:  no change  Labs/ tests ordered today include:   Lexi scan Myoview ECG   Disposition:   FU with Dr. Gwenlyn Found  Signed, Lenoard Aden  06/01/2015 11:23 AM    [address], [place], [place]  [address] Phone: [phone]; Fax: [phone]

## 2015-06-05 ENCOUNTER — Telehealth: Payer: Self-pay | Admitting: Endocrinology

## 2015-06-05 DIAGNOSIS — M9902 Segmental and somatic dysfunction of thoracic region: Secondary | ICD-10-CM | POA: Diagnosis not present

## 2015-06-05 DIAGNOSIS — S39012A Strain of muscle, fascia and tendon of lower back, initial encounter: Secondary | ICD-10-CM | POA: Diagnosis not present

## 2015-06-05 DIAGNOSIS — S338XXA Sprain of other parts of lumbar spine and pelvis, initial encounter: Secondary | ICD-10-CM | POA: Diagnosis not present

## 2015-06-05 DIAGNOSIS — S29012A Strain of muscle and tendon of back wall of thorax, initial encounter: Secondary | ICD-10-CM | POA: Diagnosis not present

## 2015-06-05 DIAGNOSIS — M9903 Segmental and somatic dysfunction of lumbar region: Secondary | ICD-10-CM | POA: Diagnosis not present

## 2015-06-05 DIAGNOSIS — M9905 Segmental and somatic dysfunction of pelvic region: Secondary | ICD-10-CM | POA: Diagnosis not present

## 2015-06-05 NOTE — Telephone Encounter (Signed)
Noted, patient is aware. 

## 2015-06-05 NOTE — Telephone Encounter (Signed)
Pt increased to two metformin tabs daily on Saturday and last night she was unstable, was staggering, slept very deep and did not hear her alarm

## 2015-06-05 NOTE — Telephone Encounter (Signed)
Please read message below.  

## 2015-06-05 NOTE — Telephone Encounter (Signed)
Dr. Cruzita Lederer,   Could you please advise in Dr. Ronnie Derby absence.

## 2015-06-05 NOTE — Telephone Encounter (Signed)
Go back to 1 tablet at night for few days then attempt to increase again.

## 2015-06-06 DIAGNOSIS — E785 Hyperlipidemia, unspecified: Secondary | ICD-10-CM | POA: Diagnosis not present

## 2015-06-06 DIAGNOSIS — I10 Essential (primary) hypertension: Secondary | ICD-10-CM | POA: Diagnosis not present

## 2015-06-06 DIAGNOSIS — R739 Hyperglycemia, unspecified: Secondary | ICD-10-CM | POA: Diagnosis not present

## 2015-06-08 ENCOUNTER — Telehealth: Payer: Self-pay

## 2015-06-08 DIAGNOSIS — S338XXA Sprain of other parts of lumbar spine and pelvis, initial encounter: Secondary | ICD-10-CM | POA: Diagnosis not present

## 2015-06-08 DIAGNOSIS — S39012A Strain of muscle, fascia and tendon of lower back, initial encounter: Secondary | ICD-10-CM | POA: Diagnosis not present

## 2015-06-08 DIAGNOSIS — S29012A Strain of muscle and tendon of back wall of thorax, initial encounter: Secondary | ICD-10-CM | POA: Diagnosis not present

## 2015-06-08 DIAGNOSIS — M9903 Segmental and somatic dysfunction of lumbar region: Secondary | ICD-10-CM | POA: Diagnosis not present

## 2015-06-08 DIAGNOSIS — M9902 Segmental and somatic dysfunction of thoracic region: Secondary | ICD-10-CM | POA: Diagnosis not present

## 2015-06-08 DIAGNOSIS — M9905 Segmental and somatic dysfunction of pelvic region: Secondary | ICD-10-CM | POA: Diagnosis not present

## 2015-06-08 NOTE — Telephone Encounter (Signed)
PA completed via CoverMyMeds for pt Ezetimibe Key [id number]

## 2015-06-11 ENCOUNTER — Other Ambulatory Visit: Payer: Self-pay

## 2015-06-11 MED ORDER — EZETIMIBE 10 MG PO TABS: 10.0000 mg | ORAL_TABLET | Freq: Every day | ORAL | Status: DC

## 2015-06-11 NOTE — Telephone Encounter (Signed)
PA denied for generic of medication but will pay for brand name.  Pt aware as she recieved letter in the mail over the weekend.  RX updated and sent to pharmacy for 30 day supply.  Pt verbalized understanding no questions at this time.

## 2015-06-11 NOTE — Telephone Encounter (Signed)
This encounter was created in error - please disregard.

## 2015-06-12 ENCOUNTER — Telehealth (HOSPITAL_COMMUNITY): Payer: Self-pay

## 2015-06-12 NOTE — Telephone Encounter (Signed)
Encounter complete. 

## 2015-06-13 DIAGNOSIS — M9905 Segmental and somatic dysfunction of pelvic region: Secondary | ICD-10-CM | POA: Diagnosis not present

## 2015-06-13 DIAGNOSIS — S39012A Strain of muscle, fascia and tendon of lower back, initial encounter: Secondary | ICD-10-CM | POA: Diagnosis not present

## 2015-06-13 DIAGNOSIS — S29012A Strain of muscle and tendon of back wall of thorax, initial encounter: Secondary | ICD-10-CM | POA: Diagnosis not present

## 2015-06-13 DIAGNOSIS — S338XXA Sprain of other parts of lumbar spine and pelvis, initial encounter: Secondary | ICD-10-CM | POA: Diagnosis not present

## 2015-06-13 DIAGNOSIS — M9903 Segmental and somatic dysfunction of lumbar region: Secondary | ICD-10-CM | POA: Diagnosis not present

## 2015-06-13 DIAGNOSIS — M9902 Segmental and somatic dysfunction of thoracic region: Secondary | ICD-10-CM | POA: Diagnosis not present

## 2015-06-14 ENCOUNTER — Other Ambulatory Visit: Payer: Self-pay | Admitting: Pharmacist Clinician (PhC)/ Clinical Pharmacy Specialist

## 2015-06-14 ENCOUNTER — Ambulatory Visit (HOSPITAL_COMMUNITY)
Admission: RE | Admit: 2015-06-14 | Discharge: 2015-06-14 | Disposition: A | Discharge status: Home / Self Care | Payer: Medicare Other | Source: Ambulatory Visit | Attending: Internal Medicine | Admitting: Internal Medicine

## 2015-06-14 DIAGNOSIS — Z8249 Family history of ischemic heart disease and other diseases of the circulatory system: Secondary | ICD-10-CM | POA: Insufficient documentation

## 2015-06-14 DIAGNOSIS — I10 Essential (primary) hypertension: Secondary | ICD-10-CM | POA: Insufficient documentation

## 2015-06-14 DIAGNOSIS — Z87891 Personal history of nicotine dependence: Secondary | ICD-10-CM | POA: Insufficient documentation

## 2015-06-14 DIAGNOSIS — R0602 Shortness of breath: Secondary | ICD-10-CM | POA: Insufficient documentation

## 2015-06-14 DIAGNOSIS — R0600 Dyspnea, unspecified: Secondary | ICD-10-CM

## 2015-06-14 DIAGNOSIS — R9439 Abnormal result of other cardiovascular function study: Secondary | ICD-10-CM | POA: Diagnosis not present

## 2015-06-14 DIAGNOSIS — R0609 Other forms of dyspnea: Secondary | ICD-10-CM | POA: Diagnosis not present

## 2015-06-14 DIAGNOSIS — R079 Chest pain, unspecified: Secondary | ICD-10-CM | POA: Diagnosis not present

## 2015-06-14 MED ORDER — ZETIA 10 MG PO TABS: 10.0000 mg | ORAL_TABLET | Freq: Every day | ORAL | Status: DC

## 2015-06-14 MED ORDER — TECHNETIUM TC 99M SESTAMIBI GENERIC - CARDIOLITE
30.9000 | Freq: Once | INTRAVENOUS | Status: AC | PRN
  Administered 2015-06-14: 30.9 via INTRAVENOUS

## 2015-06-14 MED ORDER — AMINOPHYLLINE 25 MG/ML IV SOLN
75.0000 mg | Freq: Once | INTRAVENOUS | Status: AC
  Administered 2015-06-14: 75 mg via INTRAVENOUS

## 2015-06-14 MED ORDER — REGADENOSON 0.4 MG/5ML IV SOLN
0.4000 mg | Freq: Once | INTRAVENOUS | Status: AC
  Administered 2015-06-14: 0.4 mg via INTRAVENOUS

## 2015-06-14 MED ORDER — TECHNETIUM TC 99M SESTAMIBI GENERIC - CARDIOLITE
10.6000 | Freq: Once | INTRAVENOUS | Status: AC | PRN
  Administered 2015-06-14: 10.6 via INTRAVENOUS

## 2015-06-15 DIAGNOSIS — S29012A Strain of muscle and tendon of back wall of thorax, initial encounter: Secondary | ICD-10-CM | POA: Diagnosis not present

## 2015-06-15 DIAGNOSIS — M9902 Segmental and somatic dysfunction of thoracic region: Secondary | ICD-10-CM | POA: Diagnosis not present

## 2015-06-15 DIAGNOSIS — M9905 Segmental and somatic dysfunction of pelvic region: Secondary | ICD-10-CM | POA: Diagnosis not present

## 2015-06-15 DIAGNOSIS — M9903 Segmental and somatic dysfunction of lumbar region: Secondary | ICD-10-CM | POA: Diagnosis not present

## 2015-06-15 DIAGNOSIS — S39012A Strain of muscle, fascia and tendon of lower back, initial encounter: Secondary | ICD-10-CM | POA: Diagnosis not present

## 2015-06-15 DIAGNOSIS — S338XXA Sprain of other parts of lumbar spine and pelvis, initial encounter: Secondary | ICD-10-CM | POA: Diagnosis not present

## 2015-06-15 LAB — MYOCARDIAL PERFUSION IMAGING
LV dias vol: 72 mL
LV sys vol: 28 mL
Peak HR: 96 {beats}/min
Rest HR: 63 {beats}/min
SDS: 7
SRS: 10
SSS: 17
TID: 1.17

## 2015-06-18 ENCOUNTER — Telehealth: Payer: Self-pay | Admitting: Cardiovascular Disease

## 2015-06-18 NOTE — Telephone Encounter (Signed)
Left message to call back  

## 2015-06-18 NOTE — Telephone Encounter (Signed)
New message ° ° °Patient returning call back to nurse.  °

## 2015-06-18 NOTE — Telephone Encounter (Signed)
Pt aware of results 

## 2015-06-20 ENCOUNTER — Other Ambulatory Visit: Payer: Medicare Other

## 2015-06-21 ENCOUNTER — Other Ambulatory Visit: Payer: Medicare Other

## 2015-06-21 DIAGNOSIS — M9903 Segmental and somatic dysfunction of lumbar region: Secondary | ICD-10-CM | POA: Diagnosis not present

## 2015-06-21 DIAGNOSIS — M9902 Segmental and somatic dysfunction of thoracic region: Secondary | ICD-10-CM | POA: Diagnosis not present

## 2015-06-21 DIAGNOSIS — M9905 Segmental and somatic dysfunction of pelvic region: Secondary | ICD-10-CM | POA: Diagnosis not present

## 2015-06-21 DIAGNOSIS — S338XXA Sprain of other parts of lumbar spine and pelvis, initial encounter: Secondary | ICD-10-CM | POA: Diagnosis not present

## 2015-06-21 DIAGNOSIS — S29012A Strain of muscle and tendon of back wall of thorax, initial encounter: Secondary | ICD-10-CM | POA: Diagnosis not present

## 2015-06-21 DIAGNOSIS — S39012A Strain of muscle, fascia and tendon of lower back, initial encounter: Secondary | ICD-10-CM | POA: Diagnosis not present

## 2015-06-21 DIAGNOSIS — R7302 Impaired glucose tolerance (oral): Secondary | ICD-10-CM | POA: Diagnosis not present

## 2015-06-22 LAB — GLUCOSE, RANDOM: Glucose: 111 mg/dL — ABNORMAL HIGH (ref 65–99)

## 2015-06-22 LAB — FRUCTOSAMINE: Fructosamine: 242 umol/L (ref 0–285)

## 2015-06-27 ENCOUNTER — Encounter: Payer: Self-pay | Admitting: Cardiovascular Disease

## 2015-06-27 ENCOUNTER — Ambulatory Visit
Admission: RE | Admit: 2015-06-27 | Discharge: 2015-06-27 | Disposition: A | Discharge status: Home / Self Care | Payer: Medicare Other | Source: Ambulatory Visit | Attending: Cardiovascular Disease | Admitting: Cardiovascular Disease

## 2015-06-27 ENCOUNTER — Encounter: Payer: Self-pay | Admitting: Endocrinology

## 2015-06-27 ENCOUNTER — Ambulatory Visit (INDEPENDENT_AMBULATORY_CARE_PROVIDER_SITE_OTHER): Payer: Medicare Other | Admitting: Endocrinology

## 2015-06-27 ENCOUNTER — Ambulatory Visit (INDEPENDENT_AMBULATORY_CARE_PROVIDER_SITE_OTHER): Payer: Medicare Other | Admitting: Cardiovascular Disease

## 2015-06-27 VITALS — BP 160/88 | HR 82 | Ht 61.0 in | Wt 145.2 lb

## 2015-06-27 VITALS — BP 138/84 | HR 90 | Temp 97.8°F | Resp 14 | Ht 61.0 in | Wt 147.6 lb

## 2015-06-27 DIAGNOSIS — R9439 Abnormal result of other cardiovascular function study: Secondary | ICD-10-CM

## 2015-06-27 DIAGNOSIS — Z01810 Encounter for preprocedural cardiovascular examination: Secondary | ICD-10-CM | POA: Diagnosis not present

## 2015-06-27 DIAGNOSIS — R7301 Impaired fasting glucose: Secondary | ICD-10-CM

## 2015-06-27 DIAGNOSIS — R931 Abnormal findings on diagnostic imaging of heart and coronary circulation: Secondary | ICD-10-CM

## 2015-06-27 DIAGNOSIS — E785 Hyperlipidemia, unspecified: Secondary | ICD-10-CM | POA: Diagnosis not present

## 2015-06-27 DIAGNOSIS — R5383 Other fatigue: Secondary | ICD-10-CM | POA: Diagnosis not present

## 2015-06-27 DIAGNOSIS — I2583 Coronary atherosclerosis due to lipid rich plaque: Principal | ICD-10-CM

## 2015-06-27 DIAGNOSIS — Z5181 Encounter for therapeutic drug level monitoring: Secondary | ICD-10-CM

## 2015-06-27 DIAGNOSIS — I2510 Atherosclerotic heart disease of native coronary artery without angina pectoris: Secondary | ICD-10-CM

## 2015-06-27 DIAGNOSIS — I10 Essential (primary) hypertension: Secondary | ICD-10-CM | POA: Diagnosis not present

## 2015-06-27 DIAGNOSIS — D689 Coagulation defect, unspecified: Secondary | ICD-10-CM | POA: Diagnosis not present

## 2015-06-27 DIAGNOSIS — R0602 Shortness of breath: Secondary | ICD-10-CM | POA: Diagnosis not present

## 2015-06-27 LAB — PROTIME-INR
INR: 0.95
Prothrombin Time: 12.8 s (ref 11.6–15.2)

## 2015-06-27 LAB — CBC
HCT: 40.3 % (ref 36.0–46.0)
Hemoglobin: 13.6 g/dL (ref 12.0–15.0)
MCH: 30.5 pg (ref 26.0–34.0)
MCHC: 33.7 g/dL (ref 30.0–36.0)
MCV: 90.4 fL (ref 78.0–100.0)
MPV: 9.2 fL (ref 8.6–12.4)
Platelets: 383 10*3/uL (ref 150–400)
RBC: 4.46 MIL/uL (ref 3.87–5.11)
RDW: 13 % (ref 11.5–15.5)
WBC: 6.1 10*3/uL (ref 4.0–10.5)

## 2015-06-27 LAB — BASIC METABOLIC PANEL WITH GFR
BUN: 16 mg/dL (ref 7–25)
CO2: 24 mmol/L (ref 20–31)
Calcium: 9.8 mg/dL (ref 8.6–10.4)
Chloride: 101 mmol/L (ref 98–110)
Creat: 0.57 mg/dL — ABNORMAL LOW (ref 0.60–0.93)
Glucose, Bld: 110 mg/dL — ABNORMAL HIGH (ref 65–99)
Potassium: 4.4 mmol/L (ref 3.5–5.3)
Sodium: 134 mmol/L — ABNORMAL LOW (ref 135–146)

## 2015-06-27 LAB — APTT: aPTT: 38 seconds — ABNORMAL HIGH (ref 24–37)

## 2015-06-27 LAB — TSH: TSH: 2.72 m[IU]/L

## 2015-06-27 MED ORDER — PRAVASTATIN SODIUM 40 MG PO TABS: 40.0000 mg | ORAL_TABLET | Freq: Every evening | ORAL | Status: DC

## 2015-06-27 NOTE — Assessment & Plan Note (Signed)
History of CAD status post proximal LAD stenting by myself 05/13/12 with a known occluded dominant RCA and left right collaterals. Her last Myoview performed in 2015 did not show significant inferior ischemia however because of increasing dyspnea on exertion a recent Myoview performed 06/14/15 did show significant inferior ischemia. I'm concerned that she may have had restenosis of her proximal LAD providing the collaterals to the occluded RCA and a saphenous either side to proceed with cardiac catheterization this coming Monday. A right radial approach.The patient understands that risks included but are not limited to stroke (1 in 1000), death (1 in 35), kidney failure [usually temporary] (1 in 500), bleeding (1 in 200), allergic reaction [possibly serious] (1 in 200). The patient understands and agrees to proceed

## 2015-06-27 NOTE — Patient Instructions (Signed)
Medication Instructions: Dr Gwenlyn Found has recommended making the following medication changes: INCREASE Pravastatin to 40 mg  Labwork: Your physician recommends that you return for lab work in 2 months - FASTING.  Testing/Procedures: 1. Your physician has requested that you have a cardiac catheterization on MONDAY, 07/02/2015. Cardiac catheterization is used to diagnose and/or treat various heart conditions. Doctors may recommend this procedure for a number of different reasons. The most common reason is to evaluate chest pain. Chest pain can be a symptom of coronary artery disease (CAD), and cardiac catheterization can show whether plaque is narrowing or blocking your heart's arteries. This procedure is also used to evaluate the valves, as well as measure the blood flow and oxygen levels in different parts of your heart. For further information please visit [email or web address].   Following your catheterization, you will not be allowed to drive for 3 days.  No lifting, pushing, or pulling greater that 10 pounds is allowed for 1 week.  You will be required to have the following tests prior to the procedure:  1. Blood work-the blood work can be done no more than 7 days prior to the procedure.  It can be done at any [facility] lab.  There is one downstairs on the first floor of this building and one in the [facility] building [phone]).  2. Chest Xray-the chest xray order has already been placed at the [facility].      Puncture site - right radial  Follow-up: Follow-up will be determine at discharge from the hospital  If you need a refill on your cardiac medications before your next appointment, please call your pharmacy.

## 2015-06-27 NOTE — Assessment & Plan Note (Signed)
History of hyperlipidemia on pravastatin 20 mg a day. She also is on a lipid-lowering study drug. She has had statin intolerance because of short-term memory loss. Apparently she is not at goal. I'm going to increase the pravastatin from 20-40 mg a day and will recheck a lipid and liver profile

## 2015-06-27 NOTE — Progress Notes (Signed)
Patient ID: Brittany Figueroa, female   DOB: 03-12-1941, 75 y.o.   MRN: [id number]           Chief complaint:  ?  Diabetes  History of Present Illness:    She has been told by her PCP that she has type 2 diabetes based on an A1c of 6.5% in 07/2013 She has not had any other A1c is subsequently above normal, normal range and previous lab 6.2 or less She also has not had any abnormal blood sugars except a glucose of 125 in 3/16  She does have increased fasting blood sugars ranging from 108-125 since about 2012  She may have been told about 10 years ago that she had prediabetes and reportedly had a normal glucose tolerance test. Over the last few years she has been generally able to keep her weight stable She says she has limited carbohydrates and no added sugar in her meals and drinks She usually is consuming lean meats  She exercises fairly regularly except when she has other intercurrent issues, currently getting cardiac evaluation Some of her walking abilities limited by bunions in her feet  Her last A1c in 1/17 was 6.1, previously in 10/16 was 5.8 and has been between 5.7-6.0 since 10/2013 She thinks her diet was not good over the holidays  GLUCOSE TOLERANCE test on 05/23/15: Fasting glucose 110, two-hour glucose 192  Since she had mild impaired glucose tolerance she was started on metformin ER 1000 mg daily which she is tolerating well since 06/02/15 Her glucose is 111 fasting, fructosamine normal  Previous weight history: 139-143  Wt Readings from Last 3 Encounters:  06/27/15 147 lb 9.6 oz (66.951 kg)  06/27/15 145 lb 3.2 oz (65.862 kg)  06/14/15 148 lb (67.132 kg)    No results found for: HGBA1C Lab Results  Component Value Date   LDLCALC 97 12/13/2014   CREATININE 0.54 12/01/2013      Past Medical History  Diagnosis Date  . Complication of anesthesia 12/2009    "had endoscopy; larynx went into spasms; stopped breathing for 15 seconds" (05/13/2012)  . Hypercholesteremia     . Hypertension   . GERD (gastroesophageal reflux disease) 2011  . Abnormal finding on EKG, new anterolateral T-wave inversions  05/14/2012  . Abnormal nuclear stress test, with infero-lateral ischemia 05/14/2012  . Atypical angina ([place]) 05/14/2012  . CAD (coronary artery disease), 05/13/12, with 80% LAD 05/14/2012  . S/P angioplasty with stent, LAD 05/13/12 05/14/2012  . Tuberculosis     test +- [place] [facility] - Dr. [name]- CXR- OK  . Arthritis     "right thumb; all my fingers" (05/13/2012), cervical spondylosis   . [facility]MC arthritis, thumb, degenerative   . Cancer ([place])     basal cell on facial in L temporal region      Past Surgical History  Procedure Laterality Date  . Tonsillectomy and adenoidectomy  1950's  . Inguinal hernia repair  ~ 1966; 07/21/2002    "right; left" (05/13/2012)  . Osteotomy and ulnar shortening  07/21/2002    "right" (05/13/2012)  . Forearm / wrist tendon lesion excision  ?11/2001    "right; did waver to try to get ulnar out of hole that it had cut" (05/13/2012  . Dilation and curettage of uterus  1960's; 1970's; 1980    "probably 3" (05/13/2012)  . Tubal ligation  1980  . Coronary angioplasty with stent placement  05/13/2012    DES to LAD; she has total RCA with left to rt  coll. normal LV function done for positive nuc study  . Anterior cervical decomp/discectomy fusion N/A 12/13/2013    Procedure: ANTERIOR CERVICAL DECOMPRESSION/DISCECTOMY FUSION 3 LEVELS Cervical four/five,five/six,six/seven. Anterior cervical disectomy and fusion with peek and plate ;  Surgeon: [name], MD;  Location: [facility];  Service: Neurosurgery;  Laterality: N/A;  . Left heart catheterization with coronary angiogram N/A 05/13/2012    Procedure: LEFT HEART CATHETERIZATION WITH CORONARY ANGIOGRAM;  Surgeon: [name], MD;  Location: [facility] CATH LAB;  Service: Cardiovascular;  Laterality: N/A;    Family History  Problem Relation Age of Onset  . Hypertension Mother   . Diabetes  Mother   . Hyperlipidemia Father   . Heart disease Father   . Stroke Father   . Heart disease Sister   . Diabetes Sister   . Heart disease Brother   . Diabetes Maternal Aunt     Social History:  reports that she quit smoking about 26 years ago. Her smoking use included Cigarettes. She has a 10 pack-year smoking history. She has never used smokeless tobacco. She reports that she drinks about 7.2 oz of alcohol per week. She reports that she does not use illicit drugs.  Allergies:  Allergies  Allergen Reactions  . Statins Other (See Comments)    "intermittent loss of circulation; hands and arms will go to sleep; feet will get cramps; fatigue" (05/13/2012).  This occurred with Lipitor, Zocor, Crestor 5 mg qd, and she thinks pravastatin as well  . Amlodipine Itching      Medication List       This list is accurate as of: 06/27/15  2:54 PM.  Always use your most recent med list.               acetaminophen 500 MG tablet  Commonly known as:  TYLENOL  Take 500 mg by mouth every 6 (six) hours as needed for mild pain or headache.     aspirin 81 MG tablet  Take 81 mg by mouth daily.     calcium-vitamin D 500-200 MG-UNIT tablet  Commonly known as:  OSCAL WITH D  Take 2 tablets by mouth daily.     carvedilol 6.25 MG tablet  Commonly known as:  COREG  Take 6.25 mg by mouth 2 (two) times daily with a meal.     clopidogrel 75 MG tablet  Commonly known as:  PLAVIX  Take 75 mg by mouth daily.     diclofenac sodium 1 % Gel  Commonly known as:  VOLTAREN  Apply 2 g topically daily as needed. For pain     hydrocortisone valerate cream 0.2 %  Commonly known as:  WESTCORT  Apply 1 application topically daily as needed (ITCHING).     Investigational - Study Medication  Take 1 tablet by mouth daily. Bempedoic Acid 180 mg Tablet or Placebo Tablet for Oral Use     irbesartan 150 MG tablet  Commonly known as:  AVAPRO  TAKE 1 TABLET BY MOUTH EVERY DAY     metFORMIN 500 MG 24 hr tablet   Commonly known as:  GLUCOPHAGE-XR  Take 500 mg by mouth 2 (two) times daily.     multivitamin with minerals Tabs tablet  Take 1 tablet by mouth daily.     NITROSTAT 0.4 MG SL tablet  Generic drug:  nitroGLYCERIN  PLACE 1 TABLET UNDER THE TONGUE EVERY 5 MINTUES AS NEEDED FOR CHEST PAIN     pravastatin 40 MG tablet  Commonly known  as:  PRAVACHOL  Take 1 tablet (40 mg total) by mouth every evening.     Vitamin D3 2000 units Tabs  Take 2,000 Units by mouth 2 (two) times daily.     ZETIA 10 MG tablet  Generic drug:  ezetimibe  Take 1 tablet (10 mg total) by mouth daily.        LABS:  Lab on 06/21/2015  Component Date Value Ref Range Status  . Glucose 06/21/2015 111* 65 - 99 mg/dL Final  . Fructosamine 06/21/2015 242  0 - 285 umol/L Final   Comment: Published reference interval for apparently healthy subjects between age 61 and 54 is 26 - 285 umol/L and in a poorly controlled diabetic population is 228 - 563 umol/L with a mean of 396 umol/L.      REVIEW OF SYSTEMS:        Review of Systems  Respiratory: Positive for shortness of breath.    Other active medical issues include:  Hypertension, treated with Avapro  History of osteopenia  History of hyperlipidemia with intolerance to statins, currently on pravastatin and Zetia   PHYSICAL EXAM:  BP 138/84 mmHg  Pulse 90  Temp(Src) 97.8 F (36.6 C)  Resp 14  Ht 5\' 1"  (1.549 m)  Wt 147 lb 9.6 oz (66.951 kg)  BMI 27.90 kg/m2  SpO2 97%     ASSESSMENT:    Prediabetes: She does have impaired fasting glucose and also impaired glucose tolerance Currently tolerating metformin low-dose but not clear if she is benefiting as her fasting glucose was about the same However has not been able to exercise much recently Last A1c was 6.1  PLAN:   No change in management as yet Recheck A1c and fasting glucose in 2 months Resume exercise when able to Continue trying to improve diet  Jaeline Whobrey 06/27/2015, 2:54 PM

## 2015-06-27 NOTE — Patient Instructions (Signed)
Dr Birdie Riddle

## 2015-06-27 NOTE — Assessment & Plan Note (Signed)
History of hypertension with blood pressure measures 160/88. She is on carvedilol and Avapro. Continue current meds at current dosing

## 2015-06-27 NOTE — Progress Notes (Signed)
06/27/2015 Brittany Figueroa   04-20-1941  [id number]  Primary Physician Thressa Sheller, MD Primary Cardiologist: [name] MD [name]   HPI:  The patient is a 75 year old mildly overweight divorced Caucasian female, mother to 2, grandmother to 3 grandchildren, who I last saw 10/17/14. She has a history of hypertension and hyperlipidemia. I saw her in December of 2013 with new anterolateral T-wave inversion and a Myoview that showed inferolateral ischemia that was new compared to a prior study. She did have some unusual symptoms at that time. Based on this, I catheterized her on May 13, 2012, revealing an 80% proximal LAD lesion, which I stented using a drug-eluting stent, as well as a total dominant RCA with left to right collaterals and normal LV function. Since stenting her LAD, she is ultimately asymptomatic. Her other problems include hypertension and hyperlipidemia. I have reviewed her blood pressures, which were recorded during cardiac rehab, which were all normal. Her recent blood work revealed a total cholesterol of 161, LDL of 89 and HDL of 40.she denies chest pain or shortness of breath. She had a recent Myoview stress test performed several months ago that showed ischemia in the RCA territory but otherwise unremarkable is explainable by her known total dominant RCA with left to right collaterals. Chief complaint of excruciating neck pain thought to be related to cervical disc disease. She was evaluated by Dr. Because of her neck pain demonstrated cervical disc disease requiring fusion. Because of this she will need to undergo pharmacologic Myoview stress testing to risk stratify her. She underwent cervical discectomy by Dr. Deri Fuelling with excellent clinical result. She no longer is in pain. Her major issue now is treatment of her hyperlipidemia. She is statin intolerant and complains of short-term memory loss. Ultimately, I think she will require P CSK9 monoclonal  injectable therapy although apparently her insurance will not cover enough to make it financially feasible. She saw Rosaria Ferries in the office approximately 2 weeks ago with increasing dyspnea on exertion. A Myoview stress test performed 06/15/15 showed significant ischemia in the RCA territory. This is new compared to her previous Myoview performed in 2015 and suggest the possibility of in-stent restenosis and/or a denovo  lesion in the LAD territory.   Current Outpatient Prescriptions  Medication Sig Dispense Refill  . acetaminophen (TYLENOL) 500 MG tablet Take 500 mg by mouth every 6 (six) hours as needed for mild pain or headache.    Marland Kitchen aspirin 81 MG tablet Take 81 mg by mouth daily.    . calcium-vitamin D (OSCAL WITH D) 500-200 MG-UNIT per tablet Take 2 tablets by mouth daily.    . carvedilol (COREG) 6.25 MG tablet Take 6.25 mg by mouth 2 (two) times daily with a meal.     . Cholecalciferol (VITAMIN D3) 2000 UNITS TABS Take 2,000 Units by mouth 2 (two) times daily.    . clopidogrel (PLAVIX) 75 MG tablet Take 75 mg by mouth daily.    . diclofenac sodium (VOLTAREN) 1 % GEL Apply 2 g topically daily as needed. For pain    . hydrocortisone valerate cream (WESTCORT) 0.2 % Apply 1 application topically daily as needed (ITCHING).    . Investigational - Study Medication Take 1 tablet by mouth daily. Bempedoic Acid 180 mg Tablet or Placebo Tablet for Oral Use    . irbesartan (AVAPRO) 150 MG tablet TAKE 1 TABLET BY MOUTH EVERY DAY 30 tablet 6  . metFORMIN (GLUCOPHAGE-XR) 500 MG 24 hr tablet Take  500 mg by mouth 2 (two) times daily.    . Multiple Vitamin (MULTIVITAMIN WITH MINERALS) TABS Take 1 tablet by mouth daily.    Marland Kitchen NITROSTAT 0.4 MG SL tablet PLACE 1 TABLET UNDER THE TONGUE EVERY 5 MINTUES AS NEEDED FOR CHEST PAIN 25 tablet 2  . pravastatin (PRAVACHOL) 20 MG tablet Take 1 tablet (20 mg total) by mouth every evening. 30 tablet 3  . ZETIA 10 MG tablet Take 1 tablet (10 mg total) by mouth daily. 30  tablet 6   No current facility-administered medications for this visit.    Allergies  Allergen Reactions  . Statins Other (See Comments)    "intermittent loss of circulation; hands and arms will go to sleep; feet will get cramps; fatigue" (05/13/2012).  This occurred with Lipitor, Zocor, Crestor 5 mg qd, and she thinks pravastatin as well  . Amlodipine Itching    Social History   Social History  . Marital Status: Divorced    Spouse Name: N/A  . Number of Children: N/A  . Years of Education: N/A   Occupational History  . Not on file.   Social History Main Topics  . Smoking status: Former Smoker -- 0.50 packs/day for 20 years    Types: Cigarettes    Quit date: 07/05/1988  . Smokeless tobacco: Never Used  . Alcohol Use: 7.2 oz/week    12 Glasses of wine per week     Comment: 05/13/2012 "6-8 oz glass of red wine q hs"   . Drug Use: No  . Sexual Activity: No   Other Topics Concern  . Not on file   Social History Narrative     Review of Systems: General: negative for chills, fever, night sweats or weight changes.  Cardiovascular: negative for chest pain, dyspnea on exertion, edema, orthopnea, palpitations, paroxysmal nocturnal dyspnea or shortness of breath Dermatological: negative for rash Respiratory: negative for cough or wheezing Urologic: negative for hematuria Abdominal: negative for nausea, vomiting, diarrhea, bright red blood per rectum, melena, or hematemesis Neurologic: negative for visual changes, syncope, or dizziness All other systems reviewed and are otherwise negative except as noted above.    Blood pressure 160/88, pulse 82, height 5\' 1"  (1.549 m), weight 145 lb 3.2 oz (65.862 kg).  General appearance: alert and no distress Neck: no adenopathy, no carotid bruit, no JVD, supple, symmetrical, trachea midline and thyroid not enlarged, symmetric, no tenderness/mass/nodules Lungs: clear to auscultation bilaterally Heart: regular rate and rhythm, S1, S2  normal, no murmur, click, rub or gallop Extremities: extremities normal, atraumatic, no cyanosis or edema  EKG not performed today  ASSESSMENT AND PLAN:   Hyperlipidemia History of hyperlipidemia on pravastatin 20 mg a day. She also is on a lipid-lowering study drug. She has had statin intolerance because of short-term memory loss. Apparently she is not at goal. I'm going to increase the pravastatin from 20-40 mg a day and will recheck a lipid and liver profile  HTN (hypertension) History of hypertension with blood pressure measures 160/88. She is on carvedilol and Avapro. Continue current meds at current dosing  CAD (coronary artery disease), 05/13/12, with 80% LAD and totally occluded RCA History of CAD status post proximal LAD stenting by myself 05/13/12 with a known occluded dominant RCA and left right collaterals. Her last Myoview performed in 2015 did not show significant inferior ischemia however because of increasing dyspnea on exertion a recent Myoview performed 06/14/15 did show significant inferior ischemia. I'm concerned that she may have had restenosis of her  proximal LAD providing the collaterals to the occluded RCA and a saphenous either side to proceed with cardiac catheterization this coming Monday. A right radial approach.The patient understands that risks included but are not limited to stroke (1 in 1000), death (1 in 19), kidney failure [usually temporary] (1 in 500), bleeding (1 in 200), allergic reaction [possibly serious] (1 in 200). The patient understands and agrees to proceed      [name] MD [facility], [facility] 06/27/2015 9:55 AM

## 2015-06-29 ENCOUNTER — Other Ambulatory Visit: Payer: Self-pay | Admitting: Cardiovascular Disease

## 2015-06-29 NOTE — Telephone Encounter (Signed)
Rx refill sent to pharmacy. 

## 2015-07-02 ENCOUNTER — Ambulatory Visit: Payer: Medicare Other | Admitting: Endocrinology

## 2015-07-02 ENCOUNTER — Encounter (HOSPITAL_COMMUNITY): Payer: Self-pay | Admitting: Cardiovascular Disease

## 2015-07-02 ENCOUNTER — Encounter (HOSPITAL_COMMUNITY)
Admission: RE | Disposition: A | Discharge status: Home / Self Care | Payer: Self-pay | Source: Ambulatory Visit | Attending: Cardiovascular Disease

## 2015-07-02 ENCOUNTER — Ambulatory Visit (HOSPITAL_COMMUNITY)
Admission: RE | Admit: 2015-07-02 | Discharge: 2015-07-02 | Disposition: A | Discharge status: Home / Self Care | Payer: Medicare Other | Source: Ambulatory Visit | Attending: Cardiovascular Disease | Admitting: Cardiovascular Disease

## 2015-07-02 DIAGNOSIS — R931 Abnormal findings on diagnostic imaging of heart and coronary circulation: Secondary | ICD-10-CM | POA: Diagnosis not present

## 2015-07-02 DIAGNOSIS — I10 Essential (primary) hypertension: Secondary | ICD-10-CM | POA: Diagnosis not present

## 2015-07-02 DIAGNOSIS — I2510 Atherosclerotic heart disease of native coronary artery without angina pectoris: Secondary | ICD-10-CM | POA: Diagnosis present

## 2015-07-02 DIAGNOSIS — Z87891 Personal history of nicotine dependence: Secondary | ICD-10-CM | POA: Insufficient documentation

## 2015-07-02 DIAGNOSIS — E663 Overweight: Secondary | ICD-10-CM | POA: Diagnosis not present

## 2015-07-02 DIAGNOSIS — Z981 Arthrodesis status: Secondary | ICD-10-CM | POA: Insufficient documentation

## 2015-07-02 DIAGNOSIS — Z7984 Long term (current) use of oral hypoglycemic drugs: Secondary | ICD-10-CM | POA: Diagnosis not present

## 2015-07-02 DIAGNOSIS — R9439 Abnormal result of other cardiovascular function study: Secondary | ICD-10-CM

## 2015-07-02 DIAGNOSIS — Z7982 Long term (current) use of aspirin: Secondary | ICD-10-CM | POA: Diagnosis not present

## 2015-07-02 DIAGNOSIS — Z6826 Body mass index (BMI) 26.0-26.9, adult: Secondary | ICD-10-CM | POA: Insufficient documentation

## 2015-07-02 DIAGNOSIS — E785 Hyperlipidemia, unspecified: Secondary | ICD-10-CM | POA: Insufficient documentation

## 2015-07-02 DIAGNOSIS — Z7902 Long term (current) use of antithrombotics/antiplatelets: Secondary | ICD-10-CM | POA: Insufficient documentation

## 2015-07-02 DIAGNOSIS — I2582 Chronic total occlusion of coronary artery: Secondary | ICD-10-CM | POA: Diagnosis not present

## 2015-07-02 DIAGNOSIS — Z955 Presence of coronary angioplasty implant and graft: Secondary | ICD-10-CM | POA: Insufficient documentation

## 2015-07-02 DIAGNOSIS — I2583 Coronary atherosclerosis due to lipid rich plaque: Secondary | ICD-10-CM | POA: Insufficient documentation

## 2015-07-02 HISTORY — PX: CARDIAC CATHETERIZATION: SHX172

## 2015-07-02 SURGERY — LEFT HEART CATH AND CORONARY ANGIOGRAPHY

## 2015-07-02 MED ORDER — ASPIRIN 81 MG PO CHEW: 81.0000 mg | CHEWABLE_TABLET | ORAL | Status: DC

## 2015-07-02 MED ORDER — ACETAMINOPHEN 325 MG PO TABS: 650.0000 mg | ORAL_TABLET | ORAL | Status: DC | PRN

## 2015-07-02 MED ORDER — HEPARIN (PORCINE) IN NACL 2-0.9 UNIT/ML-% IJ SOLN
INTRAMUSCULAR | Status: AC
  Filled 2015-07-02: qty 1000

## 2015-07-02 MED ORDER — HEPARIN (PORCINE) IN NACL 2-0.9 UNIT/ML-% IJ SOLN
INTRAMUSCULAR | Status: DC | PRN
  Administered 2015-07-02: 1000 mL

## 2015-07-02 MED ORDER — SODIUM CHLORIDE 0.9% FLUSH: 3.0000 mL | INTRAVENOUS | Status: DC | PRN

## 2015-07-02 MED ORDER — CLOPIDOGREL BISULFATE 75 MG PO TABS: 75.0000 mg | ORAL_TABLET | Freq: Every day | ORAL | Status: DC

## 2015-07-02 MED ORDER — ONDANSETRON HCL 4 MG/2ML IJ SOLN: 4.0000 mg | Freq: Four times a day (QID) | INTRAMUSCULAR | Status: DC | PRN

## 2015-07-02 MED ORDER — VERAPAMIL HCL 2.5 MG/ML IV SOLN
INTRAVENOUS | Status: AC
  Filled 2015-07-02: qty 2

## 2015-07-02 MED ORDER — MORPHINE SULFATE (PF) 2 MG/ML IV SOLN: 2.0000 mg | INTRAVENOUS | Status: DC | PRN

## 2015-07-02 MED ORDER — VERAPAMIL HCL 2.5 MG/ML IV SOLN
INTRA_ARTERIAL | Status: DC | PRN
  Administered 2015-07-02: 15 mL via INTRA_ARTERIAL

## 2015-07-02 MED ORDER — MIDAZOLAM HCL 2 MG/2ML IJ SOLN
INTRAMUSCULAR | Status: DC | PRN
Start: 2015-07-02 — End: 2015-07-02
  Administered 2015-07-02: 1 mg via INTRAVENOUS

## 2015-07-02 MED ORDER — FENTANYL CITRATE (PF) 100 MCG/2ML IJ SOLN
INTRAMUSCULAR | Status: DC | PRN
  Administered 2015-07-02: 25 ug via INTRAVENOUS

## 2015-07-02 MED ORDER — HEPARIN SODIUM (PORCINE) 1000 UNIT/ML IJ SOLN
INTRAMUSCULAR | Status: AC
  Filled 2015-07-02: qty 1

## 2015-07-02 MED ORDER — ASPIRIN 81 MG PO CHEW: 81.0000 mg | CHEWABLE_TABLET | Freq: Every day | ORAL | Status: DC

## 2015-07-02 MED ORDER — LIDOCAINE HCL (PF) 1 % IJ SOLN
INTRAMUSCULAR | Status: DC | PRN
  Administered 2015-07-02: 5 mL

## 2015-07-02 MED ORDER — FENTANYL CITRATE (PF) 100 MCG/2ML IJ SOLN
INTRAMUSCULAR | Status: AC
  Filled 2015-07-02: qty 2

## 2015-07-02 MED ORDER — IOHEXOL 350 MG/ML SOLN
INTRAVENOUS | Status: DC | PRN
  Administered 2015-07-02: 80 mL via INTRA_ARTERIAL

## 2015-07-02 MED ORDER — HEPARIN SODIUM (PORCINE) 1000 UNIT/ML IJ SOLN
INTRAMUSCULAR | Status: DC | PRN
  Administered 2015-07-02: 3000 [IU] via INTRAVENOUS

## 2015-07-02 MED ORDER — NITROGLYCERIN 1 MG/10 ML FOR IR/CATH LAB
INTRA_ARTERIAL | Status: AC
  Filled 2015-07-02: qty 10

## 2015-07-02 MED ORDER — SODIUM CHLORIDE 0.9 % IV SOLN: 250.0000 mL | INTRAVENOUS | Status: DC | PRN

## 2015-07-02 MED ORDER — SODIUM CHLORIDE 0.9 % WEIGHT BASED INFUSION: 1.0000 mL/kg/h | INTRAVENOUS | Status: DC

## 2015-07-02 MED ORDER — MIDAZOLAM HCL 2 MG/2ML IJ SOLN
INTRAMUSCULAR | Status: AC
  Filled 2015-07-02: qty 2

## 2015-07-02 MED ORDER — SODIUM CHLORIDE 0.9 % WEIGHT BASED INFUSION
3.0000 mL/kg/h | INTRAVENOUS | Status: DC
  Administered 2015-07-02: 3 mL/kg/h via INTRAVENOUS

## 2015-07-02 MED ORDER — SODIUM CHLORIDE 0.9 % WEIGHT BASED INFUSION: 3.0000 mL/kg/h | INTRAVENOUS | Status: DC

## 2015-07-02 MED ORDER — LIDOCAINE HCL (PF) 1 % IJ SOLN
INTRAMUSCULAR | Status: AC
  Filled 2015-07-02: qty 30

## 2015-07-02 MED ORDER — SODIUM CHLORIDE 0.9% FLUSH: 3.0000 mL | Freq: Two times a day (BID) | INTRAVENOUS | Status: DC

## 2015-07-02 SURGICAL SUPPLY — 11 items

## 2015-07-02 NOTE — H&P (View-Only) (Signed)
06/27/2015 Brittany Figueroa   09-28-1940  [id number]  Primary Physician Thressa Sheller, MD Primary Cardiologist: [name] MD [name]   HPI:  The patient is a 75 year old mildly overweight divorced Caucasian female, mother to 2, grandmother to 3 grandchildren, who I last saw 10/17/14. She has a history of hypertension and hyperlipidemia. I saw her in December of 2013 with new anterolateral T-wave inversion and a Myoview that showed inferolateral ischemia that was new compared to a prior study. She did have some unusual symptoms at that time. Based on this, I catheterized her on May 13, 2012, revealing an 80% proximal LAD lesion, which I stented using a drug-eluting stent, as well as a total dominant RCA with left to right collaterals and normal LV function. Since stenting her LAD, she is ultimately asymptomatic. Her other problems include hypertension and hyperlipidemia. I have reviewed her blood pressures, which were recorded during cardiac rehab, which were all normal. Her recent blood work revealed a total cholesterol of 161, LDL of 89 and HDL of 40.she denies chest pain or shortness of breath. She had a recent Myoview stress test performed several months ago that showed ischemia in the RCA territory but otherwise unremarkable is explainable by her known total dominant RCA with left to right collaterals. Chief complaint of excruciating neck pain thought to be related to cervical disc disease. She was evaluated by Dr. Because of her neck pain demonstrated cervical disc disease requiring fusion. Because of this she will need to undergo pharmacologic Myoview stress testing to risk stratify her. She underwent cervical discectomy by Dr. Deri Fuelling with excellent clinical result. She no longer is in pain. Her major issue now is treatment of her hyperlipidemia. She is statin intolerant and complains of short-term memory loss. Ultimately, I think she will require P CSK9 monoclonal  injectable therapy although apparently her insurance will not cover enough to make it financially feasible. She saw Rosaria Ferries in the office approximately 2 weeks ago with increasing dyspnea on exertion. A Myoview stress test performed 06/15/15 showed significant ischemia in the RCA territory. This is new compared to her previous Myoview performed in 2015 and suggest the possibility of in-stent restenosis and/or a denovo  lesion in the LAD territory.   Current Outpatient Prescriptions  Medication Sig Dispense Refill  . acetaminophen (TYLENOL) 500 MG tablet Take 500 mg by mouth every 6 (six) hours as needed for mild pain or headache.    Marland Kitchen aspirin 81 MG tablet Take 81 mg by mouth daily.    . calcium-vitamin D (OSCAL WITH D) 500-200 MG-UNIT per tablet Take 2 tablets by mouth daily.    . carvedilol (COREG) 6.25 MG tablet Take 6.25 mg by mouth 2 (two) times daily with a meal.     . Cholecalciferol (VITAMIN D3) 2000 UNITS TABS Take 2,000 Units by mouth 2 (two) times daily.    . clopidogrel (PLAVIX) 75 MG tablet Take 75 mg by mouth daily.    . diclofenac sodium (VOLTAREN) 1 % GEL Apply 2 g topically daily as needed. For pain    . hydrocortisone valerate cream (WESTCORT) 0.2 % Apply 1 application topically daily as needed (ITCHING).    . Investigational - Study Medication Take 1 tablet by mouth daily. Bempedoic Acid 180 mg Tablet or Placebo Tablet for Oral Use    . irbesartan (AVAPRO) 150 MG tablet TAKE 1 TABLET BY MOUTH EVERY DAY 30 tablet 6  . metFORMIN (GLUCOPHAGE-XR) 500 MG 24 hr tablet Take  500 mg by mouth 2 (two) times daily.    . Multiple Vitamin (MULTIVITAMIN WITH MINERALS) TABS Take 1 tablet by mouth daily.    Marland Kitchen NITROSTAT 0.4 MG SL tablet PLACE 1 TABLET UNDER THE TONGUE EVERY 5 MINTUES AS NEEDED FOR CHEST PAIN 25 tablet 2  . pravastatin (PRAVACHOL) 20 MG tablet Take 1 tablet (20 mg total) by mouth every evening. 30 tablet 3  . ZETIA 10 MG tablet Take 1 tablet (10 mg total) by mouth daily. 30  tablet 6   No current facility-administered medications for this visit.    Allergies  Allergen Reactions  . Statins Other (See Comments)    "intermittent loss of circulation; hands and arms will go to sleep; feet will get cramps; fatigue" (05/13/2012).  This occurred with Lipitor, Zocor, Crestor 5 mg qd, and she thinks pravastatin as well  . Amlodipine Itching    Social History   Social History  . Marital Status: Divorced    Spouse Name: N/A  . Number of Children: N/A  . Years of Education: N/A   Occupational History  . Not on file.   Social History Main Topics  . Smoking status: Former Smoker -- 0.50 packs/day for 20 years    Types: Cigarettes    Quit date: 07/05/1988  . Smokeless tobacco: Never Used  . Alcohol Use: 7.2 oz/week    12 Glasses of wine per week     Comment: 05/13/2012 "6-8 oz glass of red wine q hs"   . Drug Use: No  . Sexual Activity: No   Other Topics Concern  . Not on file   Social History Narrative     Review of Systems: General: negative for chills, fever, night sweats or weight changes.  Cardiovascular: negative for chest pain, dyspnea on exertion, edema, orthopnea, palpitations, paroxysmal nocturnal dyspnea or shortness of breath Dermatological: negative for rash Respiratory: negative for cough or wheezing Urologic: negative for hematuria Abdominal: negative for nausea, vomiting, diarrhea, bright red blood per rectum, melena, or hematemesis Neurologic: negative for visual changes, syncope, or dizziness All other systems reviewed and are otherwise negative except as noted above.    Blood pressure 160/88, pulse 82, height 5\' 1"  (1.549 m), weight 145 lb 3.2 oz (65.862 kg).  General appearance: alert and no distress Neck: no adenopathy, no carotid bruit, no JVD, supple, symmetrical, trachea midline and thyroid not enlarged, symmetric, no tenderness/mass/nodules Lungs: clear to auscultation bilaterally Heart: regular rate and rhythm, S1, S2  normal, no murmur, click, rub or gallop Extremities: extremities normal, atraumatic, no cyanosis or edema  EKG not performed today  ASSESSMENT AND PLAN:   Hyperlipidemia History of hyperlipidemia on pravastatin 20 mg a day. She also is on a lipid-lowering study drug. She has had statin intolerance because of short-term memory loss. Apparently she is not at goal. I'm going to increase the pravastatin from 20-40 mg a day and will recheck a lipid and liver profile  HTN (hypertension) History of hypertension with blood pressure measures 160/88. She is on carvedilol and Avapro. Continue current meds at current dosing  CAD (coronary artery disease), 05/13/12, with 80% LAD and totally occluded RCA History of CAD status post proximal LAD stenting by myself 05/13/12 with a known occluded dominant RCA and left right collaterals. Her last Myoview performed in 2015 did not show significant inferior ischemia however because of increasing dyspnea on exertion a recent Myoview performed 06/14/15 did show significant inferior ischemia. I'm concerned that she may have had restenosis of her  proximal LAD providing the collaterals to the occluded RCA and a saphenous either side to proceed with cardiac catheterization this coming Monday. A right radial approach.The patient understands that risks included but are not limited to stroke (1 in 1000), death (1 in 41), kidney failure [usually temporary] (1 in 500), bleeding (1 in 200), allergic reaction [possibly serious] (1 in 200). The patient understands and agrees to proceed      [name] MD [facility], [facility] 06/27/2015 9:55 AM

## 2015-07-02 NOTE — Discharge Instructions (Signed)
Radial Site Care °Refer to this sheet in the next few weeks. These instructions provide you with information about caring for yourself after your procedure. Your health care provider may also give you more specific instructions. Your treatment has been planned according to current medical practices, but problems sometimes occur. Call your health care provider if you have any problems or questions after your procedure. °WHAT TO EXPECT AFTER THE PROCEDURE °After your procedure, it is typical to have the following: °· Bruising at the radial site that usually fades within 1-2 weeks. °· Blood collecting in the tissue (hematoma) that may be painful to the touch. It should usually decrease in size and tenderness within 1-2 weeks. °HOME CARE INSTRUCTIONS °· Take medicines only as directed by your health care provider. °· You may shower 24-48 hours after the procedure or as directed by your health care provider. Remove the bandage (dressing) and gently wash the site with plain soap and water. Pat the area dry with a clean towel. Do not rub the site, because this may cause bleeding. °· Do not take baths, swim, or use a hot tub until your health care provider approves. °· Check your insertion site every day for redness, swelling, or drainage. °· Do not apply powder or lotion to the site. °· Do not flex or bend the affected arm for 24 hours or as directed by your health care provider. °· Do not push or pull heavy objects with the affected arm for 24 hours or as directed by your health care provider. °· Do not lift over 10 lb (4.5 kg) for 5 days after your procedure or as directed by your health care provider. °· Ask your health care provider when it is okay to: °¨ Return to work or school. °¨ Resume usual physical activities or sports. °¨ Resume sexual activity. °· Do not drive home if you are discharged the same day as the procedure. Have someone else drive you. °· You may drive 24 hours after the procedure unless otherwise  instructed by your health care provider. °· Do not operate machinery or power tools for 24 hours after the procedure. °· If your procedure was done as an outpatient procedure, which means that you went home the same day as your procedure, a responsible adult should be with you for the first 24 hours after you arrive home. °· Keep all follow-up visits as directed by your health care provider. This is important. °SEEK MEDICAL CARE IF: °· You have a fever. °· You have chills. °· You have increased bleeding from the radial site. Hold pressure on the site. °SEEK IMMEDIATE MEDICAL CARE IF: °· You have unusual pain at the radial site. °· You have redness, warmth, or swelling at the radial site. °· You have drainage (other than a small amount of blood on the dressing) from the radial site. °· The radial site is bleeding, and the bleeding does not stop after 30 minutes of holding steady pressure on the site. °· Your arm or hand becomes pale, cool, tingly, or numb. °  °This information is not intended to replace advice given to you by your health care provider. Make sure you discuss any questions you have with your health care provider. °  °Document Released: 06/07/2010 Document Revised: 05/26/2014 Document Reviewed: 11/21/2013 °Elsevier Interactive Patient Education ©2016 [employer] ° °

## 2015-07-02 NOTE — Interval H&P Note (Signed)
Cath Lab Visit (complete for each Cath Lab visit)  Clinical Evaluation Leading to the Procedure:   ACS: No.  Non-ACS:    Anginal Classification: CCS III  Anti-ischemic medical therapy: No Therapy  Non-Invasive Test Results: Intermediate-risk stress test findings: cardiac mortality 1-3%/year  Prior CABG: No previous CABG      History and Physical Interval Note:  07/02/2015 10:42 AM  Brittany Figueroa  has presented today for surgery, with the diagnosis of cad  The various methods of treatment have been discussed with the patient and family. After consideration of risks, benefits and other options for treatment, the patient has consented to  Procedure(s): Left Heart Cath and Coronary Angiography (N/A) as a surgical intervention .  The patient's history has been reviewed, patient examined, no change in status, stable for surgery.  I have reviewed the patient's chart and labs.  Questions were answered to the patient's satisfaction.     Quay Burow

## 2015-07-04 DIAGNOSIS — S338XXA Sprain of other parts of lumbar spine and pelvis, initial encounter: Secondary | ICD-10-CM | POA: Diagnosis not present

## 2015-07-04 DIAGNOSIS — S39012A Strain of muscle, fascia and tendon of lower back, initial encounter: Secondary | ICD-10-CM | POA: Diagnosis not present

## 2015-07-04 DIAGNOSIS — M9905 Segmental and somatic dysfunction of pelvic region: Secondary | ICD-10-CM | POA: Diagnosis not present

## 2015-07-04 DIAGNOSIS — M9902 Segmental and somatic dysfunction of thoracic region: Secondary | ICD-10-CM | POA: Diagnosis not present

## 2015-07-04 DIAGNOSIS — M9903 Segmental and somatic dysfunction of lumbar region: Secondary | ICD-10-CM | POA: Diagnosis not present

## 2015-07-04 DIAGNOSIS — S29012A Strain of muscle and tendon of back wall of thorax, initial encounter: Secondary | ICD-10-CM | POA: Diagnosis not present

## 2015-07-05 DIAGNOSIS — I209 Angina pectoris, unspecified: Secondary | ICD-10-CM | POA: Diagnosis not present

## 2015-07-05 DIAGNOSIS — Z0001 Encounter for general adult medical examination with abnormal findings: Secondary | ICD-10-CM | POA: Diagnosis not present

## 2015-07-05 DIAGNOSIS — Z1389 Encounter for screening for other disorder: Secondary | ICD-10-CM | POA: Diagnosis not present

## 2015-07-05 DIAGNOSIS — I129 Hypertensive chronic kidney disease with stage 1 through stage 4 chronic kidney disease, or unspecified chronic kidney disease: Secondary | ICD-10-CM | POA: Diagnosis not present

## 2015-07-05 DIAGNOSIS — E1122 Type 2 diabetes mellitus with diabetic chronic kidney disease: Secondary | ICD-10-CM | POA: Diagnosis not present

## 2015-07-05 DIAGNOSIS — N182 Chronic kidney disease, stage 2 (mild): Secondary | ICD-10-CM | POA: Diagnosis not present

## 2015-07-05 DIAGNOSIS — E663 Overweight: Secondary | ICD-10-CM | POA: Diagnosis not present

## 2015-07-10 ENCOUNTER — Ambulatory Visit: Payer: Medicare Other | Admitting: Cardiovascular Disease

## 2015-07-17 DIAGNOSIS — D229 Melanocytic nevi, unspecified: Secondary | ICD-10-CM | POA: Diagnosis not present

## 2015-07-17 DIAGNOSIS — L820 Inflamed seborrheic keratosis: Secondary | ICD-10-CM | POA: Diagnosis not present

## 2015-07-17 DIAGNOSIS — M4712 Other spondylosis with myelopathy, cervical region: Secondary | ICD-10-CM | POA: Diagnosis not present

## 2015-07-17 DIAGNOSIS — G450 Vertebro-basilar artery syndrome: Secondary | ICD-10-CM | POA: Diagnosis not present

## 2015-07-18 ENCOUNTER — Other Ambulatory Visit: Payer: Self-pay | Admitting: Neurosurgery

## 2015-07-18 DIAGNOSIS — G450 Vertebro-basilar artery syndrome: Secondary | ICD-10-CM

## 2015-07-24 DIAGNOSIS — M9905 Segmental and somatic dysfunction of pelvic region: Secondary | ICD-10-CM | POA: Diagnosis not present

## 2015-07-24 DIAGNOSIS — S29012A Strain of muscle and tendon of back wall of thorax, initial encounter: Secondary | ICD-10-CM | POA: Diagnosis not present

## 2015-07-24 DIAGNOSIS — S39012A Strain of muscle, fascia and tendon of lower back, initial encounter: Secondary | ICD-10-CM | POA: Diagnosis not present

## 2015-07-24 DIAGNOSIS — S338XXA Sprain of other parts of lumbar spine and pelvis, initial encounter: Secondary | ICD-10-CM | POA: Diagnosis not present

## 2015-07-24 DIAGNOSIS — M9903 Segmental and somatic dysfunction of lumbar region: Secondary | ICD-10-CM | POA: Diagnosis not present

## 2015-07-24 DIAGNOSIS — M9902 Segmental and somatic dysfunction of thoracic region: Secondary | ICD-10-CM | POA: Diagnosis not present

## 2015-07-25 ENCOUNTER — Encounter: Payer: Self-pay | Admitting: Cardiovascular Disease

## 2015-07-25 ENCOUNTER — Ambulatory Visit (INDEPENDENT_AMBULATORY_CARE_PROVIDER_SITE_OTHER): Payer: Medicare Other | Admitting: Cardiovascular Disease

## 2015-07-25 VITALS — BP 148/84 | HR 76 | Ht 61.0 in | Wt 142.0 lb

## 2015-07-25 DIAGNOSIS — I2510 Atherosclerotic heart disease of native coronary artery without angina pectoris: Secondary | ICD-10-CM | POA: Diagnosis not present

## 2015-07-25 DIAGNOSIS — I2583 Coronary atherosclerosis due to lipid rich plaque: Principal | ICD-10-CM

## 2015-07-25 NOTE — Progress Notes (Signed)
07/25/2015 Brittany Figueroa   1940-12-24  OG:[id number]  Primary Physician Thressa Sheller, MD Primary Cardiologist: [name] MD [name]   HPI:  The patient is a 75 year old mildly overweight divorced Caucasian female, mother to 2, grandmother to 3 grandchildren, who I last saw 06/27/15.Marland Kitchen She has a history of hypertension and hyperlipidemia. I saw her in December of 2013 with new anterolateral T-wave inversion and a Myoview that showed inferolateral ischemia that was new compared to a prior study. She did have some unusual symptoms at that time. Based on this, I catheterized her on May 13, 2012, revealing an 80% proximal LAD lesion, which I stented using a drug-eluting stent, as well as a total dominant RCA with left to right collaterals and normal LV function. Since stenting her LAD, she is ultimately asymptomatic. Her other problems include hypertension and hyperlipidemia. I have reviewed her blood pressures, which were recorded during cardiac rehab, which were all normal. Her recent blood work revealed a total cholesterol of 161, LDL of 89 and HDL of 40.she denies chest pain or shortness of breath. She had a recent Myoview stress test performed several months ago that showed ischemia in the RCA territory but otherwise unremarkable is explainable by her known total dominant RCA with left to right collaterals. Chief complaint of excruciating neck pain thought to be related to cervical disc disease. She was evaluated by Dr. Because of her neck pain demonstrated cervical disc disease requiring fusion. Because of this she will need to undergo pharmacologic Myoview stress testing to risk stratify her. She underwent cervical discectomy by Dr. Deri Fuelling with excellent clinical result. She no longer is in pain. Her major issue now is treatment of her hyperlipidemia. She is statin intolerant and complains of short-term memory loss. Ultimately, I think she will require P CSK9 monoclonal  injectable therapy although apparently her insurance will not cover enough to make it financially feasible. She saw Rosaria Ferries in the office approximately 2 weeks ago with increasing dyspnea on exertion. A Myoview stress test performed 06/15/15 showed significant ischemia in the RCA territory. This is new compared to her previous Myoview performed in 2015 and suggest the possibility of in-stent restenosis and/or a denovo lesion in the LAD territory. I performed coronary angiography on her 07/02/15 via the right radial approach revealing unchanged anatomy compared to her prior cath 4 years ago. Her proximal LAD stent was patent. Her 80% diagonal branch stenosis was unchanged and her RCA was dominant, occluded proximally with left-to-right collaterals with normal LV function. Since her procedure 3 weeks ago her symptoms of shortness of breath have resolved.   Current Outpatient Prescriptions  Medication Sig Dispense Refill  . acetaminophen (TYLENOL) 500 MG tablet Take 500 mg by mouth every 6 (six) hours as needed for mild pain or headache.    Marland Kitchen aspirin 81 MG tablet Take 81 mg by mouth daily.    . calcium-vitamin D (OSCAL WITH D) 500-200 MG-UNIT per tablet Take 2 tablets by mouth daily.    . carvedilol (COREG) 6.25 MG tablet Take 6.25 mg by mouth 2 (two) times daily with a meal.     . Cholecalciferol (VITAMIN D3) 2000 UNITS TABS Take 2,000 Units by mouth 2 (two) times daily.    . clopidogrel (PLAVIX) 75 MG tablet Take 75 mg by mouth daily.    . diclofenac sodium (VOLTAREN) 1 % GEL Apply 2 g topically daily as needed. For pain    . hydrocortisone valerate cream (WESTCORT) 0.2 %  Apply 1 application topically daily as needed (ITCHING).    . Investigational - Study Medication Take 1 tablet by mouth daily. Bempedoic Acid 180 mg Tablet or Placebo Tablet for Oral Use    . irbesartan (AVAPRO) 150 MG tablet TAKE 1 TABLET BY MOUTH EVERY DAY 30 tablet 0  . metFORMIN (GLUCOPHAGE-XR) 500 MG 24 hr tablet Take 500  mg by mouth 2 (two) times daily.    . Multiple Vitamin (MULTIVITAMIN WITH MINERALS) TABS Take 1 tablet by mouth daily.    Marland Kitchen NITROSTAT 0.4 MG SL tablet PLACE 1 TABLET UNDER THE TONGUE EVERY 5 MINTUES AS NEEDED FOR CHEST PAIN 25 tablet 2  . pravastatin (PRAVACHOL) 40 MG tablet Take 1 tablet (40 mg total) by mouth every evening. (Patient taking differently: Take 20 mg by mouth every evening. ) 30 tablet 11  . ZETIA 10 MG tablet Take 1 tablet (10 mg total) by mouth daily. 30 tablet 6   No current facility-administered medications for this visit.    Allergies  Allergen Reactions  . Statins Other (See Comments)    "intermittent loss of circulation; hands and arms will go to sleep; feet will get cramps; fatigue" (05/13/2012).  This occurred with Lipitor, Zocor, Crestor 5 mg qd, and she thinks pravastatin as well  . Amlodipine Itching    Social History   Social History  . Marital Status: Divorced    Spouse Name: N/A  . Number of Children: N/A  . Years of Education: N/A   Occupational History  . Not on file.   Social History Main Topics  . Smoking status: Former Smoker -- 0.50 packs/day for 20 years    Types: Cigarettes    Quit date: 07/05/1988  . Smokeless tobacco: Never Used  . Alcohol Use: 7.2 oz/week    12 Glasses of wine per week     Comment: 05/13/2012 "6-8 oz glass of red wine q hs"   . Drug Use: No  . Sexual Activity: No   Other Topics Concern  . Not on file   Social History Narrative     Review of Systems: General: negative for chills, fever, night sweats or weight changes.  Cardiovascular: negative for chest pain, dyspnea on exertion, edema, orthopnea, palpitations, paroxysmal nocturnal dyspnea or shortness of breath Dermatological: negative for rash Respiratory: negative for cough or wheezing Urologic: negative for hematuria Abdominal: negative for nausea, vomiting, diarrhea, bright red blood per rectum, melena, or hematemesis Neurologic: negative for visual  changes, syncope, or dizziness All other systems reviewed and are otherwise negative except as noted above.    Blood pressure 148/84, pulse 76, height 5\' 1"  (1.549 m), weight 142 lb (64.411 kg).  General appearance: alert and no distress Neck: no adenopathy, no carotid bruit, no JVD, supple, symmetrical, trachea midline and thyroid not enlarged, symmetric, no tenderness/mass/nodules Lungs: clear to auscultation bilaterally Heart: regular rate and rhythm, S1, S2 normal, no murmur, click, rub or gallop and her right radial arterial puncture site is well-healed. Extremities: extremities normal, atraumatic, no cyanosis or edema  EKG not performed today  ASSESSMENT AND PLAN:   CAD (coronary artery disease), 05/13/12, with 80% LAD and totally occluded RCA Mrs. Guardiola returns today for posthospital follow-up after her outpatient heart catheterization performed radially by myself 07/02/15. This was done because of increasing dyspnea on exertion and an abnormal Myoview stress test  Before inferior ischemia. Her anatomy was basically unchanged from her prior cath 4 years ago. Her proximal LAD stent was widely patent. She did  have an 80% diagonal branch stenosis and a relatively small vessel unchanged from her prior cath. Her RCA was dominant and occluded proximally filling by left-to-right collaterals. She had normal LV function.since I saw her current symptoms of shortness of breath has resolved. She denies chest pain. She is relieved by the results of her cardiac catheterization.      [name] MD FACP,FACC,FAHA, FSCAI 07/25/2015 12:00 PM

## 2015-07-25 NOTE — Patient Instructions (Signed)

## 2015-07-25 NOTE — Assessment & Plan Note (Signed)
Brittany Figueroa returns today for posthospital follow-up after her outpatient heart catheterization performed radially by myself 07/02/15. This was done because of increasing dyspnea on exertion and an abnormal Myoview stress test  Before inferior ischemia. Her anatomy was basically unchanged from her prior cath 4 years ago. Her proximal LAD stent was widely patent. She did have an 80% diagonal branch stenosis and a relatively small vessel unchanged from her prior cath. Her RCA was dominant and occluded proximally filling by left-to-right collaterals. She had normal LV function.since I saw her current symptoms of shortness of breath has resolved. She denies chest pain. She is relieved by the results of her cardiac catheterization.

## 2015-07-31 ENCOUNTER — Ambulatory Visit
Admission: RE | Admit: 2015-07-31 | Discharge: 2015-07-31 | Disposition: A | Discharge status: Home / Self Care | Payer: Medicare Other | Source: Ambulatory Visit | Attending: Neurosurgery | Admitting: Neurosurgery

## 2015-07-31 DIAGNOSIS — R2689 Other abnormalities of gait and mobility: Secondary | ICD-10-CM | POA: Diagnosis not present

## 2015-07-31 DIAGNOSIS — G450 Vertebro-basilar artery syndrome: Secondary | ICD-10-CM

## 2015-07-31 MED ORDER — GADOBENATE DIMEGLUMINE 529 MG/ML IV SOLN
13.0000 mL | Freq: Once | INTRAVENOUS | Status: AC | PRN
  Administered 2015-07-31: 13 mL via INTRAVENOUS

## 2015-08-06 ENCOUNTER — Other Ambulatory Visit: Payer: Self-pay | Admitting: *Deleted

## 2015-08-06 ENCOUNTER — Other Ambulatory Visit: Payer: Self-pay | Admitting: Cardiovascular Disease

## 2015-08-06 ENCOUNTER — Telehealth: Payer: Self-pay | Admitting: Endocrinology

## 2015-08-06 MED ORDER — PROMETHAZINE HCL 12.5 MG PO TABS: 12.5000 mg | ORAL_TABLET | Freq: Three times a day (TID) | ORAL | Status: DC | PRN

## 2015-08-06 NOTE — Telephone Encounter (Signed)
Please see below and advise.

## 2015-08-06 NOTE — Telephone Encounter (Signed)
She can stop metformin to see if it is better.  She can take Phenergan 12.5 mg up to 3 times a day as needed, 10 tablets to be sent

## 2015-08-06 NOTE — Telephone Encounter (Signed)
Rx refill sent to pharmacy. 

## 2015-08-06 NOTE — Telephone Encounter (Signed)
Pt has been having nausea for about 10 days and she wants to know if the Metformin can cause this.  She said that it has been pretty severe and she is leaving to go out of town tomorrow and wants to know if there could be something possibly called in for her for nausea.

## 2015-08-06 NOTE — Telephone Encounter (Signed)
Noted, patient is aware, rx sent 

## 2015-08-14 DIAGNOSIS — M9902 Segmental and somatic dysfunction of thoracic region: Secondary | ICD-10-CM | POA: Diagnosis not present

## 2015-08-14 DIAGNOSIS — S39012A Strain of muscle, fascia and tendon of lower back, initial encounter: Secondary | ICD-10-CM | POA: Diagnosis not present

## 2015-08-14 DIAGNOSIS — S338XXA Sprain of other parts of lumbar spine and pelvis, initial encounter: Secondary | ICD-10-CM | POA: Diagnosis not present

## 2015-08-14 DIAGNOSIS — M9905 Segmental and somatic dysfunction of pelvic region: Secondary | ICD-10-CM | POA: Diagnosis not present

## 2015-08-14 DIAGNOSIS — M9903 Segmental and somatic dysfunction of lumbar region: Secondary | ICD-10-CM | POA: Diagnosis not present

## 2015-08-14 DIAGNOSIS — S29012A Strain of muscle and tendon of back wall of thorax, initial encounter: Secondary | ICD-10-CM | POA: Diagnosis not present

## 2015-08-21 DIAGNOSIS — M4712 Other spondylosis with myelopathy, cervical region: Secondary | ICD-10-CM | POA: Diagnosis not present

## 2015-08-22 DIAGNOSIS — M216X1 Other acquired deformities of right foot: Secondary | ICD-10-CM | POA: Diagnosis not present

## 2015-08-22 DIAGNOSIS — M216X2 Other acquired deformities of left foot: Secondary | ICD-10-CM | POA: Diagnosis not present

## 2015-08-22 DIAGNOSIS — M2012 Hallux valgus (acquired), left foot: Secondary | ICD-10-CM | POA: Diagnosis not present

## 2015-08-22 DIAGNOSIS — M2011 Hallux valgus (acquired), right foot: Secondary | ICD-10-CM | POA: Diagnosis not present

## 2015-08-29 ENCOUNTER — Other Ambulatory Visit (INDEPENDENT_AMBULATORY_CARE_PROVIDER_SITE_OTHER): Payer: Medicare Other

## 2015-08-29 DIAGNOSIS — R7301 Impaired fasting glucose: Secondary | ICD-10-CM | POA: Diagnosis not present

## 2015-08-29 LAB — GLUCOSE, RANDOM: Glucose, Bld: 117 mg/dL — ABNORMAL HIGH (ref 70–99)

## 2015-08-29 LAB — HEMOGLOBIN A1C: Hgb A1c MFr Bld: 6.2 % (ref 4.6–6.5)

## 2015-08-30 DIAGNOSIS — H353121 Nonexudative age-related macular degeneration, left eye, early dry stage: Secondary | ICD-10-CM | POA: Diagnosis not present

## 2015-08-30 DIAGNOSIS — H353111 Nonexudative age-related macular degeneration, right eye, early dry stage: Secondary | ICD-10-CM | POA: Diagnosis not present

## 2015-09-03 ENCOUNTER — Encounter: Payer: Self-pay | Admitting: Endocrinology

## 2015-09-03 ENCOUNTER — Ambulatory Visit (INDEPENDENT_AMBULATORY_CARE_PROVIDER_SITE_OTHER): Payer: Medicare Other | Admitting: Endocrinology

## 2015-09-03 ENCOUNTER — Other Ambulatory Visit: Payer: Self-pay | Admitting: Endocrinology

## 2015-09-03 VITALS — BP 124/68 | HR 80 | Temp 98.3°F | Resp 14 | Ht 61.0 in | Wt 145.0 lb

## 2015-09-03 DIAGNOSIS — R7301 Impaired fasting glucose: Secondary | ICD-10-CM | POA: Diagnosis not present

## 2015-09-03 DIAGNOSIS — I2510 Atherosclerotic heart disease of native coronary artery without angina pectoris: Secondary | ICD-10-CM | POA: Diagnosis not present

## 2015-09-03 NOTE — Progress Notes (Signed)
Patient ID: Brittany Figueroa, female   DOB: 26-Mar-1941, 75 y.o.   MRN: [id number]           Chief complaint:  Follow-up of prediabetes  History of Present Illness:  She was told by her PCP that she has type 2 diabetes based on an A1c of 6.5% in 07/2013 She has not had any other A1c is subsequently above normal, normal range and previous lab 6.2 or less She also has not had any abnormal blood sugars except a glucose of 125 in 3/16  She does have increased fasting blood sugars ranging from 108-125 since about 2012  She may have been told about 10 years ago that she had prediabetes and reportedly had a normal glucose tolerance test.  RECENT history:  GLUCOSE TOLERANCE test on 05/23/15: Fasting glucose 110, two-hour glucose 192  She says she has limited carbohydrates and no added sugar in her meals and drinks At breakfast she may or may not eat a carbohydrate such as bread  She usually is consuming lean meats  She exercises fairly regularly on his active as much as tolerated Some of her walking ability is limited by pain in her feet and legs, may walk up to 35 minutes  Since she had mild impaired glucose tolerance she was started on metformin ER 1000 mg daily on 06/02/15 In 3/17 however she was having some nausea and this was stopped for some time She took some Phenergan for the nausea and does not have this now  Her baseline A1c before starting metformin in 1/17 was 6.1, previously in 10/16 was 5.8 and has been between 5.7-6.0 since 10/2013  Her glucose is 117 fasting, previously 111 A1c is about the same  Previous weight history: 139-143  Wt Readings from Last 3 Encounters:  09/03/15 145 lb (65.772 kg)  07/25/15 142 lb (64.411 kg)  07/02/15 141 lb (63.957 kg)    Lab Results  Component Value Date   HGBA1C 6.2 08/29/2015   Lab Results  Component Value Date   [place] 97 12/13/2014   CREATININE 0.57* 06/27/2015      Past Medical History  Diagnosis Date  . Complication of  anesthesia 12/2009    "had endoscopy; larynx went into spasms; stopped breathing for 15 seconds" (05/13/2012)  . Hypercholesteremia   . Hypertension   . GERD (gastroesophageal reflux disease) 2011  . Abnormal finding on EKG, new anterolateral T-wave inversions  05/14/2012  . Abnormal nuclear stress test, with infero-lateral ischemia 05/14/2012  . Atypical angina ([place]) 05/14/2012  . CAD (coronary artery disease), 05/13/12, with 80% LAD 05/14/2012  . S/P angioplasty with stent, LAD 05/13/12 05/14/2012  . Tuberculosis     test +- [place] [facility] - Dr. [name]- CXR- OK  . Arthritis     "right thumb; all my fingers" (05/13/2012), cervical spondylosis   . CMC arthritis, thumb, degenerative   . Cancer ([place])     basal cell on facial in L temporal region      Past Surgical History  Procedure Laterality Date  . Tonsillectomy and adenoidectomy  1950's  . Inguinal hernia repair  ~ 1966; 07/21/2002    "right; left" (05/13/2012)  . Osteotomy and ulnar shortening  07/21/2002    "right" (05/13/2012)  . Forearm / wrist tendon lesion excision  ?11/2001    "right; did waver to try to get ulnar out of hole that it had cut" (05/13/2012  . Dilation and curettage of uterus  1960's; 1970's; 1980    "probably 3" (  05/13/2012)  . Tubal ligation  1980  . Coronary angioplasty with stent placement  05/13/2012    DES to LAD; she has total RCA with left to rt coll. normal LV function done for positive nuc study  . Anterior cervical decomp/discectomy fusion N/A 12/13/2013    Procedure: ANTERIOR CERVICAL DECOMPRESSION/DISCECTOMY FUSION 3 LEVELS Cervical four/five,five/six,six/seven. Anterior cervical disectomy and fusion with peek and plate ;  Surgeon: [name], MD;  Location: [place] [facility];  Service: Neurosurgery;  Laterality: N/A;  . Left heart catheterization with coronary angiogram N/A 05/13/2012    Procedure: LEFT HEART CATHETERIZATION WITH CORONARY ANGIOGRAM;  Surgeon: [name], MD;  Location: [facility] CATH LAB;   Service: Cardiovascular;  Laterality: N/A;  . Cardiac catheterization N/A 07/02/2015    Procedure: Left Heart Cath and Coronary Angiography;  Surgeon: [name], MD;  Location: [place] CV LAB;  Service: Cardiovascular;  Laterality: N/A;    Family History  Problem Relation Age of Onset  . Hypertension Mother   . Diabetes Mother   . Hyperlipidemia Father   . Heart disease Father   . Stroke Father   . Heart disease Sister   . Diabetes Sister   . Heart disease Brother   . Diabetes Maternal Aunt     Social History:  reports that she quit smoking about 27 years ago. Her smoking use included Cigarettes. She has a 10 pack-year smoking history. She has never used smokeless tobacco. She reports that she drinks about 7.2 oz of alcohol per week. She reports that she does not use illicit drugs.  Allergies:  Allergies  Allergen Reactions  . Statins Other (See Comments)    "intermittent loss of circulation; hands and arms will go to sleep; feet will get cramps; fatigue" (05/13/2012).  This occurred with Lipitor, Zocor, Crestor 5 mg qd, and she thinks pravastatin as well  . Amlodipine Itching  . Gadolinium Derivatives Itching and Nausea Only    Pt had severe nausea and itching on legs, neck and back, per dr [name] pt needs 13 hour prep before contrast in the future      Medication List       This list is accurate as of: 09/03/15  1:48 PM.  Always use your most recent med list.               acetaminophen 500 MG tablet  Commonly known as:  TYLENOL  Take 500 mg by mouth every 6 (six) hours as needed for mild pain or headache.     aspirin 81 MG tablet  Take 81 mg by mouth daily.     calcium-vitamin D 500-200 MG-UNIT tablet  Commonly known as:  OSCAL WITH D  Take 2 tablets by mouth daily.     carvedilol 6.25 MG tablet  Commonly known as:  COREG  Take 6.25 mg by mouth 2 (two) times daily with a meal.     clopidogrel 75 MG tablet  Commonly known as:  PLAVIX  Take 75 mg by  mouth daily.     diclofenac sodium 1 % Gel  Commonly known as:  VOLTAREN  Apply 2 g topically daily as needed. For pain     hydrocortisone valerate cream 0.2 %  Commonly known as:  WESTCORT  Apply 1 application topically daily as needed (ITCHING).     Investigational - Study Medication  Take 1 tablet by mouth daily. Bempedoic Acid 180 mg Tablet or Placebo Tablet for Oral Use     irbesartan  150 MG tablet  Commonly known as:  AVAPRO  TAKE 1 TABLET BY MOUTH EVERY DAY     metFORMIN 500 MG 24 hr tablet  Commonly known as:  GLUCOPHAGE-XR  Take 500 mg by mouth 2 (two) times daily. Reported on 09/03/2015     multivitamin with minerals Tabs tablet  Take 1 tablet by mouth daily.     NITROSTAT 0.4 MG SL tablet  Generic drug:  nitroGLYCERIN  PLACE 1 TABLET UNDER THE TONGUE EVERY 5 MINTUES AS NEEDED FOR CHEST PAIN     pravastatin 40 MG tablet  Commonly known as:  PRAVACHOL  Take 1 tablet (40 mg total) by mouth every evening.     promethazine 12.5 MG tablet  Commonly known as:  PHENERGAN  Take 1 tablet (12.5 mg total) by mouth every 8 (eight) hours as needed for nausea or vomiting.     Vitamin D3 2000 units Tabs  Take 2,000 Units by mouth 2 (two) times daily.     ZETIA 10 MG tablet  Generic drug:  ezetimibe  Take 1 tablet (10 mg total) by mouth daily.        LABS:  Lab on 08/29/2015  Component Date Value Ref Range Status  . Hgb A1c MFr Bld 08/29/2015 6.2  4.6 - 6.5 % Final   Glycemic Control Guidelines for People with Diabetes:Non Diabetic:  <6%Goal of Therapy: <7%Additional Action Suggested:  >8%   . Glucose, Bld 08/29/2015 117* 70 - 99 mg/dL Final     REVIEW OF SYSTEMS:        Review of Systems  Respiratory: Positive for shortness of breath.    Other active medical issues include:  Hypertension, treated with Avaproand Coreg  History of osteopenia  History of hyperlipidemia with intolerance to statins, currently on pravastatin and Zetia   PHYSICAL EXAM:  BP  124/68 mmHg  Pulse 80  Temp(Src) 98.3 F (36.8 C)  Resp 14  Ht 5\' 1"  (1.549 m)  Wt 145 lb (65.772 kg)  BMI 27.41 kg/m2  SpO2 97%   Exam not indicated  ASSESSMENT:   Prediabetes: She does have impaired fasting glucose and also impaired glucose tolerance She did not seem to benefit with metformin low doses as A1c is about same 6.2 Also fasting glucose is 117 now She thinks she is doing fairly well with diet and exercise, has difficulty losing weight  Not clear if she was having nausea from metformin as she had taken this without any problems for about 2 months initially  PLAN:   Trial of metformin again.  She can try doing this at bedtime instead of breakfast and dinnertime for better tolerability She may be able to take the 1500 mg dose if after 2 weeks she can continue without nausea She will take the third tablet after lunch  Recheck A1c and fasting glucose in 3 months     Daison Braxton 09/03/2015, 1:48 PM

## 2015-09-03 NOTE — Patient Instructions (Signed)
Metformin 1 after supper for 5 days then try 2 pills after supper  In 2 weeks try 3rd pill after lunch

## 2015-09-04 DIAGNOSIS — M9902 Segmental and somatic dysfunction of thoracic region: Secondary | ICD-10-CM | POA: Diagnosis not present

## 2015-09-04 DIAGNOSIS — M9903 Segmental and somatic dysfunction of lumbar region: Secondary | ICD-10-CM | POA: Diagnosis not present

## 2015-09-04 DIAGNOSIS — M9905 Segmental and somatic dysfunction of pelvic region: Secondary | ICD-10-CM | POA: Diagnosis not present

## 2015-09-04 DIAGNOSIS — S338XXA Sprain of other parts of lumbar spine and pelvis, initial encounter: Secondary | ICD-10-CM | POA: Diagnosis not present

## 2015-09-04 DIAGNOSIS — S29012A Strain of muscle and tendon of back wall of thorax, initial encounter: Secondary | ICD-10-CM | POA: Diagnosis not present

## 2015-09-04 DIAGNOSIS — S39012A Strain of muscle, fascia and tendon of lower back, initial encounter: Secondary | ICD-10-CM | POA: Diagnosis not present

## 2015-09-21 DIAGNOSIS — I2510 Atherosclerotic heart disease of native coronary artery without angina pectoris: Secondary | ICD-10-CM | POA: Diagnosis not present

## 2015-09-21 DIAGNOSIS — I2583 Coronary atherosclerosis due to lipid rich plaque: Secondary | ICD-10-CM | POA: Diagnosis not present

## 2015-09-21 DIAGNOSIS — E785 Hyperlipidemia, unspecified: Secondary | ICD-10-CM | POA: Diagnosis not present

## 2015-09-21 DIAGNOSIS — Z5181 Encounter for therapeutic drug level monitoring: Secondary | ICD-10-CM | POA: Diagnosis not present

## 2015-09-22 LAB — LIPID PANEL
Cholesterol: 243 mg/dL — ABNORMAL HIGH (ref 125–200)
HDL: 39 mg/dL — ABNORMAL LOW (ref >=46)
LDL Cholesterol: 139 mg/dL — ABNORMAL HIGH (ref <130)
Total CHOL/HDL Ratio: 6.2 Ratio — ABNORMAL HIGH (ref <=5.0)
Triglycerides: 323 mg/dL — ABNORMAL HIGH (ref <150)
VLDL: 65 mg/dL — ABNORMAL HIGH (ref <30)

## 2015-09-25 DIAGNOSIS — S338XXA Sprain of other parts of lumbar spine and pelvis, initial encounter: Secondary | ICD-10-CM | POA: Diagnosis not present

## 2015-09-25 DIAGNOSIS — S39012A Strain of muscle, fascia and tendon of lower back, initial encounter: Secondary | ICD-10-CM | POA: Diagnosis not present

## 2015-09-25 DIAGNOSIS — M9905 Segmental and somatic dysfunction of pelvic region: Secondary | ICD-10-CM | POA: Diagnosis not present

## 2015-09-25 DIAGNOSIS — M9903 Segmental and somatic dysfunction of lumbar region: Secondary | ICD-10-CM | POA: Diagnosis not present

## 2015-09-25 DIAGNOSIS — M9902 Segmental and somatic dysfunction of thoracic region: Secondary | ICD-10-CM | POA: Diagnosis not present

## 2015-09-25 DIAGNOSIS — S29012A Strain of muscle and tendon of back wall of thorax, initial encounter: Secondary | ICD-10-CM | POA: Diagnosis not present

## 2015-09-26 ENCOUNTER — Telehealth: Payer: Self-pay | Admitting: Cardiovascular Disease

## 2015-09-26 ENCOUNTER — Other Ambulatory Visit: Payer: Self-pay | Admitting: Internal Medicine

## 2015-09-26 DIAGNOSIS — E785 Hyperlipidemia, unspecified: Secondary | ICD-10-CM

## 2015-09-26 DIAGNOSIS — M8580 Other specified disorders of bone density and structure, unspecified site: Secondary | ICD-10-CM

## 2015-09-26 DIAGNOSIS — Z79899 Other long term (current) drug therapy: Secondary | ICD-10-CM

## 2015-09-26 MED ORDER — ATORVASTATIN CALCIUM 10 MG PO TABS: 10.0000 mg | ORAL_TABLET | Freq: Every day | ORAL | Status: DC

## 2015-09-26 MED ORDER — FENOFIBRATE 145 MG PO TABS: 145.0000 mg | ORAL_TABLET | Freq: Every day | ORAL | Status: DC

## 2015-09-26 NOTE — Telephone Encounter (Signed)
Returned call to patient. She states she saw her lipid readings in MyChart.  She is allergic to statin medications - she has trouble with "all of them" She states her feet are doing the same thing on pravastatin that they did on livalo - "feet killing her all the time" "sometimes she can barely walk" When she got off livalo, her feet were much better She states she has some memory issues too She states she is in a blind study at [facility] for cholesterol   -- Bempedoic Acid - she is taking this in blind study  -- she states she is looking at her numbers and she feels she is getting the placebo based on her lab results  -- she would like to drop out of the study, but would like to help Dr. Glennon Hamilton out  Patient has been looking for alternatives:  Lovastatin, Gemfibrozil are on formulary - along with crestor and lipitor - crestor caused elevated in LFTs -- patient took simvastatin which bothered her -- vytorin is on the formulary -- patient states in years past she took lipitor and her former PCP Dr. Clair Gulling said she did OK on this  Patient states she cannot afford PCS-K9 medications at this time - co-pays range $50-$150/month - she cannot afford a higher co-pay  Patient states she had been trying to lose weight and exercise but her feet pain prohibits this - patient watches diet, eats veggies, is pre-diabetic, does not eat sweats/junk food  Patient wants a different medication called in for her. She is very concerned about her medications.  Advised her that I personally cannot call in a different Rx for her but I would send the message to Dr. Gwenlyn Found, clinical pharmacy staff, Maudie Mercury, RN for recommendations.  She is going out of town and wants a new Rx - advised will be OK to take her current meds for a few more days if we are unable to contact her back with recommendations today

## 2015-09-26 NOTE — Telephone Encounter (Signed)
New message     Per pt. They saw on "My chart" cholesterol level was high regarding.

## 2015-09-26 NOTE — Telephone Encounter (Signed)
Returned call to patient she is wanting to retry atorvastatin. Her feet have been burning with the pravastatin. She is requesting we start at a low dose thus will start atorvastatin 10 mg daily. I reiterated that statin medications are not effective at lowering triglycerides. She would like to try gemfibrozil - though I believe this medication will probably not be allowed with the study and given her history for myalgias I would be hesitant to start with lipitor. Feel that fenofibrate maybe more appropriate for TG management though I also suspect this medication will not be allowed with the study either. She requested I send in the prescription and she would call the study nurse to find out if both the atorvastatin and fenofibrate are allowed while in the study. She stated "her health is more important than being in the study." She also stated that she would likely drop out of the study if they did not allow these medications. I instructed her to call our office tomorrow and let us know if she chose to try the medications as we will need to follow up with labs in 3 months.

## 2015-09-28 ENCOUNTER — Encounter (HOSPITAL_COMMUNITY): Payer: Self-pay

## 2015-09-28 MED ORDER — FENOFIBRATE 160 MG PO TABS: 160.0000 mg | ORAL_TABLET | Freq: Every day | ORAL | Status: DC

## 2015-09-28 NOTE — Telephone Encounter (Signed)
Pt orders placed for lipid/liver in 3 months. Lab slips mailed to pt with instructions

## 2015-09-28 NOTE — Addendum Note (Signed)
Addended by: Erskine Emery on: 09/28/2015 01:13 PM   Modules accepted: Orders

## 2015-09-28 NOTE — Telephone Encounter (Signed)
Patient returned call stating that fenofibrate was not covered by insurance. I called in a different strength to the [facility] and per the pharmacist that strength is covered. LM for patient telling her that new strength was covered and for her to return call letting us know about the study.

## 2015-10-09 ENCOUNTER — Telehealth: Payer: Self-pay | Admitting: Pharmacist

## 2015-10-09 NOTE — Telephone Encounter (Signed)
Pt called to inform us that she has dropped out of the study. She will start taking the lipitor and fenofibrate later this week. Instructed her to call if she has any issues with these medications.

## 2015-10-16 ENCOUNTER — Other Ambulatory Visit: Payer: Medicare Other

## 2015-10-16 DIAGNOSIS — M8589 Other specified disorders of bone density and structure, multiple sites: Secondary | ICD-10-CM | POA: Diagnosis not present

## 2015-10-19 ENCOUNTER — Telehealth: Payer: Self-pay | Admitting: Pharmacist Clinician (PhC)/ Clinical Pharmacy Specialist

## 2015-10-19 NOTE — Telephone Encounter (Signed)
Patient called, reports that she is now taking atorvastatin 20 mg daily and fenofibrate 160 mg daily.  She reports some mild muscle aches from the atorvastatin and would like to switch to every other day.  She also has noticed bloating, which she was told could be a side effect from the fenofibrate.  Advised that this can happen, and until it settles she would be fine to take the fenofibrate every other day as well.

## 2015-10-23 DIAGNOSIS — M9903 Segmental and somatic dysfunction of lumbar region: Secondary | ICD-10-CM | POA: Diagnosis not present

## 2015-10-23 DIAGNOSIS — S338XXA Sprain of other parts of lumbar spine and pelvis, initial encounter: Secondary | ICD-10-CM | POA: Diagnosis not present

## 2015-10-23 DIAGNOSIS — M9905 Segmental and somatic dysfunction of pelvic region: Secondary | ICD-10-CM | POA: Diagnosis not present

## 2015-10-23 DIAGNOSIS — M9902 Segmental and somatic dysfunction of thoracic region: Secondary | ICD-10-CM | POA: Diagnosis not present

## 2015-10-23 DIAGNOSIS — S29012A Strain of muscle and tendon of back wall of thorax, initial encounter: Secondary | ICD-10-CM | POA: Diagnosis not present

## 2015-10-23 DIAGNOSIS — S39012A Strain of muscle, fascia and tendon of lower back, initial encounter: Secondary | ICD-10-CM | POA: Diagnosis not present

## 2015-10-26 ENCOUNTER — Telehealth: Payer: Self-pay | Admitting: Endocrinology

## 2015-10-26 ENCOUNTER — Telehealth: Payer: Self-pay | Admitting: Pharmacist

## 2015-10-26 NOTE — Telephone Encounter (Signed)
See note below and please advise, Thanks! 

## 2015-10-26 NOTE — Telephone Encounter (Signed)
I contacted the pt and advised of note below and she voiced understanding.  

## 2015-10-26 NOTE — Telephone Encounter (Signed)
Returned call to patient about itching eyes, cheek bones, and runny nose. She has been off fenofibrate for 2 days. She states she is having to take benedryl and eye drops for the itching which only provides some relief. She denies having seasonal allergies and thinks it may be the fenofibrate. Instructed her it is reasonable to do trial off medication and see if itching clears up. She will remain off medication and call if itching continues.

## 2015-10-26 NOTE — Telephone Encounter (Signed)
She has to discussed with PCP, not from my prescription

## 2015-10-26 NOTE — Telephone Encounter (Signed)
Patient stated that she is having trouble with her eye itching really bad burning even on her cheeks, this have going on since the last of may, she think she is having a allergic reaction to the medication, not sure which medication. please advise

## 2015-10-29 ENCOUNTER — Other Ambulatory Visit: Payer: Self-pay | Admitting: Obstetrics & Gynecology

## 2015-10-29 DIAGNOSIS — Z1231 Encounter for screening mammogram for malignant neoplasm of breast: Secondary | ICD-10-CM | POA: Diagnosis not present

## 2015-10-29 DIAGNOSIS — Z6827 Body mass index (BMI) 27.0-27.9, adult: Secondary | ICD-10-CM | POA: Diagnosis not present

## 2015-10-29 DIAGNOSIS — Z124 Encounter for screening for malignant neoplasm of cervix: Secondary | ICD-10-CM | POA: Diagnosis not present

## 2015-10-30 LAB — CYTOLOGY - PAP

## 2015-10-31 DIAGNOSIS — H10413 Chronic giant papillary conjunctivitis, bilateral: Secondary | ICD-10-CM | POA: Diagnosis not present

## 2015-11-29 ENCOUNTER — Other Ambulatory Visit (INDEPENDENT_AMBULATORY_CARE_PROVIDER_SITE_OTHER): Payer: Medicare Other

## 2015-11-29 DIAGNOSIS — R7301 Impaired fasting glucose: Secondary | ICD-10-CM | POA: Diagnosis not present

## 2015-11-29 LAB — BASIC METABOLIC PANEL
BUN: 11 mg/dL (ref 6–23)
CO2: 29 mEq/L (ref 19–32)
Calcium: 9.6 mg/dL (ref 8.4–10.5)
Chloride: 104 mEq/L (ref 96–112)
Creatinine, Ser: 0.57 mg/dL (ref 0.40–1.20)
GFR: 110.56 mL/min (ref >60.00)
Glucose, Bld: 122 mg/dL — ABNORMAL HIGH (ref 70–99)
Potassium: 4.2 mEq/L (ref 3.5–5.1)
Sodium: 138 mEq/L (ref 135–145)

## 2015-11-29 LAB — HEMOGLOBIN A1C: Hgb A1c MFr Bld: 5.7 % (ref 4.6–6.5)

## 2015-11-30 ENCOUNTER — Other Ambulatory Visit: Payer: Medicare Other

## 2015-11-30 DIAGNOSIS — S39012A Strain of muscle, fascia and tendon of lower back, initial encounter: Secondary | ICD-10-CM | POA: Diagnosis not present

## 2015-11-30 DIAGNOSIS — M9902 Segmental and somatic dysfunction of thoracic region: Secondary | ICD-10-CM | POA: Diagnosis not present

## 2015-11-30 DIAGNOSIS — M9905 Segmental and somatic dysfunction of pelvic region: Secondary | ICD-10-CM | POA: Diagnosis not present

## 2015-11-30 DIAGNOSIS — M9903 Segmental and somatic dysfunction of lumbar region: Secondary | ICD-10-CM | POA: Diagnosis not present

## 2015-11-30 DIAGNOSIS — S29012A Strain of muscle and tendon of back wall of thorax, initial encounter: Secondary | ICD-10-CM | POA: Diagnosis not present

## 2015-11-30 DIAGNOSIS — S338XXA Sprain of other parts of lumbar spine and pelvis, initial encounter: Secondary | ICD-10-CM | POA: Diagnosis not present

## 2015-12-04 ENCOUNTER — Ambulatory Visit (INDEPENDENT_AMBULATORY_CARE_PROVIDER_SITE_OTHER): Payer: Medicare Other | Admitting: Endocrinology

## 2015-12-04 ENCOUNTER — Encounter: Payer: Self-pay | Admitting: Endocrinology

## 2015-12-04 VITALS — BP 122/74 | HR 72 | Ht 61.0 in | Wt 145.0 lb

## 2015-12-04 DIAGNOSIS — I2510 Atherosclerotic heart disease of native coronary artery without angina pectoris: Secondary | ICD-10-CM

## 2015-12-04 DIAGNOSIS — R7303 Prediabetes: Secondary | ICD-10-CM

## 2015-12-04 NOTE — Patient Instructions (Signed)
Take Metformin at [facility] and lunch  May need OTC Prilosec

## 2015-12-04 NOTE — Progress Notes (Signed)
Patient ID: Brittany Figueroa, female   DOB: 1940/09/07, 75 y.o.   MRN: [id number]           Chief complaint:  Follow-up of prediabetes  History of Present Illness:  She was told by her PCP that she has type 2 diabetes based on an A1c of 6.5% in 07/2013 She has not had any other A1c is subsequently above normal, normal range and previous lab 6.2 or less She also has not had any abnormal blood sugars except a glucose of 125 in 3/16  She does have increased fasting blood sugars ranging from 108-125 since about 2012  She may have been told about 10 years ago that she had prediabetes and reportedly had a normal glucose tolerance test.  RECENT history:  GLUCOSE TOLERANCE test on 05/23/15: Fasting glucose 110, two-hour glucose 192   With her A1c of 6.2 she was started on metformin ER again in 4/17 She is taking 1500 mg a day with 2 tablets of metformin ER at dinnertime She is not sure if this is causing heartburn after supper  Although her fasting glucose is unusually high at 122 her A1c is better at 5.7 Has not lost any weight She says she has limited carbohydrates and no added sugar in her meals and drinks She does try to follow a heart healthy diet She is trying to do some walking, is limited by pain in her feet and legs, may walk up to 35 minutes   Previous weight history: 139-143  Wt Readings from Last 3 Encounters:  12/04/15 145 lb (65.772 kg)  09/03/15 145 lb (65.772 kg)  07/25/15 142 lb (64.411 kg)    Lab Results  Component Value Date   HGBA1C 5.7 11/29/2015   HGBA1C 6.2 08/29/2015   Lab Results  Component Value Date   LDLCALC 139* 09/21/2015   CREATININE 0.57 11/29/2015     Lab on 11/29/2015  Component Date Value Ref Range Status  . Hgb A1c MFr Bld 11/29/2015 5.7  4.6 - 6.5 % Final   Glycemic Control Guidelines for People with Diabetes:Non Diabetic:  <6%Goal of Therapy: <7%Additional Action Suggested:  >8%   . Sodium 11/29/2015 138  135 - 145 mEq/L Final  .  Potassium 11/29/2015 4.2  3.5 - 5.1 mEq/L Final  . Chloride 11/29/2015 104  96 - 112 mEq/L Final  . CO2 11/29/2015 29  19 - 32 mEq/L Final  . Glucose, Bld 11/29/2015 122* 70 - 99 mg/dL Final  . BUN 11/29/2015 11  6 - 23 mg/dL Final  . Creatinine, Ser 11/29/2015 0.57  0.40 - 1.20 mg/dL Final  . Calcium 11/29/2015 9.6  8.4 - 10.5 mg/dL Final  . GFR 11/29/2015 110.56  >60.00 mL/min Final      Past Medical History  Diagnosis Date  . Complication of anesthesia 12/2009    "had endoscopy; larynx went into spasms; stopped breathing for 15 seconds" (05/13/2012)  . Hypercholesteremia   . Hypertension   . GERD (gastroesophageal reflux disease) 2011  . Abnormal finding on EKG, new anterolateral T-wave inversions  05/14/2012  . Abnormal nuclear stress test, with infero-lateral ischemia 05/14/2012  . Atypical angina ([name]) 05/14/2012  . CAD (coronary artery disease), 05/13/12, with 80% LAD 05/14/2012  . S/P angioplasty with stent, LAD 05/13/12 05/14/2012  . Tuberculosis     test +- [place] [facility] - Dr. [name]- CXR- OK  . Arthritis     "right thumb; all my fingers" (05/13/2012), cervical spondylosis   . CMC arthritis, thumb,  degenerative   . Cancer ([name])     basal cell on facial in L temporal region      Past Surgical History  Procedure Laterality Date  . Tonsillectomy and adenoidectomy  1950's  . Inguinal hernia repair  ~ 1966; 07/21/2002    "right; left" (05/13/2012)  . Osteotomy and ulnar shortening  07/21/2002    "right" (05/13/2012)  . Forearm / wrist tendon lesion excision  ?11/2001    "right; did waver to try to get ulnar out of hole that it had cut" (05/13/2012  . Dilation and curettage of uterus  1960's; 1970's; 1980    "probably 3" (05/13/2012)  . Tubal ligation  1980  . Coronary angioplasty with stent placement  05/13/2012    DES to LAD; she has total RCA with left to rt coll. normal LV function done for positive nuc study  . Anterior cervical decomp/discectomy fusion N/A 12/13/2013     Procedure: ANTERIOR CERVICAL DECOMPRESSION/DISCECTOMY FUSION 3 LEVELS Cervical four/five,five/six,six/seven. Anterior cervical disectomy and fusion with peek and plate ;  Surgeon: [name], MD;  Location: [place] [facility];  Service: Neurosurgery;  Laterality: N/A;  . Left heart catheterization with coronary angiogram N/A 05/13/2012    Procedure: LEFT HEART CATHETERIZATION WITH CORONARY ANGIOGRAM;  Surgeon: [name], MD;  Location: [facility] CATH LAB;  Service: Cardiovascular;  Laterality: N/A;  . Cardiac catheterization N/A 07/02/2015    Procedure: Left Heart Cath and Coronary Angiography;  Surgeon: [name], MD;  Location: [place] CV LAB;  Service: Cardiovascular;  Laterality: N/A;    Family History  Problem Relation Age of Onset  . Hypertension Mother   . Diabetes Mother   . Hyperlipidemia Father   . Heart disease Father   . Stroke Father   . Heart disease Sister   . Diabetes Sister   . Heart disease Brother   . Diabetes Maternal Aunt     Social History:  reports that she quit smoking about 27 years ago. Her smoking use included Cigarettes. She has a 10 pack-year smoking history. She has never used smokeless tobacco. She reports that she drinks about 7.2 oz of alcohol per week. She reports that she does not use illicit drugs.  Allergies:  Allergies  Allergen Reactions  . Statins Other (See Comments)    "intermittent loss of circulation; hands and arms will go to sleep; feet will get cramps; fatigue" (05/13/2012).  This occurred with Lipitor, Zocor, Crestor 5 mg qd, and she thinks pravastatin as well  . Amlodipine Itching  . Gadolinium Derivatives Itching and Nausea Only    Pt had severe nausea and itching on legs, neck and back, per dr [name] pt needs 13 hour prep before contrast in the future      Medication List       This list is accurate as of: 12/04/15 10:31 AM.  Always use your most recent med list.               acetaminophen 500 MG tablet  Commonly  known as:  TYLENOL  Take 500 mg by mouth every 6 (six) hours as needed for mild pain or headache.     aspirin 81 MG tablet  Take 81 mg by mouth daily.     atorvastatin 10 MG tablet  Commonly known as:  LIPITOR  Take 1 tablet (10 mg total) by mouth daily.     calcium-vitamin D 500-200 MG-UNIT tablet  Commonly known as:  OSCAL WITH D  Take  2 tablets by mouth daily.     carvedilol 6.25 MG tablet  Commonly known as:  COREG  Take 6.25 mg by mouth 2 (two) times daily with a meal.     clopidogrel 75 MG tablet  Commonly known as:  PLAVIX  Take 75 mg by mouth daily.     diclofenac sodium 1 % Gel  Commonly known as:  VOLTAREN  Apply 2 g topically daily as needed. For pain     fenofibrate 160 MG tablet  Take 1 tablet (160 mg total) by mouth daily.     hydrocortisone valerate cream 0.2 %  Commonly known as:  WESTCORT  Apply 1 application topically daily as needed (ITCHING).     Investigational - Study Medication  Take 1 tablet by mouth daily. Reported on 12/04/2015     irbesartan 150 MG tablet  Commonly known as:  AVAPRO  TAKE 1 TABLET BY MOUTH EVERY DAY     metFORMIN 500 MG 24 hr tablet  Commonly known as:  GLUCOPHAGE-XR  TAKE 1 TABLET BY MOUTH DAILY WITH FOOD FOR 1 WEEK, THEN INCREASE TO 2 TABLETS DAILY WITH FOOD     multivitamin with minerals Tabs tablet  Take 1 tablet by mouth daily.     NITROSTAT 0.4 MG SL tablet  Generic drug:  nitroGLYCERIN  PLACE 1 TABLET UNDER THE TONGUE EVERY 5 MINTUES AS NEEDED FOR CHEST PAIN     Vitamin D3 2000 units Tabs  Take 2,000 Units by mouth 2 (two) times daily.     ZETIA 10 MG tablet  Generic drug:  ezetimibe  Take 1 tablet (10 mg total) by mouth daily.        LABS:  Lab on 11/29/2015  Component Date Value Ref Range Status  . Hgb A1c MFr Bld 11/29/2015 5.7  4.6 - 6.5 % Final   Glycemic Control Guidelines for People with Diabetes:Non Diabetic:  <6%Goal of Therapy: <7%Additional Action Suggested:  >8%   . Sodium 11/29/2015 138   135 - 145 mEq/L Final  . Potassium 11/29/2015 4.2  3.5 - 5.1 mEq/L Final  . Chloride 11/29/2015 104  96 - 112 mEq/L Final  . CO2 11/29/2015 29  19 - 32 mEq/L Final  . Glucose, Bld 11/29/2015 122* 70 - 99 mg/dL Final  . BUN 11/29/2015 11  6 - 23 mg/dL Final  . Creatinine, Ser 11/29/2015 0.57  0.40 - 1.20 mg/dL Final  . Calcium 11/29/2015 9.6  8.4 - 10.5 mg/dL Final  . GFR 11/29/2015 110.56  >60.00 mL/min Final     REVIEW OF SYSTEMS:        Review of Systems  Other active medical issues include:  Hypertension, treated with Avapro and Coreg  History of osteopenia  History of hyperlipidemia with intolerance to statins, currently on Fenofibrate although irregular on this.  Also on Zetia  Nausea in the mornings from [place]:  BP 122/74 mmHg  Pulse 72  Ht 5\' 1"  (1.549 m)  Wt 145 lb (65.772 kg)  BMI 27.41 kg/m2  SpO2 97%    ASSESSMENT:   Prediabetes with impaired fasting glucose and also impaired glucose tolerance  A1c is significantly better at 5.7 compared to 6.2 with starting metformin ER 1500 mg a day She thinks she has got some heartburn in the evenings after taking metformin but not clear if this is related  However not clear why fasting glucose is higher at 122 now She thinks she is doing fairly well with diet and  exercise, has difficulty losing weight again   Hyperlipidemia reportedly with statin intolerance, followed by PCP  PLAN:   She can try to reduce metformin to 500 mg at dinnertime to see if her heartburn improves otherwise can take OTC Prilosec Can take alternatively 500 metformin at breakfast and lunch  Recheck A1c and fasting glucose in 3 months     Susana Duell 12/04/2015, 10:31 AM

## 2015-12-05 ENCOUNTER — Other Ambulatory Visit: Payer: Self-pay | Admitting: Cardiovascular Disease

## 2015-12-07 ENCOUNTER — Other Ambulatory Visit: Payer: Self-pay | Admitting: Cardiovascular Disease

## 2015-12-10 ENCOUNTER — Other Ambulatory Visit: Payer: Self-pay

## 2015-12-19 DIAGNOSIS — L570 Actinic keratosis: Secondary | ICD-10-CM | POA: Diagnosis not present

## 2015-12-19 DIAGNOSIS — L820 Inflamed seborrheic keratosis: Secondary | ICD-10-CM | POA: Diagnosis not present

## 2015-12-24 DIAGNOSIS — S338XXA Sprain of other parts of lumbar spine and pelvis, initial encounter: Secondary | ICD-10-CM | POA: Diagnosis not present

## 2015-12-24 DIAGNOSIS — Z79899 Other long term (current) drug therapy: Secondary | ICD-10-CM | POA: Diagnosis not present

## 2015-12-24 DIAGNOSIS — S29012A Strain of muscle and tendon of back wall of thorax, initial encounter: Secondary | ICD-10-CM | POA: Diagnosis not present

## 2015-12-24 DIAGNOSIS — S39012A Strain of muscle, fascia and tendon of lower back, initial encounter: Secondary | ICD-10-CM | POA: Diagnosis not present

## 2015-12-24 DIAGNOSIS — M9902 Segmental and somatic dysfunction of thoracic region: Secondary | ICD-10-CM | POA: Diagnosis not present

## 2015-12-24 DIAGNOSIS — M9903 Segmental and somatic dysfunction of lumbar region: Secondary | ICD-10-CM | POA: Diagnosis not present

## 2015-12-24 DIAGNOSIS — E785 Hyperlipidemia, unspecified: Secondary | ICD-10-CM | POA: Diagnosis not present

## 2015-12-24 DIAGNOSIS — M9905 Segmental and somatic dysfunction of pelvic region: Secondary | ICD-10-CM | POA: Diagnosis not present

## 2015-12-24 LAB — HEPATIC FUNCTION PANEL
ALT: 16 U/L (ref 6–29)
AST: 14 U/L (ref 10–35)
Albumin: 3.9 g/dL (ref 3.6–5.1)
Alkaline Phosphatase: 60 U/L (ref 33–130)
Bilirubin, Direct: 0.1 mg/dL (ref <=0.2)
Indirect Bilirubin: 0.3 mg/dL (ref 0.2–1.2)
Total Bilirubin: 0.4 mg/dL (ref 0.2–1.2)
Total Protein: 6.4 g/dL (ref 6.1–8.1)

## 2015-12-24 LAB — LIPID PANEL
Cholesterol: 159 mg/dL (ref 125–200)
HDL: 45 mg/dL — ABNORMAL LOW (ref >=46)
LDL Cholesterol: 89 mg/dL (ref <130)
Total CHOL/HDL Ratio: 3.5 Ratio (ref <=5.0)
Triglycerides: 124 mg/dL (ref <150)
VLDL: 25 mg/dL (ref <30)

## 2015-12-26 DIAGNOSIS — E1121 Type 2 diabetes mellitus with diabetic nephropathy: Secondary | ICD-10-CM | POA: Diagnosis not present

## 2015-12-26 DIAGNOSIS — E1122 Type 2 diabetes mellitus with diabetic chronic kidney disease: Secondary | ICD-10-CM | POA: Diagnosis not present

## 2015-12-26 DIAGNOSIS — E785 Hyperlipidemia, unspecified: Secondary | ICD-10-CM | POA: Diagnosis not present

## 2015-12-26 DIAGNOSIS — E559 Vitamin D deficiency, unspecified: Secondary | ICD-10-CM | POA: Diagnosis not present

## 2015-12-26 DIAGNOSIS — I129 Hypertensive chronic kidney disease with stage 1 through stage 4 chronic kidney disease, or unspecified chronic kidney disease: Secondary | ICD-10-CM | POA: Diagnosis not present

## 2015-12-26 DIAGNOSIS — M8580 Other specified disorders of bone density and structure, unspecified site: Secondary | ICD-10-CM | POA: Diagnosis not present

## 2016-01-01 DIAGNOSIS — M19072 Primary osteoarthritis, left ankle and foot: Secondary | ICD-10-CM | POA: Diagnosis not present

## 2016-01-01 DIAGNOSIS — M216X1 Other acquired deformities of right foot: Secondary | ICD-10-CM | POA: Diagnosis not present

## 2016-01-01 DIAGNOSIS — M2011 Hallux valgus (acquired), right foot: Secondary | ICD-10-CM | POA: Diagnosis not present

## 2016-01-01 DIAGNOSIS — M2012 Hallux valgus (acquired), left foot: Secondary | ICD-10-CM | POA: Diagnosis not present

## 2016-01-01 DIAGNOSIS — M19071 Primary osteoarthritis, right ankle and foot: Secondary | ICD-10-CM | POA: Diagnosis not present

## 2016-01-01 DIAGNOSIS — M216X2 Other acquired deformities of left foot: Secondary | ICD-10-CM | POA: Diagnosis not present

## 2016-01-02 DIAGNOSIS — E785 Hyperlipidemia, unspecified: Secondary | ICD-10-CM | POA: Diagnosis not present

## 2016-01-02 DIAGNOSIS — I209 Angina pectoris, unspecified: Secondary | ICD-10-CM | POA: Diagnosis not present

## 2016-01-02 DIAGNOSIS — N182 Chronic kidney disease, stage 2 (mild): Secondary | ICD-10-CM | POA: Diagnosis not present

## 2016-01-02 DIAGNOSIS — R739 Hyperglycemia, unspecified: Secondary | ICD-10-CM | POA: Diagnosis not present

## 2016-01-10 ENCOUNTER — Other Ambulatory Visit: Payer: Self-pay | Admitting: Cardiovascular Disease

## 2016-01-18 ENCOUNTER — Other Ambulatory Visit: Payer: Self-pay

## 2016-01-18 ENCOUNTER — Telehealth: Payer: Self-pay | Admitting: *Deleted

## 2016-01-18 DIAGNOSIS — S338XXA Sprain of other parts of lumbar spine and pelvis, initial encounter: Secondary | ICD-10-CM | POA: Diagnosis not present

## 2016-01-18 DIAGNOSIS — S29012A Strain of muscle and tendon of back wall of thorax, initial encounter: Secondary | ICD-10-CM | POA: Diagnosis not present

## 2016-01-18 DIAGNOSIS — M9903 Segmental and somatic dysfunction of lumbar region: Secondary | ICD-10-CM | POA: Diagnosis not present

## 2016-01-18 DIAGNOSIS — M9905 Segmental and somatic dysfunction of pelvic region: Secondary | ICD-10-CM | POA: Diagnosis not present

## 2016-01-18 DIAGNOSIS — M9902 Segmental and somatic dysfunction of thoracic region: Secondary | ICD-10-CM | POA: Diagnosis not present

## 2016-01-18 DIAGNOSIS — S39012A Strain of muscle, fascia and tendon of lower back, initial encounter: Secondary | ICD-10-CM | POA: Diagnosis not present

## 2016-01-18 NOTE — Telephone Encounter (Signed)
[facility] generic Substitution Request form for zetia 10mg  tablets signed by Dr Gwenlyn Found to allow for generic substitution and faxed to number provided successfully.

## 2016-01-22 DIAGNOSIS — M216X2 Other acquired deformities of left foot: Secondary | ICD-10-CM | POA: Diagnosis not present

## 2016-01-22 DIAGNOSIS — M2012 Hallux valgus (acquired), left foot: Secondary | ICD-10-CM | POA: Diagnosis not present

## 2016-01-22 DIAGNOSIS — M19071 Primary osteoarthritis, right ankle and foot: Secondary | ICD-10-CM | POA: Diagnosis not present

## 2016-01-22 DIAGNOSIS — M2011 Hallux valgus (acquired), right foot: Secondary | ICD-10-CM | POA: Diagnosis not present

## 2016-01-22 DIAGNOSIS — M216X1 Other acquired deformities of right foot: Secondary | ICD-10-CM | POA: Diagnosis not present

## 2016-01-22 DIAGNOSIS — M19072 Primary osteoarthritis, left ankle and foot: Secondary | ICD-10-CM | POA: Diagnosis not present

## 2016-02-07 ENCOUNTER — Telehealth: Payer: Self-pay | Admitting: Endocrinology

## 2016-02-07 NOTE — Telephone Encounter (Signed)
Please see below and advise.

## 2016-02-07 NOTE — Telephone Encounter (Signed)
Patient stated that she think the metFORMIN (GLUCOPHAGE-XR) 500 MG 24 hr tablet  is making her swell, and feel tight,  look like she is pregnant. Please advise

## 2016-02-07 NOTE — Telephone Encounter (Signed)
She can try leaving it off for a couple of weeks.  If she does not feel any better she needs to go back on it

## 2016-02-08 NOTE — Telephone Encounter (Signed)
Patient called back

## 2016-02-08 NOTE — Telephone Encounter (Signed)
Detailed message left on patient's voicemail.

## 2016-02-10 NOTE — Telephone Encounter (Signed)
Please call her

## 2016-02-15 DIAGNOSIS — M9902 Segmental and somatic dysfunction of thoracic region: Secondary | ICD-10-CM | POA: Diagnosis not present

## 2016-02-15 DIAGNOSIS — S29012A Strain of muscle and tendon of back wall of thorax, initial encounter: Secondary | ICD-10-CM | POA: Diagnosis not present

## 2016-02-15 DIAGNOSIS — S338XXA Sprain of other parts of lumbar spine and pelvis, initial encounter: Secondary | ICD-10-CM | POA: Diagnosis not present

## 2016-02-15 DIAGNOSIS — S39012A Strain of muscle, fascia and tendon of lower back, initial encounter: Secondary | ICD-10-CM | POA: Diagnosis not present

## 2016-02-15 DIAGNOSIS — M9905 Segmental and somatic dysfunction of pelvic region: Secondary | ICD-10-CM | POA: Diagnosis not present

## 2016-02-15 DIAGNOSIS — M9903 Segmental and somatic dysfunction of lumbar region: Secondary | ICD-10-CM | POA: Diagnosis not present

## 2016-02-26 ENCOUNTER — Telehealth: Payer: Self-pay | Admitting: Cardiovascular Disease

## 2016-02-26 NOTE — Telephone Encounter (Signed)
Patient called bc her BPs have been running high. She has checked in AM and PM. Notes she takes irbesartan in the morning, coreg BID.  she is concerned bc BP is "in the danger zone" - was 193/97 when checked in pharmacy. She notes her readings have been elevated since she started rechecking daily 5 days ago. Advised if symptoms to go to ED. O/w, she does have an appt on 10/12 w Kristin to discuss BP management. Aware I will route to get any advice in interim. Pt voiced thanks and acknowledges she can be reached at number listed.

## 2016-02-27 NOTE — Telephone Encounter (Signed)
Spoke to patient and pressures have improved today.   171/91 this morning - this afternoon her pressure have come down to 150s/80s.  She is concerned about her pressures jumping up this way. She also questions what would make her higher in the morning. We discussed dipping at night time and this may be why or dosing her pressure medications at night to control pressure in the morning  She reports she recently changed her irbesartan to morning instead of evening.   Will have her keep medications as they are for this afternoon. We will adjust medications at visit tomorrow after we check cuff to ensure working properly.

## 2016-02-28 ENCOUNTER — Encounter: Payer: Self-pay | Admitting: Pharmacist

## 2016-02-28 ENCOUNTER — Ambulatory Visit (INDEPENDENT_AMBULATORY_CARE_PROVIDER_SITE_OTHER): Payer: Medicare Other | Admitting: Pharmacist

## 2016-02-28 VITALS — BP 154/78 | HR 70

## 2016-02-28 DIAGNOSIS — I2510 Atherosclerotic heart disease of native coronary artery without angina pectoris: Secondary | ICD-10-CM

## 2016-02-28 DIAGNOSIS — I10 Essential (primary) hypertension: Secondary | ICD-10-CM | POA: Diagnosis not present

## 2016-02-28 DIAGNOSIS — I2583 Coronary atherosclerosis due to lipid rich plaque: Secondary | ICD-10-CM

## 2016-02-28 MED ORDER — IRBESARTAN 300 MG PO TABS: 300.0000 mg | ORAL_TABLET | Freq: Every day | ORAL | 5 refills | Status: DC

## 2016-02-28 NOTE — Patient Instructions (Addendum)
Return for a a follow up appointment in 3 weeks  Check your blood pressure at home daily (if able) and keep record of the readings.  Take your BP meds as follows: INCREASE your irbesartan to 300mg  (2 tablets once a day until you pick up the higher dose).   Bring all of your meds, your BP cuff and your record of home blood pressures to your next appointment.  Exercise as you're able, try to walk approximately 30 minutes per day.  Keep salt intake to a minimum, especially watch canned and prepared boxed foods.  Eat more fresh fruits and vegetables and fewer canned items.  Avoid eating in fast food restaurants.    HOW TO TAKE YOUR BLOOD PRESSURE: . Rest 5 minutes before taking your blood pressure. .  Don't smoke or drink caffeinated beverages for at least 30 minutes before. . Take your blood pressure before (not after) you eat. . Sit comfortably with your back supported and both feet on the floor (don't cross your legs). . Elevate your arm to heart level on a table or a desk. . Use the proper sized cuff. It should fit smoothly and snugly around your bare upper arm. There should be enough room to slip a fingertip under the cuff. The bottom edge of the cuff should be 1 inch above the crease of the elbow. . Ideally, take 3 measurements at one sitting and record the average.

## 2016-02-28 NOTE — Progress Notes (Signed)
Patient ID: Brittany Figueroa                 DOB: Oct 10, 1940                      MRN: [id number]     HPI: Brittany Figueroa is a 75 y.o. female patient of Dr. Gwenlyn Found with [place] below who presents today for hypertension follow up.  Over last few days has been checking >6 times per day. She has been checking 3 - 5 times a day in each arm. She has concerns about her increasing blood pressure as well as concerns over increasing medications due to the risk of hypotension.   She states she has been having "pressure" in her head. She denies dizziness, SOB, chest pain.   Cardiac Hx: HTN, CAD, HLD  Current HTN meds:  Carvedilol 6.25mg  BID (730am, 730pm) irebesartan 150mg  daily in the morning  Previously tried:  Metoprolol - made her tired and gained weight  BP goal: <140/90 - prediabetes   Cardiac Hx: LAD stent 2013; hyperlipidemia; impaired glucose tolerance  Family Hx: mother and multiple siblings with hyperlipidemia, hypertension; knows little of father's family  Social Hx: no tobacco, drinks 1 glass red wine with dinner  Diet: uses some salt, but only on specific items  Exercise: none currently, burning sensation on feet not as bad as previously  Home BP readings:   Pressures are higher in the morning - as high as 193/97, usual pressures around 150s/90s, drops as low as 120/60s.  Wt Readings from Last 3 Encounters:  12/04/15 145 lb (65.8 kg)  09/03/15 145 lb (65.8 kg)  07/25/15 142 lb (64.4 kg)   BP Readings from Last 3 Encounters:  02/28/16 (!) 156/76  12/04/15 122/74  09/03/15 124/68   Pulse Readings from Last 3 Encounters:  02/28/16 70  12/04/15 72  09/03/15 80    Renal function: CrCl cannot be calculated (Unknown ideal weight.).  Past Medical History:  Diagnosis Date  . Abnormal finding on EKG, new anterolateral T-wave inversions  05/14/2012  . Abnormal nuclear stress test, with infero-lateral ischemia 05/14/2012  . Arthritis    "right thumb; all my fingers"  (05/13/2012), cervical spondylosis   . Atypical angina ([place]) 05/14/2012  . CAD (coronary artery disease), 05/13/12, with 80% LAD 05/14/2012  . Cancer ([place])    basal cell on facial in L temporal region    . CMC arthritis, thumb, degenerative   . Complication of anesthesia 12/2009   "had endoscopy; larynx went into spasms; stopped breathing for 15 seconds" (05/13/2012)  . GERD (gastroesophageal reflux disease) 2011  . Hypercholesteremia   . Hypertension   . S/P angioplasty with stent, LAD 05/13/12 05/14/2012  . Tuberculosis    test +- [place] [facility] - Dr. [name]- CXR- OK    Current Outpatient Prescriptions on File Prior to Visit  Medication Sig Dispense Refill  . acetaminophen (TYLENOL) 500 MG tablet Take 500 mg by mouth every 6 (six) hours as needed for mild pain or headache.    Marland Kitchen aspirin 81 MG tablet Take 81 mg by mouth daily.    Marland Kitchen atorvastatin (LIPITOR) 10 MG tablet Take 1 tablet (10 mg total) by mouth daily. 90 tablet 3  . calcium-vitamin D (OSCAL WITH D) 500-200 MG-UNIT per tablet Take 2 tablets by mouth daily.    . carvedilol (COREG) 6.25 MG tablet Take 6.25 mg by mouth 2 (two) times daily with a meal.     . Cholecalciferol (  VITAMIN D3) 2000 UNITS TABS Take 2,000 Units by mouth 2 (two) times daily.    . clopidogrel (PLAVIX) 75 MG tablet TAKE 1 TABLET BY MOUTH EVERY DAY 90 tablet 1  . diclofenac sodium (VOLTAREN) 1 % GEL Apply 2 g topically daily as needed. For pain    . fenofibrate 160 MG tablet Take 1 tablet (160 mg total) by mouth daily. 90 tablet 3  . hydrocortisone valerate cream (WESTCORT) 0.2 % Apply 1 application topically daily as needed (ITCHING).    . Multiple Vitamin (MULTIVITAMIN WITH MINERALS) TABS Take 1 tablet by mouth daily.    Marland Kitchen ZETIA 10 MG tablet Take 1 tablet (10 mg total) by mouth daily. 30 tablet 6  . nitroGLYCERIN (NITROSTAT) 0.4 MG SL tablet PLACE 1 TABLET UNDER THE TONGUE EVERY 5 MINUTES AS NEEDED FOR CHEST PAIN (Patient not taking: Reported on 02/28/2016) 25  tablet 0   No current facility-administered medications on file prior to visit.     Allergies  Allergen Reactions  . Statins Other (See Comments)    "intermittent loss of circulation; hands and arms will go to sleep; feet will get cramps; fatigue" (05/13/2012).  This occurred with Lipitor, Zocor, Crestor 5 mg qd, and she thinks pravastatin as well  . Amlodipine Itching  . Gadolinium Derivatives Itching and Nausea Only    Pt had severe nausea and itching on legs, neck and back, per dr [name] pt needs 13 hour prep before contrast in the future    Blood pressure (!) 156/76, pulse 70.   Assessment/Plan: Hypertension: Today in clinic, her BP is above goal. Additionally, her home readings are above goal. Discussed options with patient and recommend increasing her irbesartan dose to 300 mg daily. Patient agreeable to plan. She will continue to monitor at home.  Follow up with patient in 3 weeks for BP check and labs.    Thank you, Brittany Figueroa, [facility]  02/28/2016 1:46 PM

## 2016-02-29 ENCOUNTER — Telehealth: Payer: Self-pay | Admitting: Endocrinology

## 2016-02-29 NOTE — Telephone Encounter (Signed)
I contacted the patient. She has an upcoming appointment with Dr. Dwyane [name] on 03/06/2016 and agrees to stopping the metformin until her appointment so she can discuss with Dr. Dwyane [name].

## 2016-02-29 NOTE — Telephone Encounter (Signed)
Options: Trial off medication altogether, or: Trial of januvia Please let us know.

## 2016-02-29 NOTE — Telephone Encounter (Signed)
Metformin was definitely the culprit of the bloating, she has seen a huge improvement since coming off of it, so what is she to do now?

## 2016-02-29 NOTE — Telephone Encounter (Signed)
Could you please review and advise during Dr. Ronnie Derby absence, Thanks!

## 2016-03-03 ENCOUNTER — Other Ambulatory Visit (INDEPENDENT_AMBULATORY_CARE_PROVIDER_SITE_OTHER): Payer: Medicare Other

## 2016-03-03 DIAGNOSIS — R7303 Prediabetes: Secondary | ICD-10-CM

## 2016-03-03 DIAGNOSIS — M9902 Segmental and somatic dysfunction of thoracic region: Secondary | ICD-10-CM | POA: Diagnosis not present

## 2016-03-03 DIAGNOSIS — S338XXA Sprain of other parts of lumbar spine and pelvis, initial encounter: Secondary | ICD-10-CM | POA: Diagnosis not present

## 2016-03-03 DIAGNOSIS — M9903 Segmental and somatic dysfunction of lumbar region: Secondary | ICD-10-CM | POA: Diagnosis not present

## 2016-03-03 DIAGNOSIS — S39012A Strain of muscle, fascia and tendon of lower back, initial encounter: Secondary | ICD-10-CM | POA: Diagnosis not present

## 2016-03-03 DIAGNOSIS — S29012A Strain of muscle and tendon of back wall of thorax, initial encounter: Secondary | ICD-10-CM | POA: Diagnosis not present

## 2016-03-03 DIAGNOSIS — M9905 Segmental and somatic dysfunction of pelvic region: Secondary | ICD-10-CM | POA: Diagnosis not present

## 2016-03-03 LAB — BASIC METABOLIC PANEL
BUN: 19 mg/dL (ref 6–23)
CO2: 26 mEq/L (ref 19–32)
Calcium: 9.7 mg/dL (ref 8.4–10.5)
Chloride: 105 mEq/L (ref 96–112)
Creatinine, Ser: 0.72 mg/dL (ref 0.40–1.20)
GFR: 84.37 mL/min (ref >60.00)
Glucose, Bld: 121 mg/dL — ABNORMAL HIGH (ref 70–99)
Potassium: 4.1 mEq/L (ref 3.5–5.1)
Sodium: 137 mEq/L (ref 135–145)

## 2016-03-03 LAB — HEMOGLOBIN A1C: Hgb A1c MFr Bld: 6.2 % (ref 4.6–6.5)

## 2016-03-06 ENCOUNTER — Ambulatory Visit (INDEPENDENT_AMBULATORY_CARE_PROVIDER_SITE_OTHER): Payer: Medicare Other | Admitting: Endocrinology

## 2016-03-06 ENCOUNTER — Encounter: Payer: Self-pay | Admitting: Endocrinology

## 2016-03-06 VITALS — BP 138/82 | HR 72 | Temp 98.4°F | Resp 14 | Ht 61.0 in | Wt 144.2 lb

## 2016-03-06 DIAGNOSIS — R946 Abnormal results of thyroid function studies: Secondary | ICD-10-CM

## 2016-03-06 DIAGNOSIS — Z23 Encounter for immunization: Secondary | ICD-10-CM

## 2016-03-06 DIAGNOSIS — R7301 Impaired fasting glucose: Secondary | ICD-10-CM | POA: Diagnosis not present

## 2016-03-06 DIAGNOSIS — R7989 Other specified abnormal findings of blood chemistry: Secondary | ICD-10-CM

## 2016-03-06 NOTE — Progress Notes (Signed)
Patient ID: Brittany Figueroa, female   DOB: [birth year], 75 y.o.   MRN: [id number]           Chief complaint:  Follow-up of prediabetes  History of Present Illness:  She was told by her PCP that she has type 2 diabetes based on an A1c of 6.5% in 07/2013 She has not had any other A1c is subsequently above normal, normal range and previous lab 6.2 or less She also has not had any abnormal blood sugars except a glucose of 125 in 3/16  She does have increased fasting blood sugars ranging from 108-125 since about 2012  She may have been told about 10 years ago that she had prediabetes and reportedly had a normal glucose tolerance test.  RECENT history:   GLUCOSE TOLERANCE test on 05/23/15: Fasting glucose 110, two-hour glucose 192   With her A1c of 6.2 she was started on metformin ER again in 4/17 She was taking 1500 mg a day with 2 tablets of metformin ER at dinnertime, because of her symptoms of heartburn the dose was reduced to 1 tablet twice a day. However in 9/17 she called about bloating feeling and abdominal distention With stopping metformin and this symptom resolved right away  Her fasting glucose is still over 120 as before although her A1c is slightly higher at 6.2  Has not lost any weight She says she has limited portions and carbohydrates  She does try to follow a heart healthy diet She is trying to do some walking, is limited by pain in her feet   Previous weight history: 139-143  Wt Readings from Last 3 Encounters:  03/06/16 144 lb 3.2 oz (65.4 kg)  12/04/15 145 lb (65.8 kg)  09/03/15 145 lb (65.8 kg)    Lab Results  Component Value Date   HGBA1C 6.2 03/03/2016   HGBA1C 5.7 11/29/2015   HGBA1C 6.2 08/29/2015   Lab Results  Component Value Date   LDLCALC 89 12/24/2015   CREATININE 0.72 03/03/2016     Lab on 03/03/2016  Component Date Value Ref Range Status  . Hgb A1c MFr Bld 03/03/2016 6.2  4.6 - 6.5 % Final  . Sodium 03/03/2016 137  135 - 145 mEq/L Final  .  Potassium 03/03/2016 4.1  3.5 - 5.1 mEq/L Final  . Chloride 03/03/2016 105  96 - 112 mEq/L Final  . CO2 03/03/2016 26  19 - 32 mEq/L Final  . Glucose, Bld 03/03/2016 121* 70 - 99 mg/dL Final  . BUN 03/03/2016 19  6 - 23 mg/dL Final  . Creatinine, Ser 03/03/2016 0.72  0.40 - 1.20 mg/dL Final  . Calcium 03/03/2016 9.7  8.4 - 10.5 mg/dL Final  . GFR 03/03/2016 84.37  >60.00 mL/min Final      Past Medical History:  Diagnosis Date  . Abnormal finding on EKG, new anterolateral T-wave inversions  05/14/2012  . Abnormal nuclear stress test, with infero-lateral ischemia 05/14/2012  . Arthritis    "right thumb; all my fingers" (05/13/2012), cervical spondylosis   . Atypical angina ([place]) 05/14/2012  . CAD (coronary artery disease), 05/13/12, with 80% LAD 05/14/2012  . Cancer ([place])    basal cell on facial in L temporal region    . CMC arthritis, thumb, degenerative   . Complication of anesthesia 12/2009   "had endoscopy; larynx went into spasms; stopped breathing for 15 seconds" (05/13/2012)  . GERD (gastroesophageal reflux disease) 2011  . Hypercholesteremia   . Hypertension   . S/P angioplasty with stent,  LAD 05/13/12 05/14/2012  . Tuberculosis    test +- [place] [facility] - Dr. [name]- CXR- OK    Past Surgical History:  Procedure Laterality Date  . ANTERIOR CERVICAL DECOMP/DISCECTOMY FUSION N/A 12/13/2013   Procedure: ANTERIOR CERVICAL DECOMPRESSION/DISCECTOMY FUSION 3 LEVELS Cervical four/five,five/six,six/seven. Anterior cervical disectomy and fusion with peek and plate ;  Surgeon: [name], MD;  Location: [facility];  Service: Neurosurgery;  Laterality: N/A;  . CARDIAC CATHETERIZATION N/A 07/02/2015   Procedure: Left Heart Cath and Coronary Angiography;  Surgeon: [name], MD;  Location: [facility];  Service: Cardiovascular;  Laterality: N/A;  . CORONARY ANGIOPLASTY WITH STENT PLACEMENT  05/13/2012   DES to LAD; she has total RCA with left to rt coll. normal LV function  done for positive nuc study  . DILATION AND CURETTAGE OF UTERUS  1960's; 1970's; 1980   "probably 3" (05/13/2012)  . FOREARM / WRIST TENDON LESION EXCISION  ?11/2001   "right; did waver to try to get ulnar out of hole that it had cut" (05/13/2012  . INGUINAL HERNIA REPAIR  ~ 1966; 07/21/2002   "right; left" (05/13/2012)  . LEFT HEART CATHETERIZATION WITH CORONARY ANGIOGRAM N/A 05/13/2012   Procedure: LEFT HEART CATHETERIZATION WITH CORONARY ANGIOGRAM;  Surgeon: [name], MD;  Location: [facility];  Service: Cardiovascular;  Laterality: N/A;  . OSTEOTOMY AND ULNAR SHORTENING  07/21/2002   "right" (05/13/2012)  . TONSILLECTOMY AND ADENOIDECTOMY  1950's  . TUBAL LIGATION  1980    Family History  Problem Relation Age of Onset  . Hypertension Mother   . Diabetes Mother   . Hyperlipidemia Father   . Heart disease Father   . Stroke Father   . Heart disease Sister   . Diabetes Sister   . Heart disease Brother   . Diabetes Maternal Aunt     Social History:  reports that she quit smoking about 27 years ago. Her smoking use included Cigarettes. She has a 10.00 pack-year smoking history. She has never used smokeless tobacco. She reports that she drinks about 7.2 oz of alcohol per week . She reports that she does not use drugs.  Allergies:  Allergies  Allergen Reactions  . Statins Other (See Comments)    "intermittent loss of circulation; hands and arms will go to sleep; feet will get cramps; fatigue" (05/13/2012).  This occurred with Lipitor, Zocor, Crestor 5 mg qd, and she thinks pravastatin as well  . Amlodipine Itching  . Gadolinium Derivatives Itching and Nausea Only    Pt had severe nausea and itching on legs, neck and back, per dr [name] pt needs 13 hour prep before contrast in the future      Medication List       Accurate as of 03/06/16 10:39 AM. Always use your most recent med list.          acetaminophen 500 MG tablet Commonly known as:  TYLENOL Take 500 mg by  mouth every 6 (six) hours as needed for mild pain or headache.   aspirin 81 MG tablet Take 81 mg by mouth daily.   atorvastatin 10 MG tablet Commonly known as:  LIPITOR Take 1 tablet (10 mg total) by mouth daily.   calcium-vitamin D 500-200 MG-UNIT tablet Commonly known as:  OSCAL WITH D Take 2 tablets by mouth daily.   carvedilol 6.25 MG tablet Commonly known as:  COREG Take 6.25 mg by mouth 2 (two) times daily with a meal.   clopidogrel 75  MG tablet Commonly known as:  PLAVIX TAKE 1 TABLET BY MOUTH EVERY DAY   diclofenac sodium 1 % Gel Commonly known as:  VOLTAREN Apply 2 g topically daily as needed. For pain   fenofibrate 160 MG tablet Take 1 tablet (160 mg total) by mouth daily.   hydrocortisone valerate cream 0.2 % Commonly known as:  WESTCORT Apply 1 application topically daily as needed (ITCHING).   irbesartan 300 MG tablet Commonly known as:  AVAPRO Take 1 tablet (300 mg total) by mouth daily.   multivitamin with minerals Tabs tablet Take 1 tablet by mouth daily.   nitroGLYCERIN 0.4 MG SL tablet Commonly known as:  NITROSTAT PLACE 1 TABLET UNDER THE TONGUE EVERY 5 MINUTES AS NEEDED FOR CHEST PAIN   Vitamin D3 2000 units Tabs Take 2,000 Units by mouth 2 (two) times daily.   ZETIA 10 MG tablet Generic drug:  ezetimibe Take 1 tablet (10 mg total) by mouth daily.       LABS:  Lab on 03/03/2016  Component Date Value Ref Range Status  . Hgb A1c MFr Bld 03/03/2016 6.2  4.6 - 6.5 % Final  . Sodium 03/03/2016 137  135 - 145 mEq/L Final  . Potassium 03/03/2016 4.1  3.5 - 5.1 mEq/L Final  . Chloride 03/03/2016 105  96 - 112 mEq/L Final  . CO2 03/03/2016 26  19 - 32 mEq/L Final  . Glucose, Bld 03/03/2016 121* 70 - 99 mg/dL Final  . BUN 03/03/2016 19  6 - 23 mg/dL Final  . Creatinine, Ser 03/03/2016 0.72  0.40 - 1.20 mg/dL Final  . Calcium 03/03/2016 9.7  8.4 - 10.5 mg/dL Final  . GFR 03/03/2016 84.37  >60.00 mL/min Final      Review of  Systems  Other active medical issues include:  Hypertension, treated with Avapro and Coreg  History of osteopenia  History of hyperlipidemia with intolerance to statins, currently on Fenofibrate.And Lipitor without side effects  Also on Zetia  From PCP: TSH 4.55 in 8/17  PHYSICAL EXAM:  BP 138/82   Pulse 72   Temp 98.4 F (36.9 C)   Resp 14   Ht 5\' 1"  (1.549 m)   Wt 144 lb 3.2 oz (65.4 kg)   SpO2 98%   BMI 27.25 kg/m     ASSESSMENT:   Prediabetes with impaired fasting glucose and also impaired glucose tolerance  She is not able to take her metformin that she was started on since her sugar was over 120 fasting  A1c is back up to 6.0, had been down to 5.7 with her taking 1500 mg of metformin ER However she appears to had mild increase in fasting glucose and A1c for several years now Currently not able to do much exercise  History of upper normal TSH: Reassured the patient that since she is not symptomatic that this can be followed and the level may be appropriate for her age  PLAN:   She would try to work on her exercise regimen as tolerated and also continue to improve diet  Recheck A1c and fasting glucose in 3 months   TSH on next visit also  Mirra Basilio 03/06/2016, 10:39 AM

## 2016-03-11 ENCOUNTER — Ambulatory Visit (INDEPENDENT_AMBULATORY_CARE_PROVIDER_SITE_OTHER): Payer: Medicare Other | Admitting: Cardiology

## 2016-03-11 ENCOUNTER — Encounter: Payer: Self-pay | Admitting: Cardiology

## 2016-03-11 VITALS — BP 144/78 | HR 72 | Ht 61.0 in | Wt 146.0 lb

## 2016-03-11 DIAGNOSIS — M9903 Segmental and somatic dysfunction of lumbar region: Secondary | ICD-10-CM | POA: Diagnosis not present

## 2016-03-11 DIAGNOSIS — I2510 Atherosclerotic heart disease of native coronary artery without angina pectoris: Secondary | ICD-10-CM

## 2016-03-11 DIAGNOSIS — E785 Hyperlipidemia, unspecified: Secondary | ICD-10-CM | POA: Diagnosis not present

## 2016-03-11 DIAGNOSIS — I10 Essential (primary) hypertension: Secondary | ICD-10-CM | POA: Diagnosis not present

## 2016-03-11 DIAGNOSIS — S338XXA Sprain of other parts of lumbar spine and pelvis, initial encounter: Secondary | ICD-10-CM | POA: Diagnosis not present

## 2016-03-11 DIAGNOSIS — Z959 Presence of cardiac and vascular implant and graft, unspecified: Secondary | ICD-10-CM | POA: Diagnosis not present

## 2016-03-11 DIAGNOSIS — M9905 Segmental and somatic dysfunction of pelvic region: Secondary | ICD-10-CM | POA: Diagnosis not present

## 2016-03-11 DIAGNOSIS — M9902 Segmental and somatic dysfunction of thoracic region: Secondary | ICD-10-CM | POA: Diagnosis not present

## 2016-03-11 DIAGNOSIS — S29012A Strain of muscle and tendon of back wall of thorax, initial encounter: Secondary | ICD-10-CM | POA: Diagnosis not present

## 2016-03-11 DIAGNOSIS — I2583 Coronary atherosclerosis due to lipid rich plaque: Secondary | ICD-10-CM

## 2016-03-11 DIAGNOSIS — Z95820 Peripheral vascular angioplasty status with implants and grafts: Secondary | ICD-10-CM

## 2016-03-11 DIAGNOSIS — S39012A Strain of muscle, fascia and tendon of lower back, initial encounter: Secondary | ICD-10-CM | POA: Diagnosis not present

## 2016-03-11 NOTE — Assessment & Plan Note (Signed)
Controlled.  

## 2016-03-11 NOTE — Assessment & Plan Note (Signed)
Cath Feb 2017 after an abnormal Myoview- patent stent

## 2016-03-11 NOTE — Assessment & Plan Note (Signed)
CTO RCA with L-R collaterals, 80% Dx1

## 2016-03-11 NOTE — Assessment & Plan Note (Signed)
Intolerant to high dose statin Rx Last LDL down to 89 Aug 2017

## 2016-03-11 NOTE — Progress Notes (Signed)
03/11/2016 Brittany Figueroa   07-31-1940  [id number]  Primary Physician Brittany Sheller, MD Primary Cardiologist: Dr Brittany Figueroa  HPI:  75 y/o female, lives alone in her own townhouse, followed by Dr Brittany Figueroa with a history of CAD. She had a cath and subsequent LAD DES Dec 2013. At that time she was noted to have a CTO of her RCA with L-R collaterals. Her LVF was normal. In Jan 20176 she had an abnormal Myoview. Cath done in Feb 2017 showed a patent LAD stent, unchanged CTO RCA wth collaterals and unchanged Dx 1 disease. Medical therapy recommended. She has been followed by Dr Dwyane [name] for pre diabetes and has a history of dyslipidemia with intolerance to high dose statin Rx. She is in the offcie today for a routine check. She denies any chest pain or unusual dyspnea. She has DJD and this limits her exerise ability. She thinks she may need surgery on her Rt wrist at some point in the future.    Current Outpatient Prescriptions  Medication Sig Dispense Refill  . acetaminophen (TYLENOL) 500 MG tablet Take 500 mg by mouth every 6 (six) hours as needed for mild pain or headache.    Marland Kitchen aspirin 81 MG tablet Take 81 mg by mouth daily.    Marland Kitchen atorvastatin (LIPITOR) 10 MG tablet Take 1 tablet (10 mg total) by mouth daily. 90 tablet 3  . calcium-vitamin D (OSCAL WITH D) 500-200 MG-UNIT per tablet Take 2 tablets by mouth daily.    . carvedilol (COREG) 6.25 MG tablet Take 6.25 mg by mouth 2 (two) times daily with a meal.     . Cholecalciferol (VITAMIN D3) 2000 UNITS TABS Take 2,000 Units by mouth 2 (two) times daily.    . clopidogrel (PLAVIX) 75 MG tablet TAKE 1 TABLET BY MOUTH EVERY DAY 90 tablet 1  . diclofenac sodium (VOLTAREN) 1 % GEL Apply 2 g topically daily as needed. For pain    . fenofibrate 160 MG tablet Take 1 tablet (160 mg total) by mouth daily. 90 tablet 3  . hydrocortisone valerate cream (WESTCORT) 0.2 % Apply 1 application topically daily as needed (ITCHING).    Marland Kitchen irbesartan (AVAPRO) 300 MG tablet Take  1 tablet (300 mg total) by mouth daily. 30 tablet 5  . Multiple Vitamin (MULTIVITAMIN WITH MINERALS) TABS Take 1 tablet by mouth daily.    . nitroGLYCERIN (NITROSTAT) 0.4 MG SL tablet PLACE 1 TABLET UNDER THE TONGUE EVERY 5 MINUTES AS NEEDED FOR CHEST PAIN 25 tablet 0  . ZETIA 10 MG tablet Take 1 tablet (10 mg total) by mouth daily. 30 tablet 6   No current facility-administered medications for this visit.     Allergies  Allergen Reactions  . Statins Other (See Comments)    "intermittent loss of circulation; hands and arms will go to sleep; feet will get cramps; fatigue" (05/13/2012).  This occurred with Lipitor, Zocor, Crestor 5 mg qd, and she thinks pravastatin as well  . Amlodipine Itching  . Gadolinium Derivatives Itching and Nausea Only    Pt had severe nausea and itching on legs, neck and back, per dr [name] pt needs 13 hour prep before contrast in the future  . Metformin And Related Other (See Comments)    Abdominal bloating    Social History   Social History  . Marital status: Divorced    Spouse name: N/A  . Number of children: N/A  . Years of education: N/A   Occupational History  . Not on file.  Social History Main Topics  . Smoking status: Former Smoker    Packs/day: 0.50    Years: 20.00    Types: Cigarettes    Quit date: 07/05/1988  . Smokeless tobacco: Never Used  . Alcohol use 7.2 oz/week    12 Glasses of wine per week     Comment: 05/13/2012 "6-8 oz glass of red wine q hs"   . Drug use: No  . Sexual activity: No   Other Topics Concern  . Not on file   Social History Narrative  . No narrative on file     Review of Systems: General: negative for chills, fever, night sweats or weight changes.  Cardiovascular: negative for chest pain, dyspnea on exertion, edema, orthopnea, palpitations, paroxysmal nocturnal dyspnea or shortness of breath Dermatological: negative for rash Respiratory: negative for cough or wheezing Urologic: negative for  hematuria Abdominal: negative for nausea, vomiting, diarrhea, bright red blood per rectum, melena, or hematemesis Neurologic: negative for visual changes, syncope, or dizziness All other systems reviewed and are otherwise negative except as noted above.    Blood pressure (!) 144/78, pulse 72, height 5\' 1"  (1.549 m), weight 146 lb (66.2 kg).  General appearance: alert, cooperative and no distress Neck: no carotid bruit and no JVD Lungs: clear to auscultation bilaterally Heart: regular rate and rhythm Extremities: extremities normal, atraumatic, no cyanosis or edema Skin: Skin color, texture, turgor normal. No rashes or lesions Neurologic: Grossly normal   ASSESSMENT AND PLAN:   S/P angioplasty with stent, LAD 05/13/12 Cath Feb 2017 after an abnormal Myoview- patent stent  CAD- residual total RCA and Dx disease CTO RCA with L-R collaterals, 80% Dx1  Essential hypertension Controlled  Dyslipidemia Intolerant to high dose statin Rx Last LDL down to 89 Aug 2017   PLAN  Same Rx. If she needs surgery on her wrist it sounds like it would be local. She could have this without further cardiac workup. OK to stop her Plavix for this if needed.   Kerin Ransom PA-C 03/11/2016 3:09 PM

## 2016-03-11 NOTE — Patient Instructions (Addendum)
Medication Instructions:  Your physician recommends that you continue on your current medications as directed. Please refer to the Current Medication list given to you today.   Labwork: None ordered  Testing/Procedures: None ordered  Follow-Up: Your physician wants you to follow-up in: [place] DR. Andria Rhein will receive a reminder letter in the mail two months in advance. If you don't receive a letter, please call our office to schedule the follow-up appointment.   Any Other Special Instructions Will Be Listed Below (If Applicable).    If you need a refill on your cardiac medications before your next appointment, please call your pharmacy.

## 2016-03-17 DIAGNOSIS — H3561 Retinal hemorrhage, right eye: Secondary | ICD-10-CM | POA: Diagnosis not present

## 2016-03-17 DIAGNOSIS — H353121 Nonexudative age-related macular degeneration, left eye, early dry stage: Secondary | ICD-10-CM | POA: Diagnosis not present

## 2016-03-17 DIAGNOSIS — H2513 Age-related nuclear cataract, bilateral: Secondary | ICD-10-CM | POA: Diagnosis not present

## 2016-03-17 DIAGNOSIS — H353111 Nonexudative age-related macular degeneration, right eye, early dry stage: Secondary | ICD-10-CM | POA: Diagnosis not present

## 2016-03-20 ENCOUNTER — Ambulatory Visit (INDEPENDENT_AMBULATORY_CARE_PROVIDER_SITE_OTHER): Payer: Medicare Other | Admitting: Pharmacist

## 2016-03-20 ENCOUNTER — Encounter: Payer: Self-pay | Admitting: Pharmacist

## 2016-03-20 VITALS — BP 132/76 | HR 70

## 2016-03-20 DIAGNOSIS — I2510 Atherosclerotic heart disease of native coronary artery without angina pectoris: Secondary | ICD-10-CM | POA: Diagnosis not present

## 2016-03-20 DIAGNOSIS — I10 Essential (primary) hypertension: Secondary | ICD-10-CM

## 2016-03-20 DIAGNOSIS — I2583 Coronary atherosclerosis due to lipid rich plaque: Secondary | ICD-10-CM | POA: Diagnosis not present

## 2016-03-20 MED ORDER — IRBESARTAN 150 MG PO TABS
150.0000 mg | ORAL_TABLET | Freq: Two times a day (BID) | ORAL | 1 refills | Status: DC
Start: 2016-03-20 — End: 2016-10-21

## 2016-03-20 NOTE — Progress Notes (Signed)
Patient ID: Brittany Figueroa                 DOB: 05-Feb-1941                      MRN: [id number]     HPI: Brittany Figueroa is a 75 y.o. female patient of Dr. Gwenlyn Found with [place] below who presents today for hypertension follow up.    She presents today and states that she has some questions about her pressures. In her home monitoring she has been seeing >20 variants between arms. Her left arm is consistently higher than her right though she states with the higher doses of irbesartan the measurements are closer than before.   She reports that she was taking 150mg  BID for about 10 days then changed to 300mg  once daily as she ran out of the 150mg  tablets. She reports that with the increased dose the "pressure in her head" has resolved.   She also reports her pressures ran high right after a visit with the optometrist because she was told she had a retinal hemorrhage. She has been very concerned about this.   She also reports that she plans to start walking now that she has had some improvement in her foot pain. She will build herself to 26minutes per day.    Cardiac Hx: HTN, CAD, HLD  Current HTN meds:  Carvedilol 6.25mg  BID (730am, 730pm) irebesartan 300mg  daily in the morning  Previously tried:  Metoprolol - made her tired and gained weight  BP goal: <140/90 - prediabetes   Cardiac Hx: LAD stent 2013; hyperlipidemia; impaired glucose tolerance  Family Hx: mother and multiple siblings with hyperlipidemia, hypertension; knows little of father's family  Social Hx: no tobacco, drinks 1 glass red wine with dinner  Diet: uses some salt, but only on specific items  Exercise: none currently, burning sensation on feet not as bad as previously  Home BP readings:  Pressures mostly in 130s-150s/70s-80s. Her pressures were better controlled when taking 150mg  bid - she only had one measurement >150/90 during this 10 day period. Per her log there is a significant difference (30mmHg) between arms  though today in office they are closer (4mmHg).    Wt Readings from Last 3 Encounters:  03/11/16 146 lb (66.2 kg)  03/06/16 144 lb 3.2 oz (65.4 kg)  12/04/15 145 lb (65.8 kg)   BP Readings from Last 3 Encounters:  03/20/16 (!) 142/74  03/11/16 (!) 144/78  03/06/16 138/82   Pulse Readings from Last 3 Encounters:  03/20/16 70  03/11/16 72  03/06/16 72    Renal function: Estimated Creatinine Clearance: 54.6 mL/min (by C-G formula based on SCr of 0.72 mg/dL).  Past Medical History:  Diagnosis Date  . Abnormal finding on EKG, new anterolateral T-wave inversions  05/14/2012  . Abnormal nuclear stress test, with infero-lateral ischemia 05/14/2012  . Arthritis    "right thumb; all my fingers" (05/13/2012), cervical spondylosis   . Atypical angina ([place]) 05/14/2012  . CAD (coronary artery disease), 05/13/12, with 80% LAD 05/14/2012  . Cancer ([place])    basal cell on facial in L temporal region    . CMC arthritis, thumb, degenerative   . Complication of anesthesia 12/2009   "had endoscopy; larynx went into spasms; stopped breathing for 15 seconds" (05/13/2012)  . GERD (gastroesophageal reflux disease) 2011  . Hypercholesteremia   . Hypertension   . S/P angioplasty with stent, LAD 05/13/12 05/14/2012  . Tuberculosis  test +- [place] [facility] - Dr. [name]- CXR- OK    Current Outpatient Prescriptions on File Prior to Visit  Medication Sig Dispense Refill  . acetaminophen (TYLENOL) 500 MG tablet Take 500 mg by mouth every 6 (six) hours as needed for mild pain or headache.    Marland Kitchen aspirin 81 MG tablet Take 81 mg by mouth daily.    Marland Kitchen atorvastatin (LIPITOR) 10 MG tablet Take 1 tablet (10 mg total) by mouth daily. 90 tablet 3  . calcium-vitamin D (OSCAL WITH D) 500-200 MG-UNIT per tablet Take 2 tablets by mouth daily.    . carvedilol (COREG) 6.25 MG tablet Take 6.25 mg by mouth 2 (two) times daily with a meal.     . Cholecalciferol (VITAMIN D3) 2000 UNITS TABS Take 2,000 Units by mouth 2 (two)  times daily.    . clopidogrel (PLAVIX) 75 MG tablet TAKE 1 TABLET BY MOUTH EVERY DAY 90 tablet 1  . diclofenac sodium (VOLTAREN) 1 % GEL Apply 2 g topically daily as needed. For pain    . fenofibrate 160 MG tablet Take 1 tablet (160 mg total) by mouth daily. 90 tablet 3  . hydrocortisone valerate cream (WESTCORT) 0.2 % Apply 1 application topically daily as needed (ITCHING).    . Multiple Vitamin (MULTIVITAMIN WITH MINERALS) TABS Take 1 tablet by mouth daily.    . nitroGLYCERIN (NITROSTAT) 0.4 MG SL tablet PLACE 1 TABLET UNDER THE TONGUE EVERY 5 MINUTES AS NEEDED FOR CHEST PAIN 25 tablet 0  . ZETIA 10 MG tablet Take 1 tablet (10 mg total) by mouth daily. 30 tablet 6   No current facility-administered medications on file prior to visit.     Allergies  Allergen Reactions  . Statins Other (See Comments)    "intermittent loss of circulation; hands and arms will go to sleep; feet will get cramps; fatigue" (05/13/2012).  This occurred with Lipitor, Zocor, Crestor 5 mg qd, and she thinks pravastatin as well  . Amlodipine Itching  . Gadolinium Derivatives Itching and Nausea Only    Pt had severe nausea and itching on legs, neck and back, per dr [name] pt needs 13 hour prep before contrast in the future  . Metformin And Related Other (See Comments)    Abdominal bloating    Blood pressure (!) 142/74, pulse 70, SpO2 97 %.   Assessment/Plan: Hypertension: BP is not controlled, but improved from previous. Since her pressures seemed to do better with 150mg  BID will have change back to this dose and continue to monitor. Follow up in hypertension clinic in 4-6 weeks.    Thank you, Lelan Pons. Patterson Hammersmith, [facility]  03/20/2016 10:11 AM

## 2016-03-20 NOTE — Patient Instructions (Addendum)
Return for a a follow up appointment in 4-6 weeks  Check your blood pressure at home daily (if able) and keep record of the readings.  Take your BP meds as follows: START taking irbesartan 150mg  twice daily  Bring all of your meds, your BP cuff and your record of home blood pressures to your next appointment.  Exercise as you're able, try to walk approximately 30 minutes per day.  Keep salt intake to a minimum, especially watch canned and prepared boxed foods.  Eat more fresh fruits and vegetables and fewer canned items.  Avoid eating in fast food restaurants.    HOW TO TAKE YOUR BLOOD PRESSURE: . Rest 5 minutes before taking your blood pressure. .  Don't smoke or drink caffeinated beverages for at least 30 minutes before. . Take your blood pressure before (not after) you eat. . Sit comfortably with your back supported and both feet on the floor (don't cross your legs). . Elevate your arm to heart level on a table or a desk. . Use the proper sized cuff. It should fit smoothly and snugly around your bare upper arm. There should be enough room to slip a fingertip under the cuff. The bottom edge of the cuff should be 1 inch above the crease of the elbow. . Ideally, take 3 measurements at one sitting and record the average.

## 2016-03-31 DIAGNOSIS — S29012A Strain of muscle and tendon of back wall of thorax, initial encounter: Secondary | ICD-10-CM | POA: Diagnosis not present

## 2016-03-31 DIAGNOSIS — S39012A Strain of muscle, fascia and tendon of lower back, initial encounter: Secondary | ICD-10-CM | POA: Diagnosis not present

## 2016-03-31 DIAGNOSIS — S338XXA Sprain of other parts of lumbar spine and pelvis, initial encounter: Secondary | ICD-10-CM | POA: Diagnosis not present

## 2016-03-31 DIAGNOSIS — M9902 Segmental and somatic dysfunction of thoracic region: Secondary | ICD-10-CM | POA: Diagnosis not present

## 2016-03-31 DIAGNOSIS — M9905 Segmental and somatic dysfunction of pelvic region: Secondary | ICD-10-CM | POA: Diagnosis not present

## 2016-03-31 DIAGNOSIS — M9903 Segmental and somatic dysfunction of lumbar region: Secondary | ICD-10-CM | POA: Diagnosis not present

## 2016-04-25 DIAGNOSIS — H353121 Nonexudative age-related macular degeneration, left eye, early dry stage: Secondary | ICD-10-CM | POA: Diagnosis not present

## 2016-04-25 DIAGNOSIS — I708 Atherosclerosis of other arteries: Secondary | ICD-10-CM | POA: Diagnosis not present

## 2016-04-25 DIAGNOSIS — H35033 Hypertensive retinopathy, bilateral: Secondary | ICD-10-CM | POA: Diagnosis not present

## 2016-04-25 DIAGNOSIS — H353112 Nonexudative age-related macular degeneration, right eye, intermediate dry stage: Secondary | ICD-10-CM | POA: Diagnosis not present

## 2016-04-28 DIAGNOSIS — S39012A Strain of muscle, fascia and tendon of lower back, initial encounter: Secondary | ICD-10-CM | POA: Diagnosis not present

## 2016-04-28 DIAGNOSIS — S29012A Strain of muscle and tendon of back wall of thorax, initial encounter: Secondary | ICD-10-CM | POA: Diagnosis not present

## 2016-04-28 DIAGNOSIS — M9902 Segmental and somatic dysfunction of thoracic region: Secondary | ICD-10-CM | POA: Diagnosis not present

## 2016-04-28 DIAGNOSIS — S338XXA Sprain of other parts of lumbar spine and pelvis, initial encounter: Secondary | ICD-10-CM | POA: Diagnosis not present

## 2016-04-28 DIAGNOSIS — M9903 Segmental and somatic dysfunction of lumbar region: Secondary | ICD-10-CM | POA: Diagnosis not present

## 2016-04-28 DIAGNOSIS — M9905 Segmental and somatic dysfunction of pelvic region: Secondary | ICD-10-CM | POA: Diagnosis not present

## 2016-05-01 ENCOUNTER — Ambulatory Visit (INDEPENDENT_AMBULATORY_CARE_PROVIDER_SITE_OTHER): Payer: Medicare Other | Admitting: Pharmacist Clinician (PhC)/ Clinical Pharmacy Specialist

## 2016-05-01 ENCOUNTER — Other Ambulatory Visit: Payer: Self-pay | Admitting: Cardiovascular Disease

## 2016-05-01 VITALS — BP 152/80 | HR 72

## 2016-05-01 DIAGNOSIS — I2510 Atherosclerotic heart disease of native coronary artery without angina pectoris: Secondary | ICD-10-CM

## 2016-05-01 DIAGNOSIS — I10 Essential (primary) hypertension: Secondary | ICD-10-CM

## 2016-05-01 DIAGNOSIS — I2583 Coronary atherosclerosis due to lipid rich plaque: Secondary | ICD-10-CM

## 2016-05-01 MED ORDER — CHLORTHALIDONE 25 MG PO TABS: 12.5000 mg | ORAL_TABLET | Freq: Every day | ORAL | 1 refills | Status: DC

## 2016-05-01 NOTE — Progress Notes (Signed)
Patient ID: Brittany Figueroa                 DOB: [birth year]                      MRN: [id number]     HPI: Brittany Figueroa is a 75 y.o. female patient of Dr. Gwenlyn Found with [place] below who presents today for hypertension follow up.    She presents today and states that she has some questions about her pressures. In her home monitoring she has been seeing >20 variants between arms. She admits that it is sometimes difficult to get the cuff to fit snugly on her right arm, but she continues to see a variation of 5-20 points.    She reports that she was taking irbesartan 150mg  BID for about 10 days then changed to 300mg  once daily as she ran out of the 150mg  tablets. She reports that with the increased dose the "pressure in her head" has resolved.   She has been recently diagnosed with macular degeneration and is taking Occuvite for this.  Her cholesterol is much improved, she has been tolerating atorvastatin 10, ezetimibe 10 and fenofibrate 160 for about 4 months now.  Last LDL was 89 in August.    Cardiac Hx: HTN, CAD, HLD  Current HTN meds:  Carvedilol 6.25 mg twice daily irebesartan 150 mg twice daily  Previously tried:  Metoprolol - made her tired and gained weight  BP goal: <130/80   Cardiac Hx: LAD stent 2013; hyperlipidemia; impaired glucose tolerance  Family Hx: mother and multiple siblings with hyperlipidemia, hypertension; knows little of father's family  Social Hx: no tobacco, drinks 1 glass red wine with dinner  Diet: uses some salt, but only on specific items; lean meats, vegetables; no fried foods, low carbohydrates  Exercise: none currently, feet better but has not yet started walking; plans to join [employer] for [employer]  Home BP readings:  Pressures mostly in 130s-150s/70s-80s by her record.  She takes pressure in both arms twice daily.  Since December 1 she has had 14/42 readings > 150, and only about 6-8 < 130.  The variance in her arms runs from 5-20 points on various days.       Wt Readings from Last 3 Encounters:  03/11/16 146 lb (66.2 kg)  03/06/16 144 lb 3.2 oz (65.4 kg)  12/04/15 145 lb (65.8 kg)   BP Readings from Last 3 Encounters:  05/01/16 (!) 152/80  03/20/16 132/76  03/11/16 (!) 144/78   Pulse Readings from Last 3 Encounters:  05/01/16 72  03/20/16 70  03/11/16 72    Renal function: CrCl cannot be calculated (Patient's most recent lab result is older than the maximum 21 days allowed.).  Past Medical History:  Diagnosis Date  . Abnormal finding on EKG, new anterolateral T-wave inversions  05/14/2012  . Abnormal nuclear stress test, with infero-lateral ischemia 05/14/2012  . Arthritis    "right thumb; all my fingers" (05/13/2012), cervical spondylosis   . Atypical angina ([place]) 05/14/2012  . CAD (coronary artery disease), 05/13/12, with 80% LAD 05/14/2012  . Cancer ([place])    basal cell on facial in L temporal region    . CMC arthritis, thumb, degenerative   . Complication of anesthesia 12/2009   "had endoscopy; larynx went into spasms; stopped breathing for 15 seconds" (05/13/2012)  . GERD (gastroesophageal reflux disease) 2011  . Hypercholesteremia   . Hypertension   . S/P angioplasty with stent, LAD  05/13/12 05/14/2012  . Tuberculosis    test +- [place] [facility] - Dr. [name]- CXR- OK    Current Outpatient Prescriptions on File Prior to Visit  Medication Sig Dispense Refill  . acetaminophen (TYLENOL) 500 MG tablet Take 500 mg by mouth every 6 (six) hours as needed for mild pain or headache.    Marland Kitchen aspirin 81 MG tablet Take 81 mg by mouth daily.    Marland Kitchen atorvastatin (LIPITOR) 10 MG tablet Take 1 tablet (10 mg total) by mouth daily. 90 tablet 3  . calcium-vitamin D (OSCAL WITH D) 500-200 MG-UNIT per tablet Take 2 tablets by mouth daily.    . carvedilol (COREG) 6.25 MG tablet Take 6.25 mg by mouth 2 (two) times daily with a meal.     . Cholecalciferol (VITAMIN D3) 2000 UNITS TABS Take 2,000 Units by mouth 2 (two) times daily.    . clopidogrel  (PLAVIX) 75 MG tablet TAKE 1 TABLET BY MOUTH EVERY DAY 90 tablet 1  . diclofenac sodium (VOLTAREN) 1 % GEL Apply 2 g topically daily as needed. For pain    . fenofibrate 160 MG tablet Take 1 tablet (160 mg total) by mouth daily. 90 tablet 3  . hydrocortisone valerate cream (WESTCORT) 0.2 % Apply 1 application topically daily as needed (ITCHING).    Marland Kitchen irbesartan (AVAPRO) 150 MG tablet Take 1 tablet (150 mg total) by mouth 2 (two) times daily. 180 tablet 1  . Multiple Vitamin (MULTIVITAMIN WITH MINERALS) TABS Take 1 tablet by mouth daily.    . nitroGLYCERIN (NITROSTAT) 0.4 MG SL tablet PLACE 1 TABLET UNDER THE TONGUE EVERY 5 MINUTES AS NEEDED FOR CHEST PAIN 25 tablet 0  . ZETIA 10 MG tablet Take 1 tablet (10 mg total) by mouth daily. 30 tablet 6   No current facility-administered medications on file prior to visit.     Allergies  Allergen Reactions  . Statins Other (See Comments)    "intermittent loss of circulation; hands and arms will go to sleep; feet will get cramps; fatigue" (05/13/2012).  This occurred with Lipitor, Zocor, Crestor 5 mg qd, and she thinks pravastatin as well  . Amlodipine Itching  . Gadolinium Derivatives Itching and Nausea Only    Pt had severe nausea and itching on legs, neck and back, per dr [name] pt needs 13 hour prep before contrast in the future  . Metformin And Related Other (See Comments)    Abdominal bloating    Blood pressure (!) 152/80, pulse 72. Rigt arm 146/76   Assessment/Plan: BP elevated today at 152/84.  With the new guidelines goal at 130/80 and the rise overall since her last visit, will add chlorthalidone 12.5 mg once daily.  She will get a BMET in 7-10 days and return in 1 month for follow up.  She was encouraged to start her walking program and continue to monitor her diet.        Thank you, Lelan Pons. Patterson Hammersmith, [facility]  05/01/2016 2:22 PM

## 2016-05-01 NOTE — Patient Instructions (Addendum)
Return for a a follow up appointment in 1 month  Your blood pressure today is 152/84  Check your blood pressure at home daily and keep record of the readings.  Take your BP meds as follows:  Start chlorthalidone 12.5 mg (1/2 tablet) daily in the mornings  Continue with irbesartan and carvedilol  Bring all of your meds, your BP cuff and your record of home blood pressures to your next appointment.  Exercise as you're able, try to walk approximately 30 minutes per day.  Keep salt intake to a minimum, especially watch canned and prepared boxed foods.  Eat more fresh fruits and vegetables and fewer canned items.  Avoid eating in fast food restaurants.    HOW TO TAKE YOUR BLOOD PRESSURE: . Rest 5 minutes before taking your blood pressure. .  Don't smoke or drink caffeinated beverages for at least 30 minutes before. . Take your blood pressure before (not after) you eat. . Sit comfortably with your back supported and both feet on the floor (don't cross your legs). . Elevate your arm to heart level on a table or a desk. . Use the proper sized cuff. It should fit smoothly and snugly around your bare upper arm. There should be enough room to slip a fingertip under the cuff. The bottom edge of the cuff should be 1 inch above the crease of the elbow. . Ideally, take 3 measurements at one sitting and record the average.

## 2016-05-01 NOTE — Assessment & Plan Note (Signed)
BP elevated today at 152/84.  With the new guidelines goal at 130/80 and the rise overall since her last visit, will add chlorthalidone 12.5 mg once daily.  She will get a BMET in 7-10 days and return in 1 month for follow up.  She was encouraged to start her walking program and continue to monitor her diet.

## 2016-05-09 ENCOUNTER — Other Ambulatory Visit: Payer: Self-pay | Admitting: Cardiovascular Disease

## 2016-05-09 DIAGNOSIS — I10 Essential (primary) hypertension: Secondary | ICD-10-CM | POA: Diagnosis not present

## 2016-05-09 LAB — BASIC METABOLIC PANEL
BUN: 21 mg/dL (ref 7–25)
CO2: 24 mmol/L (ref 20–31)
Calcium: 9.4 mg/dL (ref 8.6–10.4)
Chloride: 101 mmol/L (ref 98–110)
Creat: 0.74 mg/dL (ref 0.60–0.93)
Glucose, Bld: 137 mg/dL — ABNORMAL HIGH (ref 65–99)
Potassium: 4.4 mmol/L (ref 3.5–5.3)
Sodium: 132 mmol/L — ABNORMAL LOW (ref 135–146)

## 2016-05-13 ENCOUNTER — Other Ambulatory Visit: Payer: Self-pay | Admitting: Cardiovascular Disease

## 2016-05-13 NOTE — Telephone Encounter (Signed)
REFILL 

## 2016-05-27 ENCOUNTER — Ambulatory Visit (INDEPENDENT_AMBULATORY_CARE_PROVIDER_SITE_OTHER): Payer: Medicare Other | Admitting: Pharmacist Clinician (PhC)/ Clinical Pharmacy Specialist

## 2016-05-27 DIAGNOSIS — I10 Essential (primary) hypertension: Secondary | ICD-10-CM

## 2016-05-27 NOTE — Patient Instructions (Signed)
  Your blood pressure today is 122/64  (goal is < 130/80)  Check your blood pressure at home daily and keep record of the readings.  Take your BP meds as follows:  Continue with all your current medications  Bring all of your meds, your BP cuff and your record of home blood pressures to your next appointment.  Exercise as you're able, try to walk approximately 30 minutes per day.  Keep salt intake to a minimum, especially watch canned and prepared boxed foods.  Eat more fresh fruits and vegetables and fewer canned items.  Avoid eating in fast food restaurants.    HOW TO TAKE YOUR BLOOD PRESSURE: . Rest 5 minutes before taking your blood pressure. .  Don't smoke or drink caffeinated beverages for at least 30 minutes before. . Take your blood pressure before (not after) you eat. . Sit comfortably with your back supported and both feet on the floor (don't cross your legs). . Elevate your arm to heart level on a table or a desk. . Use the proper sized cuff. It should fit smoothly and snugly around your bare upper arm. There should be enough room to slip a fingertip under the cuff. The bottom edge of the cuff should be 1 inch above the crease of the elbow. . Ideally, take 3 measurements at one sitting and record the average.

## 2016-05-27 NOTE — Assessment & Plan Note (Signed)
BP well controlled in the office today.  Will have her continue with her current regimen.  She is to continue with daily monitoring and will see Dr. Gwenlyn Found in another 2 months.

## 2016-05-27 NOTE — Progress Notes (Signed)
Patient ID: Brittany Figueroa                 DOB: 14-Jun-1940                      MRN: [id number]     HPI: Brittany Figueroa is a 76 y.o. female patient of Dr. Gwenlyn Found with [place] below who presents today for hypertension follow up.   She reports that she was taking irbesartan 150mg  BID for about 10 days then changed to 300mg  once daily as she ran out of the 150mg  tablets.  Her insurance would not pay for the 150 mg tablets twice daily, so she cuts the 300 mg tablet in half daily.  She has no complaints today and feels that her pressure is where it needs to be.  Her cholesterol is much improved, she has been tolerating atorvastatin 10, ezetimibe 10 and fenofibrate 160 for about 4 months now.  Last LDL was 89 in August.    Cardiac Hx: HTN, CAD, HLD  Current HTN meds:  Carvedilol 6.25 mg twice daily irebesartan 150 mg twice daily Chlorthalidone 12.5 mg qd  Previously tried:  Metoprolol - made her tired and gained weight  BP goal: <130/80   Cardiac Hx: LAD stent 2013; hyperlipidemia; impaired glucose tolerance  Family Hx: mother and multiple siblings with hyperlipidemia, hypertension; knows little of father's family  Social Hx: no tobacco, drinks 1 glass red wine with dinner  Diet: uses some salt, but only on specific items; lean meats, vegetables; no fried foods, low carbohydrates  Exercise: none currently  Home BP readings:  Per patient notebook, of the last 34 days, only 7 readings > [id number] diastolic, with only one of those > 140.      Wt Readings from Last 3 Encounters:  05/27/16 143 lb (64.9 kg)  03/11/16 146 lb (66.2 kg)  03/06/16 144 lb 3.2 oz (65.4 kg)   BP Readings from Last 3 Encounters:  05/27/16 122/64  05/01/16 (!) 152/80  03/20/16 132/76   Pulse Readings from Last 3 Encounters:  05/27/16 76  05/01/16 72  03/20/16 70    Renal function: Estimated Creatinine Clearance: 55.1 mL/min (by C-G formula based on SCr of 0.74 mg/dL).  Past Medical History:  Diagnosis  Date  . Abnormal finding on EKG, new anterolateral T-wave inversions  05/14/2012  . Abnormal nuclear stress test, with infero-lateral ischemia 05/14/2012  . Arthritis    "right thumb; all my fingers" (05/13/2012), cervical spondylosis   . Atypical angina ([place]) 05/14/2012  . CAD (coronary artery disease), 05/13/12, with 80% LAD 05/14/2012  . Cancer ([place])    basal cell on facial in L temporal region    . CMC arthritis, thumb, degenerative   . Complication of anesthesia 12/2009   "had endoscopy; larynx went into spasms; stopped breathing for 15 seconds" (05/13/2012)  . GERD (gastroesophageal reflux disease) 2011  . Hypercholesteremia   . Hypertension   . S/P angioplasty with stent, LAD 05/13/12 05/14/2012  . Tuberculosis    test +- [place] [facility] - Dr. [name]- CXR- OK    Current Outpatient Prescriptions on File Prior to Visit  Medication Sig Dispense Refill  . acetaminophen (TYLENOL) 500 MG tablet Take 500 mg by mouth every 6 (six) hours as needed for mild pain or headache.    Marland Kitchen aspirin 81 MG tablet Take 81 mg by mouth daily.    Marland Kitchen atorvastatin (LIPITOR) 10 MG tablet Take 1 tablet (10 mg total) by mouth  daily. 90 tablet 3  . calcium-vitamin D (OSCAL WITH D) 500-200 MG-UNIT per tablet Take 2 tablets by mouth daily.    . carvedilol (COREG) 6.25 MG tablet Take 6.25 mg by mouth 2 (two) times daily with a meal.     . chlorthalidone (HYGROTON) 25 MG tablet Take 0.5 tablets (12.5 mg total) by mouth daily. 15 tablet 1  . Cholecalciferol (VITAMIN D3) 2000 UNITS TABS Take 2,000 Units by mouth 2 (two) times daily.    . clopidogrel (PLAVIX) 75 MG tablet TAKE 1 TABLET BY MOUTH EVERY DAY 90 tablet 1  . diclofenac sodium (VOLTAREN) 1 % GEL Apply 2 g topically daily as needed. For pain    . fenofibrate 160 MG tablet Take 1 tablet (160 mg total) by mouth daily. 90 tablet 3  . hydrocortisone valerate cream (WESTCORT) 0.2 % Apply 1 application topically daily as needed (ITCHING).    Marland Kitchen irbesartan (AVAPRO) 150 MG  tablet Take 1 tablet (150 mg total) by mouth 2 (two) times daily. 180 tablet 1  . Multiple Vitamin (MULTIVITAMIN WITH MINERALS) TABS Take 1 tablet by mouth daily.    . nitroGLYCERIN (NITROSTAT) 0.4 MG SL tablet PLACE 1 TABLET UNDER THE TONGUE EVERY 5 MINUTES AS NEEDED FOR CHEST PAIN 25 tablet 6  . ZETIA 10 MG tablet Take 1 tablet (10 mg total) by mouth daily. 30 tablet 6   No current facility-administered medications on file prior to visit.     Allergies  Allergen Reactions  . Statins Other (See Comments)    "intermittent loss of circulation; hands and arms will go to sleep; feet will get cramps; fatigue" (05/13/2012).  This occurred with Lipitor, Zocor, Crestor 5 mg qd, and she thinks pravastatin as well  . Amlodipine Itching  . Gadolinium Derivatives Itching and Nausea Only    Pt had severe nausea and itching on legs, neck and back, per dr [name] pt needs 13 hour prep before contrast in the future  . Metformin And Related Other (See Comments)    Abdominal bloating    Blood pressure 122/64, pulse 76, height 5' 1.75" (1.568 m), weight 143 lb (64.9 kg).    Assessment/Plan:  BP well controlled in the office today.  Will have her continue with her current regimen.  She is to continue with daily monitoring and will see Dr. Gwenlyn Found in another 2 months.       Thank you, Lelan Pons. Patterson Hammersmith, [facility]  05/27/2016 8:18 PM

## 2016-06-02 DIAGNOSIS — M9905 Segmental and somatic dysfunction of pelvic region: Secondary | ICD-10-CM | POA: Diagnosis not present

## 2016-06-02 DIAGNOSIS — S29012A Strain of muscle and tendon of back wall of thorax, initial encounter: Secondary | ICD-10-CM | POA: Diagnosis not present

## 2016-06-02 DIAGNOSIS — S39012A Strain of muscle, fascia and tendon of lower back, initial encounter: Secondary | ICD-10-CM | POA: Diagnosis not present

## 2016-06-02 DIAGNOSIS — M9902 Segmental and somatic dysfunction of thoracic region: Secondary | ICD-10-CM | POA: Diagnosis not present

## 2016-06-02 DIAGNOSIS — S338XXA Sprain of other parts of lumbar spine and pelvis, initial encounter: Secondary | ICD-10-CM | POA: Diagnosis not present

## 2016-06-02 DIAGNOSIS — M9903 Segmental and somatic dysfunction of lumbar region: Secondary | ICD-10-CM | POA: Diagnosis not present

## 2016-06-03 ENCOUNTER — Other Ambulatory Visit (INDEPENDENT_AMBULATORY_CARE_PROVIDER_SITE_OTHER): Payer: Medicare Other

## 2016-06-03 DIAGNOSIS — R946 Abnormal results of thyroid function studies: Secondary | ICD-10-CM

## 2016-06-03 DIAGNOSIS — R7989 Other specified abnormal findings of blood chemistry: Secondary | ICD-10-CM

## 2016-06-03 DIAGNOSIS — R7301 Impaired fasting glucose: Secondary | ICD-10-CM

## 2016-06-03 DIAGNOSIS — I10 Essential (primary) hypertension: Secondary | ICD-10-CM

## 2016-06-03 LAB — TSH: TSH: 5.59 u[IU]/mL — ABNORMAL HIGH (ref 0.35–4.50)

## 2016-06-03 LAB — BASIC METABOLIC PANEL
BUN: 21 mg/dL (ref 6–23)
CO2: 26 mEq/L (ref 19–32)
Calcium: 9.8 mg/dL (ref 8.4–10.5)
Chloride: 102 mEq/L (ref 96–112)
Creatinine, Ser: 0.78 mg/dL (ref 0.40–1.20)
GFR: 76.87 mL/min (ref >60.00)
Glucose, Bld: 132 mg/dL — ABNORMAL HIGH (ref 70–99)
Potassium: 4.2 mEq/L (ref 3.5–5.1)
Sodium: 133 mEq/L — ABNORMAL LOW (ref 135–145)

## 2016-06-03 LAB — T4, FREE: Free T4: 1.03 ng/dL (ref 0.60–1.60)

## 2016-06-03 LAB — HEMOGLOBIN A1C: Hgb A1c MFr Bld: 6.5 % (ref 4.6–6.5)

## 2016-06-06 ENCOUNTER — Ambulatory Visit (INDEPENDENT_AMBULATORY_CARE_PROVIDER_SITE_OTHER): Payer: Medicare Other | Admitting: Endocrinology

## 2016-06-06 ENCOUNTER — Other Ambulatory Visit: Payer: Self-pay

## 2016-06-06 ENCOUNTER — Encounter: Payer: Self-pay | Admitting: Endocrinology

## 2016-06-06 VITALS — BP 130/70 | HR 75 | Ht 62.0 in | Wt 144.0 lb

## 2016-06-06 DIAGNOSIS — R946 Abnormal results of thyroid function studies: Secondary | ICD-10-CM

## 2016-06-06 DIAGNOSIS — R7303 Prediabetes: Secondary | ICD-10-CM

## 2016-06-06 DIAGNOSIS — R7989 Other specified abnormal findings of blood chemistry: Secondary | ICD-10-CM

## 2016-06-06 NOTE — Progress Notes (Signed)
Patient ID: Brittany Figueroa, female   DOB: March 01, 1941, 76 y.o.   MRN: [id number]           Chief complaint:  Follow-up of prediabetes  History of Present Illness:  She was told by her PCP that she has type 2 diabetes based on an A1c of 6.5% in 07/2013 She has not had any other A1c is subsequently above normal, normal range and previous lab 6.2 or less She also has not had any abnormal blood sugars except a glucose of 125 in 3/16  She does have increased fasting blood sugars ranging from 108-125 since about 2012  She may have been told about 10 years ago that she had prediabetes and reportedly had a normal glucose tolerance test.  GLUCOSE TOLERANCE test on 05/23/15: Fasting glucose 110, two-hour glucose 192  RECENT history:   With her A1c of 6.2 she was started on metformin ER again in 4/17 She was taking 1500 mg a day with 2 tablets of metformin ER at dinnertime, because of her symptoms of heartburn the dose was reduced but subsequently had to be stopped because of symptoms of bloating and distention  Her fasting glucose is now 133 and her A1c is slightly higher at 6.5, previously 6.2  Home readings fasting recently: 111, pc 134 and 154   Has not been able to follow her diet with eating sweets and cookies in the last month or 2 However has lost a little weight She says she has knowledge about diet with low glycemic index foods and low fat meals Has not seen a dietitian in years She is trying to go Y to do some walking, is limited by pain in her feet   Previous weight history: 139-143  Wt Readings from Last 3 Encounters:  06/06/16 144 lb (65.3 kg)  05/27/16 143 lb (64.9 kg)  03/11/16 146 lb (66.2 kg)    Lab Results  Component Value Date   HGBA1C 6.5 06/03/2016   HGBA1C 6.2 03/03/2016   HGBA1C 5.7 11/29/2015   Lab Results  Component Value Date   LDLCALC 89 12/24/2015   CREATININE 0.78 06/03/2016     Lab on 06/03/2016  Component Date Value Ref Range Status  . Hgb A1c  MFr Bld 06/03/2016 6.5  4.6 - 6.5 % Final  . TSH 06/03/2016 5.59* 0.35 - 4.50 uIU/mL Final  . Free T4 06/03/2016 1.03  0.60 - 1.60 ng/dL Final   Comment: Specimens from patients who are undergoing biotin therapy and /or ingesting biotin supplements may contain high levels of biotin.  The higher biotin concentration in these specimens interferes with this Free T4 assay.  Specimens that contain high levels  of biotin may cause false high results for this Free T4 assay.  Please interpret results in light of the total clinical presentation of the patient.    . Sodium 06/03/2016 133* 135 - 145 mEq/L Final  . Potassium 06/03/2016 4.2  3.5 - 5.1 mEq/L Final  . Chloride 06/03/2016 102  96 - 112 mEq/L Final  . CO2 06/03/2016 26  19 - 32 mEq/L Final  . Glucose, Bld 06/03/2016 132* 70 - 99 mg/dL Final  . BUN 06/03/2016 21  6 - 23 mg/dL Final  . Creatinine, Ser 06/03/2016 0.78  0.40 - 1.20 mg/dL Final  . Calcium 06/03/2016 9.8  8.4 - 10.5 mg/dL Final  . GFR 06/03/2016 76.87  >60.00 mL/min Final      Past Medical History:  Diagnosis Date  . Abnormal finding on  EKG, new anterolateral T-wave inversions  05/14/2012  . Abnormal nuclear stress test, with infero-lateral ischemia 05/14/2012  . Arthritis    "right thumb; all my fingers" (05/13/2012), cervical spondylosis   . Atypical angina ([place]) 05/14/2012  . CAD (coronary artery disease), 05/13/12, with 80% LAD 05/14/2012  . Cancer ([place])    basal cell on facial in L temporal region    . CMC arthritis, thumb, degenerative   . Complication of anesthesia 12/2009   "had endoscopy; larynx went into spasms; stopped breathing for 15 seconds" (05/13/2012)  . GERD (gastroesophageal reflux disease) 2011  . Hypercholesteremia   . Hypertension   . S/P angioplasty with stent, LAD 05/13/12 05/14/2012  . Tuberculosis    test +- [place] [facility] - Dr. [name]- CXR- OK    Past Surgical History:  Procedure Laterality Date  . ANTERIOR CERVICAL DECOMP/DISCECTOMY FUSION  N/A 12/13/2013   Procedure: ANTERIOR CERVICAL DECOMPRESSION/DISCECTOMY FUSION 3 LEVELS Cervical four/five,five/six,six/seven. Anterior cervical disectomy and fusion with peek and plate ;  Surgeon: [name], MD;  Location: [facility];  Service: Neurosurgery;  Laterality: N/A;  . CARDIAC CATHETERIZATION N/A 07/02/2015   Procedure: Left Heart Cath and Coronary Angiography;  Surgeon: [name], MD;  Location: [facility];  Service: Cardiovascular;  Laterality: N/A;  . CORONARY ANGIOPLASTY WITH STENT PLACEMENT  05/13/2012   DES to LAD; she has total RCA with left to rt coll. normal LV function done for positive nuc study  . DILATION AND CURETTAGE OF UTERUS  1960's; 1970's; 1980   "probably 3" (05/13/2012)  . FOREARM / WRIST TENDON LESION EXCISION  ?11/2001   "right; did waver to try to get ulnar out of hole that it had cut" (05/13/2012  . INGUINAL HERNIA REPAIR  ~ 1966; 07/21/2002   "right; left" (05/13/2012)  . LEFT HEART CATHETERIZATION WITH CORONARY ANGIOGRAM N/A 05/13/2012   Procedure: LEFT HEART CATHETERIZATION WITH CORONARY ANGIOGRAM;  Surgeon: [name], MD;  Location: [facility] CATH LAB;  Service: Cardiovascular;  Laterality: N/A;  . OSTEOTOMY AND ULNAR SHORTENING  07/21/2002   "right" (05/13/2012)  . TONSILLECTOMY AND ADENOIDECTOMY  1950's  . TUBAL LIGATION  1980    Family History  Problem Relation Age of Onset  . Hypertension Mother   . Diabetes Mother   . Hyperlipidemia Father   . Heart disease Father   . Stroke Father   . Heart disease Sister   . Diabetes Sister   . Heart disease Brother   . Diabetes Maternal Aunt     Social History:  reports that she quit smoking about 27 years ago. Her smoking use included Cigarettes. She has a 10.00 pack-year smoking history. She has never used smokeless tobacco. She reports that she drinks about 7.2 oz of alcohol per week . She reports that she does not use drugs.  Allergies:  Allergies  Allergen Reactions  . Statins  Other (See Comments)    "intermittent loss of circulation; hands and arms will go to sleep; feet will get cramps; fatigue" (05/13/2012).  This occurred with Lipitor, Zocor, Crestor 5 mg qd, and she thinks pravastatin as well  . Amlodipine Itching  . Gadolinium Derivatives Itching and Nausea Only    Pt had severe nausea and itching on legs, neck and back, per dr [name] pt needs 13 hour prep before contrast in the future  . Metformin And Related Other (See Comments)    Abdominal bloating    Allergies as of 06/06/2016  Reactions   Statins Other (See Comments)   "intermittent loss of circulation; hands and arms will go to sleep; feet will get cramps; fatigue" (05/13/2012).  This occurred with Lipitor, Zocor, Crestor 5 mg qd, and she thinks pravastatin as well   Amlodipine Itching   Gadolinium Derivatives Itching, Nausea Only   Pt had severe nausea and itching on legs, neck and back, per dr [name] pt needs 13 hour prep before contrast in the future   Metformin And Related Other (See Comments)   Abdominal bloating      Medication List       Accurate as of 06/06/16 10:25 AM. Always use your most recent med list.          acetaminophen 500 MG tablet Commonly known as:  TYLENOL Take 500 mg by mouth every 6 (six) hours as needed for mild pain or headache.   aspirin 81 MG tablet Take 81 mg by mouth daily.   atorvastatin 10 MG tablet Commonly known as:  LIPITOR Take 1 tablet (10 mg total) by mouth daily.   calcium-vitamin D 500-200 MG-UNIT tablet Commonly known as:  OSCAL WITH D Take 2 tablets by mouth daily.   carvedilol 6.25 MG tablet Commonly known as:  COREG Take 6.25 mg by mouth 2 (two) times daily with a meal.   chlorthalidone 25 MG tablet Commonly known as:  HYGROTON Take 0.5 tablets (12.5 mg total) by mouth daily.   clopidogrel 75 MG tablet Commonly known as:  PLAVIX TAKE 1 TABLET BY MOUTH EVERY DAY   diclofenac sodium 1 % Gel Commonly known as:   VOLTAREN Apply 2 g topically daily as needed. For pain   fenofibrate 160 MG tablet Take 1 tablet (160 mg total) by mouth daily.   hydrocortisone valerate cream 0.2 % Commonly known as:  WESTCORT Apply 1 application topically daily as needed (ITCHING).   irbesartan 150 MG tablet Commonly known as:  AVAPRO Take 1 tablet (150 mg total) by mouth 2 (two) times daily.   multivitamin with minerals Tabs tablet Take 1 tablet by mouth daily.   nitroGLYCERIN 0.4 MG SL tablet Commonly known as:  NITROSTAT PLACE 1 TABLET UNDER THE TONGUE EVERY 5 MINUTES AS NEEDED FOR CHEST PAIN   Vitamin D3 2000 units Tabs Take 2,000 Units by mouth 2 (two) times daily.   ZETIA 10 MG tablet Generic drug:  ezetimibe Take 1 tablet (10 mg total) by mouth daily.       LABS:  Lab on 06/03/2016  Component Date Value Ref Range Status  . Hgb A1c MFr Bld 06/03/2016 6.5  4.6 - 6.5 % Final  . TSH 06/03/2016 5.59* 0.35 - 4.50 uIU/mL Final  . Free T4 06/03/2016 1.03  0.60 - 1.60 ng/dL Final   Comment: Specimens from patients who are undergoing biotin therapy and /or ingesting biotin supplements may contain high levels of biotin.  The higher biotin concentration in these specimens interferes with this Free T4 assay.  Specimens that contain high levels  of biotin may cause false high results for this Free T4 assay.  Please interpret results in light of the total clinical presentation of the patient.    . Sodium 06/03/2016 133* 135 - 145 mEq/L Final  . Potassium 06/03/2016 4.2  3.5 - 5.1 mEq/L Final  . Chloride 06/03/2016 102  96 - 112 mEq/L Final  . CO2 06/03/2016 26  19 - 32 mEq/L Final  . Glucose, Bld 06/03/2016 132* 70 - 99 mg/dL Final  . BUN  06/03/2016 21  6 - 23 mg/dL Final  . Creatinine, Ser 06/03/2016 0.78  0.40 - 1.20 mg/dL Final  . Calcium 06/03/2016 9.8  8.4 - 10.5 mg/dL Final  . GFR 06/03/2016 76.87  >60.00 mL/min Final      Review of Systems  Endocrine: Negative for fatigue.    Other active  medical issues include:  Hypertension, treated with Avapro and Coreg  History of osteopenia  History of hyperlipidemia with intolerance to statins, currently on Fenofibrate, 10 mg Lipitor without side effects  Also on Zetia.  This is followed by cardiologist in the lipid clinic  ?  HYPOTHYROIDISM: TSH 4.55 in 8/17 This is now relatively high at 5.6 but she does not think she is unusually  fatigued   PHYSICAL EXAM:  BP 130/70   Pulse 75   Ht 5\' 2"  (1.575 m)   Wt 144 lb (65.3 kg)   SpO2 95%   BMI 26.34 kg/m     ASSESSMENT:   Prediabetes with impaired fasting glucose and also impaired glucose tolerance, now with a glucose level over 125 Although A1c has ranged between 5.7 and 6.2 previously it is now 6.5 She thinks her sugars maybe higher because of poor diet in the last month  Currently she is reluctant to take medications and wants to do better with her diet She is starting to exercise a little on the treadmill or other exercises at the [facility] Previously has had intolerance to metformin ER   Mild increase in TSH: She appears asymptomatic  HYPERLIPIDEMIA: Followed and cardiologist office, not clear if she needs fenofibrate with her triglycerides below 100   PLAN:   She agrees to try metformin ER 500 mg with her main meal again She is trying to exercise and continue to improve diet and watch sweets better She wants to check her sugar more before she goes to see the dietitian  She wants to have reevaluation done in 6 weeks She will try to continue increasing exercise  TSH abnormality: We'll continue to monitor, maybe transient  Avni Traore 06/06/2016, 10:25 AM

## 2016-06-06 NOTE — Patient Instructions (Addendum)
Try 1 metformin after dinner

## 2016-06-19 ENCOUNTER — Other Ambulatory Visit: Payer: Self-pay | Admitting: Cardiovascular Disease

## 2016-06-24 ENCOUNTER — Other Ambulatory Visit: Payer: Self-pay | Admitting: Endocrinology

## 2016-06-24 ENCOUNTER — Other Ambulatory Visit: Payer: Self-pay | Admitting: Cardiovascular Disease

## 2016-06-27 DIAGNOSIS — I129 Hypertensive chronic kidney disease with stage 1 through stage 4 chronic kidney disease, or unspecified chronic kidney disease: Secondary | ICD-10-CM | POA: Diagnosis not present

## 2016-06-27 DIAGNOSIS — Z8639 Personal history of other endocrine, nutritional and metabolic disease: Secondary | ICD-10-CM | POA: Diagnosis not present

## 2016-06-27 DIAGNOSIS — M859 Disorder of bone density and structure, unspecified: Secondary | ICD-10-CM | POA: Diagnosis not present

## 2016-06-27 DIAGNOSIS — Z0000 Encounter for general adult medical examination without abnormal findings: Secondary | ICD-10-CM | POA: Diagnosis not present

## 2016-06-27 DIAGNOSIS — M8580 Other specified disorders of bone density and structure, unspecified site: Secondary | ICD-10-CM | POA: Diagnosis not present

## 2016-06-27 DIAGNOSIS — I10 Essential (primary) hypertension: Secondary | ICD-10-CM | POA: Diagnosis not present

## 2016-06-30 DIAGNOSIS — M9905 Segmental and somatic dysfunction of pelvic region: Secondary | ICD-10-CM | POA: Diagnosis not present

## 2016-06-30 DIAGNOSIS — S338XXA Sprain of other parts of lumbar spine and pelvis, initial encounter: Secondary | ICD-10-CM | POA: Diagnosis not present

## 2016-06-30 DIAGNOSIS — M9902 Segmental and somatic dysfunction of thoracic region: Secondary | ICD-10-CM | POA: Diagnosis not present

## 2016-06-30 DIAGNOSIS — S29012A Strain of muscle and tendon of back wall of thorax, initial encounter: Secondary | ICD-10-CM | POA: Diagnosis not present

## 2016-06-30 DIAGNOSIS — S39012A Strain of muscle, fascia and tendon of lower back, initial encounter: Secondary | ICD-10-CM | POA: Diagnosis not present

## 2016-06-30 DIAGNOSIS — M9903 Segmental and somatic dysfunction of lumbar region: Secondary | ICD-10-CM | POA: Diagnosis not present

## 2016-07-03 ENCOUNTER — Other Ambulatory Visit: Payer: Self-pay | Admitting: Cardiovascular Disease

## 2016-07-03 NOTE — Telephone Encounter (Signed)
Rx(s) sent to pharmacy electronically.  

## 2016-07-07 DIAGNOSIS — R739 Hyperglycemia, unspecified: Secondary | ICD-10-CM | POA: Diagnosis not present

## 2016-07-07 DIAGNOSIS — I129 Hypertensive chronic kidney disease with stage 1 through stage 4 chronic kidney disease, or unspecified chronic kidney disease: Secondary | ICD-10-CM | POA: Diagnosis not present

## 2016-07-07 DIAGNOSIS — N182 Chronic kidney disease, stage 2 (mild): Secondary | ICD-10-CM | POA: Diagnosis not present

## 2016-07-07 DIAGNOSIS — I2510 Atherosclerotic heart disease of native coronary artery without angina pectoris: Secondary | ICD-10-CM | POA: Diagnosis not present

## 2016-07-10 NOTE — Progress Notes (Signed)
Pt on atorvastatin 10 mg daily; Pt states her cholesterol is better than it ever has been and she has lost 5 lbs; she would not like to change her medications.  Please advise.

## 2016-07-16 ENCOUNTER — Other Ambulatory Visit: Payer: Self-pay | Admitting: Dermatology

## 2016-07-16 DIAGNOSIS — Z189 Retained foreign body fragments, unspecified material: Secondary | ICD-10-CM | POA: Diagnosis not present

## 2016-07-16 DIAGNOSIS — D235 Other benign neoplasm of skin of trunk: Secondary | ICD-10-CM | POA: Diagnosis not present

## 2016-07-16 DIAGNOSIS — L570 Actinic keratosis: Secondary | ICD-10-CM | POA: Diagnosis not present

## 2016-07-16 DIAGNOSIS — D1801 Hemangioma of skin and subcutaneous tissue: Secondary | ICD-10-CM | POA: Diagnosis not present

## 2016-07-16 DIAGNOSIS — L923 Foreign body granuloma of the skin and subcutaneous tissue: Secondary | ICD-10-CM | POA: Diagnosis not present

## 2016-07-16 DIAGNOSIS — L814 Other melanin hyperpigmentation: Secondary | ICD-10-CM | POA: Diagnosis not present

## 2016-07-16 DIAGNOSIS — L821 Other seborrheic keratosis: Secondary | ICD-10-CM | POA: Diagnosis not present

## 2016-07-16 DIAGNOSIS — H3561 Retinal hemorrhage, right eye: Secondary | ICD-10-CM | POA: Diagnosis not present

## 2016-07-16 DIAGNOSIS — D485 Neoplasm of uncertain behavior of skin: Secondary | ICD-10-CM | POA: Diagnosis not present

## 2016-07-18 ENCOUNTER — Other Ambulatory Visit (INDEPENDENT_AMBULATORY_CARE_PROVIDER_SITE_OTHER): Payer: Medicare Other

## 2016-07-18 ENCOUNTER — Ambulatory Visit: Payer: Medicare Other | Admitting: Endocrinology

## 2016-07-18 DIAGNOSIS — R946 Abnormal results of thyroid function studies: Secondary | ICD-10-CM | POA: Diagnosis not present

## 2016-07-18 DIAGNOSIS — R7303 Prediabetes: Secondary | ICD-10-CM

## 2016-07-18 DIAGNOSIS — R7989 Other specified abnormal findings of blood chemistry: Secondary | ICD-10-CM

## 2016-07-18 LAB — GLUCOSE, RANDOM: Glucose, Bld: 103 mg/dL — ABNORMAL HIGH (ref 70–99)

## 2016-07-18 LAB — TSH: TSH: 3.84 u[IU]/mL (ref 0.35–4.50)

## 2016-07-21 ENCOUNTER — Encounter: Payer: Self-pay | Admitting: Endocrinology

## 2016-07-21 ENCOUNTER — Ambulatory Visit (INDEPENDENT_AMBULATORY_CARE_PROVIDER_SITE_OTHER): Payer: Medicare Other | Admitting: Endocrinology

## 2016-07-21 VITALS — BP 128/70 | HR 70 | Ht 61.0 in | Wt 141.0 lb

## 2016-07-21 DIAGNOSIS — R7303 Prediabetes: Secondary | ICD-10-CM | POA: Diagnosis not present

## 2016-07-21 NOTE — Patient Instructions (Signed)
Check blood sugars on waking up  2-3x weekly  Also check blood sugars about 2 hours after a meal and do this after different meals by rotation  Recommended blood sugar levels on waking up is 90-130 and about 2 hours after meal is 130-160  Please bring your blood sugar monitor to each visit, thank you  

## 2016-07-21 NOTE — Progress Notes (Signed)
Patient ID: Brittany Figueroa, female   DOB: 1940/07/07, 76 y.o.   MRN: [id number]           Chief complaint:  Follow-up of prediabetes  History of Present Illness:  She was told by her PCP that she has type 2 diabetes based on an A1c of 6.5% in 07/2013 She has not had any other A1c is subsequently above normal, normal range and previous lab 6.2 or less She also has not had any abnormal blood sugars except a glucose of 125 in 3/16  She does have increased fasting blood sugars ranging from 108-125 since about 2012  She may have been told about 10 years ago that she had prediabetes and reportedly had a normal glucose tolerance test.  GLUCOSE TOLERANCE test on 05/23/15: Fasting glucose 110, two-hour glucose 192  RECENT history:   With her A1c of 6.2 she was restarted on metformin ER in 4/17 She was taking 1500 mg a day but could not tolerate this because of feeling of bloating  More recently because of her A1c being 6.5 and glucose 133 fasting she was told to start back on metformin and also improve her diet She has been trying to do much better with her diet improving her level of control of carbohydrates and portions She had refused to see the dietitian. Also is trying to exercise more regularly, is limited by pain in her feet   Home readings fasting recently: 92-107, mostly <100  Previous weight history: 139-143  Wt Readings from Last 3 Encounters:  07/21/16 141 lb (64 kg)  06/06/16 144 lb (65.3 kg)  05/27/16 143 lb (64.9 kg)    Lab Results  Component Value Date   HGBA1C 6.5 06/03/2016   HGBA1C 6.2 03/03/2016   HGBA1C 5.7 11/29/2015   Lab Results  Component Value Date   LDLCALC 89 12/24/2015   CREATININE 0.78 06/03/2016     Lab on 07/18/2016  Component Date Value Ref Range Status  . TSH 07/18/2016 3.84  0.35 - 4.50 uIU/mL Final  . Glucose, Bld 07/18/2016 103* 70 - 99 mg/dL Final      Past Medical History:  Diagnosis Date  . Abnormal finding on EKG, new  anterolateral T-wave inversions  05/14/2012  . Abnormal nuclear stress test, with infero-lateral ischemia 05/14/2012  . Arthritis    "right thumb; all my fingers" (05/13/2012), cervical spondylosis   . Atypical angina ([place]) 05/14/2012  . CAD (coronary artery disease), 05/13/12, with 80% LAD 05/14/2012  . Cancer ([place])    basal cell on facial in L temporal region    . CMC arthritis, thumb, degenerative   . Complication of anesthesia 12/2009   "had endoscopy; larynx went into spasms; stopped breathing for 15 seconds" (05/13/2012)  . GERD (gastroesophageal reflux disease) 2011  . Hypercholesteremia   . Hypertension   . S/P angioplasty with stent, LAD 05/13/12 05/14/2012  . Tuberculosis    test +- [place] [facility] - Dr. [name]- CXR- OK    Past Surgical History:  Procedure Laterality Date  . ANTERIOR CERVICAL DECOMP/DISCECTOMY FUSION N/A 12/13/2013   Procedure: ANTERIOR CERVICAL DECOMPRESSION/DISCECTOMY FUSION 3 LEVELS Cervical four/five,five/six,six/seven. Anterior cervical disectomy and fusion with peek and plate ;  Surgeon: [name], MD;  Location: [place] [facility];  Service: Neurosurgery;  Laterality: N/A;  . CARDIAC CATHETERIZATION N/A 07/02/2015   Procedure: Left Heart Cath and Coronary Angiography;  Surgeon: [name], MD;  Location: [place];  Service: Cardiovascular;  Laterality: N/A;  .  CORONARY ANGIOPLASTY WITH STENT PLACEMENT  05/13/2012   DES to LAD; she has total RCA with left to rt coll. normal LV function done for positive nuc study  . DILATION AND CURETTAGE OF UTERUS  1960's; 1970's; 1980   "probably 3" (05/13/2012)  . FOREARM / WRIST TENDON LESION EXCISION  ?11/2001   "right; did waver to try to get ulnar out of hole that it had cut" (05/13/2012  . INGUINAL HERNIA REPAIR  ~ 1966; 07/21/2002   "right; left" (05/13/2012)  . LEFT HEART CATHETERIZATION WITH CORONARY ANGIOGRAM N/A 05/13/2012   Procedure: LEFT HEART CATHETERIZATION WITH CORONARY ANGIOGRAM;  Surgeon:  [name], MD;  Location: [facility] CATH LAB;  Service: Cardiovascular;  Laterality: N/A;  . OSTEOTOMY AND ULNAR SHORTENING  07/21/2002   "right" (05/13/2012)  . TONSILLECTOMY AND ADENOIDECTOMY  1950's  . TUBAL LIGATION  1980    Family History  Problem Relation Age of Onset  . Hypertension Mother   . Diabetes Mother   . Hyperlipidemia Father   . Heart disease Father   . Stroke Father   . Heart disease Sister   . Diabetes Sister   . Heart disease Brother   . Diabetes Maternal Aunt     Social History:  reports that she quit smoking about 28 years ago. Her smoking use included Cigarettes. She has a 10.00 pack-year smoking history. She has never used smokeless tobacco. She reports that she drinks about 7.2 oz of alcohol per week . She reports that she does not use drugs.  Allergies:  Allergies  Allergen Reactions  . Statins Other (See Comments)    "intermittent loss of circulation; hands and arms will go to sleep; feet will get cramps; fatigue" (05/13/2012).  This occurred with Lipitor, Zocor, Crestor 5 mg qd, and she thinks pravastatin as well  . Amlodipine Itching  . Gadolinium Derivatives Itching and Nausea Only    Pt had severe nausea and itching on legs, neck and back, per dr [name] pt needs 13 hour prep before contrast in the future  . Metformin And Related Other (See Comments)    Abdominal bloating    Allergies as of 07/21/2016      Reactions   Statins Other (See Comments)   "intermittent loss of circulation; hands and arms will go to sleep; feet will get cramps; fatigue" (05/13/2012).  This occurred with Lipitor, Zocor, Crestor 5 mg qd, and she thinks pravastatin as well   Amlodipine Itching   Gadolinium Derivatives Itching, Nausea Only   Pt had severe nausea and itching on legs, neck and back, per dr [name] pt needs 13 hour prep before contrast in the future   Metformin And Related Other (See Comments)   Abdominal bloating      Medication List       Accurate as of  07/21/16  9:57 AM. Always use your most recent med list.          acetaminophen 500 MG tablet Commonly known as:  TYLENOL Take 500 mg by mouth every 6 (six) hours as needed for mild pain or headache.   aspirin 81 MG tablet Take 81 mg by mouth daily.   atorvastatin 10 MG tablet Commonly known as:  LIPITOR Take 1 tablet (10 mg total) by mouth daily.   calcium-vitamin D 500-200 MG-UNIT tablet Commonly known as:  OSCAL WITH D Take 2 tablets by mouth daily.   carvedilol 6.25 MG tablet Commonly known as:  COREG Take 6.25 mg by mouth 2 (two) times  daily with a meal.   chlorthalidone 25 MG tablet Commonly known as:  HYGROTON TAKE 1/2 TABLET(12.5 MG) BY MOUTH DAILY   clopidogrel 75 MG tablet Commonly known as:  PLAVIX TAKE 1 TABLET BY MOUTH EVERY DAY   diclofenac sodium 1 % Gel Commonly known as:  VOLTAREN Apply 2 g topically daily as needed. For pain   ezetimibe 10 MG tablet Commonly known as:  ZETIA TAKE 1 TABLET BY MOUTH EVERY DAY   fenofibrate 160 MG tablet Take 1 tablet (160 mg total) by mouth daily.   hydrocortisone valerate cream 0.2 % Commonly known as:  WESTCORT Apply 1 application topically daily as needed (ITCHING).   irbesartan 150 MG tablet Commonly known as:  AVAPRO Take 1 tablet (150 mg total) by mouth 2 (two) times daily.   metFORMIN 500 MG 24 hr tablet Commonly known as:  GLUCOPHAGE-XR TAKE 1 TABLET BY MOUTH DAILY WITH FOOD FOR 1 WEEK, THEN INCREASE TO 2 TABLETS DAILY WITH FOOD   multivitamin with minerals Tabs tablet Take 1 tablet by mouth daily.   nitroGLYCERIN 0.4 MG SL tablet Commonly known as:  NITROSTAT PLACE 1 TABLET UNDER THE TONGUE EVERY 5 MINUTES AS NEEDED FOR CHEST PAIN   Vitamin D3 2000 units Tabs Take 2,000 Units by mouth 2 (two) times daily.       LABS:  Lab on 07/18/2016  Component Date Value Ref Range Status  . TSH 07/18/2016 3.84  0.35 - 4.50 uIU/mL Final  . Glucose, Bld 07/18/2016 103* 70 - 99 mg/dL Final      Review  of Systems    Other active medical issues include:  Hypertension, treated with Avapro and Coreg  History of hyperlipidemia with intolerance to statins, currently on Fenofibrate, 10 mg Lipitor without side effects  Also on Zetia.  This is followed by cardiologist in the lipid clinic  Last LDL 96, previously over 100  ?  HYPOTHYROIDISM: TSH 4.55 in 8/17 and subsequently 5.6 This is now back to normal without any treatment   PHYSICAL EXAM:  BP 128/70   Pulse 70   Ht 5\' 1"  (1.549 m)   Wt 141 lb (64 kg)   SpO2 98%   BMI 26.64 kg/m     ASSESSMENT:   Prediabetes with impaired fasting glucose and also impaired glucose tolerance Although her lab glucose was over 125 at home readings now are significantly better and mostly below 100 She is not due for an A1c which was 6.2 from her PCP last month She is now tolerating 500 mg twice a day of her metformin ER   Mild increase in TSH: This is back to normal now  HYPERLIPIDEMIA: Followed at cardiologist office Reassured her that Lipitor is safe for her and would be beneficial long-term because of her cardiac history, unlikely that this is causing her diabetes   PLAN:   Encouraged her to continue her program of diet and exercise She can cut back on fasting blood sugar testing She can do some readings after meals and bring her monitor for download on each visit    Needham Biggins 07/21/2016, 9:57 AM

## 2016-07-22 ENCOUNTER — Other Ambulatory Visit: Payer: Self-pay

## 2016-07-22 ENCOUNTER — Telehealth: Payer: Self-pay | Admitting: Endocrinology

## 2016-07-22 MED ORDER — GLUCOSE BLOOD VI STRP: ORAL_STRIP | 4 refills | Status: DC

## 2016-07-22 NOTE — Telephone Encounter (Signed)
The patient stats that she was supposed to have One Touch Verio test strips sent to the pharmacy and neither location has seen a Rx sent for these test strips. She would like to have them sent to:  [facility], [place] AT [address] [place] [phone] (Phone) [phone] (Fax)

## 2016-07-22 NOTE — Telephone Encounter (Signed)
Ordered

## 2016-07-24 ENCOUNTER — Other Ambulatory Visit: Payer: Self-pay

## 2016-07-24 NOTE — Telephone Encounter (Signed)
Dx code is on the prescription already

## 2016-07-24 NOTE — Telephone Encounter (Signed)
Need dx code for glucose blood (ONETOUCH VERIO) test strip  [facility] [place], [place] AT [address] [phone] (Phone) [phone] (Fax)

## 2016-07-25 ENCOUNTER — Ambulatory Visit (INDEPENDENT_AMBULATORY_CARE_PROVIDER_SITE_OTHER): Payer: Medicare Other | Admitting: Cardiovascular Disease

## 2016-07-25 ENCOUNTER — Encounter: Payer: Self-pay | Admitting: Cardiovascular Disease

## 2016-07-25 VITALS — BP 152/72 | HR 66 | Ht 61.5 in | Wt 138.0 lb

## 2016-07-25 DIAGNOSIS — E785 Hyperlipidemia, unspecified: Secondary | ICD-10-CM | POA: Diagnosis not present

## 2016-07-25 DIAGNOSIS — I10 Essential (primary) hypertension: Secondary | ICD-10-CM

## 2016-07-25 DIAGNOSIS — I2510 Atherosclerotic heart disease of native coronary artery without angina pectoris: Secondary | ICD-10-CM

## 2016-07-25 NOTE — Assessment & Plan Note (Signed)
History of coronary artery disease status post cardiac catheterization which I performed 07/02/15 via the right radial approach revealing a patent proximal LAD stent, 80% diagonal branch which is unchanged and occluded dominant RCA with left-to-right collaterals. She had normal LV function. She denies chest pain or shortness of breath.

## 2016-07-25 NOTE — Assessment & Plan Note (Signed)
History of hypertension with blood pressure measured today at 152/72. She is on chlorthalidone, carvedilol and Avapro. Continue current meds at current dosing

## 2016-07-25 NOTE — Patient Instructions (Signed)

## 2016-07-25 NOTE — Assessment & Plan Note (Signed)
History of dyslipidemia intolerant to statin drugs although she is on low-dose Lipitor and Zetia. Recent blood work performed by her PCP 06/27/16 revealed total cholesterol of 160, LDL 96 and HDL of 43.

## 2016-07-25 NOTE — Progress Notes (Signed)
07/25/2016 Brittany Figueroa   November 05, 1940  [id number]  Primary Physician Thressa Sheller, MD Primary Cardiologist: [name] MD Brittany Figueroa  HPI:   The patient is a 76 year old mildly overweight divorced Caucasian female, mother to 2, grandmother to 3 grandchildren, who I last saw 07/25/15. She has a history of hypertension and hyperlipidemia. I saw her in December of 2013 with new anterolateral T-wave inversion and a Myoview that showed inferolateral ischemia that was new compared to a prior study. She did have some unusual symptoms at that time. Based on this, I catheterized her on May 13, 2012, revealing an 80% proximal LAD lesion, which I stented using a drug-eluting stent, as well as a total dominant RCA with left to right collaterals and normal LV function. Since stenting her LAD, she is ultimately asymptomatic. Her other problems include hypertension and hyperlipidemia. I have reviewed her blood pressures, which were recorded during cardiac rehab, which were all normal. Her recent blood work revealed a total cholesterol of 161, LDL of 89 and HDL of 40.she denies chest pain or shortness of breath. She had a recent Myoview stress test performed several months ago that showed ischemia in the RCA territory but otherwise unremarkable is explainable by her known total dominant RCA with left to right collaterals. Chief complaint of excruciating neck pain thought to be related to cervical disc disease. She was evaluated by Dr. Because of her neck pain demonstrated cervical disc disease requiring fusion. Because of this she will need to undergo pharmacologic Myoview stress testing to risk stratify her. She underwent cervical discectomy by Dr. Deri Fuelling with excellent clinical result. She no longer is in pain. Her major issue now is treatment of her hyperlipidemia. She is statin intolerant and complains of short-term memory loss. Ultimately, I think she will require P CSK9 monoclonal  injectable therapy although apparently her insurance will not cover enough to make it financially feasible. She saw Rosaria Ferries in the office approximately 2 weeks ago with increasing dyspnea on exertion. A Myoview stress test performed 06/15/15 showed significant ischemia in the RCA territory. This is new compared to her previous Myoview performed in 2015 and suggest the possibility of in-stent restenosis and/or a denovo lesion in the LAD territory. I performed coronary angiography on her 07/02/15 via the right radial approach revealing unchanged anatomy compared to her prior cath 4 years ago. Her proximal LAD stent was patent. Her 80% diagonal branch stenosis was unchanged and her RCA was dominant, occluded proximally with left-to-right collaterals with normal LV function. Since I saw her a year ago she's remained clinically stable. She works out 4-5 days a week for 35 minutes at a time. She is completely asymptomatic.    Current Outpatient Prescriptions  Medication Sig Dispense Refill  . acetaminophen (TYLENOL) 500 MG tablet Take 500 mg by mouth every 6 (six) hours as needed for mild pain or headache.    Marland Kitchen aspirin 81 MG tablet Take 81 mg by mouth daily.    Marland Kitchen atorvastatin (LIPITOR) 10 MG tablet Take 1 tablet (10 mg total) by mouth daily. 90 tablet 3  . calcium-vitamin D (OSCAL WITH D) 500-200 MG-UNIT per tablet Take 2 tablets by mouth daily.    . carvedilol (COREG) 6.25 MG tablet Take 6.25 mg by mouth 2 (two) times daily with a meal.     . chlorthalidone (HYGROTON) 25 MG tablet TAKE 1/2 TABLET(12.5 MG) BY MOUTH DAILY 15 tablet 5  . Cholecalciferol (VITAMIN D3) 2000 UNITS  TABS Take 2,000 Units by mouth 2 (two) times daily.    . clopidogrel (PLAVIX) 75 MG tablet TAKE 1 TABLET BY MOUTH EVERY DAY 90 tablet 0  . diclofenac sodium (VOLTAREN) 1 % GEL Apply 2 g topically daily as needed. For pain    . ezetimibe (ZETIA) 10 MG tablet TAKE 1 TABLET BY MOUTH EVERY DAY 30 tablet 1  . fenofibrate 160 MG  tablet Take 1 tablet (160 mg total) by mouth daily. 90 tablet 3  . glucose blood (ONETOUCH VERIO) test strip Use to test blood sugar once daily Dx code R73.03 30 each 4  . hydrocortisone valerate cream (WESTCORT) 0.2 % Apply 1 application topically daily as needed (ITCHING).    Marland Kitchen irbesartan (AVAPRO) 150 MG tablet Take 1 tablet (150 mg total) by mouth 2 (two) times daily. 180 tablet 1  . metFORMIN (GLUCOPHAGE-XR) 500 MG 24 hr tablet TAKE 1 TABLET BY MOUTH DAILY WITH FOOD FOR 1 WEEK, THEN INCREASE TO 2 TABLETS DAILY WITH FOOD 174 tablet 0  . Multiple Vitamin (MULTIVITAMIN WITH MINERALS) TABS Take 1 tablet by mouth daily.    . nitroGLYCERIN (NITROSTAT) 0.4 MG SL tablet PLACE 1 TABLET UNDER THE TONGUE EVERY 5 MINUTES AS NEEDED FOR CHEST PAIN 25 tablet 6   No current facility-administered medications for this visit.     Allergies  Allergen Reactions  . Statins Other (See Comments)    "intermittent loss of circulation; hands and arms will go to sleep; feet will get cramps; fatigue" (05/13/2012).  This occurred with Lipitor, Zocor, Crestor 5 mg qd, and she thinks pravastatin as well  . Amlodipine Itching  . Gadolinium Derivatives Itching and Nausea Only    Pt had severe nausea and itching on legs, neck and back, per dr [name] pt needs 13 hour prep before contrast in the future  . Metformin And Related Other (See Comments)    Abdominal bloating    Social History   Social History  . Marital status: Divorced    Spouse name: N/A  . Number of children: N/A  . Years of education: N/A   Occupational History  . Not on file.   Social History Main Topics  . Smoking status: Former Smoker    Packs/day: 0.50    Years: 20.00    Types: Cigarettes    Quit date: 07/05/1988  . Smokeless tobacco: Never Used  . Alcohol use 7.2 oz/week    12 Glasses of wine per week     Comment: 05/13/2012 "6-8 oz glass of red wine q hs"   . Drug use: No  . Sexual activity: No   Other Topics Concern  . Not on  file   Social History Narrative  . No narrative on file     Review of Systems: General: negative for chills, fever, night sweats or weight changes.  Cardiovascular: negative for chest pain, dyspnea on exertion, edema, orthopnea, palpitations, paroxysmal nocturnal dyspnea or shortness of breath Dermatological: negative for rash Respiratory: negative for cough or wheezing Urologic: negative for hematuria Abdominal: negative for nausea, vomiting, diarrhea, bright red blood per rectum, melena, or hematemesis Neurologic: negative for visual changes, syncope, or dizziness All other systems reviewed and are otherwise negative except as noted above.    Blood pressure (!) 152/72, pulse 66, height 5' 1.5" (1.562 m), weight 138 lb (62.6 kg).  General appearance: alert and no distress Neck: no adenopathy, no carotid bruit, no JVD, supple, symmetrical, trachea midline and thyroid not enlarged, symmetric, no tenderness/mass/nodules Lungs:  clear to auscultation bilaterally Heart: regular rate and rhythm, S1, S2 normal, no murmur, click, rub or gallop Extremities: extremities normal, atraumatic, no cyanosis or edema  EKG sinus rhythm at 66 with septal Q waves and left anterior fascicular block. I personally reviewed this EKG.  ASSESSMENT AND PLAN:   CAD- residual total RCA and Dx disease History of coronary artery disease status post cardiac catheterization which I performed 07/02/15 via the right radial approach revealing a patent proximal LAD stent, 80% diagonal branch which is unchanged and occluded dominant RCA with left-to-right collaterals. She had normal LV function. She denies chest pain or shortness of breath.  Dyslipidemia History of dyslipidemia intolerant to statin drugs although she is on low-dose Lipitor and Zetia. Recent blood work performed by her PCP 06/27/16 revealed total cholesterol of 160, LDL 96 and HDL of 43.  Essential hypertension History of hypertension with blood pressure  measured today at 152/72. She is on chlorthalidone, carvedilol and Avapro. Continue current meds at current dosing      [name] MD [facility], [facility] 07/25/2016 2:36 PM

## 2016-07-28 DIAGNOSIS — M9903 Segmental and somatic dysfunction of lumbar region: Secondary | ICD-10-CM | POA: Diagnosis not present

## 2016-07-28 DIAGNOSIS — M9905 Segmental and somatic dysfunction of pelvic region: Secondary | ICD-10-CM | POA: Diagnosis not present

## 2016-07-28 DIAGNOSIS — S39012A Strain of muscle, fascia and tendon of lower back, initial encounter: Secondary | ICD-10-CM | POA: Diagnosis not present

## 2016-07-28 DIAGNOSIS — M9902 Segmental and somatic dysfunction of thoracic region: Secondary | ICD-10-CM | POA: Diagnosis not present

## 2016-07-28 DIAGNOSIS — S29012A Strain of muscle and tendon of back wall of thorax, initial encounter: Secondary | ICD-10-CM | POA: Diagnosis not present

## 2016-07-28 DIAGNOSIS — S338XXA Sprain of other parts of lumbar spine and pelvis, initial encounter: Secondary | ICD-10-CM | POA: Diagnosis not present

## 2016-07-29 NOTE — Telephone Encounter (Signed)
[facility] called in, they said they have been waiting for a call back since last week to determine the script for the test strips.  She stated that the Dx code on the script is not covered by Medicare and they need to speak with someone on how to proceed. CB# [phone]

## 2016-07-29 NOTE — Telephone Encounter (Signed)
The test strips that she was rx has a diagnosis code on it that is not being covered by her insurance please advise.  R73.03 is prediabetes-pt is going to call the pharmacy back and call the insurance

## 2016-07-30 ENCOUNTER — Other Ambulatory Visit: Payer: Self-pay

## 2016-07-30 DIAGNOSIS — L570 Actinic keratosis: Secondary | ICD-10-CM | POA: Diagnosis not present

## 2016-07-30 DIAGNOSIS — L905 Scar conditions and fibrosis of skin: Secondary | ICD-10-CM | POA: Diagnosis not present

## 2016-07-30 DIAGNOSIS — E119 Type 2 diabetes mellitus without complications: Secondary | ICD-10-CM | POA: Insufficient documentation

## 2016-07-30 DIAGNOSIS — D485 Neoplasm of uncertain behavior of skin: Secondary | ICD-10-CM | POA: Diagnosis not present

## 2016-07-30 MED ORDER — GLUCOSE BLOOD VI STRP: ORAL_STRIP | 4 refills | Status: AC

## 2016-07-30 NOTE — Telephone Encounter (Signed)
It has been changed and reordered

## 2016-07-30 NOTE — Telephone Encounter (Signed)
Change diagnosis to diabetes type  2 without complications

## 2016-07-30 NOTE — Telephone Encounter (Signed)
According to patient chart she is prediabetic and that is the Dx code

## 2016-08-26 DIAGNOSIS — E782 Mixed hyperlipidemia: Secondary | ICD-10-CM | POA: Diagnosis not present

## 2016-08-26 DIAGNOSIS — H353111 Nonexudative age-related macular degeneration, right eye, early dry stage: Secondary | ICD-10-CM | POA: Diagnosis not present

## 2016-08-26 DIAGNOSIS — R739 Hyperglycemia, unspecified: Secondary | ICD-10-CM | POA: Diagnosis not present

## 2016-08-26 DIAGNOSIS — H353121 Nonexudative age-related macular degeneration, left eye, early dry stage: Secondary | ICD-10-CM | POA: Diagnosis not present

## 2016-08-26 DIAGNOSIS — I2510 Atherosclerotic heart disease of native coronary artery without angina pectoris: Secondary | ICD-10-CM | POA: Diagnosis not present

## 2016-08-26 DIAGNOSIS — I10 Essential (primary) hypertension: Secondary | ICD-10-CM | POA: Diagnosis not present

## 2016-09-01 DIAGNOSIS — S29012A Strain of muscle and tendon of back wall of thorax, initial encounter: Secondary | ICD-10-CM | POA: Diagnosis not present

## 2016-09-01 DIAGNOSIS — S39012A Strain of muscle, fascia and tendon of lower back, initial encounter: Secondary | ICD-10-CM | POA: Diagnosis not present

## 2016-09-01 DIAGNOSIS — M9902 Segmental and somatic dysfunction of thoracic region: Secondary | ICD-10-CM | POA: Diagnosis not present

## 2016-09-01 DIAGNOSIS — M9905 Segmental and somatic dysfunction of pelvic region: Secondary | ICD-10-CM | POA: Diagnosis not present

## 2016-09-01 DIAGNOSIS — M9903 Segmental and somatic dysfunction of lumbar region: Secondary | ICD-10-CM | POA: Diagnosis not present

## 2016-09-01 DIAGNOSIS — S338XXA Sprain of other parts of lumbar spine and pelvis, initial encounter: Secondary | ICD-10-CM | POA: Diagnosis not present

## 2016-09-09 DIAGNOSIS — M19072 Primary osteoarthritis, left ankle and foot: Secondary | ICD-10-CM | POA: Diagnosis not present

## 2016-09-09 DIAGNOSIS — M19071 Primary osteoarthritis, right ankle and foot: Secondary | ICD-10-CM | POA: Diagnosis not present

## 2016-09-11 ENCOUNTER — Other Ambulatory Visit: Payer: Self-pay | Admitting: Cardiovascular Disease

## 2016-09-11 NOTE — Telephone Encounter (Signed)
Rx has been sent to the pharmacy electronically. ° °

## 2016-09-15 ENCOUNTER — Other Ambulatory Visit: Payer: Self-pay | Admitting: *Deleted

## 2016-09-15 MED ORDER — ATORVASTATIN CALCIUM 10 MG PO TABS: 10.0000 mg | ORAL_TABLET | Freq: Every day | ORAL | 3 refills | Status: DC

## 2016-09-15 NOTE — Telephone Encounter (Signed)
Rx has been sent to the pharmacy electronically. ° °

## 2016-09-16 ENCOUNTER — Other Ambulatory Visit: Payer: Self-pay | Admitting: Cardiovascular Disease

## 2016-09-16 NOTE — Telephone Encounter (Signed)
REFILL 

## 2016-09-28 ENCOUNTER — Other Ambulatory Visit: Payer: Self-pay | Admitting: Cardiovascular Disease

## 2016-09-28 ENCOUNTER — Other Ambulatory Visit: Payer: Self-pay | Admitting: Endocrinology

## 2016-09-29 NOTE — Telephone Encounter (Signed)
Rx has been sent to the pharmacy electronically. ° °

## 2016-10-01 DIAGNOSIS — S93492A Sprain of other ligament of left ankle, initial encounter: Secondary | ICD-10-CM | POA: Diagnosis not present

## 2016-10-06 ENCOUNTER — Other Ambulatory Visit: Payer: Self-pay | Admitting: *Deleted

## 2016-10-06 DIAGNOSIS — M9903 Segmental and somatic dysfunction of lumbar region: Secondary | ICD-10-CM | POA: Diagnosis not present

## 2016-10-06 DIAGNOSIS — S338XXA Sprain of other parts of lumbar spine and pelvis, initial encounter: Secondary | ICD-10-CM | POA: Diagnosis not present

## 2016-10-06 DIAGNOSIS — M9905 Segmental and somatic dysfunction of pelvic region: Secondary | ICD-10-CM | POA: Diagnosis not present

## 2016-10-06 DIAGNOSIS — S39012A Strain of muscle, fascia and tendon of lower back, initial encounter: Secondary | ICD-10-CM | POA: Diagnosis not present

## 2016-10-06 DIAGNOSIS — M9902 Segmental and somatic dysfunction of thoracic region: Secondary | ICD-10-CM | POA: Diagnosis not present

## 2016-10-06 DIAGNOSIS — S29012A Strain of muscle and tendon of back wall of thorax, initial encounter: Secondary | ICD-10-CM | POA: Diagnosis not present

## 2016-10-06 MED ORDER — FENOFIBRATE 160 MG PO TABS: 160.0000 mg | ORAL_TABLET | Freq: Every day | ORAL | 3 refills | Status: DC

## 2016-10-08 DIAGNOSIS — M19071 Primary osteoarthritis, right ankle and foot: Secondary | ICD-10-CM | POA: Diagnosis not present

## 2016-10-16 ENCOUNTER — Other Ambulatory Visit (INDEPENDENT_AMBULATORY_CARE_PROVIDER_SITE_OTHER): Payer: Medicare Other

## 2016-10-16 DIAGNOSIS — R7303 Prediabetes: Secondary | ICD-10-CM | POA: Diagnosis not present

## 2016-10-16 LAB — BASIC METABOLIC PANEL
BUN: 13 mg/dL (ref 6–23)
CO2: 28 mEq/L (ref 19–32)
Calcium: 9.6 mg/dL (ref 8.4–10.5)
Chloride: 98 mEq/L (ref 96–112)
Creatinine, Ser: 0.72 mg/dL (ref 0.40–1.20)
GFR: 84.23 mL/min (ref >60.00)
Glucose, Bld: 94 mg/dL (ref 70–99)
Potassium: 3.9 mEq/L (ref 3.5–5.1)
Sodium: 131 mEq/L — ABNORMAL LOW (ref 135–145)

## 2016-10-16 LAB — HEMOGLOBIN A1C: Hgb A1c MFr Bld: 6 % (ref 4.6–6.5)

## 2016-10-17 ENCOUNTER — Other Ambulatory Visit: Payer: Medicare Other

## 2016-10-21 ENCOUNTER — Encounter: Payer: Self-pay | Admitting: Endocrinology

## 2016-10-21 ENCOUNTER — Ambulatory Visit (INDEPENDENT_AMBULATORY_CARE_PROVIDER_SITE_OTHER): Payer: Medicare Other | Admitting: Endocrinology

## 2016-10-21 VITALS — BP 150/86 | HR 65 | Ht 61.0 in | Wt 136.2 lb

## 2016-10-21 DIAGNOSIS — R7303 Prediabetes: Secondary | ICD-10-CM | POA: Diagnosis not present

## 2016-10-21 DIAGNOSIS — E782 Mixed hyperlipidemia: Secondary | ICD-10-CM

## 2016-10-21 DIAGNOSIS — I2510 Atherosclerotic heart disease of native coronary artery without angina pectoris: Secondary | ICD-10-CM

## 2016-10-21 NOTE — Patient Instructions (Signed)
[facility] meter, get the one with a download port

## 2016-10-21 NOTE — Progress Notes (Signed)
Patient ID: Brittany Figueroa, female   DOB: 08-Jan-1941, 76 y.o.   MRN: [id number]           Chief complaint:  Follow-up of prediabetes  History of Present Illness:  She was told by her PCP that she has type 2 diabetes based on an A1c of 6.5% in 07/2013 She has not had any other A1c is subsequently above normal, normal range and previous lab 6.2 or less She also has not had any abnormal blood sugars except a glucose of 125 in 3/16  She does have increased fasting blood sugars ranging from 108-125 since about 2012  She may have been told about 10 years ago that she had prediabetes and reportedly had a normal glucose tolerance test.  GLUCOSE TOLERANCE test on 05/23/15: Fasting glucose 110, two-hour glucose 192  RECENT history:   With her A1c of 6.2 she was restarted on metformin ER in 4/17 She was taking 1500 mg a day but could not tolerate this because of feeling of bloating  Subsequently in 1/18 when A1c was 6.5 and glucose 133 fasting she was told to start back on metformin and also improve her diet Not taking 500 mg twice a day with minimal bloating as side effect  A1c is now 6%   She has been trying to do well with her diet improving intake of carbohydrates and portions She had refused to see the dietitian. Also is trying to exercise more regularly, more recently was not able to do this because of sprain of her ankle She has lost weight  Home readings fasting recently: 89-1 26 with only one reading above 100 Median blood sugar in the morning 93 After meals her blood sugars are generally below 130 with high readings only twice in the afternoon reading 150 and 211  The weight history: Previous range 139-143  Wt Readings from Last 3 Encounters:  10/21/16 136 lb 3.2 oz (61.8 kg)  07/25/16 138 lb (62.6 kg)  07/21/16 141 lb (64 kg)    Lab Results  Component Value Date   HGBA1C 6.0 10/16/2016   HGBA1C 6.5 06/03/2016   HGBA1C 6.2 03/03/2016   Lab Results  Component Value Date     LDLCALC 89 12/24/2015   CREATININE 0.72 10/16/2016     Appointment on 10/16/2016  Component Date Value Ref Range Status  . Hgb A1c MFr Bld 10/16/2016 6.0  4.6 - 6.5 % Final   Glycemic Control Guidelines for People with Diabetes:Non Diabetic:  <6%Goal of Therapy: <7%Additional Action Suggested:  >8%   . Sodium 10/16/2016 131* 135 - 145 mEq/L Final  . Potassium 10/16/2016 3.9  3.5 - 5.1 mEq/L Final  . Chloride 10/16/2016 98  96 - 112 mEq/L Final  . CO2 10/16/2016 28  19 - 32 mEq/L Final  . Glucose, Bld 10/16/2016 94  70 - 99 mg/dL Final  . BUN 10/16/2016 13  6 - 23 mg/dL Final  . Creatinine, Ser 10/16/2016 0.72  0.40 - 1.20 mg/dL Final  . Calcium 10/16/2016 9.6  8.4 - 10.5 mg/dL Final  . GFR 10/16/2016 84.23  >60.00 mL/min Final      Past Medical History:  Diagnosis Date  . Abnormal finding on EKG, new anterolateral T-wave inversions  05/14/2012  . Abnormal nuclear stress test, with infero-lateral ischemia 05/14/2012  . Arthritis    "right thumb; all my fingers" (05/13/2012), cervical spondylosis   . Atypical angina ([place]) 05/14/2012  . CAD (coronary artery disease), 05/13/12, with 80% LAD 05/14/2012  .  Cancer ([place])    basal cell on facial in L temporal region    . CMC arthritis, thumb, degenerative   . Complication of anesthesia 12/2009   "had endoscopy; larynx went into spasms; stopped breathing for 15 seconds" (05/13/2012)  . GERD (gastroesophageal reflux disease) 2011  . Hypercholesteremia   . Hypertension   . S/P angioplasty with stent, LAD 05/13/12 05/14/2012  . Tuberculosis    test +- [place] [facility] - Dr. [name]- CXR- OK    Past Surgical History:  Procedure Laterality Date  . ANTERIOR CERVICAL DECOMP/DISCECTOMY FUSION N/A 12/13/2013   Procedure: ANTERIOR CERVICAL DECOMPRESSION/DISCECTOMY FUSION 3 LEVELS Cervical four/five,five/six,six/seven. Anterior cervical disectomy and fusion with peek and plate ;  Surgeon: [name], MD;  Location: [place] [facility];  Service:  Neurosurgery;  Laterality: N/A;  . CARDIAC CATHETERIZATION N/A 07/02/2015   Procedure: Left Heart Cath and Coronary Angiography;  Surgeon: [name], MD;  Location: [facility];  Service: Cardiovascular;  Laterality: N/A;  . CORONARY ANGIOPLASTY WITH STENT PLACEMENT  05/13/2012   DES to LAD; she has total RCA with left to rt coll. normal LV function done for positive nuc study  . DILATION AND CURETTAGE OF UTERUS  1960's; 1970's; 1980   "probably 3" (05/13/2012)  . FOREARM / WRIST TENDON LESION EXCISION  ?11/2001   "right; did waver to try to get ulnar out of hole that it had cut" (05/13/2012  . INGUINAL HERNIA REPAIR  ~ 1966; 07/21/2002   "right; left" (05/13/2012)  . LEFT HEART CATHETERIZATION WITH CORONARY ANGIOGRAM N/A 05/13/2012   Procedure: LEFT HEART CATHETERIZATION WITH CORONARY ANGIOGRAM;  Surgeon: [name], MD;  Location: [facility];  Service: Cardiovascular;  Laterality: N/A;  . OSTEOTOMY AND ULNAR SHORTENING  07/21/2002   "right" (05/13/2012)  . TONSILLECTOMY AND ADENOIDECTOMY  1950's  . TUBAL LIGATION  1980    Family History  Problem Relation Age of Onset  . Hypertension Mother   . Diabetes Mother   . Hyperlipidemia Father   . Heart disease Father   . Stroke Father   . Heart disease Sister   . Diabetes Sister   . Heart disease Brother   . Diabetes Maternal Aunt     Social History:  reports that she quit smoking about 28 years ago. Her smoking use included Cigarettes. She has a 10.00 pack-year smoking history. She has never used smokeless tobacco. She reports that she drinks about 7.2 oz of alcohol per week . She reports that she does not use drugs.  Allergies:  Allergies  Allergen Reactions  . Statins Other (See Comments)    "intermittent loss of circulation; hands and arms will go to sleep; feet will get cramps; fatigue" (05/13/2012).  This occurred with Lipitor, Zocor, Crestor 5 mg qd, and she thinks pravastatin as well  . Amlodipine Itching  .  Gadolinium Derivatives Itching and Nausea Only    Pt had severe nausea and itching on legs, neck and back, per dr [name] pt needs 13 hour prep before contrast in the future  . Metformin And Related Other (See Comments)    Abdominal bloating    Allergies as of 10/21/2016      Reactions   Statins Other (See Comments)   "intermittent loss of circulation; hands and arms will go to sleep; feet will get cramps; fatigue" (05/13/2012).  This occurred with Lipitor, Zocor, Crestor 5 mg qd, and she thinks pravastatin as well   Amlodipine Itching   Gadolinium Derivatives Itching,  Nausea Only   Pt had severe nausea and itching on legs, neck and back, per dr [name] pt needs 13 hour prep before contrast in the future   Metformin And Related Other (See Comments)   Abdominal bloating      Medication List       Accurate as of 10/21/16 10:06 AM. Always use your most recent med list.          acetaminophen 500 MG tablet Commonly known as:  TYLENOL Take 500 mg by mouth every 6 (six) hours as needed for mild pain or headache.   aspirin 81 MG tablet Take 81 mg by mouth daily.   atorvastatin 10 MG tablet Commonly known as:  LIPITOR Take 1 tablet (10 mg total) by mouth daily.   calcium-vitamin D 500-200 MG-UNIT tablet Commonly known as:  OSCAL WITH D Take 2 tablets by mouth daily.   carvedilol 6.25 MG tablet Commonly known as:  COREG Take 6.25 mg by mouth 2 (two) times daily with a meal.   chlorthalidone 25 MG tablet Commonly known as:  HYGROTON TAKE 1/2 TABLET(12.5 MG) BY MOUTH DAILY   clopidogrel 75 MG tablet Commonly known as:  PLAVIX TAKE 1 TABLET BY MOUTH EVERY DAY   diclofenac sodium 1 % Gel Commonly known as:  VOLTAREN Apply 2 g topically daily as needed. For pain   ezetimibe 10 MG tablet Commonly known as:  ZETIA TAKE 1 TABLET BY MOUTH EVERY DAY   fenofibrate 160 MG tablet Take 1 tablet (160 mg total) by mouth daily.   glucose blood test strip Commonly known as:  ONETOUCH  VERIO Use to test blood sugar once daily Dx code E11.9   hydrocortisone valerate cream 0.2 % Commonly known as:  WESTCORT Apply 1 application topically daily as needed (ITCHING).   irbesartan 300 MG tablet Commonly known as:  AVAPRO TAKE 1 TABLET(300 MG) BY MOUTH DAILY   metFORMIN 500 MG 24 hr tablet Commonly known as:  GLUCOPHAGE-XR TAKE 1 TABLET BY MOUTH DAILY WITH FOOD FOR 1 WEEK, THEN INCREASE TO 2 TABLETS DAILY WITH FOOD   multivitamin with minerals Tabs tablet Take 1 tablet by mouth daily.   nitroGLYCERIN 0.4 MG SL tablet Commonly known as:  NITROSTAT PLACE 1 TABLET UNDER THE TONGUE EVERY 5 MINUTES AS NEEDED FOR CHEST PAIN   Vitamin D3 2000 units Tabs Take 2,000 Units by mouth 2 (two) times daily.       LABS:  Appointment on 10/16/2016  Component Date Value Ref Range Status  . Hgb A1c MFr Bld 10/16/2016 6.0  4.6 - 6.5 % Final   Glycemic Control Guidelines for People with Diabetes:Non Diabetic:  <6%Goal of Therapy: <7%Additional Action Suggested:  >8%   . Sodium 10/16/2016 131* 135 - 145 mEq/L Final  . Potassium 10/16/2016 3.9  3.5 - 5.1 mEq/L Final  . Chloride 10/16/2016 98  96 - 112 mEq/L Final  . CO2 10/16/2016 28  19 - 32 mEq/L Final  . Glucose, Bld 10/16/2016 94  70 - 99 mg/dL Final  . BUN 10/16/2016 13  6 - 23 mg/dL Final  . Creatinine, Ser 10/16/2016 0.72  0.40 - 1.20 mg/dL Final  . Calcium 10/16/2016 9.6  8.4 - 10.5 mg/dL Final  . GFR 10/16/2016 84.23  >60.00 mL/min Final      Review of Systems  Other active medical issues include:  Hypertension, treated with Avapro and Coreg, Followed by PCP  History of hyperlipidemia with intolerance to statins, currently on Fenofibrate, 10 mg  Lipitor without side effects  Also on Zetia.  This is followed by cardiologist in the lipid clinic  Last LDL 96   ?  HYPOTHYROIDISM: TSH 4.55 in 8/17 and subsequently 5.6 This is back to normal without any treatment  Lab Results  Component Value Date   TSH 3.84  07/18/2016   TSH 5.59 (H) 06/03/2016   TSH 2.72 06/27/2015   FREET4 1.03 06/03/2016      PHYSICAL EXAM:  BP (!) 150/86   Pulse 65   Ht 5\' 1"  (5.409 m)   Wt 136 lb 3.2 oz (61.8 kg)   SpO2 98%   BMI 25.73 kg/m     ASSESSMENT:   Prediabetes with impaired fasting glucose and also impaired glucose tolerance She has had a glucose over 125 only once in the lab Highest A1c 6.5  With her losing weight and doing well with her diet and exercise regimen since last visit along with continuing metformin her A1c is now down to 6% Most of her blood sugars are near normal also   HYPONATREMIA: Advised her to discuss stopping her chlorthalidone with PCP  PLAN:   She was congratulated on her improvement She will continue to watch her diet and carbohydrates Resume exercise when able to Since she is not getting coverage for breakfast if she can try the [employer] brand Follow-up with PCP for hypertension  Follow-up in 4 months   Kacper Cartlidge 10/21/2016, 10:06 AM

## 2016-11-03 DIAGNOSIS — S338XXA Sprain of other parts of lumbar spine and pelvis, initial encounter: Secondary | ICD-10-CM | POA: Diagnosis not present

## 2016-11-03 DIAGNOSIS — S39012A Strain of muscle, fascia and tendon of lower back, initial encounter: Secondary | ICD-10-CM | POA: Diagnosis not present

## 2016-11-03 DIAGNOSIS — M9905 Segmental and somatic dysfunction of pelvic region: Secondary | ICD-10-CM | POA: Diagnosis not present

## 2016-11-03 DIAGNOSIS — M9902 Segmental and somatic dysfunction of thoracic region: Secondary | ICD-10-CM | POA: Diagnosis not present

## 2016-11-03 DIAGNOSIS — S29012A Strain of muscle and tendon of back wall of thorax, initial encounter: Secondary | ICD-10-CM | POA: Diagnosis not present

## 2016-11-03 DIAGNOSIS — M9903 Segmental and somatic dysfunction of lumbar region: Secondary | ICD-10-CM | POA: Diagnosis not present

## 2016-11-04 DIAGNOSIS — Z1231 Encounter for screening mammogram for malignant neoplasm of breast: Secondary | ICD-10-CM | POA: Diagnosis not present

## 2016-12-08 DIAGNOSIS — M9903 Segmental and somatic dysfunction of lumbar region: Secondary | ICD-10-CM | POA: Diagnosis not present

## 2016-12-08 DIAGNOSIS — S338XXA Sprain of other parts of lumbar spine and pelvis, initial encounter: Secondary | ICD-10-CM | POA: Diagnosis not present

## 2016-12-08 DIAGNOSIS — M9905 Segmental and somatic dysfunction of pelvic region: Secondary | ICD-10-CM | POA: Diagnosis not present

## 2016-12-08 DIAGNOSIS — M9902 Segmental and somatic dysfunction of thoracic region: Secondary | ICD-10-CM | POA: Diagnosis not present

## 2016-12-08 DIAGNOSIS — S39012A Strain of muscle, fascia and tendon of lower back, initial encounter: Secondary | ICD-10-CM | POA: Diagnosis not present

## 2016-12-08 DIAGNOSIS — S29012A Strain of muscle and tendon of back wall of thorax, initial encounter: Secondary | ICD-10-CM | POA: Diagnosis not present

## 2016-12-12 ENCOUNTER — Other Ambulatory Visit: Payer: Self-pay | Admitting: Cardiovascular Disease

## 2016-12-24 DIAGNOSIS — Z79899 Other long term (current) drug therapy: Secondary | ICD-10-CM | POA: Diagnosis not present

## 2016-12-24 DIAGNOSIS — R739 Hyperglycemia, unspecified: Secondary | ICD-10-CM | POA: Diagnosis not present

## 2016-12-24 DIAGNOSIS — I10 Essential (primary) hypertension: Secondary | ICD-10-CM | POA: Diagnosis not present

## 2016-12-24 DIAGNOSIS — M4692 Unspecified inflammatory spondylopathy, cervical region: Secondary | ICD-10-CM | POA: Diagnosis not present

## 2016-12-24 DIAGNOSIS — R7309 Other abnormal glucose: Secondary | ICD-10-CM | POA: Diagnosis not present

## 2017-01-02 DIAGNOSIS — E782 Mixed hyperlipidemia: Secondary | ICD-10-CM | POA: Diagnosis not present

## 2017-01-05 DIAGNOSIS — S39012A Strain of muscle, fascia and tendon of lower back, initial encounter: Secondary | ICD-10-CM | POA: Diagnosis not present

## 2017-01-05 DIAGNOSIS — M9903 Segmental and somatic dysfunction of lumbar region: Secondary | ICD-10-CM | POA: Diagnosis not present

## 2017-01-05 DIAGNOSIS — S338XXA Sprain of other parts of lumbar spine and pelvis, initial encounter: Secondary | ICD-10-CM | POA: Diagnosis not present

## 2017-01-05 DIAGNOSIS — M9905 Segmental and somatic dysfunction of pelvic region: Secondary | ICD-10-CM | POA: Diagnosis not present

## 2017-01-05 DIAGNOSIS — M9902 Segmental and somatic dysfunction of thoracic region: Secondary | ICD-10-CM | POA: Diagnosis not present

## 2017-01-05 DIAGNOSIS — S29012A Strain of muscle and tendon of back wall of thorax, initial encounter: Secondary | ICD-10-CM | POA: Diagnosis not present

## 2017-01-14 ENCOUNTER — Other Ambulatory Visit: Payer: Self-pay | Admitting: Endocrinology

## 2017-01-26 ENCOUNTER — Other Ambulatory Visit: Payer: Self-pay | Admitting: Cardiovascular Disease

## 2017-01-26 NOTE — Telephone Encounter (Signed)
Rx(s) sent to pharmacy electronically.  

## 2017-02-17 ENCOUNTER — Other Ambulatory Visit (INDEPENDENT_AMBULATORY_CARE_PROVIDER_SITE_OTHER): Payer: Medicare Other

## 2017-02-17 DIAGNOSIS — E782 Mixed hyperlipidemia: Secondary | ICD-10-CM | POA: Diagnosis not present

## 2017-02-17 DIAGNOSIS — R7303 Prediabetes: Secondary | ICD-10-CM

## 2017-02-17 LAB — HEMOGLOBIN A1C: Hgb A1c MFr Bld: 6 % (ref 4.6–6.5)

## 2017-02-17 LAB — LIPID PANEL
Cholesterol: 161 mg/dL (ref 0–200)
HDL: 42.4 mg/dL (ref >39.00)
LDL Cholesterol: 93 mg/dL (ref 0–99)
NonHDL: 118.59
Total CHOL/HDL Ratio: 4
Triglycerides: 127 mg/dL (ref 0.0–149.0)
VLDL: 25.4 mg/dL (ref 0.0–40.0)

## 2017-02-17 LAB — COMPREHENSIVE METABOLIC PANEL
ALT: 19 U/L (ref 0–35)
AST: 18 U/L (ref 0–37)
Albumin: 4.4 g/dL (ref 3.5–5.2)
Alkaline Phosphatase: 47 U/L (ref 39–117)
BUN: 17 mg/dL (ref 6–23)
CO2: 28 mEq/L (ref 19–32)
Calcium: 10.2 mg/dL (ref 8.4–10.5)
Chloride: 99 mEq/L (ref 96–112)
Creatinine, Ser: 0.75 mg/dL (ref 0.40–1.20)
GFR: 80.27 mL/min (ref >60.00)
Glucose, Bld: 107 mg/dL — ABNORMAL HIGH (ref 70–99)
Potassium: 3.9 mEq/L (ref 3.5–5.1)
Sodium: 134 mEq/L — ABNORMAL LOW (ref 135–145)
Total Bilirubin: 0.6 mg/dL (ref 0.2–1.2)
Total Protein: 7.2 g/dL (ref 6.0–8.3)

## 2017-02-18 DIAGNOSIS — H35363 Drusen (degenerative) of macula, bilateral: Secondary | ICD-10-CM | POA: Diagnosis not present

## 2017-02-18 DIAGNOSIS — H1045 Other chronic allergic conjunctivitis: Secondary | ICD-10-CM | POA: Diagnosis not present

## 2017-02-18 DIAGNOSIS — H2513 Age-related nuclear cataract, bilateral: Secondary | ICD-10-CM | POA: Diagnosis not present

## 2017-02-20 ENCOUNTER — Ambulatory Visit (INDEPENDENT_AMBULATORY_CARE_PROVIDER_SITE_OTHER): Payer: Medicare Other | Admitting: Endocrinology

## 2017-02-20 ENCOUNTER — Encounter: Payer: Self-pay | Admitting: Endocrinology

## 2017-02-20 VITALS — BP 134/82 | HR 69 | Ht 61.0 in | Wt 135.0 lb

## 2017-02-20 DIAGNOSIS — I2510 Atherosclerotic heart disease of native coronary artery without angina pectoris: Secondary | ICD-10-CM | POA: Diagnosis not present

## 2017-02-20 DIAGNOSIS — R7989 Other specified abnormal findings of blood chemistry: Secondary | ICD-10-CM

## 2017-02-20 DIAGNOSIS — R7303 Prediabetes: Secondary | ICD-10-CM

## 2017-02-20 NOTE — Patient Instructions (Signed)
Try [employer] Neo meter

## 2017-02-20 NOTE — Progress Notes (Signed)
Patient ID: Brittany Figueroa, female   DOB: 1940-06-01, 76 y.o.   MRN: [id number]           Chief complaint:  Follow-up of prediabetes  History of Present Illness:  She was told by her PCP that she has type 2 diabetes based on an A1c of 6.5% in 07/2013 She has not had any other A1c is subsequently above normal, normal range and previous lab 6.2 or less She also has not had any abnormal blood sugars except a glucose of 125 in 3/16  She does have increased fasting blood sugars ranging from 108-125 since about 2012  She may have been told about 10 years ago that she had prediabetes and reportedly had a normal glucose tolerance test.  GLUCOSE TOLERANCE test on 05/23/15: Fasting glucose 110, two-hour glucose 192  RECENT history:   With her A1c of 6.2 she was restarted on metformin ER in 4/17 She was taking 1500 mg a day but could not tolerate this because of feeling of bloating  Subsequently in 1/18 when A1c was 6.5 and glucose 133 fasting she was told to start back on metformin and also improve her diet Not taking 500 mg twice a day with minimal bloating as side effect  A1c is the same now at 6%   Home blood sugars as below, has fairly good fasting readings although still averaging just over 100 Postprandial readings are also fairly good when she is checking them which is not very much and highest 150 She has been able to follow her diet with reducing intake of carbohydrates and portions She had refused to see the dietitian.  She still has difficulty exercising because of various problems but is trying to be as active as possible  Home readings using the ONE TOUCH monitor:  Mean values apply above for all meters except median for One Touch  PRE-MEAL Fasting Lunch Dinner Bedtime Overall  Glucose range: 89-1 28    101-150    Mean/median: 104    129  106     Weight history: Previous range 139-143  Wt Readings from Last 3 Encounters:  02/20/17 135 lb (61.2 kg)  10/21/16 136 lb 3.2  oz (61.8 kg)  07/25/16 138 lb (62.6 kg)    Lab Results  Component Value Date   HGBA1C 6.0 02/17/2017   HGBA1C 6.0 10/16/2016   HGBA1C 6.5 06/03/2016   Lab Results  Component Value Date   LDLCALC 93 02/17/2017   CREATININE 0.75 02/17/2017     Lab on 02/17/2017  Component Date Value Ref Range Status  . Hgb A1c MFr Bld 02/17/2017 6.0  4.6 - 6.5 % Final   Glycemic Control Guidelines for People with Diabetes:Non Diabetic:  <6%Goal of Therapy: <7%Additional Action Suggested:  >8%   . Sodium 02/17/2017 134* 135 - 145 mEq/L Final  . Potassium 02/17/2017 3.9  3.5 - 5.1 mEq/L Final  . Chloride 02/17/2017 99  96 - 112 mEq/L Final  . CO2 02/17/2017 28  19 - 32 mEq/L Final  . Glucose, Bld 02/17/2017 107* 70 - 99 mg/dL Final  . BUN 02/17/2017 17  6 - 23 mg/dL Final  . Creatinine, Ser 02/17/2017 0.75  0.40 - 1.20 mg/dL Final  . Total Bilirubin 02/17/2017 0.6  0.2 - 1.2 mg/dL Final  . Alkaline Phosphatase 02/17/2017 47  39 - 117 U/L Final  . AST 02/17/2017 18  0 - 37 U/L Final  . ALT 02/17/2017 19  0 - 35 U/L Final  .  Total Protein 02/17/2017 7.2  6.0 - 8.3 g/dL Final  . Albumin 02/17/2017 4.4  3.5 - 5.2 g/dL Final  . Calcium 02/17/2017 10.2  8.4 - 10.5 mg/dL Final  . GFR 02/17/2017 80.27  >60.00 mL/min Final  . Cholesterol 02/17/2017 161  0 - 200 mg/dL Final   ATP III Classification       Desirable:  < 200 mg/dL               Borderline High:  200 - 239 mg/dL          High:  > = 240 mg/dL  . Triglycerides 02/17/2017 127.0  0.0 - 149.0 mg/dL Final   Normal:  <150 mg/dLBorderline High:  150 - 199 mg/dL  . HDL 02/17/2017 42.40  >39.00 mg/dL Final  . VLDL 02/17/2017 25.4  0.0 - 40.0 mg/dL Final  . LDL Cholesterol 02/17/2017 93  0 - 99 mg/dL Final  . Total CHOL/HDL Ratio 02/17/2017 4   Final                  Men          Women1/2 Average Risk     3.4          3.3Average Risk          5.0          4.42X Average Risk          9.6          7.13X Average Risk          15.0          11.0                       . NonHDL 02/17/2017 118.59   Final   NOTE:  Non-HDL goal should be 30 mg/dL higher than patient's LDL goal (i.e. LDL goal of < 70 mg/dL, would have non-HDL goal of < 100 mg/dL)      Past Medical History:  Diagnosis Date  . Abnormal finding on EKG, new anterolateral T-wave inversions  05/14/2012  . Abnormal nuclear stress test, with infero-lateral ischemia 05/14/2012  . Arthritis    "right thumb; all my fingers" (05/13/2012), cervical spondylosis   . Atypical angina ([place]) 05/14/2012  . CAD (coronary artery disease), 05/13/12, with 80% LAD 05/14/2012  . Cancer ([place])    basal cell on facial in L temporal region    . [facility]MC arthritis, thumb, degenerative   . Complication of anesthesia 12/2009   "had endoscopy; larynx went into spasms; stopped breathing for 15 seconds" (05/13/2012)  . GERD (gastroesophageal reflux disease) 2011  . Hypercholesteremia   . Hypertension   . S/P angioplasty with stent, LAD 05/13/12 05/14/2012  . Tuberculosis    test +- [place] [facility] - Dr. [name]- CXR- OK    Past Surgical History:  Procedure Laterality Date  . ANTERIOR CERVICAL DECOMP/DISCECTOMY FUSION N/A 12/13/2013   Procedure: ANTERIOR CERVICAL DECOMPRESSION/DISCECTOMY FUSION 3 LEVELS Cervical four/five,five/six,six/seven. Anterior cervical disectomy and fusion with peek and plate ;  Surgeon: [name], MD;  Location: [place] [facility];  Service: Neurosurgery;  Laterality: N/A;  . CARDIAC CATHETERIZATION N/A 07/02/2015   Procedure: Left Heart Cath and Coronary Angiography;  Surgeon: [name], MD;  Location: [place] [facility];  Service: Cardiovascular;  Laterality: N/A;  . CORONARY ANGIOPLASTY WITH STENT PLACEMENT  05/13/2012   DES to LAD; she has total RCA with left to rt coll. normal LV  function done for positive nuc study  . DILATION AND CURETTAGE OF UTERUS  1960's; 1970's; 1980   "probably 3" (05/13/2012)  . FOREARM / WRIST TENDON LESION EXCISION  ?11/2001   "right; did waver to try to get  ulnar out of hole that it had cut" (05/13/2012  . INGUINAL HERNIA REPAIR  ~ 1966; 07/21/2002   "right; left" (05/13/2012)  . LEFT HEART CATHETERIZATION WITH CORONARY ANGIOGRAM N/A 05/13/2012   Procedure: LEFT HEART CATHETERIZATION WITH CORONARY ANGIOGRAM;  Surgeon: [name], MD;  [facility];  Service: Cardiovascular;  Laterality: N/A;  . OSTEOTOMY AND ULNAR SHORTENING  07/21/2002   "right" (05/13/2012)  . TONSILLECTOMY AND ADENOIDECTOMY  1950's  . TUBAL LIGATION  1980    Family History  Problem Relation Age of Onset  . Hypertension Mother   . Diabetes Mother   . Hyperlipidemia Father   . Heart disease Father   . Stroke Father   . Heart disease Sister   . Diabetes Sister   . Heart disease Brother   . Diabetes Maternal Aunt     Social History:  reports that she quit smoking about 28 years ago. Her smoking use included Cigarettes. She has a 10.00 pack-year smoking history. She has never used smokeless tobacco. She reports that she drinks about 7.2 oz of alcohol per week . She reports that she does not use drugs.  Allergies:  Allergies  Allergen Reactions  . Statins Other (See Comments)    "intermittent loss of circulation; hands and arms will go to sleep; feet will get cramps; fatigue" (05/13/2012).  This occurred with Lipitor, Zocor, Crestor 5 mg qd, and she thinks pravastatin as well  . Amlodipine Itching  . Gadolinium Derivatives Itching and Nausea Only    Pt had severe nausea and itching on legs, neck and back, per dr [name] pt needs 13 hour prep before contrast in the future  . Metformin And Related Other (See Comments)    Abdominal bloating    Allergies as of 02/20/2017      Reactions   Statins Other (See Comments)   "intermittent loss of circulation; hands and arms will go to sleep; feet will get cramps; fatigue" (05/13/2012).  This occurred with Lipitor, Zocor, Crestor 5 mg qd, and she thinks pravastatin as well   Amlodipine Itching   Gadolinium  Derivatives Itching, Nausea Only   Pt had severe nausea and itching on legs, neck and back, per dr [name] pt needs 13 hour prep before contrast in the future   Metformin And Related Other (See Comments)   Abdominal bloating      Medication List       Accurate as of 02/20/17  9:50 AM. Always use your most recent med list.          acetaminophen 500 MG tablet Commonly known as:  TYLENOL Take 500 mg by mouth every 6 (six) hours as needed for mild pain or headache.   aspirin 81 MG tablet Take 81 mg by mouth daily.   atorvastatin 10 MG tablet Commonly known as:  LIPITOR Take 1 tablet (10 mg total) by mouth daily.   calcium-vitamin D 500-200 MG-UNIT tablet Commonly known as:  OSCAL WITH D Take 2 tablets by mouth daily.   carvedilol 6.25 MG tablet Commonly known as:  COREG TAKE 1 TABLET BY MOUTH TWICE DAILY WITH FOOD   chlorthalidone 25 MG tablet Commonly known as:  HYGROTON TAKE 1/2 TABLET(12.5 MG) BY MOUTH DAILY   clopidogrel 75  MG tablet Commonly known as:  PLAVIX TAKE 1 TABLET BY MOUTH EVERY DAY   diclofenac sodium 1 % Gel Commonly known as:  VOLTAREN Apply 2 g topically daily as needed. For pain   ezetimibe 10 MG tablet Commonly known as:  ZETIA TAKE 1 TABLET BY MOUTH EVERY DAY   fenofibrate 160 MG tablet Take 1 tablet (160 mg total) by mouth daily.   glucose blood test strip Commonly known as:  ONETOUCH VERIO Use to test blood sugar once daily Dx code E11.9   hydrocortisone valerate cream 0.2 % Commonly known as:  WESTCORT Apply 1 application topically daily as needed (ITCHING).   irbesartan 300 MG tablet Commonly known as:  AVAPRO TAKE 1 TABLET(300 MG) BY MOUTH DAILY   metFORMIN 500 MG 24 hr tablet Commonly known as:  GLUCOPHAGE-XR TAKE 1 TABLET BY MOUTH DAILY WITH FOOD FOR 1 WEEK, THEN INCREASE TO 2 TABLETS DAILY WITH FOOD   multivitamin with minerals Tabs tablet Take 1 tablet by mouth daily.   nitroGLYCERIN 0.4 MG SL tablet Commonly known as:   NITROSTAT PLACE 1 TABLET UNDER THE TONGUE EVERY 5 MINUTES AS NEEDED FOR CHEST PAIN   Vitamin D3 2000 units Tabs Take 2,000 Units by mouth 2 (two) times daily.       LABS:  Lab on 02/17/2017  Component Date Value Ref Range Status  . Hgb A1c MFr Bld 02/17/2017 6.0  4.6 - 6.5 % Final   Glycemic Control Guidelines for People with Diabetes:Non Diabetic:  <6%Goal of Therapy: <7%Additional Action Suggested:  >8%   . Sodium 02/17/2017 134* 135 - 145 mEq/L Final  . Potassium 02/17/2017 3.9  3.5 - 5.1 mEq/L Final  . Chloride 02/17/2017 99  96 - 112 mEq/L Final  . CO2 02/17/2017 28  19 - 32 mEq/L Final  . Glucose, Bld 02/17/2017 107* 70 - 99 mg/dL Final  . BUN 02/17/2017 17  6 - 23 mg/dL Final  . Creatinine, Ser 02/17/2017 0.75  0.40 - 1.20 mg/dL Final  . Total Bilirubin 02/17/2017 0.6  0.2 - 1.2 mg/dL Final  . Alkaline Phosphatase 02/17/2017 47  39 - 117 U/L Final  . AST 02/17/2017 18  0 - 37 U/L Final  . ALT 02/17/2017 19  0 - 35 U/L Final  . Total Protein 02/17/2017 7.2  6.0 - 8.3 g/dL Final  . Albumin 02/17/2017 4.4  3.5 - 5.2 g/dL Final  . Calcium 02/17/2017 10.2  8.4 - 10.5 mg/dL Final  . GFR 02/17/2017 80.27  >60.00 mL/min Final  . Cholesterol 02/17/2017 161  0 - 200 mg/dL Final   ATP III Classification       Desirable:  < 200 mg/dL               Borderline High:  200 - 239 mg/dL          High:  > = 240 mg/dL  . Triglycerides 02/17/2017 127.0  0.0 - 149.0 mg/dL Final   Normal:  <150 mg/dLBorderline High:  150 - 199 mg/dL  . HDL 02/17/2017 42.40  >39.00 mg/dL Final  . VLDL 02/17/2017 25.4  0.0 - 40.0 mg/dL Final  . LDL Cholesterol 02/17/2017 93  0 - 99 mg/dL Final  . Total CHOL/HDL Ratio 02/17/2017 4   Final                  Men          Women1/2 Average Risk     3.4  3.3Average Risk          5.0          4.42X Average Risk          9.6          7.13X Average Risk          15.0          11.0                      . NonHDL 02/17/2017 118.59   Final   NOTE:  Non-HDL goal  should be 30 mg/dL higher than patient's LDL goal (i.e. LDL goal of < 70 mg/dL, would have non-HDL goal of < 100 mg/dL)      Review of Systems  Other active medical issues include:  Hypertension, treated with Avapro and Coreg, Followed by PCP  History of hyperlipidemia with intolerance to statins, currently on Fenofibrate, 10 mg Lipitor and also on Zetia.  This is followed by cardiologist in the lipid clinic  Last LDL 93  Lab Results  Component Value Date   CHOL 161 02/17/2017   HDL 42.40 02/17/2017   LDLCALC 93 02/17/2017   LDLDIRECT 130.1 04/04/2014   TRIG 127.0 02/17/2017   CHOLHDL 4 02/17/2017      ?  HYPOTHYROIDISM: TSH 4.55 in 8/17 and subsequently 5.6 This is back to normal without any treatment  Lab Results  Component Value Date   TSH 3.84 07/18/2016   TSH 5.59 (H) 06/03/2016   TSH 2.72 06/27/2015   FREET4 1.03 06/03/2016      PHYSICAL EXAM:  BP 134/82   Pulse 69   Ht 5\' 1"  (1.549 m)   Wt 135 lb (61.2 kg)   SpO2 97%   BMI 25.51 kg/m     ASSESSMENT:   Prediabetes with impaired fasting glucose and also impaired glucose tolerance Highest A1c Previously 6.5 .   Previously highest glucose overall 125 only once in the lab  Again has been doing well with her diet and exercise regimen since last visit along with continuing metformin   A1c is consistently down to 6% Most of her blood sugars are near normal also   PLAN:   Continue doing periodic glucose monitoring although she is complaining about cost  She can try to use the FreeStyle Neo which would be less expensive than the One Touch which is not covered by insurance because of having prediabetes only   LIPIDS: Excellent control with 3 drugs  Abnormal TSH: Will check again on the next visit    Brittany Figueroa 02/20/2017, 9:50 AM

## 2017-03-11 DIAGNOSIS — Z23 Encounter for immunization: Secondary | ICD-10-CM | POA: Diagnosis not present

## 2017-04-01 DIAGNOSIS — H1045 Other chronic allergic conjunctivitis: Secondary | ICD-10-CM | POA: Diagnosis not present

## 2017-04-01 DIAGNOSIS — H16141 Punctate keratitis, right eye: Secondary | ICD-10-CM | POA: Diagnosis not present

## 2017-04-10 ENCOUNTER — Other Ambulatory Visit: Payer: Self-pay | Admitting: Endocrinology

## 2017-04-10 ENCOUNTER — Other Ambulatory Visit: Payer: Self-pay | Admitting: Cardiovascular Disease

## 2017-04-21 DIAGNOSIS — Z79899 Other long term (current) drug therapy: Secondary | ICD-10-CM | POA: Diagnosis not present

## 2017-04-21 DIAGNOSIS — R040 Epistaxis: Secondary | ICD-10-CM | POA: Diagnosis not present

## 2017-04-21 DIAGNOSIS — H9201 Otalgia, right ear: Secondary | ICD-10-CM | POA: Diagnosis not present

## 2017-04-21 DIAGNOSIS — I10 Essential (primary) hypertension: Secondary | ICD-10-CM | POA: Diagnosis not present

## 2017-04-21 DIAGNOSIS — E782 Mixed hyperlipidemia: Secondary | ICD-10-CM | POA: Diagnosis not present

## 2017-04-21 DIAGNOSIS — R7303 Prediabetes: Secondary | ICD-10-CM | POA: Diagnosis not present

## 2017-05-05 DIAGNOSIS — H25013 Cortical age-related cataract, bilateral: Secondary | ICD-10-CM | POA: Diagnosis not present

## 2017-05-05 DIAGNOSIS — H353112 Nonexudative age-related macular degeneration, right eye, intermediate dry stage: Secondary | ICD-10-CM | POA: Diagnosis not present

## 2017-05-05 DIAGNOSIS — H2513 Age-related nuclear cataract, bilateral: Secondary | ICD-10-CM | POA: Diagnosis not present

## 2017-05-05 DIAGNOSIS — H353121 Nonexudative age-related macular degeneration, left eye, early dry stage: Secondary | ICD-10-CM | POA: Diagnosis not present

## 2017-06-02 DIAGNOSIS — S338XXA Sprain of other parts of lumbar spine and pelvis, initial encounter: Secondary | ICD-10-CM | POA: Diagnosis not present

## 2017-06-02 DIAGNOSIS — M9905 Segmental and somatic dysfunction of pelvic region: Secondary | ICD-10-CM | POA: Diagnosis not present

## 2017-06-02 DIAGNOSIS — M9903 Segmental and somatic dysfunction of lumbar region: Secondary | ICD-10-CM | POA: Diagnosis not present

## 2017-06-02 DIAGNOSIS — S29012A Strain of muscle and tendon of back wall of thorax, initial encounter: Secondary | ICD-10-CM | POA: Diagnosis not present

## 2017-06-02 DIAGNOSIS — M9902 Segmental and somatic dysfunction of thoracic region: Secondary | ICD-10-CM | POA: Diagnosis not present

## 2017-06-02 DIAGNOSIS — S39012A Strain of muscle, fascia and tendon of lower back, initial encounter: Secondary | ICD-10-CM | POA: Diagnosis not present

## 2017-06-09 DIAGNOSIS — H353112 Nonexudative age-related macular degeneration, right eye, intermediate dry stage: Secondary | ICD-10-CM | POA: Diagnosis not present

## 2017-06-16 DIAGNOSIS — D229 Melanocytic nevi, unspecified: Secondary | ICD-10-CM | POA: Diagnosis not present

## 2017-06-16 DIAGNOSIS — L821 Other seborrheic keratosis: Secondary | ICD-10-CM | POA: Diagnosis not present

## 2017-06-16 DIAGNOSIS — L820 Inflamed seborrheic keratosis: Secondary | ICD-10-CM | POA: Diagnosis not present

## 2017-06-16 DIAGNOSIS — L814 Other melanin hyperpigmentation: Secondary | ICD-10-CM | POA: Diagnosis not present

## 2017-06-23 ENCOUNTER — Other Ambulatory Visit (INDEPENDENT_AMBULATORY_CARE_PROVIDER_SITE_OTHER): Payer: Medicare Other

## 2017-06-23 DIAGNOSIS — R7989 Other specified abnormal findings of blood chemistry: Secondary | ICD-10-CM | POA: Diagnosis not present

## 2017-06-23 DIAGNOSIS — R7303 Prediabetes: Secondary | ICD-10-CM

## 2017-06-23 LAB — BASIC METABOLIC PANEL
BUN: 26 mg/dL — ABNORMAL HIGH (ref 6–23)
CO2: 24 mEq/L (ref 19–32)
Calcium: 9.9 mg/dL (ref 8.4–10.5)
Chloride: 100 mEq/L (ref 96–112)
Creatinine, Ser: 0.69 mg/dL (ref 0.40–1.20)
GFR: 88.3 mL/min (ref >60.00)
Glucose, Bld: 122 mg/dL — ABNORMAL HIGH (ref 70–99)
Potassium: 4.1 mEq/L (ref 3.5–5.1)
Sodium: 132 mEq/L — ABNORMAL LOW (ref 135–145)

## 2017-06-23 LAB — TSH: TSH: 4.22 u[IU]/mL (ref 0.35–4.50)

## 2017-06-23 LAB — HEMOGLOBIN A1C: Hgb A1c MFr Bld: 6.1 % (ref 4.6–6.5)

## 2017-06-26 ENCOUNTER — Ambulatory Visit: Payer: Medicare Other | Admitting: Endocrinology

## 2017-06-26 ENCOUNTER — Ambulatory Visit (INDEPENDENT_AMBULATORY_CARE_PROVIDER_SITE_OTHER): Payer: Medicare Other | Admitting: Endocrinology

## 2017-06-26 ENCOUNTER — Encounter: Payer: Self-pay | Admitting: Endocrinology

## 2017-06-26 VITALS — BP 122/64 | HR 74 | Ht 61.0 in | Wt 133.0 lb

## 2017-06-26 DIAGNOSIS — I10 Essential (primary) hypertension: Secondary | ICD-10-CM | POA: Diagnosis not present

## 2017-06-26 DIAGNOSIS — R7303 Prediabetes: Secondary | ICD-10-CM

## 2017-06-26 DIAGNOSIS — E871 Hypo-osmolality and hyponatremia: Secondary | ICD-10-CM | POA: Diagnosis not present

## 2017-06-26 DIAGNOSIS — Z1389 Encounter for screening for other disorder: Secondary | ICD-10-CM | POA: Diagnosis not present

## 2017-06-26 DIAGNOSIS — Z0000 Encounter for general adult medical examination without abnormal findings: Secondary | ICD-10-CM | POA: Diagnosis not present

## 2017-06-26 DIAGNOSIS — Z79899 Other long term (current) drug therapy: Secondary | ICD-10-CM | POA: Diagnosis not present

## 2017-06-26 DIAGNOSIS — E782 Mixed hyperlipidemia: Secondary | ICD-10-CM | POA: Diagnosis not present

## 2017-06-26 NOTE — Progress Notes (Signed)
Patient ID: Brittany Figueroa, female   DOB: May 04, 1941, 77 y.o.   MRN: [id number]           Chief complaint:  Follow-up of prediabetes  History of Present Illness:  She was told by her PCP that she has type 2 diabetes based on an A1c of 6.5% in 07/2013 She has not had any other A1c is subsequently above normal, normal range and previous lab 6.2 or less She also has not had any abnormal blood sugars except a glucose of 125 in 3/16  She does have increased fasting blood sugars ranging from 108-125 since about 2012  She may have been told about 10 years ago that she had prediabetes and reportedly had a normal glucose tolerance test.  GLUCOSE TOLERANCE test on 05/23/15: Fasting glucose 110, two-hour glucose 192  RECENT history:   With her A1c of 6.2 she was restarted on metformin ER in 4/17 She was taking 1500 mg a day but could not tolerate this because of feeling of bloating  Subsequently in 1/18 when A1c was 6.5 and glucose 133 fasting she was told to start back on metformin and also improve her diet Now taking 500 mg twice a day with minimal bloating as side effect  A1c is the about same now at 6.1 %   Home blood sugars as below She has FASTING blood sugars around 10 on average She has only a few readings around lunch and suppertime and these are fairly good with highest reading 190 after supper but usually not taking at night She thinks she is trying to watch her diet on her own and continues to be losing a little weight She tries to exercise when she can and is trying to be as active at times at the [facility] readings using the FreeStyle   Mean values apply above for all meters except median for One Touch  PRE-MEAL Fasting Lunch Dinner Bedtime Overall  Glucose range:  97-118  97-149     Mean/median:  109    117   POST-MEAL PC Breakfast PC Lunch PC Dinner  Glucose range:    93, 190  Mean/median:      Previous readings:  Mean values apply above for all meters except median for  One Touch  PRE-MEAL Fasting Lunch Dinner Bedtime Overall  Glucose range: 89-1 28    101-150    Mean/median: 104    129  106     Weight history: Previous range 139-143  Wt Readings from Last 3 Encounters:  06/26/17 133 lb (60.3 kg)  02/20/17 135 lb (61.2 kg)  10/21/16 136 lb 3.2 oz (61.8 kg)    Lab Results  Component Value Date   HGBA1C 6.1 06/23/2017   HGBA1C 6.0 02/17/2017   HGBA1C 6.0 10/16/2016   Lab Results  Component Value Date   LDLCALC 93 02/17/2017   CREATININE 0.69 06/23/2017     Lab on 06/23/2017  Component Date Value Ref Range Status  . TSH 06/23/2017 4.22  0.35 - 4.50 uIU/mL Final  . Sodium 06/23/2017 132* 135 - 145 mEq/L Final  . Potassium 06/23/2017 4.1  3.5 - 5.1 mEq/L Final  . Chloride 06/23/2017 100  96 - 112 mEq/L Final  . CO2 06/23/2017 24  19 - 32 mEq/L Final  . Glucose, Bld 06/23/2017 122* 70 - 99 mg/dL Final  . BUN 06/23/2017 26* 6 - 23 mg/dL Final  . Creatinine, Ser 06/23/2017 0.69  0.40 - 1.20 mg/dL Final  . Calcium  06/23/2017 9.9  8.4 - 10.5 mg/dL Final  . GFR 06/23/2017 88.30  >60.00 mL/min Final  . Hgb A1c MFr Bld 06/23/2017 6.1  4.6 - 6.5 % Final   Glycemic Control Guidelines for People with Diabetes:Non Diabetic:  <6%Goal of Therapy: <7%Additional Action Suggested:  >8%       Past Medical History:  Diagnosis Date  . Abnormal finding on EKG, new anterolateral T-wave inversions  05/14/2012  . Abnormal nuclear stress test, with infero-lateral ischemia 05/14/2012  . Arthritis    "right thumb; all my fingers" (05/13/2012), cervical spondylosis   . Atypical angina ([place]) 05/14/2012  . CAD (coronary artery disease), 05/13/12, with 80% LAD 05/14/2012  . Cancer ([place])    basal cell on facial in L temporal region    . [facility]MC arthritis, thumb, degenerative   . Complication of anesthesia 12/2009   "had endoscopy; larynx went into spasms; stopped breathing for 15 seconds" (05/13/2012)  . GERD (gastroesophageal reflux disease) 2011  .  Hypercholesteremia   . Hypertension   . S/P angioplasty with stent, LAD 05/13/12 05/14/2012  . Tuberculosis    test +- [place] [facility] - Dr. [name]- CXR- OK    Past Surgical History:  Procedure Laterality Date  . ANTERIOR CERVICAL DECOMP/DISCECTOMY FUSION N/A 12/13/2013   Procedure: ANTERIOR CERVICAL DECOMPRESSION/DISCECTOMY FUSION 3 LEVELS Cervical four/five,five/six,six/seven. Anterior cervical disectomy and fusion with peek and plate ;  Surgeon: [name], MD;  Location: [facility];  Service: Neurosurgery;  Laterality: N/A;  . CARDIAC CATHETERIZATION N/A 07/02/2015   Procedure: Left Heart Cath and Coronary Angiography;  Surgeon: [name], MD;  Location: [facility];  Service: Cardiovascular;  Laterality: N/A;  . CORONARY ANGIOPLASTY WITH STENT PLACEMENT  05/13/2012   DES to LAD; she has total RCA with left to rt coll. normal LV function done for positive nuc study  . DILATION AND CURETTAGE OF UTERUS  1960's; 1970's; 1980   "probably 3" (05/13/2012)  . FOREARM / WRIST TENDON LESION EXCISION  ?11/2001   "right; did waver to try to get ulnar out of hole that it had cut" (05/13/2012  . INGUINAL HERNIA REPAIR  ~ 1966; 07/21/2002   "right; left" (05/13/2012)  . LEFT HEART CATHETERIZATION WITH CORONARY ANGIOGRAM N/A 05/13/2012   Procedure: LEFT HEART CATHETERIZATION WITH CORONARY ANGIOGRAM;  Surgeon: [name], MD;  Location: [facility] CATH LAB;  Service: Cardiovascular;  Laterality: N/A;  . OSTEOTOMY AND ULNAR SHORTENING  07/21/2002   "right" (05/13/2012)  . TONSILLECTOMY AND ADENOIDECTOMY  1950's  . TUBAL LIGATION  1980    Family History  Problem Relation Age of Onset  . Hypertension Mother   . Diabetes Mother   . Hyperlipidemia Father   . Heart disease Father   . Stroke Father   . Heart disease Sister   . Diabetes Sister   . Heart disease Brother   . Diabetes Maternal Aunt     Social History:  reports that she quit smoking about 28 years ago. Her smoking use included  cigarettes. She has a 10.00 pack-year smoking history. she has never used smokeless tobacco. She reports that she drinks about 7.2 oz of alcohol per week. She reports that she does not use drugs.  Allergies:  Allergies  Allergen Reactions  . Statins Other (See Comments)    "intermittent loss of circulation; hands and arms will go to sleep; feet will get cramps; fatigue" (05/13/2012).  This occurred with Lipitor, Zocor, Crestor 5 mg qd, and she thinks pravastatin  as well  . Amlodipine Itching  . Gadolinium Derivatives Itching and Nausea Only    Pt had severe nausea and itching on legs, neck and back, per dr [name] pt needs 13 hour prep before contrast in the future  . Metformin And Related Other (See Comments)    Abdominal bloating    Allergies as of 06/26/2017      Reactions   Statins Other (See Comments)   "intermittent loss of circulation; hands and arms will go to sleep; feet will get cramps; fatigue" (05/13/2012).  This occurred with Lipitor, Zocor, Crestor 5 mg qd, and she thinks pravastatin as well   Amlodipine Itching   Gadolinium Derivatives Itching, Nausea Only   Pt had severe nausea and itching on legs, neck and back, per dr [name] pt needs 13 hour prep before contrast in the future   Metformin And Related Other (See Comments)   Abdominal bloating      Medication List        Accurate as of 06/26/17 11:01 AM. Always use your most recent med list.          acetaminophen 500 MG tablet Commonly known as:  TYLENOL Take 500 mg by mouth every 6 (six) hours as needed for mild pain or headache.   aspirin 81 MG tablet Take 81 mg by mouth daily.   atorvastatin 10 MG tablet Commonly known as:  LIPITOR Take 1 tablet (10 mg total) by mouth daily.   calcium-vitamin D 500-200 MG-UNIT tablet Commonly known as:  OSCAL WITH D Take 2 tablets by mouth daily.   carvedilol 6.25 MG tablet Commonly known as:  COREG TAKE 1 TABLET BY MOUTH TWICE DAILY WITH FOOD   chlorthalidone 25 MG  tablet Commonly known as:  HYGROTON TAKE 1/2 TABLET(12.5 MG) BY MOUTH DAILY   clopidogrel 75 MG tablet Commonly known as:  PLAVIX TAKE 1 TABLET BY MOUTH EVERY DAY   diclofenac sodium 1 % Gel Commonly known as:  VOLTAREN Apply 2 g topically daily as needed. For pain   ezetimibe 10 MG tablet Commonly known as:  ZETIA TAKE 1 TABLET BY MOUTH EVERY DAY   fenofibrate 160 MG tablet Take 1 tablet (160 mg total) by mouth daily.   glucose blood test strip Commonly known as:  ONETOUCH VERIO Use to test blood sugar once daily Dx code E11.9   hydrocortisone valerate cream 0.2 % Commonly known as:  WESTCORT Apply 1 application topically daily as needed (ITCHING).   irbesartan 300 MG tablet Commonly known as:  AVAPRO TAKE 1 TABLET(300 MG) BY MOUTH DAILY   irbesartan 150 MG tablet Commonly known as:  AVAPRO TAKE 1 TABLET BY MOUTH TWICE DAILY   metFORMIN 500 MG 24 hr tablet Commonly known as:  GLUCOPHAGE-XR TAKE 1 TABLET BY MOUTH DAILY WITH FOOD FOR 1 WEEK, THEN INCREASE TO 2 TABLETS DAILY WITH FOOD   metFORMIN 500 MG 24 hr tablet Commonly known as:  GLUCOPHAGE-XR Take 2 tablets (1,000 mg total) by mouth daily with supper.   multivitamin with minerals Tabs tablet Take 1 tablet by mouth daily.   nitroGLYCERIN 0.4 MG SL tablet Commonly known as:  NITROSTAT PLACE 1 TABLET UNDER THE TONGUE EVERY 5 MINUTES AS NEEDED FOR CHEST PAIN   Vitamin D3 2000 units Tabs Take 2,000 Units by mouth 2 (two) times daily.       LABS:  Lab on 06/23/2017  Component Date Value Ref Range Status  . TSH 06/23/2017 4.22  0.35 - 4.50 uIU/mL Final  .  Sodium 06/23/2017 132* 135 - 145 mEq/L Final  . Potassium 06/23/2017 4.1  3.5 - 5.1 mEq/L Final  . Chloride 06/23/2017 100  96 - 112 mEq/L Final  . CO2 06/23/2017 24  19 - 32 mEq/L Final  . Glucose, Bld 06/23/2017 122* 70 - 99 mg/dL Final  . BUN 06/23/2017 26* 6 - 23 mg/dL Final  . Creatinine, Ser 06/23/2017 0.69  0.40 - 1.20 mg/dL Final  . Calcium  06/23/2017 9.9  8.4 - 10.5 mg/dL Final  . GFR 06/23/2017 88.30  >60.00 mL/min Final  . Hgb A1c MFr Bld 06/23/2017 6.1  4.6 - 6.5 % Final   Glycemic Control Guidelines for People with Diabetes:Non Diabetic:  <6%Goal of Therapy: <7%Additional Action Suggested:  >8%       Review of Systems  Other active medical issues followed by various physicians:  Hypertension, treated with Avapro, chlorthalidone half tablet and Coreg, Followed by PCP  History of hyperlipidemia with some intolerance to statins, on Fenofibrate, 10 mg Lipitor and also on Zetia.  This is followed by cardiologist in the lipid clinic  Last LDL 93  Lab Results  Component Value Date   CHOL 161 02/17/2017   HDL 42.40 02/17/2017   LDLCALC 93 02/17/2017   LDLDIRECT 130.1 04/04/2014   TRIG 127.0 02/17/2017   CHOLHDL 4 02/17/2017   HYPONATREMIA: This is related to her chlorthalidone  Lab Results  Component Value Date   CREATININE 0.69 06/23/2017   BUN 26 (H) 06/23/2017   NA 132 (L) 06/23/2017   K 4.1 06/23/2017   CL 100 06/23/2017   CO2 24 06/23/2017     ?  HYPOTHYROIDISM: TSH 4.55 in 8/17 and subsequently 5.6 This is back to normal consistently without any treatment No symptoms of unusual fatigue  Lab Results  Component Value Date   TSH 4.22 06/23/2017   TSH 3.84 07/18/2016   TSH 5.59 (H) 06/03/2016   FREET4 1.03 06/03/2016      PHYSICAL EXAM:  BP 122/64 (BP Location: Left Arm, Patient Position: Sitting, Cuff Size: Normal)   Pulse 74   Ht 5\' 1"  (1.549 m)   Wt 133 lb (60.3 kg)   SpO2 95%   BMI 25.13 kg/m    Exam not indicated   ASSESSMENT:   Prediabetes with impaired fasting glucose and also impaired glucose tolerance Highest A1c Previously 6.5 .   Previously highest glucose overall 125 only once in the lab  She is on metformin 500 mg twice daily With this then will also generally good diet and periodic efforts to exercise she has fairly good control Fasting readings continue to be mildly  increased but stable  Considering her age she is doing well, has had prediabetes for several years  HYPERTENSION : Well-controlled but she does appear to have mild hyponatremia from chlorthalidone, will defer any management to PCP  High normal TSH: Stable   PLAN:   Continue metformin unchanged She has lost weight and was encouraged to continue her efforts to do so She does continue to monitor blood sugars at home especially fasting and she will let us know if there is any significant change since  Follow-up in 4 months again  She will follow-up with other physicians regarding this lipids, hypertension and hyponatremia     Abnormal TSH: More consistently normal now and does not need any supplementation    Elayne Snare 06/26/2017, 11:01 AM

## 2017-06-27 DIAGNOSIS — E871 Hypo-osmolality and hyponatremia: Secondary | ICD-10-CM | POA: Insufficient documentation

## 2017-06-30 DIAGNOSIS — M9903 Segmental and somatic dysfunction of lumbar region: Secondary | ICD-10-CM | POA: Diagnosis not present

## 2017-06-30 DIAGNOSIS — S39012A Strain of muscle, fascia and tendon of lower back, initial encounter: Secondary | ICD-10-CM | POA: Diagnosis not present

## 2017-06-30 DIAGNOSIS — M9905 Segmental and somatic dysfunction of pelvic region: Secondary | ICD-10-CM | POA: Diagnosis not present

## 2017-06-30 DIAGNOSIS — M9902 Segmental and somatic dysfunction of thoracic region: Secondary | ICD-10-CM | POA: Diagnosis not present

## 2017-06-30 DIAGNOSIS — S338XXA Sprain of other parts of lumbar spine and pelvis, initial encounter: Secondary | ICD-10-CM | POA: Diagnosis not present

## 2017-06-30 DIAGNOSIS — S29012A Strain of muscle and tendon of back wall of thorax, initial encounter: Secondary | ICD-10-CM | POA: Diagnosis not present

## 2017-07-09 ENCOUNTER — Other Ambulatory Visit: Payer: Self-pay | Admitting: Cardiovascular Disease

## 2017-07-09 ENCOUNTER — Other Ambulatory Visit: Payer: Self-pay | Admitting: Endocrinology

## 2017-07-09 DIAGNOSIS — H353112 Nonexudative age-related macular degeneration, right eye, intermediate dry stage: Secondary | ICD-10-CM | POA: Diagnosis not present

## 2017-07-09 NOTE — Telephone Encounter (Signed)
REFILL 

## 2017-08-08 DIAGNOSIS — H353112 Nonexudative age-related macular degeneration, right eye, intermediate dry stage: Secondary | ICD-10-CM | POA: Diagnosis not present

## 2017-08-10 DIAGNOSIS — S29012A Strain of muscle and tendon of back wall of thorax, initial encounter: Secondary | ICD-10-CM | POA: Diagnosis not present

## 2017-08-10 DIAGNOSIS — M9903 Segmental and somatic dysfunction of lumbar region: Secondary | ICD-10-CM | POA: Diagnosis not present

## 2017-08-10 DIAGNOSIS — M9905 Segmental and somatic dysfunction of pelvic region: Secondary | ICD-10-CM | POA: Diagnosis not present

## 2017-08-10 DIAGNOSIS — S39012A Strain of muscle, fascia and tendon of lower back, initial encounter: Secondary | ICD-10-CM | POA: Diagnosis not present

## 2017-08-10 DIAGNOSIS — M9902 Segmental and somatic dysfunction of thoracic region: Secondary | ICD-10-CM | POA: Diagnosis not present

## 2017-08-10 DIAGNOSIS — S338XXA Sprain of other parts of lumbar spine and pelvis, initial encounter: Secondary | ICD-10-CM | POA: Diagnosis not present

## 2017-08-21 ENCOUNTER — Other Ambulatory Visit: Payer: Self-pay | Admitting: Cardiovascular Disease

## 2017-08-21 NOTE — Telephone Encounter (Signed)
Rx has been sent to the pharmacy electronically. ° °

## 2017-08-27 DIAGNOSIS — H353121 Nonexudative age-related macular degeneration, left eye, early dry stage: Secondary | ICD-10-CM | POA: Diagnosis not present

## 2017-08-27 DIAGNOSIS — H10413 Chronic giant papillary conjunctivitis, bilateral: Secondary | ICD-10-CM | POA: Diagnosis not present

## 2017-08-27 DIAGNOSIS — M7061 Trochanteric bursitis, right hip: Secondary | ICD-10-CM | POA: Diagnosis not present

## 2017-08-27 DIAGNOSIS — H353111 Nonexudative age-related macular degeneration, right eye, early dry stage: Secondary | ICD-10-CM | POA: Diagnosis not present

## 2017-09-07 DIAGNOSIS — H353112 Nonexudative age-related macular degeneration, right eye, intermediate dry stage: Secondary | ICD-10-CM | POA: Diagnosis not present

## 2017-09-08 ENCOUNTER — Other Ambulatory Visit: Payer: Self-pay | Admitting: Cardiovascular Disease

## 2017-09-08 NOTE — Telephone Encounter (Signed)
REFILL 

## 2017-09-12 ENCOUNTER — Other Ambulatory Visit: Payer: Self-pay | Admitting: Cardiovascular Disease

## 2017-09-14 DIAGNOSIS — S39012A Strain of muscle, fascia and tendon of lower back, initial encounter: Secondary | ICD-10-CM | POA: Diagnosis not present

## 2017-09-14 DIAGNOSIS — M9905 Segmental and somatic dysfunction of pelvic region: Secondary | ICD-10-CM | POA: Diagnosis not present

## 2017-09-14 DIAGNOSIS — M9903 Segmental and somatic dysfunction of lumbar region: Secondary | ICD-10-CM | POA: Diagnosis not present

## 2017-09-14 DIAGNOSIS — M9902 Segmental and somatic dysfunction of thoracic region: Secondary | ICD-10-CM | POA: Diagnosis not present

## 2017-09-14 DIAGNOSIS — S338XXA Sprain of other parts of lumbar spine and pelvis, initial encounter: Secondary | ICD-10-CM | POA: Diagnosis not present

## 2017-09-14 DIAGNOSIS — S29012A Strain of muscle and tendon of back wall of thorax, initial encounter: Secondary | ICD-10-CM | POA: Diagnosis not present

## 2017-09-14 NOTE — Telephone Encounter (Signed)
Rx sent to pharmacy   

## 2017-09-15 ENCOUNTER — Other Ambulatory Visit: Payer: Self-pay | Admitting: Cardiovascular Disease

## 2017-09-15 NOTE — Telephone Encounter (Signed)
Rx sent to pharmacy   

## 2017-09-16 DIAGNOSIS — S39012A Strain of muscle, fascia and tendon of lower back, initial encounter: Secondary | ICD-10-CM | POA: Diagnosis not present

## 2017-09-16 DIAGNOSIS — S338XXA Sprain of other parts of lumbar spine and pelvis, initial encounter: Secondary | ICD-10-CM | POA: Diagnosis not present

## 2017-09-16 DIAGNOSIS — M9902 Segmental and somatic dysfunction of thoracic region: Secondary | ICD-10-CM | POA: Diagnosis not present

## 2017-09-16 DIAGNOSIS — S29012A Strain of muscle and tendon of back wall of thorax, initial encounter: Secondary | ICD-10-CM | POA: Diagnosis not present

## 2017-09-16 DIAGNOSIS — M9905 Segmental and somatic dysfunction of pelvic region: Secondary | ICD-10-CM | POA: Diagnosis not present

## 2017-09-16 DIAGNOSIS — M9903 Segmental and somatic dysfunction of lumbar region: Secondary | ICD-10-CM | POA: Diagnosis not present

## 2017-09-21 DIAGNOSIS — M9903 Segmental and somatic dysfunction of lumbar region: Secondary | ICD-10-CM | POA: Diagnosis not present

## 2017-09-21 DIAGNOSIS — S338XXA Sprain of other parts of lumbar spine and pelvis, initial encounter: Secondary | ICD-10-CM | POA: Diagnosis not present

## 2017-09-21 DIAGNOSIS — M9905 Segmental and somatic dysfunction of pelvic region: Secondary | ICD-10-CM | POA: Diagnosis not present

## 2017-09-21 DIAGNOSIS — S29012A Strain of muscle and tendon of back wall of thorax, initial encounter: Secondary | ICD-10-CM | POA: Diagnosis not present

## 2017-09-21 DIAGNOSIS — M9902 Segmental and somatic dysfunction of thoracic region: Secondary | ICD-10-CM | POA: Diagnosis not present

## 2017-09-21 DIAGNOSIS — S39012A Strain of muscle, fascia and tendon of lower back, initial encounter: Secondary | ICD-10-CM | POA: Diagnosis not present

## 2017-09-24 DIAGNOSIS — S29012A Strain of muscle and tendon of back wall of thorax, initial encounter: Secondary | ICD-10-CM | POA: Diagnosis not present

## 2017-09-24 DIAGNOSIS — S338XXA Sprain of other parts of lumbar spine and pelvis, initial encounter: Secondary | ICD-10-CM | POA: Diagnosis not present

## 2017-09-24 DIAGNOSIS — S39012A Strain of muscle, fascia and tendon of lower back, initial encounter: Secondary | ICD-10-CM | POA: Diagnosis not present

## 2017-09-24 DIAGNOSIS — M9903 Segmental and somatic dysfunction of lumbar region: Secondary | ICD-10-CM | POA: Diagnosis not present

## 2017-09-24 DIAGNOSIS — M9905 Segmental and somatic dysfunction of pelvic region: Secondary | ICD-10-CM | POA: Diagnosis not present

## 2017-09-24 DIAGNOSIS — M9902 Segmental and somatic dysfunction of thoracic region: Secondary | ICD-10-CM | POA: Diagnosis not present

## 2017-09-29 ENCOUNTER — Encounter: Payer: Self-pay | Admitting: Cardiovascular Disease

## 2017-09-29 ENCOUNTER — Ambulatory Visit (INDEPENDENT_AMBULATORY_CARE_PROVIDER_SITE_OTHER): Payer: Medicare Other | Admitting: Cardiovascular Disease

## 2017-09-29 VITALS — BP 139/69 | HR 65 | Ht 61.0 in | Wt 132.0 lb

## 2017-09-29 DIAGNOSIS — E785 Hyperlipidemia, unspecified: Secondary | ICD-10-CM | POA: Diagnosis not present

## 2017-09-29 DIAGNOSIS — Z0000 Encounter for general adult medical examination without abnormal findings: Secondary | ICD-10-CM | POA: Diagnosis not present

## 2017-09-29 DIAGNOSIS — I2510 Atherosclerotic heart disease of native coronary artery without angina pectoris: Secondary | ICD-10-CM | POA: Diagnosis not present

## 2017-09-29 DIAGNOSIS — I10 Essential (primary) hypertension: Secondary | ICD-10-CM

## 2017-09-29 NOTE — Progress Notes (Signed)
09/29/2017 Brittany Figueroa   01/28/1941  [id number]  Primary Physician Lajean Manes, MD Primary Cardiologist: [name] MD FACP, [place], [place], [place]  HPI:  Brittany Figueroa is a 77 y.o.  mildly overweight divorced Caucasian female, mother to 2, grandmother to 3 grandchildren, who I last saw  07/25/2016. She has a history of hypertension and hyperlipidemia. I saw her in December of 2013 with new anterolateral T-wave inversion and a Myoview that showed inferolateral ischemia that was new compared to a prior study. She did have some unusual symptoms at that time. Based on this, I catheterized her on May 13, 2012, revealing an 80% proximal LAD lesion, which I stented using a drug-eluting stent, as well as a total dominant RCA with left to right collaterals and normal LV function. Since stenting her LAD, she is ultimately asymptomatic. Her other problems include hypertension and hyperlipidemia. I have reviewed her blood pressures, which were recorded during cardiac rehab, which were all normal. Her recent blood work revealed a total cholesterol of 161, LDL of 89 and HDL of 40.she denies chest pain or shortness of breath. She had a recent Myoview stress test performed several months ago that showed ischemia in the RCA territory but otherwise unremarkable is explainable by her known total dominant RCA with left to right collaterals. Chief complaint of excruciating neck pain thought to be related to cervical disc disease. She was evaluated by Dr. Because of her neck pain demonstrated cervical disc disease requiring fusion. Because of this she will need to undergo pharmacologic Myoview stress testing to risk stratify her. She underwent cervical discectomy by Dr. Deri Fuelling with excellent clinical result. She no longer is in pain. Her major issue now is treatment of her hyperlipidemia. She is statin intolerant and complains of short-term memory loss. Ultimately, I think she will require P CSK9 monoclonal  injectable therapy although apparently her insurance will not cover enough to make it financially feasible. She saw Rosaria Ferries in the office approximately 2 weeks ago with increasing dyspnea on exertion. A Myoview stress test performed 06/15/15 showed significant ischemia in the RCA territory. This is new compared to her previous Myoview performed in 2015 and suggest the possibility of in-stent restenosis and/or a denovo lesion in the LAD territory. I performed coronary angiography on her 07/02/15 via the right radial approach revealing unchanged anatomy compared to her prior cath 4 years ago. Her proximal LAD stent was patent. Her 80% diagonal branch stenosis was unchanged and her RCA was dominant, occluded proximally with left-to-right collaterals with normal LV function. Since I saw her a year ago she's remained clinically stable. She works out 4-5 days a week for 35 minutes at a time. She is completely asymptomatic.  She has developed macular degeneration since I saw her last.     Current Meds  Medication Sig  . acetaminophen (TYLENOL) 500 MG tablet Take 500 mg by mouth every 6 (six) hours as needed for mild pain or headache.  Marland Kitchen aspirin 81 MG tablet Take 81 mg by mouth daily.  Marland Kitchen atorvastatin (LIPITOR) 10 MG tablet TAKE 1 TABLET(10 MG) BY MOUTH DAILY  . calcium-vitamin D (OSCAL WITH D) 500-200 MG-UNIT per tablet Take 2 tablets by mouth daily.  . carvedilol (COREG) 6.25 MG tablet TAKE 1 TABLET BY MOUTH TWICE DAILY WITH FOOD  . chlorthalidone (HYGROTON) 25 MG tablet TAKE 1/2 TABLET(12.5 MG) BY MOUTH DAILY  . Cholecalciferol (VITAMIN D3) 2000 UNITS TABS Take 2,000 Units by mouth 2 (  two) times daily.  . clopidogrel (PLAVIX) 75 MG tablet Take 1 tablet (75 mg total) by mouth daily. NEED OV.  Marland Kitchen diclofenac sodium (VOLTAREN) 1 % GEL Apply 2 g topically daily as needed. For pain  . ezetimibe (ZETIA) 10 MG tablet TAKE 1 TABLET(10 MG) BY MOUTH DAILY  . fenofibrate 160 MG tablet TAKE 1 TABLET(160 MG) BY  MOUTH DAILY  . glucose blood (ONETOUCH VERIO) test strip Use to test blood sugar once daily Dx code E11.9  . hydrocortisone valerate cream (WESTCORT) 0.2 % Apply 1 application topically daily as needed (ITCHING).  Marland Kitchen irbesartan (AVAPRO) 150 MG tablet TAKE 1 TABLET BY MOUTH TWICE DAILY  . irbesartan (AVAPRO) 300 MG tablet TAKE 1 TABLET(300 MG) BY MOUTH DAILY  . metFORMIN (GLUCOPHAGE-XR) 500 MG 24 hr tablet TAKE 2 TABLETS(1000 MG) BY MOUTH DAILY WITH SUPPER  . Multiple Vitamin (MULTIVITAMIN WITH MINERALS) TABS Take 1 tablet by mouth daily.  . nitroGLYCERIN (NITROSTAT) 0.4 MG SL tablet PLACE 1 TABLET UNDER THE TONGUE EVERY 5 MINUTES AS NEEDED FOR CHEST PAIN     Allergies  Allergen Reactions  . Statins Other (See Comments)    "intermittent loss of circulation; hands and arms will go to sleep; feet will get cramps; fatigue" (05/13/2012).  This occurred with Lipitor, Zocor, Crestor 5 mg qd, and she thinks pravastatin as well  . Amlodipine Itching  . Gadolinium Derivatives Itching and Nausea Only    Pt had severe nausea and itching on legs, neck and back, per dr [name] pt needs 13 hour prep before contrast in the future  . Metformin And Related Other (See Comments)    Abdominal bloating    Social History   Socioeconomic History  . Marital status: Divorced    Spouse name: Not on file  . Number of children: Not on file  . Years of education: Not on file  . Highest education level: Not on file  Occupational History  . Not on file  Social Needs  . Financial resource strain: Not on file  . Food insecurity:    Worry: Not on file    Inability: Not on file  . Transportation needs:    Medical: Not on file    Non-medical: Not on file  Tobacco Use  . Smoking status: Former Smoker    Packs/day: 0.50    Years: 20.00    Pack years: 10.00    Types: Cigarettes    Last attempt to quit: 07/05/1988    Years since quitting: 29.2  . Smokeless tobacco: Never Used  Substance and Sexual Activity  .  Alcohol use: Yes    Alcohol/week: 7.2 oz    Types: 12 Glasses of wine per week    Comment: 05/13/2012 "6-8 oz glass of red wine q hs"   . Drug use: No  . Sexual activity: Never  Lifestyle  . Physical activity:    Days per week: Not on file    Minutes per session: Not on file  . Stress: Not on file  Relationships  . Social connections:    Talks on phone: Not on file    Gets together: Not on file    Attends religious service: Not on file    Active member of club or organization: Not on file    Attends meetings of clubs or organizations: Not on file    Relationship status: Not on file  . Intimate partner violence:    Fear of current or ex partner: Not on file  Emotionally abused: Not on file    Physically abused: Not on file    Forced sexual activity: Not on file  Other Topics Concern  . Not on file  Social History Narrative  . Not on file     Review of Systems: General: negative for chills, fever, night sweats or weight changes.  Cardiovascular: negative for chest pain, dyspnea on exertion, edema, orthopnea, palpitations, paroxysmal nocturnal dyspnea or shortness of breath Dermatological: negative for rash Respiratory: negative for cough or wheezing Urologic: negative for hematuria Abdominal: negative for nausea, vomiting, diarrhea, bright red blood per rectum, melena, or hematemesis Neurologic: negative for visual changes, syncope, or dizziness All other systems reviewed and are otherwise negative except as noted above.    Blood pressure 139/69, pulse 65, height 5\' 1"  (1.549 m), weight 132 lb (59.9 kg).  General appearance: alert and no distress Neck: no adenopathy, no carotid bruit, no JVD, supple, symmetrical, trachea midline and thyroid not enlarged, symmetric, no tenderness/mass/nodules Lungs: clear to auscultation bilaterally Heart: regular rate and rhythm, S1, S2 normal, no murmur, click, rub or gallop Extremities: extremities normal, atraumatic, no cyanosis or  edema Pulses: 2+ and symmetric Skin: Skin color, texture, turgor normal. No rashes or lesions Neurologic: Alert and oriented X 3, normal strength and tone. Normal symmetric reflexes. Normal coordination and gait  EKG sinus rhythm at 65 with left anterior fascicular block.  I personally reviewed this EKG.  ASSESSMENT AND PLAN:   CAD- residual total RCA and Dx disease History of CAD status post proximal LAD stenting by myself 05/13/2012 with a drug-eluting stent.  She did have a total RCA with left-to-right collaterals are normal LV function.  I recatheterized her after an abnormal Myoview 07/02/2015 via the right radial approach revealing unchanged anatomy with a widely patent LAD stent.  She is totally symptomatic and exercises frequently.  Dyslipidemia History of dyslipidemia on statin therapy, fenofibrate and Zetia with lipid profile performed 02/17/2017 revealing total cholesterol 161, LDL 93 and HDL of 42.  There is mention of statin intolerance.  Essential hypertension History of essential potential blood pressure measured today at 139/69.  She is on carvedilol Thalitone Avapro.  Continue current meds at current dosing.      [name] MD FACP,FACC,FAHA, [facility] 09/29/2017 9:40 AM

## 2017-09-29 NOTE — Patient Instructions (Signed)

## 2017-09-29 NOTE — Assessment & Plan Note (Signed)
History of essential potential blood pressure measured today at 139/69.  She is on carvedilol Thalitone Avapro.  Continue current meds at current dosing.

## 2017-09-29 NOTE — Assessment & Plan Note (Addendum)
History of dyslipidemia on statin therapy, fenofibrate and Zetia with lipid profile performed 02/17/2017 revealing total cholesterol 161, LDL 93 and HDL of 42.  There is mention of statin intolerance.

## 2017-09-29 NOTE — Assessment & Plan Note (Signed)
History of CAD status post proximal LAD stenting by myself 05/13/2012 with a drug-eluting stent.  She did have a total RCA with left-to-right collaterals are normal LV function.  I recatheterized her after an abnormal Myoview 07/02/2015 via the right radial approach revealing unchanged anatomy with a widely patent LAD stent.  She is totally symptomatic and exercises frequently.

## 2017-10-01 DIAGNOSIS — M9905 Segmental and somatic dysfunction of pelvic region: Secondary | ICD-10-CM | POA: Diagnosis not present

## 2017-10-01 DIAGNOSIS — M9903 Segmental and somatic dysfunction of lumbar region: Secondary | ICD-10-CM | POA: Diagnosis not present

## 2017-10-01 DIAGNOSIS — S29012A Strain of muscle and tendon of back wall of thorax, initial encounter: Secondary | ICD-10-CM | POA: Diagnosis not present

## 2017-10-01 DIAGNOSIS — S338XXA Sprain of other parts of lumbar spine and pelvis, initial encounter: Secondary | ICD-10-CM | POA: Diagnosis not present

## 2017-10-01 DIAGNOSIS — M9902 Segmental and somatic dysfunction of thoracic region: Secondary | ICD-10-CM | POA: Diagnosis not present

## 2017-10-01 DIAGNOSIS — S39012A Strain of muscle, fascia and tendon of lower back, initial encounter: Secondary | ICD-10-CM | POA: Diagnosis not present

## 2017-10-07 ENCOUNTER — Other Ambulatory Visit: Payer: Self-pay | Admitting: Cardiovascular Disease

## 2017-10-07 DIAGNOSIS — H353132 Nonexudative age-related macular degeneration, bilateral, intermediate dry stage: Secondary | ICD-10-CM | POA: Diagnosis not present

## 2017-10-07 NOTE — Telephone Encounter (Signed)
Rx sent to pharmacy   

## 2017-10-08 DIAGNOSIS — S338XXA Sprain of other parts of lumbar spine and pelvis, initial encounter: Secondary | ICD-10-CM | POA: Diagnosis not present

## 2017-10-08 DIAGNOSIS — M9903 Segmental and somatic dysfunction of lumbar region: Secondary | ICD-10-CM | POA: Diagnosis not present

## 2017-10-08 DIAGNOSIS — M9902 Segmental and somatic dysfunction of thoracic region: Secondary | ICD-10-CM | POA: Diagnosis not present

## 2017-10-08 DIAGNOSIS — M9905 Segmental and somatic dysfunction of pelvic region: Secondary | ICD-10-CM | POA: Diagnosis not present

## 2017-10-08 DIAGNOSIS — S29012A Strain of muscle and tendon of back wall of thorax, initial encounter: Secondary | ICD-10-CM | POA: Diagnosis not present

## 2017-10-08 DIAGNOSIS — S39012A Strain of muscle, fascia and tendon of lower back, initial encounter: Secondary | ICD-10-CM | POA: Diagnosis not present

## 2017-10-13 ENCOUNTER — Other Ambulatory Visit: Payer: Self-pay | Admitting: Cardiovascular Disease

## 2017-10-13 NOTE — Telephone Encounter (Signed)
Rx(s) sent to pharmacy electronically.  

## 2017-10-15 ENCOUNTER — Other Ambulatory Visit: Payer: Self-pay | Admitting: Cardiovascular Disease

## 2017-10-19 ENCOUNTER — Other Ambulatory Visit: Payer: Medicare Other

## 2017-10-22 DIAGNOSIS — M9903 Segmental and somatic dysfunction of lumbar region: Secondary | ICD-10-CM | POA: Diagnosis not present

## 2017-10-22 DIAGNOSIS — S29012A Strain of muscle and tendon of back wall of thorax, initial encounter: Secondary | ICD-10-CM | POA: Diagnosis not present

## 2017-10-22 DIAGNOSIS — M9902 Segmental and somatic dysfunction of thoracic region: Secondary | ICD-10-CM | POA: Diagnosis not present

## 2017-10-22 DIAGNOSIS — M9905 Segmental and somatic dysfunction of pelvic region: Secondary | ICD-10-CM | POA: Diagnosis not present

## 2017-10-22 DIAGNOSIS — S338XXA Sprain of other parts of lumbar spine and pelvis, initial encounter: Secondary | ICD-10-CM | POA: Diagnosis not present

## 2017-10-22 DIAGNOSIS — S39012A Strain of muscle, fascia and tendon of lower back, initial encounter: Secondary | ICD-10-CM | POA: Diagnosis not present

## 2017-10-23 DIAGNOSIS — M25551 Pain in right hip: Secondary | ICD-10-CM | POA: Diagnosis not present

## 2017-10-23 DIAGNOSIS — M545 Low back pain: Secondary | ICD-10-CM | POA: Diagnosis not present

## 2017-11-02 DIAGNOSIS — M25559 Pain in unspecified hip: Secondary | ICD-10-CM | POA: Diagnosis not present

## 2017-11-02 DIAGNOSIS — M545 Low back pain: Secondary | ICD-10-CM | POA: Diagnosis not present

## 2017-11-06 DIAGNOSIS — H353112 Nonexudative age-related macular degeneration, right eye, intermediate dry stage: Secondary | ICD-10-CM | POA: Diagnosis not present

## 2017-11-10 DIAGNOSIS — Z1231 Encounter for screening mammogram for malignant neoplasm of breast: Secondary | ICD-10-CM | POA: Diagnosis not present

## 2017-11-10 DIAGNOSIS — Z124 Encounter for screening for malignant neoplasm of cervix: Secondary | ICD-10-CM | POA: Diagnosis not present

## 2017-11-10 DIAGNOSIS — Z6824 Body mass index (BMI) 24.0-24.9, adult: Secondary | ICD-10-CM | POA: Diagnosis not present

## 2017-11-12 ENCOUNTER — Encounter: Payer: Self-pay | Admitting: Endocrinology

## 2017-11-12 DIAGNOSIS — M8589 Other specified disorders of bone density and structure, multiple sites: Secondary | ICD-10-CM | POA: Diagnosis not present

## 2017-11-16 ENCOUNTER — Other Ambulatory Visit: Payer: Self-pay | Admitting: Cardiovascular Disease

## 2017-11-16 DIAGNOSIS — S29012A Strain of muscle and tendon of back wall of thorax, initial encounter: Secondary | ICD-10-CM | POA: Diagnosis not present

## 2017-11-16 DIAGNOSIS — M9902 Segmental and somatic dysfunction of thoracic region: Secondary | ICD-10-CM | POA: Diagnosis not present

## 2017-11-16 DIAGNOSIS — S39012A Strain of muscle, fascia and tendon of lower back, initial encounter: Secondary | ICD-10-CM | POA: Diagnosis not present

## 2017-11-16 DIAGNOSIS — M9905 Segmental and somatic dysfunction of pelvic region: Secondary | ICD-10-CM | POA: Diagnosis not present

## 2017-11-16 DIAGNOSIS — M9903 Segmental and somatic dysfunction of lumbar region: Secondary | ICD-10-CM | POA: Diagnosis not present

## 2017-11-16 DIAGNOSIS — S338XXA Sprain of other parts of lumbar spine and pelvis, initial encounter: Secondary | ICD-10-CM | POA: Diagnosis not present

## 2017-11-18 ENCOUNTER — Other Ambulatory Visit (INDEPENDENT_AMBULATORY_CARE_PROVIDER_SITE_OTHER): Payer: Medicare Other

## 2017-11-18 DIAGNOSIS — R7303 Prediabetes: Secondary | ICD-10-CM

## 2017-11-18 LAB — COMPREHENSIVE METABOLIC PANEL
ALT: 22 U/L (ref 0–35)
AST: 24 U/L (ref 0–37)
Albumin: 4.3 g/dL (ref 3.5–5.2)
Alkaline Phosphatase: 52 U/L (ref 39–117)
BUN: 12 mg/dL (ref 6–23)
CO2: 29 mEq/L (ref 19–32)
Calcium: 10.1 mg/dL (ref 8.4–10.5)
Chloride: 98 mEq/L (ref 96–112)
Creatinine, Ser: 0.63 mg/dL (ref 0.40–1.20)
GFR: 97.96 mL/min (ref >60.00)
Glucose, Bld: 123 mg/dL — ABNORMAL HIGH (ref 70–99)
Potassium: 3.8 mEq/L (ref 3.5–5.1)
Sodium: 134 mEq/L — ABNORMAL LOW (ref 135–145)
Total Bilirubin: 0.5 mg/dL (ref 0.2–1.2)
Total Protein: 7.4 g/dL (ref 6.0–8.3)

## 2017-11-18 LAB — HEMOGLOBIN A1C: Hgb A1c MFr Bld: 6.1 % (ref 4.6–6.5)

## 2017-12-01 ENCOUNTER — Other Ambulatory Visit: Payer: Medicare Other

## 2017-12-02 ENCOUNTER — Other Ambulatory Visit: Payer: Self-pay | Admitting: Cardiovascular Disease

## 2017-12-06 DIAGNOSIS — H353112 Nonexudative age-related macular degeneration, right eye, intermediate dry stage: Secondary | ICD-10-CM | POA: Diagnosis not present

## 2017-12-06 NOTE — Progress Notes (Signed)
Patient ID: Brittany Figueroa, female   DOB: 07-23-1940, 77 y.o.   MRN: [id number]           Chief complaint: Endocrinology follow-up  History of Present Illness:  Brittany Figueroa was told by Brittany Figueroa PCP that Brittany Figueroa has type 2 diabetes based on an A1c of 6.5% in 07/2013 Brittany Figueroa has not had any other A1c is subsequently above normal, normal range and previous lab 6.2 or less Brittany Figueroa also has not had any abnormal blood sugars except a glucose of 125 in 3/16  Brittany Figueroa does have increased fasting blood sugars ranging from 108-125 since about 2012  Brittany Figueroa may have been told about 10 years ago that Brittany Figueroa had prediabetes and reportedly had a normal glucose tolerance test.  GLUCOSE TOLERANCE test on 05/23/15: Fasting glucose 110, two-hour glucose 192  RECENT history:   Brittany Figueroa has been intermittently on metformin ER since about 08/2015 In 1/18 when A1c was 6.5 and glucose 133 fasting Brittany Figueroa was told to start back on metformin and also improve Brittany Figueroa diet Brittany Figueroa was taking 1500 mg a day but could not tolerate this because of feeling of bloating  Now taking 500 mg twice a day with minimal bloating as side effect  A1c is the about same now at 6.1 %   Home blood sugars have been checked regularly and Brittany Figueroa has brought Brittany Figueroa record with Brittany Figueroa but not Brittany Figueroa meter Blood sugars are as follows with fairly good readings similar to Brittany Figueroa previous visit  Brittany Figueroa tries to exercise when Brittany Figueroa can go to the [facility] Generally trying to watch Brittany Figueroa carbohydrates and portions Brittany Figueroa weight is stable  Home readings using the FreeStyle   Mean values apply above for all meters except median for One Touch  PRE-MEAL Fasting Lunch Dinner Bedtime Overall  Glucose range: 93-113  143    Mean/median:        POST-MEAL PC Breakfast PC Lunch PC Dinner  Glucose range:  167   Mean/median:       Weight history: Previous range 139-143  Wt Readings from Last 3 Encounters:  12/07/17 134 lb 12.8 oz (61.1 kg)  09/29/17 132 lb (59.9 kg)  06/26/17 133 lb (60.3 kg)    Lab Results    Component Value Date   HGBA1C 6.1 11/18/2017   HGBA1C 6.1 06/23/2017   HGBA1C 6.0 02/17/2017   Lab Results  Component Value Date   LDLCALC 93 02/17/2017   CREATININE 0.63 11/18/2017   OSTEOPENIA: See review of systems    No visits with results within 1 Week(s) from this visit.  Latest known visit with results is:  Lab on 11/18/2017  Component Date Value Ref Range Status  . Sodium 11/18/2017 134* 135 - 145 mEq/L Final  . Potassium 11/18/2017 3.8  3.5 - 5.1 mEq/L Final  . Chloride 11/18/2017 98  96 - 112 mEq/L Final  . CO2 11/18/2017 29  19 - 32 mEq/L Final  . Glucose, Bld 11/18/2017 123* 70 - 99 mg/dL Final  . BUN 11/18/2017 12  6 - 23 mg/dL Final  . Creatinine, Ser 11/18/2017 0.63  0.40 - 1.20 mg/dL Final  . Total Bilirubin 11/18/2017 0.5  0.2 - 1.2 mg/dL Final  . Alkaline Phosphatase 11/18/2017 52  39 - 117 U/L Final  . AST 11/18/2017 24  0 - 37 U/L Final  . ALT 11/18/2017 22  0 - 35 U/L Final  . Total Protein 11/18/2017 7.4  6.0 - 8.3 g/dL Final  . Albumin 11/18/2017 4.3  3.5 - 5.2  g/dL Final  . Calcium 11/18/2017 10.1  8.4 - 10.5 mg/dL Final  . GFR 11/18/2017 97.96  >60.00 mL/min Final  . Hgb A1c MFr Bld 11/18/2017 6.1  4.6 - 6.5 % Final   Glycemic Control Guidelines for People with Diabetes:Non Diabetic:  <6%Goal of Therapy: <7%Additional Action Suggested:  >8%       Past Medical History:  Diagnosis Date  . Abnormal finding on EKG, new anterolateral T-wave inversions  05/14/2012  . Abnormal nuclear stress test, with infero-lateral ischemia 05/14/2012  . Arthritis    "right thumb; all my fingers" (05/13/2012), cervical spondylosis   . Atypical angina ([place]) 05/14/2012  . CAD (coronary artery disease), 05/13/12, with 80% LAD 05/14/2012  . Cancer ([place])    basal cell on facial in L temporal region    . CMC arthritis, thumb, degenerative   . Complication of anesthesia 12/2009   "had endoscopy; larynx went into spasms; stopped breathing for 15 seconds" (05/13/2012)  .  GERD (gastroesophageal reflux disease) 2011  . Hypercholesteremia   . Hypertension   . S/P angioplasty with stent, LAD 05/13/12 05/14/2012  . Tuberculosis    test +- [place] [facility] - Dr. [name]- CXR- OK    Past Surgical History:  Procedure Laterality Date  . ANTERIOR CERVICAL DECOMP/DISCECTOMY FUSION N/A 12/13/2013   Procedure: ANTERIOR CERVICAL DECOMPRESSION/DISCECTOMY FUSION 3 LEVELS Cervical four/five,five/six,six/seven. Anterior cervical disectomy and fusion with peek and plate ;  Surgeon: [name], MD;  Location: [place] [facility] OR[facility];  Service: Neurosurgery;  Laterality: N/A;  . CARDIAC CATHETERIZATION N/A 07/02/2015   Procedure: Left Heart Cath and Coronary Angiography;  Surgeon: [name], MD;  Location: [place] CV LAB;  Service: Cardiovascular;  Laterality: N/A;  . CORONARY ANGIOPLASTY WITH STENT PLACEMENT  05/13/2012   DES to LAD; Brittany Figueroa has total RCA with left to rt coll. normal LV function done for positive nuc study  . DILATION AND CURETTAGE OF UTERUS  1960's; 1970's; 1980   "probably 3" (05/13/2012)  . FOREARM / WRIST TENDON LESION EXCISION  ?11/2001   "right; did waver to try to get ulnar out of hole that it had cut" (05/13/2012  . INGUINAL HERNIA REPAIR  ~ 1966; 07/21/2002   "right; left" (05/13/2012)  . LEFT HEART CATHETERIZATION WITH CORONARY ANGIOGRAM N/A 05/13/2012   Procedure: LEFT HEART CATHETERIZATION WITH CORONARY ANGIOGRAM;  Surgeon: [name], MD;  [facility] CATH LAB;  Service: Cardiovascular;  Laterality: N/A;  . OSTEOTOMY AND ULNAR SHORTENING  07/21/2002   "right" (05/13/2012)  . TONSILLECTOMY AND ADENOIDECTOMY  1950's  . TUBAL LIGATION  1980    Family History  Problem Relation Age of Onset  . Hypertension Mother   . Diabetes Mother   . Hyperlipidemia Father   . Heart disease Father   . Stroke Father   . Heart disease Sister   . Diabetes Sister   . Heart disease Brother   . Diabetes Maternal Aunt     Social History:  reports that Brittany Figueroa quit  smoking about 29 years ago. Brittany Figueroa smoking use included cigarettes. Brittany Figueroa has a 10.00 pack-year smoking history. Brittany Figueroa has never used smokeless tobacco. Brittany Figueroa reports that Brittany Figueroa drinks about 7.2 oz of alcohol per week. Brittany Figueroa reports that Brittany Figueroa does not use drugs.  Allergies:  Allergies  Allergen Reactions  . Statins Other (See Comments)    "intermittent loss of circulation; hands and arms will go to sleep; feet will get cramps; fatigue" (05/13/2012).  This occurred with Lipitor, Zocor, Crestor 5 mg  qd, and Brittany Figueroa thinks pravastatin as well  . Amlodipine Itching  . Gadolinium Derivatives Itching and Nausea Only    Pt had severe nausea and itching on legs, neck and back, per dr [name] pt needs 13 hour prep before contrast in the future  . Metformin And Related Other (See Comments)    Abdominal bloating    Allergies as of 12/07/2017      Reactions   Statins Other (See Comments)   "intermittent loss of circulation; hands and arms will go to sleep; feet will get cramps; fatigue" (05/13/2012).  This occurred with Lipitor, Zocor, Crestor 5 mg qd, and Brittany Figueroa thinks pravastatin as well   Amlodipine Itching   Gadolinium Derivatives Itching, Nausea Only   Pt had severe nausea and itching on legs, neck and back, per dr [name] pt needs 13 hour prep before contrast in the future   Metformin And Related Other (See Comments)   Abdominal bloating      Medication List        Accurate as of 12/07/17 10:38 AM. Always use your most recent med list.          acetaminophen 500 MG tablet Commonly known as:  TYLENOL Take 500 mg by mouth every 6 (six) hours as needed for mild pain or headache.   aspirin 81 MG tablet Take 81 mg by mouth daily.   atorvastatin 10 MG tablet Commonly known as:  LIPITOR TAKE 1 TABLET(10 MG) BY MOUTH DAILY   calcium-vitamin D 500-200 MG-UNIT tablet Commonly known as:  OSCAL WITH D Take 2 tablets by mouth daily.   carvedilol 6.25 MG tablet Commonly known as:  COREG TAKE 1 TABLET BY MOUTH  TWICE DAILY WITH FOOD   chlorthalidone 25 MG tablet Commonly known as:  HYGROTON TAKE 1/2 TABLET(12.5 MG) BY MOUTH DAILY   clopidogrel 75 MG tablet Commonly known as:  PLAVIX TAKE 1 TABLET(75 MG) BY MOUTH DAILY   diclofenac sodium 1 % Gel Commonly known as:  VOLTAREN Apply 2 g topically daily as needed. For pain   ezetimibe 10 MG tablet Commonly known as:  ZETIA TAKE 1 TABLET(10 MG) BY MOUTH DAILY   fenofibrate 160 MG tablet TAKE 1 TABLET(160 MG) BY MOUTH DAILY   glucose blood test strip Commonly known as:  ONETOUCH VERIO Use to test blood sugar once daily Dx code E11.9   hydrocortisone valerate cream 0.2 % Commonly known as:  WESTCORT Apply 1 application topically daily as needed (ITCHING).   irbesartan 150 MG tablet Commonly known as:  AVAPRO TAKE 1 TABLET BY MOUTH TWICE DAILY   irbesartan 300 MG tablet Commonly known as:  AVAPRO TAKE 1 TABLET(300 MG) BY MOUTH DAILY   metFORMIN 500 MG 24 hr tablet Commonly known as:  GLUCOPHAGE-XR TAKE 2 TABLETS(1000 MG) BY MOUTH DAILY WITH SUPPER   multivitamin with minerals Tabs tablet Take 1 tablet by mouth daily.   nitroGLYCERIN 0.4 MG SL tablet Commonly known as:  NITROSTAT PLACE 1 TABLET UNDER THE TONGUE EVERY 5 MINUTES AS NEEDED FOR CHEST PAIN   Vitamin D3 2000 units Tabs Take 2,000 Units by mouth 2 (two) times daily.       LABS:  No visits with results within 1 Week(s) from this visit.  Latest known visit with results is:  Lab on 11/18/2017  Component Date Value Ref Range Status  . Sodium 11/18/2017 134* 135 - 145 mEq/L Final  . Potassium 11/18/2017 3.8  3.5 - 5.1 mEq/L Final  . Chloride 11/18/2017 98  96 - 112 mEq/L Final  . CO2 11/18/2017 29  19 - 32 mEq/L Final  . Glucose, Bld 11/18/2017 123* 70 - 99 mg/dL Final  . BUN 11/18/2017 12  6 - 23 mg/dL Final  . Creatinine, Ser 11/18/2017 0.63  0.40 - 1.20 mg/dL Final  . Total Bilirubin 11/18/2017 0.5  0.2 - 1.2 mg/dL Final  . Alkaline Phosphatase 11/18/2017  52  39 - 117 U/L Final  . AST 11/18/2017 24  0 - 37 U/L Final  . ALT 11/18/2017 22  0 - 35 U/L Final  . Total Protein 11/18/2017 7.4  6.0 - 8.3 g/dL Final  . Albumin 11/18/2017 4.3  3.5 - 5.2 g/dL Final  . Calcium 11/18/2017 10.1  8.4 - 10.5 mg/dL Final  . GFR 11/18/2017 97.96  >60.00 mL/min Final  . Hgb A1c MFr Bld 11/18/2017 6.1  4.6 - 6.5 % Final   Glycemic Control Guidelines for People with Diabetes:Non Diabetic:  <6%Goal of Therapy: <7%Additional Action Suggested:  >8%       Review of Systems    OSTEOPENIA: Brittany Figueroa had a follow-up bone density in June 2019 but has not had a discussion with Brittany Figueroa PCP about this Lowest T score is at the right neck femur with a value of -2.4 compared to 2.0, 2 years previously and Brittany Figueroa has not been on treatment Brittany Figueroa does however say that Brittany Figueroa had tried Fosamax in the past and this caused some GI side effects and does not want to try it again     Other active medical issues followed by various physicians:  Hypertension, treated with Avapro, chlorthalidone half tablet and Coreg, Followed by PCP   History of hyperlipidemia with some intolerance to statins, on Fenofibrate, 10 mg Lipitor and also on Zetia.  This is followed by cardiologist in the lipid clinic  Last LDL 93  Lab Results  Component Value Date   CHOL 161 02/17/2017   HDL 42.40 02/17/2017   LDLCALC 93 02/17/2017   LDLDIRECT 130.1 04/04/2014   TRIG 127.0 02/17/2017   CHOLHDL 4 02/17/2017   HYPONATREMIA: This is mild and related to Brittany Figueroa chlorthalidone  Lab Results  Component Value Date   CREATININE 0.63 11/18/2017   BUN 12 11/18/2017   NA 134 (L) 11/18/2017   K 3.8 11/18/2017   CL 98 11/18/2017   CO2 29 11/18/2017     ?  HYPOTHYROIDISM: TSH 4.55 in 8/17 and subsequently 5.6 This is back to normal consistently without any treatment No symptoms of unusual fatigue  Lab Results  Component Value Date   TSH 4.22 06/23/2017   TSH 3.84 07/18/2016   TSH 5.59 (H) 06/03/2016   FREET4  1.03 06/03/2016      PHYSICAL EXAM:  BP 124/68 (BP Location: Left Arm, Patient Position: Sitting, Cuff Size: Normal)   Pulse 78   Ht 5\' 1"  (1.549 m)   Wt 134 lb 12.8 oz (61.1 kg)   SpO2 97%   BMI 25.47 kg/m     No ankle edema present  ASSESSMENT:   Prediabetes with impaired fasting glucose and also impaired glucose tolerance Highest A1c Previously 6.5 .   Previously highest fasting glucose 133  Brittany Figueroa is on metformin 500 mg twice daily and tolerating this well now  Brittany Figueroa is trying to be good with Brittany Figueroa meal planning and making  efforts to exercise Fasting readings at home continue to be mildly increased but not in diabetic range A1c is also quite stable at 6.1   Considering Brittany Figueroa  age Brittany Figueroa is doing well, has had prediabetes for several years  OSTEOPENIA: Brittany Figueroa has had some progression in Brittany Figueroa bone density scores, lowest T score is -2.4 and needs to be on pharmacological treatment Previously intolerant to Fosamax    PLAN:   Continue metformin  May check blood sugars 3 times a week, alternating fasting and after meals Regular exercise again recommended  For Brittany Figueroa osteopenia Brittany Figueroa agrees to take Reclast since Brittany Figueroa was intolerant to Fosamax For now since Brittany Figueroa only has osteopenia Brittany Figueroa can get this every 2 years and this will be scheduled, discussed benefits and possible side effects Brittany Figueroa also needs to be continuing OTC calcium and vitamin D and Brittany Figueroa vitamin D level checked periodically  Follow-up in 6 months    Glendy Barsanti 12/07/2017, 10:38 AM

## 2017-12-07 ENCOUNTER — Ambulatory Visit (INDEPENDENT_AMBULATORY_CARE_PROVIDER_SITE_OTHER): Payer: Medicare Other | Admitting: Endocrinology

## 2017-12-07 ENCOUNTER — Encounter: Payer: Self-pay | Admitting: Endocrinology

## 2017-12-07 ENCOUNTER — Telehealth: Payer: Self-pay

## 2017-12-07 VITALS — BP 124/68 | HR 78 | Ht 61.0 in | Wt 134.8 lb

## 2017-12-07 DIAGNOSIS — M9905 Segmental and somatic dysfunction of pelvic region: Secondary | ICD-10-CM | POA: Diagnosis not present

## 2017-12-07 DIAGNOSIS — S29012A Strain of muscle and tendon of back wall of thorax, initial encounter: Secondary | ICD-10-CM | POA: Diagnosis not present

## 2017-12-07 DIAGNOSIS — R7303 Prediabetes: Secondary | ICD-10-CM | POA: Diagnosis not present

## 2017-12-07 DIAGNOSIS — M9902 Segmental and somatic dysfunction of thoracic region: Secondary | ICD-10-CM | POA: Diagnosis not present

## 2017-12-07 DIAGNOSIS — R7989 Other specified abnormal findings of blood chemistry: Secondary | ICD-10-CM

## 2017-12-07 DIAGNOSIS — M9903 Segmental and somatic dysfunction of lumbar region: Secondary | ICD-10-CM | POA: Diagnosis not present

## 2017-12-07 DIAGNOSIS — R5383 Other fatigue: Secondary | ICD-10-CM

## 2017-12-07 DIAGNOSIS — I2510 Atherosclerotic heart disease of native coronary artery without angina pectoris: Secondary | ICD-10-CM

## 2017-12-07 DIAGNOSIS — M85851 Other specified disorders of bone density and structure, right thigh: Secondary | ICD-10-CM

## 2017-12-07 DIAGNOSIS — S338XXA Sprain of other parts of lumbar spine and pelvis, initial encounter: Secondary | ICD-10-CM | POA: Diagnosis not present

## 2017-12-07 DIAGNOSIS — S39012A Strain of muscle, fascia and tendon of lower back, initial encounter: Secondary | ICD-10-CM | POA: Diagnosis not present

## 2017-12-07 NOTE — Telephone Encounter (Signed)
Could you please schedule this patient for a Reclast infusion per Dr. Dwyane [name]?

## 2017-12-09 NOTE — Telephone Encounter (Signed)
Pt. Called.  Reclast appointment made for 7/30 at 11AM

## 2017-12-10 DIAGNOSIS — L820 Inflamed seborrheic keratosis: Secondary | ICD-10-CM | POA: Diagnosis not present

## 2017-12-10 DIAGNOSIS — L578 Other skin changes due to chronic exposure to nonionizing radiation: Secondary | ICD-10-CM | POA: Diagnosis not present

## 2017-12-15 ENCOUNTER — Ambulatory Visit (INDEPENDENT_AMBULATORY_CARE_PROVIDER_SITE_OTHER): Payer: Medicare Other | Admitting: Endocrinology

## 2017-12-15 DIAGNOSIS — M8580 Other specified disorders of bone density and structure, unspecified site: Secondary | ICD-10-CM

## 2017-12-15 DIAGNOSIS — M810 Age-related osteoporosis without current pathological fracture: Secondary | ICD-10-CM

## 2017-12-15 DIAGNOSIS — Z780 Asymptomatic menopausal state: Secondary | ICD-10-CM

## 2017-12-20 NOTE — Progress Notes (Signed)
Patient is here for her first reclast infusion.   We discussed how this medication works, and the side effects.  She has no questions and signed the consent form. Per Dr. Jodelle Green note on 12/07/17, and IV of Normal Saline was started into her right arm using a 22g needle at 11:15 AM.  After determining that the IV was patent, 5 mg. Of reclast was infused over a 30 min. Period.  She reported no discomfort with this.  After the infusion was completed-at 11:45AM, normal saline was infused for 2 minutes, and the needle was removed.  The site showed no signes of redness or swelling.  She was told to keep the pressure bandage on for at least 20 min.  And to continue to drink 6-8 8 ounce glasses of water today.  She was also encouraged to continue to take her calcium and Vit. D per Dr. Ronnie Derby orders.  She agreed to do this       The above note was reviewed and agree with documentation

## 2017-12-20 NOTE — Patient Instructions (Signed)
Drink 6-8 glasses of water today. Continue to take your calcium and Vitamin D per Dr. Ronnie Derby order Call if questions

## 2017-12-23 DIAGNOSIS — I10 Essential (primary) hypertension: Secondary | ICD-10-CM | POA: Diagnosis not present

## 2017-12-23 DIAGNOSIS — Z79899 Other long term (current) drug therapy: Secondary | ICD-10-CM | POA: Diagnosis not present

## 2017-12-23 DIAGNOSIS — M2550 Pain in unspecified joint: Secondary | ICD-10-CM | POA: Diagnosis not present

## 2017-12-23 DIAGNOSIS — R7303 Prediabetes: Secondary | ICD-10-CM | POA: Diagnosis not present

## 2017-12-23 DIAGNOSIS — E782 Mixed hyperlipidemia: Secondary | ICD-10-CM | POA: Diagnosis not present

## 2017-12-23 DIAGNOSIS — M810 Age-related osteoporosis without current pathological fracture: Secondary | ICD-10-CM | POA: Diagnosis not present

## 2018-01-04 DIAGNOSIS — S338XXA Sprain of other parts of lumbar spine and pelvis, initial encounter: Secondary | ICD-10-CM | POA: Diagnosis not present

## 2018-01-04 DIAGNOSIS — M9903 Segmental and somatic dysfunction of lumbar region: Secondary | ICD-10-CM | POA: Diagnosis not present

## 2018-01-04 DIAGNOSIS — M9905 Segmental and somatic dysfunction of pelvic region: Secondary | ICD-10-CM | POA: Diagnosis not present

## 2018-01-04 DIAGNOSIS — M9902 Segmental and somatic dysfunction of thoracic region: Secondary | ICD-10-CM | POA: Diagnosis not present

## 2018-01-04 DIAGNOSIS — S29012A Strain of muscle and tendon of back wall of thorax, initial encounter: Secondary | ICD-10-CM | POA: Diagnosis not present

## 2018-01-04 DIAGNOSIS — S39012A Strain of muscle, fascia and tendon of lower back, initial encounter: Secondary | ICD-10-CM | POA: Diagnosis not present

## 2018-01-05 DIAGNOSIS — H353112 Nonexudative age-related macular degeneration, right eye, intermediate dry stage: Secondary | ICD-10-CM | POA: Diagnosis not present

## 2018-01-14 ENCOUNTER — Other Ambulatory Visit: Payer: Self-pay | Admitting: Endocrinology

## 2018-01-14 ENCOUNTER — Other Ambulatory Visit: Payer: Self-pay | Admitting: Cardiovascular Disease

## 2018-01-20 DIAGNOSIS — Z79899 Other long term (current) drug therapy: Secondary | ICD-10-CM | POA: Diagnosis not present

## 2018-01-25 ENCOUNTER — Other Ambulatory Visit: Payer: Self-pay | Admitting: Cardiovascular Disease

## 2018-02-04 DIAGNOSIS — H353112 Nonexudative age-related macular degeneration, right eye, intermediate dry stage: Secondary | ICD-10-CM | POA: Diagnosis not present

## 2018-02-24 DIAGNOSIS — H10413 Chronic giant papillary conjunctivitis, bilateral: Secondary | ICD-10-CM | POA: Diagnosis not present

## 2018-02-24 DIAGNOSIS — H2513 Age-related nuclear cataract, bilateral: Secondary | ICD-10-CM | POA: Diagnosis not present

## 2018-02-24 DIAGNOSIS — H353111 Nonexudative age-related macular degeneration, right eye, early dry stage: Secondary | ICD-10-CM | POA: Diagnosis not present

## 2018-02-24 DIAGNOSIS — H353121 Nonexudative age-related macular degeneration, left eye, early dry stage: Secondary | ICD-10-CM | POA: Diagnosis not present

## 2018-02-25 ENCOUNTER — Other Ambulatory Visit: Payer: Self-pay | Admitting: Cardiovascular Disease

## 2018-03-01 ENCOUNTER — Other Ambulatory Visit: Payer: Self-pay

## 2018-03-01 ENCOUNTER — Telehealth: Payer: Self-pay | Admitting: Endocrinology

## 2018-03-01 MED ORDER — METFORMIN HCL ER 500 MG PO TB24: ORAL_TABLET | ORAL | 1 refills | Status: DC

## 2018-03-01 NOTE — Telephone Encounter (Signed)
This has been done.

## 2018-03-01 NOTE — Telephone Encounter (Signed)
Patient is stating they have questions about metFORMIN (GLUCOPHAGE-XR) 500 MG 24 hr tablet. She is having bladder pain in the middle of the night. Blood sugar is high in the morning. Please Advise. Ph # [phone]

## 2018-03-06 DIAGNOSIS — H353112 Nonexudative age-related macular degeneration, right eye, intermediate dry stage: Secondary | ICD-10-CM | POA: Diagnosis not present

## 2018-03-10 DIAGNOSIS — Z23 Encounter for immunization: Secondary | ICD-10-CM | POA: Diagnosis not present

## 2018-03-11 DIAGNOSIS — H6123 Impacted cerumen, bilateral: Secondary | ICD-10-CM | POA: Diagnosis not present

## 2018-03-11 DIAGNOSIS — H8113 Benign paroxysmal vertigo, bilateral: Secondary | ICD-10-CM | POA: Diagnosis not present

## 2018-03-11 DIAGNOSIS — R42 Dizziness and giddiness: Secondary | ICD-10-CM | POA: Diagnosis not present

## 2018-03-12 DIAGNOSIS — L820 Inflamed seborrheic keratosis: Secondary | ICD-10-CM | POA: Diagnosis not present

## 2018-03-15 ENCOUNTER — Telehealth: Payer: Self-pay | Admitting: Cardiovascular Disease

## 2018-03-15 NOTE — Telephone Encounter (Signed)
Returned call to patient of Dr. Gwenlyn Found. She reports dizziness/loss of balance/staggering. She reports she has been having really good BP readings but reports feelings of imbalance when getting up in the AM - not during the day. She reports these symptoms with change in position.   She saw ENT who did not feel like she has vertigo. Her ENT recommend she try to check orthostatic BPs at home. She purchased a wrist cuff to try this which she is unsure if this is accurate. She has not checked orthostatic BPs - explained how to do so to patient.  She reports BP is always a tiny bit high in the AM 10/27 - 10:39pm   113/64 HR 91 10/28 - 3am:    110s/46 10/28 - 7am:    137/79 HR 63 10/28 - prior to phone call:      138/84 HR 72 Date unknown -   113/64 HR 91  She reports low diastolic readings in the middle of the night (40s-50s). Advised that she does not need to check her BP in the middle of the night.   She takes carvedilol 6.25mg  BID, chlorthalidone 12.5mg  QD, irbesartan 150mg  BID  Will route to MD and [facility] to review patient's home BP readings and concerns and review/advise

## 2018-03-15 NOTE — Telephone Encounter (Signed)
New Message        STAT if patient feels like he/she is going to faint   1) Are you dizzy now? No  2) Do you feel faint or have you passed out? No   3) Do you have any other symptoms? Lost of balance  4) Have you checked your HR and BP (record if available)? Heart rate was fine,               BP bottom # was 46. Patient states she is waking up dizzy everyday for the last 3 weeks.

## 2018-03-15 NOTE — Telephone Encounter (Signed)
BP and HR reading are looking good.  Will need orthostatic reading to determine if change in therapy is needed.  Recommendation:  1. STAT hydrated 2. Monitor BP twice daily and keep records 3. Schedule appt with HTN clinic - if patient agreeable to see pharmacist. We can check accuracy of wrist cuff ,, monir BP in office ,and adjust medication as needed

## 2018-03-15 NOTE — Telephone Encounter (Signed)
Patient called with [facility] recommendations. She agreed with plan. Advised patient to check BP 1-2 hours after each coreg dose - to see how her medications are affecting BP/HR vs first thing in AM. She has been scheduled for CV[facility] BP clinic appt on 11/7 @ 10am. She was advised to bring in home BP cuff and readings to appt.

## 2018-03-25 ENCOUNTER — Ambulatory Visit (INDEPENDENT_AMBULATORY_CARE_PROVIDER_SITE_OTHER): Payer: Medicare Other | Admitting: Pharmacist

## 2018-03-25 VITALS — BP 128/64 | HR 70 | Ht 61.0 in | Wt 137.0 lb

## 2018-03-25 DIAGNOSIS — I10 Essential (primary) hypertension: Secondary | ICD-10-CM | POA: Diagnosis not present

## 2018-03-25 DIAGNOSIS — I2510 Atherosclerotic heart disease of native coronary artery without angina pectoris: Secondary | ICD-10-CM

## 2018-03-25 MED ORDER — CHLORTHALIDONE 25 MG PO TABS: 12.5000 mg | ORAL_TABLET | ORAL | 3 refills | Status: DC

## 2018-03-25 MED ORDER — IRBESARTAN 300 MG PO TABS: 150.0000 mg | ORAL_TABLET | Freq: Two times a day (BID) | ORAL | 6 refills | Status: DC

## 2018-03-25 NOTE — Patient Instructions (Addendum)
Return for a  follow up appointment in 4 weeks  Go to the lab in 7-8 DAYS  Check your blood pressure at home daily (if able) and keep record of the readings.  Take your BP meds as follows: *DECREASE CHLORTHALIDONE TO 12.5MG  EVERY OTHER DAY*  Bring all of your meds, your BP cuff and your record of home blood pressures to your next appointment.  Exercise as you're able, try to walk approximately 30 minutes per day.  Keep salt intake to a minimum, especially watch canned and prepared boxed foods.  Eat more fresh fruits and vegetables and fewer canned items.  Avoid eating in fast food restaurants.    HOW TO TAKE YOUR BLOOD PRESSURE: . Rest 5 minutes before taking your blood pressure. .  Don't smoke or drink caffeinated beverages for at least 30 minutes before. . Take your blood pressure before (not after) you eat. . Sit comfortably with your back supported and both feet on the floor (don't cross your legs). . Elevate your arm to heart level on a table or a desk. . Use the proper sized cuff. It should fit smoothly and snugly around your bare upper arm. There should be enough room to slip a fingertip under the cuff. The bottom edge of the cuff should be 1 inch above the crease of the elbow. . Ideally, take 3 measurements at one sitting and record the average.

## 2018-03-25 NOTE — Progress Notes (Signed)
Patient ID: Brittany Figueroa                 DOB: 1940/11/12                      MRN: [id number]     HPI: Brittany Figueroa is a 77 y.o. female referred by Dr. Gwenlyn Found to HTN clinic. PMH includes atypical angina, dyslipidemia, CAD, hypertension, and pre-diabetes. Patient reports dizziness at night especially if she wakes up in the middle of the night. Noted previous history of vertigo and some balance problems not related to her ears per ENT assessment. Patient is also worried about her diastolic BP readings persistently under 80 mmHg.  She is currently taking chlorthalidone with stable renal function per BMP repeat on 11/2017. Noted remote history of low sodium and low potassium as well.  Current HTN meds:  Carvedilol 6.25mg  twice daily Chlorthalidone 12.5mg  daily  Irbesartan 150mg  twice daily  Previously tried:  Amlodipine - itching  BP goal: 130/80  Family History: mother and multiple siblings with hyperlipidemia, hypertension; knows little of father's family  Social History: former smoker, 12 glasses of wine per week  Diet: uses some salt, but only on specific items; lean meats, vegetables; no fried foods, low carbohydrates  Home BP readings:  Omron intelli sense (arm): accurate within 54mmHg from manual reading Wrist equate : accurate with appropriate technique   130/75  126/71  146/91  145/88  113/57  140/71  140/84  129/75  Wt Readings from Last 3 Encounters:  03/25/18 137 lb (62.1 kg)  12/07/17 134 lb 12.8 oz (61.1 kg)  09/29/17 132 lb (59.9 kg)   BP Readings from Last 3 Encounters:  03/25/18 128/64  12/07/17 124/68  09/29/17 139/69   Pulse Readings from Last 3 Encounters:  03/25/18 70  12/07/17 78  09/29/17 65    Past Medical History:  Diagnosis Date  . Abnormal finding on EKG, new anterolateral T-wave inversions  05/14/2012  . Abnormal nuclear stress test, with infero-lateral ischemia 05/14/2012  . Arthritis    "right thumb; all my fingers" (05/13/2012), cervical  spondylosis   . Atypical angina ([place]) 05/14/2012  . CAD (coronary artery disease), 05/13/12, with 80% LAD 05/14/2012  . Cancer ([place])    basal cell on facial in L temporal region    . CMC arthritis, thumb, degenerative   . Complication of anesthesia 12/2009   "had endoscopy; larynx went into spasms; stopped breathing for 15 seconds" (05/13/2012)  . GERD (gastroesophageal reflux disease) 2011  . Hypercholesteremia   . Hypertension   . S/P angioplasty with stent, LAD 05/13/12 05/14/2012  . Tuberculosis    test +- [facility] [facility] - Dr. [name]- CXR- OK    Current Outpatient Medications on File Prior to Visit  Medication Sig Dispense Refill  . acetaminophen (TYLENOL) 500 MG tablet Take 500 mg by mouth every 6 (six) hours as needed for mild pain or headache.    Marland Kitchen aspirin 81 MG tablet Take 81 mg by mouth daily.    Marland Kitchen atorvastatin (LIPITOR) 10 MG tablet TAKE 1 TABLET(10 MG) BY MOUTH DAILY 90 tablet 4  . calcium-vitamin D (OSCAL WITH D) 500-200 MG-UNIT per tablet Take 2 tablets by mouth daily.    . carvedilol (COREG) 6.25 MG tablet TAKE 1 TABLET BY MOUTH TWICE DAILY WITH FOOD 180 tablet 2  . Cholecalciferol (VITAMIN D3) 2000 UNITS TABS Take 2,000 Units by mouth 2 (two) times daily.    . clopidogrel (PLAVIX) 75  MG tablet TAKE 1 TABLET(75 MG) BY MOUTH DAILY 90 tablet 3  . diclofenac sodium (VOLTAREN) 1 % GEL Apply 2 g topically daily as needed. For pain    . ezetimibe (ZETIA) 10 MG tablet TAKE 1 TABLET(10 MG) BY MOUTH DAILY 30 tablet 6  . fenofibrate 160 MG tablet TAKE 1 TABLET(160 MG) BY MOUTH DAILY 90 tablet 4  . glucose blood (ONETOUCH VERIO) test strip Use to test blood sugar once daily Dx code E11.9 30 each 4  . hydrocortisone valerate cream (WESTCORT) 0.2 % Apply 1 application topically daily as needed (ITCHING).    . metFORMIN (GLUCOPHAGE-XR) 500 MG 24 hr tablet TAKE 2 TABLETS(1000 MG) BY MOUTH DAILY WITH SUPPER 180 tablet 1  . Multiple Vitamin (MULTIVITAMIN WITH MINERALS) TABS Take 1 tablet by  mouth daily.    . nitroGLYCERIN (NITROSTAT) 0.4 MG SL tablet PLACE 1 TABLET UNDER THE TONGUE EVERY 5 MINUTES AS NEEDED FOR CHEST PAIN 25 tablet 3   No current facility-administered medications on file prior to visit.     Allergies  Allergen Reactions  . Statins Other (See Comments)    "intermittent loss of circulation; hands and arms will go to sleep; feet will get cramps; fatigue" (05/13/2012).  This occurred with Lipitor, Zocor, Crestor 5 mg qd, and she thinks pravastatin as well  . Amlodipine Itching  . Gadolinium Derivatives Itching and Nausea Only    Pt had severe nausea and itching on legs, neck and back, per dr [name] pt needs 13 hour prep before contrast in the future  . Metformin And Related Other (See Comments)    Abdominal bloating    Blood pressure 128/64, pulse 70, height 5\' 1"  (1.549 m), weight 137 lb (62.1 kg).  Essential hypertension Blood pressure remains appropriate for the last few days and today during office visit today.  Will change chlorthalidone to 12.5mg  every other day and repeat BMET in 1 week to re-assess potassium and sodium levels. Patient is to continue monitoring BP twice daily and bring records to follow up visit in 4 weeks. Plan to re-assess need to chlorthalidone or any other diuretic after BMET results known.    Brittany Figueroa PharmD, BCPS, [place] [address] 03/30/2018 2:16 PM

## 2018-03-30 ENCOUNTER — Encounter: Payer: Self-pay | Admitting: Pharmacist

## 2018-03-30 NOTE — Assessment & Plan Note (Signed)
Blood pressure remains appropriate for the last few days and today during office visit today.  Will change chlorthalidone to 12.5mg  every other day and repeat BMET in 1 week to re-assess potassium and sodium levels. Patient is to continue monitoring BP twice daily and bring records to follow up visit in 4 weeks. Plan to re-assess need to chlorthalidone or any other diuretic after BMET results known.

## 2018-04-05 DIAGNOSIS — I10 Essential (primary) hypertension: Secondary | ICD-10-CM | POA: Diagnosis not present

## 2018-04-05 DIAGNOSIS — H353112 Nonexudative age-related macular degeneration, right eye, intermediate dry stage: Secondary | ICD-10-CM | POA: Diagnosis not present

## 2018-04-05 LAB — CBC
Hematocrit: 36.1 % (ref 34.0–46.6)
Hemoglobin: 12.3 g/dL (ref 11.1–15.9)
MCH: 29.7 pg (ref 26.6–33.0)
MCHC: 34.1 g/dL (ref 31.5–35.7)
MCV: 87 fL (ref 79–97)
Platelets: 425 10*3/uL (ref 150–450)
RBC: 4.14 x10E6/uL (ref 3.77–5.28)
RDW: 12.6 % (ref 12.3–15.4)
WBC: 5.8 10*3/uL (ref 3.4–10.8)

## 2018-04-05 LAB — BASIC METABOLIC PANEL
BUN/Creatinine Ratio: 31 — ABNORMAL HIGH (ref 12–28)
BUN: 22 mg/dL (ref 8–27)
CO2: 24 mmol/L (ref 20–29)
Calcium: 10.2 mg/dL (ref 8.7–10.3)
Chloride: 98 mmol/L (ref 96–106)
Creatinine, Ser: 0.72 mg/dL (ref 0.57–1.00)
GFR calc Af Amer: 95 mL/min/{1.73_m2} (ref >59)
GFR calc non Af Amer: 82 mL/min/{1.73_m2} (ref >59)
Glucose: 106 mg/dL — ABNORMAL HIGH (ref 65–99)
Potassium: 4 mmol/L (ref 3.5–5.2)
Sodium: 136 mmol/L (ref 134–144)

## 2018-04-22 ENCOUNTER — Ambulatory Visit (INDEPENDENT_AMBULATORY_CARE_PROVIDER_SITE_OTHER): Payer: Medicare Other | Admitting: Pharmacist Clinician (PhC)/ Clinical Pharmacy Specialist

## 2018-04-22 DIAGNOSIS — I10 Essential (primary) hypertension: Secondary | ICD-10-CM

## 2018-04-22 DIAGNOSIS — I2510 Atherosclerotic heart disease of native coronary artery without angina pectoris: Secondary | ICD-10-CM

## 2018-04-22 MED ORDER — CARVEDILOL 6.25 MG PO TABS: 9.3750 mg | ORAL_TABLET | Freq: Two times a day (BID) | ORAL | 1 refills | Status: DC

## 2018-04-22 NOTE — Progress Notes (Signed)
Patient ID: Brittany Figueroa                 DOB: 02/25/1941                      MRN: [id number]     HPI: Brittany Figueroa is a 77 y.o. female referred by Dr. Gwenlyn Found to HTN clinic. PMH includes atypical angina, dyslipidemia, CAD, hypertension, and pre-diabetes.  Today she returns for a follow up appointment.  At her last visit she was complaining of increased urination, to the point where she felt she needed to wear a pad.  Her blood pressure was well controlled, but we decreased her chlorthalidone to every other day to see if this would help.  Patient is always hesitant about trying new medications or increasing doses.    Today she reports the urinary problem not improved much since going to every other day with the chlorthalidone.  She would like to discontinue its use.  Her home blood pressure readings have increased some since going to an every other day regimen and the last 10 readings are listed below.    Current HTN meds:  Carvedilol 6.25 mg bid Chlorthalidone 12.5mg  every other day Irbesartan 150mg  twice daily  Previously tried:  Amlodipine - itching  BP goal: 130/80  Family History: mother and multiple siblings with hyperlipidemia, hypertension; knows little of father's family  Social History: former smoker, 12 glasses of wine per week  Diet: uses some salt, but only on specific items; lean meats, vegetables; no fried foods, low carbohydrates  Home BP readings: home wrist cuff accurate within 10 points.  Last 10 readings average 135/71 with a range of 105-154/53-85.  Of note only 2 readings were < 833 systolic (both at 825) and both of these were taken lying flat, first thing in the morning before she got out of bed  Wt Readings from Last 3 Encounters:  03/25/18 137 lb (62.1 kg)  12/07/17 134 lb 12.8 oz (61.1 kg)  09/29/17 132 lb (59.9 kg)   BP Readings from Last 3 Encounters:  04/22/18 (!) 142/76  03/25/18 128/64  12/07/17 124/68   Pulse Readings from Last 3 Encounters:    04/22/18 76  03/25/18 70  12/07/17 78    Past Medical History:  Diagnosis Date  . Abnormal finding on EKG, new anterolateral T-wave inversions  05/14/2012  . Abnormal nuclear stress test, with infero-lateral ischemia 05/14/2012  . Arthritis    "right thumb; all my fingers" (05/13/2012), cervical spondylosis   . Atypical angina ([place]) 05/14/2012  . CAD (coronary artery disease), 05/13/12, with 80% LAD 05/14/2012  . Cancer ([place])    basal cell on facial in L temporal region    . CMC arthritis, thumb, degenerative   . Complication of anesthesia 12/2009   "had endoscopy; larynx went into spasms; stopped breathing for 15 seconds" (05/13/2012)  . GERD (gastroesophageal reflux disease) 2011  . Hypercholesteremia   . Hypertension   . S/P angioplasty with stent, LAD 05/13/12 05/14/2012  . Tuberculosis    test +- [place] [facility] - Dr. [name]- CXR- OK    Current Outpatient Medications on File Prior to Visit  Medication Sig Dispense Refill  . acetaminophen (TYLENOL) 500 MG tablet Take 500 mg by mouth every 6 (six) hours as needed for mild pain or headache.    Marland Kitchen aspirin 81 MG tablet Take 81 mg by mouth daily.    Marland Kitchen atorvastatin (LIPITOR) 10 MG tablet TAKE 1 TABLET(10 MG)  BY MOUTH DAILY 90 tablet 4  . calcium-vitamin D (OSCAL WITH D) 500-200 MG-UNIT per tablet Take 2 tablets by mouth daily.    . chlorthalidone (HYGROTON) 25 MG tablet Take 0.5 tablets (12.5 mg total) by mouth every other day. 15 tablet 3  . Cholecalciferol (VITAMIN D3) 2000 UNITS TABS Take 2,000 Units by mouth 2 (two) times daily.    . clopidogrel (PLAVIX) 75 MG tablet TAKE 1 TABLET(75 MG) BY MOUTH DAILY 90 tablet 3  . diclofenac sodium (VOLTAREN) 1 % GEL Apply 2 g topically daily as needed. For pain    . ezetimibe (ZETIA) 10 MG tablet TAKE 1 TABLET(10 MG) BY MOUTH DAILY 30 tablet 6  . fenofibrate 160 MG tablet TAKE 1 TABLET(160 MG) BY MOUTH DAILY 90 tablet 4  . glucose blood (ONETOUCH VERIO) test strip Use to test blood sugar once  daily Dx code E11.9 30 each 4  . hydrocortisone valerate cream (WESTCORT) 0.2 % Apply 1 application topically daily as needed (ITCHING).    Marland Kitchen irbesartan (AVAPRO) 300 MG tablet Take 0.5 tablets (150 mg total) by mouth 2 (two) times daily. 30 tablet 6  . metFORMIN (GLUCOPHAGE-XR) 500 MG 24 hr tablet TAKE 2 TABLETS(1000 MG) BY MOUTH DAILY WITH SUPPER 180 tablet 1  . Multiple Vitamin (MULTIVITAMIN WITH MINERALS) TABS Take 1 tablet by mouth daily.    . nitroGLYCERIN (NITROSTAT) 0.4 MG SL tablet PLACE 1 TABLET UNDER THE TONGUE EVERY 5 MINUTES AS NEEDED FOR CHEST PAIN 25 tablet 3   No current facility-administered medications on file prior to visit.     Allergies  Allergen Reactions  . Statins Other (See Comments)    "intermittent loss of circulation; hands and arms will go to sleep; feet will get cramps; fatigue" (05/13/2012).  This occurred with Lipitor, Zocor, Crestor 5 mg qd, and she thinks pravastatin as well  . Amlodipine Itching  . Gadolinium Derivatives Itching and Nausea Only    Pt had severe nausea and itching on legs, neck and back, per dr [name] pt needs 13 hour prep before contrast in the future  . Metformin And Related Other (See Comments)    Abdominal bloating    Blood pressure (!) 142/76, pulse 76.  Essential hypertension Patient with essential hypertension, currently with numbers rising since cutting back on chlorthalidone dose. Patient believes this is just a normal fluctuation and that the readings will level out back to normal soon.  I explained that with discontinuing the chlorthalidone this is less likely to occur.   She is hesitant to start another medications (would consider spironolactone or hydralazine), so instead we will try increasing her carvedilol from 6.25 mg to 9.375mg  twice daily.  She is worried that her heart rate will drop too low if we go to 12.5 mg dose.  We will see her again in about 6-8 weeks for follow up.  She was encouraged to work on a heart healthy  diet and increase her exercise if she wants to avoid any further medications.     Tommy Medal PharmD CPP [facility] [address] [place] [address] 04/22/2018 11:16 AM

## 2018-04-22 NOTE — Patient Instructions (Signed)
Return for a a follow up appointment in January  Your blood pressure today is 142/76  Check your blood pressure at home daily and keep record of the readings.  Take your BP meds as follows:   Stop chlorthalidone  Increase carvedilol to 9.375 mg (1.5 tablets) twice daily  Bring all of your meds, your BP cuff and your record of home blood pressures to your next appointment.  Exercise as you're able, try to walk approximately 30 minutes per day.  Keep salt intake to a minimum, especially watch canned and prepared boxed foods.  Eat more fresh fruits and vegetables and fewer canned items.  Avoid eating in fast food restaurants.    HOW TO TAKE YOUR BLOOD PRESSURE: . Rest 5 minutes before taking your blood pressure. .  Don't smoke or drink caffeinated beverages for at least 30 minutes before. . Take your blood pressure before (not after) you eat. . Sit comfortably with your back supported and both feet on the floor (don't cross your legs). . Elevate your arm to heart level on a table or a desk. . Use the proper sized cuff. It should fit smoothly and snugly around your bare upper arm. There should be enough room to slip a fingertip under the cuff. The bottom edge of the cuff should be 1 inch above the crease of the elbow. . Ideally, take 3 measurements at one sitting and record the average.   p

## 2018-04-22 NOTE — Assessment & Plan Note (Signed)
Patient with essential hypertension, currently with numbers rising since cutting back on chlorthalidone dose. Patient believes this is just a normal fluctuation and that the readings will level out back to normal soon.  I explained that with discontinuing the chlorthalidone this is less likely to occur.   She is hesitant to start another medications (would consider spironolactone or hydralazine), so instead we will try increasing her carvedilol from 6.25 mg to 9.375mg  twice daily.  She is worried that her heart rate will drop too low if we go to 12.5 mg dose.  We will see her again in about 6-8 weeks for follow up.  She was encouraged to work on a heart healthy diet and increase her exercise if she wants to avoid any further medications.

## 2018-05-05 DIAGNOSIS — H353112 Nonexudative age-related macular degeneration, right eye, intermediate dry stage: Secondary | ICD-10-CM | POA: Diagnosis not present

## 2018-05-06 DIAGNOSIS — H353121 Nonexudative age-related macular degeneration, left eye, early dry stage: Secondary | ICD-10-CM | POA: Diagnosis not present

## 2018-05-06 DIAGNOSIS — H35033 Hypertensive retinopathy, bilateral: Secondary | ICD-10-CM | POA: Diagnosis not present

## 2018-05-06 DIAGNOSIS — H25013 Cortical age-related cataract, bilateral: Secondary | ICD-10-CM | POA: Diagnosis not present

## 2018-05-06 DIAGNOSIS — H2513 Age-related nuclear cataract, bilateral: Secondary | ICD-10-CM | POA: Diagnosis not present

## 2018-05-06 DIAGNOSIS — H353112 Nonexudative age-related macular degeneration, right eye, intermediate dry stage: Secondary | ICD-10-CM | POA: Diagnosis not present

## 2018-05-31 DIAGNOSIS — M47816 Spondylosis without myelopathy or radiculopathy, lumbar region: Secondary | ICD-10-CM | POA: Diagnosis not present

## 2018-05-31 DIAGNOSIS — M48061 Spinal stenosis, lumbar region without neurogenic claudication: Secondary | ICD-10-CM | POA: Diagnosis not present

## 2018-05-31 DIAGNOSIS — M9903 Segmental and somatic dysfunction of lumbar region: Secondary | ICD-10-CM | POA: Diagnosis not present

## 2018-05-31 DIAGNOSIS — M9905 Segmental and somatic dysfunction of pelvic region: Secondary | ICD-10-CM | POA: Diagnosis not present

## 2018-06-04 DIAGNOSIS — M48061 Spinal stenosis, lumbar region without neurogenic claudication: Secondary | ICD-10-CM | POA: Diagnosis not present

## 2018-06-04 DIAGNOSIS — M47816 Spondylosis without myelopathy or radiculopathy, lumbar region: Secondary | ICD-10-CM | POA: Diagnosis not present

## 2018-06-04 DIAGNOSIS — M9903 Segmental and somatic dysfunction of lumbar region: Secondary | ICD-10-CM | POA: Diagnosis not present

## 2018-06-04 DIAGNOSIS — M9905 Segmental and somatic dysfunction of pelvic region: Secondary | ICD-10-CM | POA: Diagnosis not present

## 2018-06-04 DIAGNOSIS — H353112 Nonexudative age-related macular degeneration, right eye, intermediate dry stage: Secondary | ICD-10-CM | POA: Diagnosis not present

## 2018-06-07 ENCOUNTER — Other Ambulatory Visit (INDEPENDENT_AMBULATORY_CARE_PROVIDER_SITE_OTHER): Payer: Medicare Other

## 2018-06-07 DIAGNOSIS — R5383 Other fatigue: Secondary | ICD-10-CM

## 2018-06-07 DIAGNOSIS — M85851 Other specified disorders of bone density and structure, right thigh: Secondary | ICD-10-CM

## 2018-06-07 DIAGNOSIS — R7303 Prediabetes: Secondary | ICD-10-CM

## 2018-06-07 DIAGNOSIS — R7989 Other specified abnormal findings of blood chemistry: Secondary | ICD-10-CM | POA: Diagnosis not present

## 2018-06-07 LAB — COMPREHENSIVE METABOLIC PANEL
ALT: 13 U/L (ref 0–35)
AST: 18 U/L (ref 0–37)
Albumin: 4.4 g/dL (ref 3.5–5.2)
Alkaline Phosphatase: 33 U/L — ABNORMAL LOW (ref 39–117)
BUN: 19 mg/dL (ref 6–23)
CO2: 27 mEq/L (ref 19–32)
Calcium: 10.3 mg/dL (ref 8.4–10.5)
Chloride: 102 mEq/L (ref 96–112)
Creatinine, Ser: 0.76 mg/dL (ref 0.40–1.20)
GFR: 74.12 mL/min (ref >60.00)
Glucose, Bld: 107 mg/dL — ABNORMAL HIGH (ref 70–99)
Potassium: 4.2 mEq/L (ref 3.5–5.1)
Sodium: 136 mEq/L (ref 135–145)
Total Bilirubin: 0.5 mg/dL (ref 0.2–1.2)
Total Protein: 7.7 g/dL (ref 6.0–8.3)

## 2018-06-07 LAB — HEMOGLOBIN A1C: Hgb A1c MFr Bld: 5.9 % (ref 4.6–6.5)

## 2018-06-07 LAB — VITAMIN D 25 HYDROXY (VIT D DEFICIENCY, FRACTURES): VITD: 66.77 ng/mL (ref 30.00–100.00)

## 2018-06-07 LAB — TSH: TSH: 5.17 u[IU]/mL — ABNORMAL HIGH (ref 0.35–4.50)

## 2018-06-08 ENCOUNTER — Ambulatory Visit (INDEPENDENT_AMBULATORY_CARE_PROVIDER_SITE_OTHER): Payer: Medicare Other | Admitting: Pharmacist Clinician (PhC)/ Clinical Pharmacy Specialist

## 2018-06-08 DIAGNOSIS — I10 Essential (primary) hypertension: Secondary | ICD-10-CM | POA: Diagnosis not present

## 2018-06-08 NOTE — Progress Notes (Signed)
Patient ID: KADAJAH KJOS                 DOB: 1941/04/09                      MRN: [id number]     HPI: Brittany Figueroa is a 78 y.o. female referred by Dr. Gwenlyn Found to HTN clinic. PMH includes atypical angina, dyslipidemia, CAD, hypertension, and pre-diabetes.  We have been working with her for some time. Previously we had decreased her chlorthalidone to 1/2 tablet every other day, then discontinued, as she noted increased incontinence when taking it.  When that was discontinued, we increased the carvedilol to 9.375 mg bid to compensate.    Today she returns for follow up.  She describes an ongoing sciatic nerve pain that has been bothering her lately.  Lists the pain as worse in the early mornings when she gets up, but lessening later in the day.  She is currently seeking treatment for this.  Unfortunately she also takes her home BP readings in the early morning, which is probably the cause of some of the elevated readings.  Also of note, she just recently had labs from her PCP and they showed an increased TSH.    Current HTN meds:  Carvedilol 9.375 mg bid Irbesartan 150mg  twice daily  Previously tried:  Amlodipine - itching Chlorthalidone - urinary incontinence  BP goal: 130/80  Family History: mother and multiple siblings with hyperlipidemia, hypertension; knows little of father's family  Social History: former smoker, 12 glasses of wine per week  Diet: uses some salt, but only on specific items; lean meats, vegetables; no fried foods, low carbohydrates  Home BP readings: has been taking multiple daily readings, however because her pain levels are quite high at the moment, I am not sure of the value of these.  Range was 481-856 systolic with no pattern to high or normal readings.  Her two home cuffs were either higher or lower by about 10 points when compared to the office reading.    Wt Readings from Last 3 Encounters:  03/25/18 137 lb (62.1 kg)  12/07/17 134 lb 12.8 oz (61.1 kg)    09/29/17 132 lb (59.9 kg)   BP Readings from Last 3 Encounters:  06/08/18 138/70  04/22/18 (!) 142/76  03/25/18 128/64   Pulse Readings from Last 3 Encounters:  06/08/18 63  04/22/18 76  03/25/18 70    Past Medical History:  Diagnosis Date  . Abnormal finding on EKG, new anterolateral T-wave inversions  05/14/2012  . Abnormal nuclear stress test, with infero-lateral ischemia 05/14/2012  . Arthritis    "right thumb; all my fingers" (05/13/2012), cervical spondylosis   . Atypical angina ([place]) 05/14/2012  . CAD (coronary artery disease), 05/13/12, with 80% LAD 05/14/2012  . Cancer ([place])    basal cell on facial in L temporal region    . CMC arthritis, thumb, degenerative   . Complication of anesthesia 12/2009   "had endoscopy; larynx went into spasms; stopped breathing for 15 seconds" (05/13/2012)  . GERD (gastroesophageal reflux disease) 2011  . Hypercholesteremia   . Hypertension   . S/P angioplasty with stent, LAD 05/13/12 05/14/2012  . Tuberculosis    test +- [place] [facility] - Dr. [name]- CXR- OK    Current Outpatient Medications on File Prior to Visit  Medication Sig Dispense Refill  . acetaminophen (TYLENOL) 500 MG tablet Take 500 mg by mouth every 6 (six) hours as needed for mild  pain or headache.    Marland Kitchen aspirin 81 MG tablet Take 81 mg by mouth daily.    Marland Kitchen atorvastatin (LIPITOR) 10 MG tablet TAKE 1 TABLET(10 MG) BY MOUTH DAILY 90 tablet 4  . calcium-vitamin D (OSCAL WITH D) 500-200 MG-UNIT per tablet Take 2 tablets by mouth daily.    . carvedilol (COREG) 6.25 MG tablet Take 1.5 tablets (9.375 mg total) by mouth 2 (two) times daily. 270 tablet 1  . Cholecalciferol (VITAMIN D3) 2000 UNITS TABS Take 2,000 Units by mouth 2 (two) times daily.    . clopidogrel (PLAVIX) 75 MG tablet TAKE 1 TABLET(75 MG) BY MOUTH DAILY 90 tablet 3  . diclofenac sodium (VOLTAREN) 1 % GEL Apply 2 g topically daily as needed. For pain    . ezetimibe (ZETIA) 10 MG tablet TAKE 1 TABLET(10 MG) BY MOUTH  DAILY 30 tablet 6  . fenofibrate 160 MG tablet TAKE 1 TABLET(160 MG) BY MOUTH DAILY 90 tablet 4  . glucose blood (ONETOUCH VERIO) test strip Use to test blood sugar once daily Dx code E11.9 30 each 4  . hydrocortisone valerate cream (WESTCORT) 0.2 % Apply 1 application topically daily as needed (ITCHING).    Marland Kitchen irbesartan (AVAPRO) 300 MG tablet Take 0.5 tablets (150 mg total) by mouth 2 (two) times daily. 30 tablet 6  . metFORMIN (GLUCOPHAGE-XR) 500 MG 24 hr tablet TAKE 2 TABLETS(1000 MG) BY MOUTH DAILY WITH SUPPER 180 tablet 1  . Multiple Vitamin (MULTIVITAMIN WITH MINERALS) TABS Take 1 tablet by mouth daily.    . nitroGLYCERIN (NITROSTAT) 0.4 MG SL tablet PLACE 1 TABLET UNDER THE TONGUE EVERY 5 MINUTES AS NEEDED FOR CHEST PAIN 25 tablet 3  . chlorthalidone (HYGROTON) 25 MG tablet Take 0.5 tablets (12.5 mg total) by mouth every other day. (Patient not taking: Reported on 06/08/2018) 15 tablet 3   No current facility-administered medications on file prior to visit.     Allergies  Allergen Reactions  . Statins Other (See Comments)    "intermittent loss of circulation; hands and arms will go to sleep; feet will get cramps; fatigue" (05/13/2012).  This occurred with Lipitor, Zocor, Crestor 5 mg qd, and she thinks pravastatin as well  . Amlodipine Itching  . Gadolinium Derivatives Itching and Nausea Only    Pt had severe nausea and itching on legs, neck and back, per dr [name] pt needs 13 hour prep before contrast in the future  . Metformin And Related Other (See Comments)    Abdominal bloating    Blood pressure 138/70, pulse 63, resp. rate 16.  Essential hypertension Patient with essential hypertension, not able to tolerate multiple medications.   Her current office BP reading is about as good as we've seen for her, but her home readings continue to be elevated.  I suspect that much of that has to do with her pain levels and the differences in her home cuffs.  I have asked her to not take her  BP in the mornings, but wait until 12-1 pm when her pain levels are less.  She will continue with the carvedilol and irbesartan, seek continued treatment for her back pain, and we will see her again in about 2 months.     Tommy Medal PharmD CPP [facility] [address] [place] [address] 06/09/2018 8:18 AM

## 2018-06-08 NOTE — Patient Instructions (Addendum)
Return for a a follow up appointment in 2 months  Your blood pressure today is 138/70  Check your blood pressure at home daily (if able) and keep record of the readings.  Use only the Omron cuff, check once, not 3 times, and check only at lunch and bedtime.  Do not use the wrist cuff.    Take your BP meds as follows:   Continue with all current medications  Bring all of your meds, your BP cuff and your record of home blood pressures to your next appointment.  Exercise as you're able, try to walk approximately 30 minutes per day.  Keep salt intake to a minimum, especially watch canned and prepared boxed foods.  Eat more fresh fruits and vegetables and fewer canned items.  Avoid eating in fast food restaurants.    HOW TO TAKE YOUR BLOOD PRESSURE: . Rest 5 minutes before taking your blood pressure. .  Don't smoke or drink caffeinated beverages for at least 30 minutes before. . Take your blood pressure before (not after) you eat. . Sit comfortably with your back supported and both feet on the floor (don't cross your legs). . Elevate your arm to heart level on a table or a desk. . Use the proper sized cuff. It should fit smoothly and snugly around your bare upper arm. There should be enough room to slip a fingertip under the cuff. The bottom edge of the cuff should be 1 inch above the crease of the elbow. . Ideally, take 3 measurements at one sitting and record the average.

## 2018-06-09 DIAGNOSIS — M9905 Segmental and somatic dysfunction of pelvic region: Secondary | ICD-10-CM | POA: Diagnosis not present

## 2018-06-09 DIAGNOSIS — M47816 Spondylosis without myelopathy or radiculopathy, lumbar region: Secondary | ICD-10-CM | POA: Diagnosis not present

## 2018-06-09 DIAGNOSIS — M48061 Spinal stenosis, lumbar region without neurogenic claudication: Secondary | ICD-10-CM | POA: Diagnosis not present

## 2018-06-09 DIAGNOSIS — M9903 Segmental and somatic dysfunction of lumbar region: Secondary | ICD-10-CM | POA: Diagnosis not present

## 2018-06-09 NOTE — Assessment & Plan Note (Signed)
Patient with essential hypertension, not able to tolerate multiple medications.   Her current office BP reading is about as good as we've seen for her, but her home readings continue to be elevated.  I suspect that much of that has to do with her pain levels and the differences in her home cuffs.  I have asked her to not take her BP in the mornings, but wait until 12-1 pm when her pain levels are less.  She will continue with the carvedilol and irbesartan, seek continued treatment for her back pain, and we will see her again in about 2 months.

## 2018-06-10 ENCOUNTER — Ambulatory Visit (INDEPENDENT_AMBULATORY_CARE_PROVIDER_SITE_OTHER): Payer: Medicare Other | Admitting: Endocrinology

## 2018-06-10 ENCOUNTER — Encounter: Payer: Self-pay | Admitting: Endocrinology

## 2018-06-10 VITALS — BP 132/70 | HR 77 | Ht 61.0 in | Wt 137.4 lb

## 2018-06-10 DIAGNOSIS — R7303 Prediabetes: Secondary | ICD-10-CM | POA: Diagnosis not present

## 2018-06-10 DIAGNOSIS — E039 Hypothyroidism, unspecified: Secondary | ICD-10-CM

## 2018-06-10 MED ORDER — LEVOTHYROXINE SODIUM 25 MCG PO TABS: 25.0000 ug | ORAL_TABLET | Freq: Every day | ORAL | 3 refills | Status: DC

## 2018-06-10 NOTE — Progress Notes (Signed)
Patient ID: Brittany Figueroa, female   DOB: May 22, 1940, 78 y.o.   MRN: [id number]           Chief complaint: Endocrinology follow-up  History of Present Illness:  She was told by her PCP that she has type 2 diabetes based on an A1c of 6.5% in 07/2013 She has not had any other A1c is subsequently above normal, normal range and previous lab 6.2 or less She also has not had any abnormal blood sugars except a glucose of 125 in 3/16  She does have increased fasting blood sugars ranging from 108-125 since about 2012  She may have been told about 10 years ago that she had prediabetes and reportedly had a normal glucose tolerance test.  GLUCOSE TOLERANCE test on 05/23/15: Fasting glucose 110, two-hour glucose 192  RECENT history:   She has been intermittently on metformin ER since about 08/2015 In 1/18 when A1c was 6.5 and glucose 133 fasting she was told to start back on metformin and also improve her diet She was taking 1500 mg a day but could not tolerate this because of feeling of bloating  Current regimen: Metformin ER 500 mg twice a day with minimal bloating as side effect  A1c is improved at 5.9 compared to 6.1  Current management and blood sugars:    She has had fairly normal blood sugars done either in the mornings are after dinner  However because of her monitor having the time reversed evenings instead of mornings regard to assess her better  Highest blood sugar in the morning has been only 114  After meals but sugars are not increasing She has not been able to exercise because of back pain However she is again trying to watch her portions and carbohydrates Her weight is stable  Home readings using the [employer]   MORNING blood sugar range 91-114 EVENING range 92-119 AVERAGE 102  Weight history: Previous range 139-143  Wt Readings from Last 3 Encounters:  06/10/18 137 lb 6.4 oz (62.3 kg)  03/25/18 137 lb (62.1 kg)  12/07/17 134 lb 12.8 oz (61.1 kg)    Lab Results    Component Value Date   HGBA1C 5.9 06/07/2018   HGBA1C 6.1 11/18/2017   HGBA1C 6.1 06/23/2017   Lab Results  Component Value Date   LDLCALC 93 02/17/2017   CREATININE 0.76 06/07/2018   OSTEOPENIA: See review of systems    Lab on 06/07/2018  Component Date Value Ref Range Status  . VITD 06/07/2018 66.77  30.00 - 100.00 ng/mL Final  . TSH 06/07/2018 5.17* 0.35 - 4.50 uIU/mL Final  . Sodium 06/07/2018 136  135 - 145 mEq/L Final  . Potassium 06/07/2018 4.2  3.5 - 5.1 mEq/L Final  . Chloride 06/07/2018 102  96 - 112 mEq/L Final  . CO2 06/07/2018 27  19 - 32 mEq/L Final  . Glucose, Bld 06/07/2018 107* 70 - 99 mg/dL Final  . BUN 06/07/2018 19  6 - 23 mg/dL Final  . Creatinine, Ser 06/07/2018 0.76  0.40 - 1.20 mg/dL Final  . Total Bilirubin 06/07/2018 0.5  0.2 - 1.2 mg/dL Final  . Alkaline Phosphatase 06/07/2018 33* 39 - 117 U/L Final  . AST 06/07/2018 18  0 - 37 U/L Final  . ALT 06/07/2018 13  0 - 35 U/L Final  . Total Protein 06/07/2018 7.7  6.0 - 8.3 g/dL Final  . Albumin 06/07/2018 4.4  3.5 - 5.2 g/dL Final  . Calcium 06/07/2018 10.3  8.4 - 10.5  mg/dL Final  . GFR 06/07/2018 74.12  >60.00 mL/min Final  . Hgb A1c MFr Bld 06/07/2018 5.9  4.6 - 6.5 % Final   Glycemic Control Guidelines for People with Diabetes:Non Diabetic:  <6%Goal of Therapy: <7%Additional Action Suggested:  >8%       Past Medical History:  Diagnosis Date  . Abnormal finding on EKG, new anterolateral T-wave inversions  05/14/2012  . Abnormal nuclear stress test, with infero-lateral ischemia 05/14/2012  . Arthritis    "right thumb; all my fingers" (05/13/2012), cervical spondylosis   . Atypical angina ([place]) 05/14/2012  . CAD (coronary artery disease), 05/13/12, with 80% LAD 05/14/2012  . Cancer ([place])    basal cell on facial in L temporal region    . [facility] arthritis, thumb, degenerative   . Complication of anesthesia 12/2009   "had endoscopy; larynx went into spasms; stopped breathing for 15 seconds"  (05/13/2012)  . GERD (gastroesophageal reflux disease) 2011  . Hypercholesteremia   . Hypertension   . S/P angioplasty with stent, LAD 05/13/12 05/14/2012  . Tuberculosis    test +- [facility] [facility] - Dr. [name]- CXR- OK    Past Surgical History:  Procedure Laterality Date  . ANTERIOR CERVICAL DECOMP/DISCECTOMY FUSION N/A 12/13/2013   Procedure: ANTERIOR CERVICAL DECOMPRESSION/DISCECTOMY FUSION 3 LEVELS Cervical four/five,five/six,six/seven. Anterior cervical disectomy and fusion with peek and plate ;  Surgeon: [name], MD;  Location: [facility];  Service: Neurosurgery;  Laterality: N/A;  . CARDIAC CATHETERIZATION N/A 07/02/2015   Procedure: Left Heart Cath and Coronary Angiography;  Surgeon: [name], MD;  Location: [facility];  Service: Cardiovascular;  Laterality: N/A;  . CORONARY ANGIOPLASTY WITH STENT PLACEMENT  05/13/2012   DES to LAD; she has total RCA with left to rt coll. normal LV function done for positive nuc study  . DILATION AND CURETTAGE OF UTERUS  1960's; 1970's; 1980   "probably 3" (05/13/2012)  . FOREARM / WRIST TENDON LESION EXCISION  ?11/2001   "right; did waver to try to get ulnar out of hole that it had cut" (05/13/2012  . INGUINAL HERNIA REPAIR  ~ 1966; 07/21/2002   "right; left" (05/13/2012)  . LEFT HEART CATHETERIZATION WITH CORONARY ANGIOGRAM N/A 05/13/2012   Procedure: LEFT HEART CATHETERIZATION WITH CORONARY ANGIOGRAM;  Surgeon: [name], MD;  Location: [facility];  Service: Cardiovascular;  Laterality: N/A;  . OSTEOTOMY AND ULNAR SHORTENING  07/21/2002   "right" (05/13/2012)  . TONSILLECTOMY AND ADENOIDECTOMY  1950's  . TUBAL LIGATION  1980    Family History  Problem Relation Age of Onset  . Hypertension Mother   . Diabetes Mother   . Hyperlipidemia Father   . Heart disease Father   . Stroke Father   . Heart disease Sister   . Diabetes Sister   . Heart disease Brother   . Diabetes Maternal Aunt     Social History:  reports  that she quit smoking about 29 years ago. Her smoking use included cigarettes. She has a 10.00 pack-year smoking history. She has never used smokeless tobacco. She reports current alcohol use of about 12.0 standard drinks of alcohol per week. She reports that she does not use drugs.  Allergies:  Allergies  Allergen Reactions  . Statins Other (See Comments)    "intermittent loss of circulation; hands and arms will go to sleep; feet will get cramps; fatigue" (05/13/2012).  This occurred with Lipitor, Zocor, Crestor 5 mg qd, and she thinks pravastatin as well  .  Amlodipine Itching  . Gadolinium Derivatives Itching and Nausea Only    Pt had severe nausea and itching on legs, neck and back, per dr [name] pt needs 13 hour prep before contrast in the future  . Metformin And Related Other (See Comments)    Abdominal bloating    Allergies as of 06/10/2018      Reactions   Statins Other (See Comments)   "intermittent loss of circulation; hands and arms will go to sleep; feet will get cramps; fatigue" (05/13/2012).  This occurred with Lipitor, Zocor, Crestor 5 mg qd, and she thinks pravastatin as well   Amlodipine Itching   Gadolinium Derivatives Itching, Nausea Only   Pt had severe nausea and itching on legs, neck and back, per dr [name] pt needs 13 hour prep before contrast in the future   Metformin And Related Other (See Comments)   Abdominal bloating      Medication List       Accurate as of June 10, 2018  1:44 PM. Always use your most recent med list.        acetaminophen 500 MG tablet Commonly known as:  TYLENOL Take 500 mg by mouth every 6 (six) hours as needed for mild pain or headache.   aspirin 81 MG tablet Take 81 mg by mouth daily.   atorvastatin 10 MG tablet Commonly known as:  LIPITOR TAKE 1 TABLET(10 MG) BY MOUTH DAILY   calcium-vitamin D 500-200 MG-UNIT tablet Commonly known as:  OSCAL WITH D Take 2 tablets by mouth daily.   carvedilol 6.25 MG tablet Commonly  known as:  COREG Take 1.5 tablets (9.375 mg total) by mouth 2 (two) times daily.   clopidogrel 75 MG tablet Commonly known as:  PLAVIX TAKE 1 TABLET(75 MG) BY MOUTH DAILY   diclofenac sodium 1 % Gel Commonly known as:  VOLTAREN Apply 2 g topically daily as needed. For pain   ezetimibe 10 MG tablet Commonly known as:  ZETIA TAKE 1 TABLET(10 MG) BY MOUTH DAILY   fenofibrate 160 MG tablet TAKE 1 TABLET(160 MG) BY MOUTH DAILY   glucose blood test strip Commonly known as:  ONETOUCH VERIO Use to test blood sugar once daily Dx code E11.9   hydrocortisone valerate cream 0.2 % Commonly known as:  WESTCORT Apply 1 application topically daily as needed (ITCHING).   irbesartan 300 MG tablet Commonly known as:  AVAPRO Take 0.5 tablets (150 mg total) by mouth 2 (two) times daily.   metFORMIN 500 MG 24 hr tablet Commonly known as:  GLUCOPHAGE-XR TAKE 2 TABLETS(1000 MG) BY MOUTH DAILY WITH SUPPER   multivitamin with minerals Tabs tablet Take 1 tablet by mouth daily.   nitroGLYCERIN 0.4 MG SL tablet Commonly known as:  NITROSTAT PLACE 1 TABLET UNDER THE TONGUE EVERY 5 MINUTES AS NEEDED FOR CHEST PAIN   Vitamin D3 50 MCG (2000 UT) Tabs Take 2,000 Units by mouth 2 (two) times daily.       LABS:  Lab on 06/07/2018  Component Date Value Ref Range Status  . VITD 06/07/2018 66.77  30.00 - 100.00 ng/mL Final  . TSH 06/07/2018 5.17* 0.35 - 4.50 uIU/mL Final  . Sodium 06/07/2018 136  135 - 145 mEq/L Final  . Potassium 06/07/2018 4.2  3.5 - 5.1 mEq/L Final  . Chloride 06/07/2018 102  96 - 112 mEq/L Final  . CO2 06/07/2018 27  19 - 32 mEq/L Final  . Glucose, Bld 06/07/2018 107* 70 - 99 mg/dL Final  . BUN 06/07/2018  19  6 - 23 mg/dL Final  . Creatinine, Ser 06/07/2018 0.76  0.40 - 1.20 mg/dL Final  . Total Bilirubin 06/07/2018 0.5  0.2 - 1.2 mg/dL Final  . Alkaline Phosphatase 06/07/2018 33* 39 - 117 U/L Final  . AST 06/07/2018 18  0 - 37 U/L Final  . ALT 06/07/2018 13  0 - 35 U/L  Final  . Total Protein 06/07/2018 7.7  6.0 - 8.3 g/dL Final  . Albumin 06/07/2018 4.4  3.5 - 5.2 g/dL Final  . Calcium 06/07/2018 10.3  8.4 - 10.5 mg/dL Final  . GFR 06/07/2018 74.12  >60.00 mL/min Final  . Hgb A1c MFr Bld 06/07/2018 5.9  4.6 - 6.5 % Final   Glycemic Control Guidelines for People with Diabetes:Non Diabetic:  <6%Goal of Therapy: <7%Additional Action Suggested:  >8%       Review of Systems    OSTEOPENIA: She had a follow-up bone density in June 2019 but has not had a discussion with her PCP about this Lowest T score is at the right neck femur with a value of -2.4 compared to 2.0, 2 years previously and she has not been on treatment She does however say that she had tried Fosamax in the past and this caused some GI side effects and does not want to try it again   She received RECLAST infusion on 12/15/17   Other active medical issues followed by various physicians:  Hypertension, treated with Avapro, chlorthalidone half tablet and Coreg, Followed by PCP   History of hyperlipidemia with some intolerance to statins, on Fenofibrate, 10 mg Lipitor and also on Zetia.  This is followed by cardiologist in the lipid clinic  Last LDL 95  Lab Results  Component Value Date   CHOL 161 02/17/2017   HDL 42.40 02/17/2017   LDLCALC 93 02/17/2017   LDLDIRECT 130.1 04/04/2014   TRIG 127.0 02/17/2017   CHOLHDL 4 02/17/2017    ?  HYPOTHYROIDISM: TSH previously highest 5.6  Although TSH was better previously it is now up just over 5 She thinks she is having some fatigue, constipation, dry skin and difficulty losing weight.  May also have mild cold intolerance  Lab Results  Component Value Date   TSH 5.17 (H) 06/07/2018   TSH 4.22 06/23/2017   TSH 3.84 07/18/2016   FREET4 1.03 06/03/2016      PHYSICAL EXAM:  BP 132/70 (BP Location: Left Arm, Patient Position: Sitting, Cuff Size: Normal)   Pulse 77   Ht 5\' 1"  (1.549 m)   Wt 137 lb 6.4 oz (62.3 kg)   SpO2 98%   BMI  25.96 kg/m       ASSESSMENT:   Prediabetes with impaired fasting glucose and also impaired glucose tolerance Highest A1c previously 6.5 and now 5.9.   Previously highest fasting glucose 133  She is on metformin 500 mg twice daily and tolerating this well now  She is doing well with lifestyle changes although not able to exercise currently With her A1c improving she does not appear to have any progression  OSTEOPENIA: She will get Reclast every 2 years  HYPOTHYROIDISM: Although this is likely to be subclinical and she thinks she is symptomatic and appears to have some symptoms of hypothyroidism although this may not be new   PLAN:   Continue metformin unchanged  Restart exercise when able to  Trial of levothyroxine 25 mcg daily.  Discussed that if she is not subjectively better by next visit significantly then we can stop  it  Follow-up in 2 months    Elayne Snare 06/10/2018, 1:44 PM

## 2018-06-15 DIAGNOSIS — M545 Low back pain: Secondary | ICD-10-CM | POA: Diagnosis not present

## 2018-06-15 DIAGNOSIS — M47816 Spondylosis without myelopathy or radiculopathy, lumbar region: Secondary | ICD-10-CM | POA: Diagnosis not present

## 2018-06-15 DIAGNOSIS — M9905 Segmental and somatic dysfunction of pelvic region: Secondary | ICD-10-CM | POA: Diagnosis not present

## 2018-06-15 DIAGNOSIS — M48061 Spinal stenosis, lumbar region without neurogenic claudication: Secondary | ICD-10-CM | POA: Diagnosis not present

## 2018-06-15 DIAGNOSIS — M5440 Lumbago with sciatica, unspecified side: Secondary | ICD-10-CM | POA: Diagnosis not present

## 2018-06-15 DIAGNOSIS — M9903 Segmental and somatic dysfunction of lumbar region: Secondary | ICD-10-CM | POA: Diagnosis not present

## 2018-06-18 DIAGNOSIS — M48061 Spinal stenosis, lumbar region without neurogenic claudication: Secondary | ICD-10-CM | POA: Diagnosis not present

## 2018-06-18 DIAGNOSIS — M5440 Lumbago with sciatica, unspecified side: Secondary | ICD-10-CM | POA: Diagnosis not present

## 2018-06-18 DIAGNOSIS — M545 Low back pain: Secondary | ICD-10-CM | POA: Diagnosis not present

## 2018-06-21 DIAGNOSIS — M545 Low back pain: Secondary | ICD-10-CM | POA: Diagnosis not present

## 2018-06-21 DIAGNOSIS — M5440 Lumbago with sciatica, unspecified side: Secondary | ICD-10-CM | POA: Diagnosis not present

## 2018-06-21 DIAGNOSIS — M48061 Spinal stenosis, lumbar region without neurogenic claudication: Secondary | ICD-10-CM | POA: Diagnosis not present

## 2018-06-22 DIAGNOSIS — M48061 Spinal stenosis, lumbar region without neurogenic claudication: Secondary | ICD-10-CM | POA: Diagnosis not present

## 2018-06-22 DIAGNOSIS — M47816 Spondylosis without myelopathy or radiculopathy, lumbar region: Secondary | ICD-10-CM | POA: Diagnosis not present

## 2018-06-22 DIAGNOSIS — M9905 Segmental and somatic dysfunction of pelvic region: Secondary | ICD-10-CM | POA: Diagnosis not present

## 2018-06-22 DIAGNOSIS — M9903 Segmental and somatic dysfunction of lumbar region: Secondary | ICD-10-CM | POA: Diagnosis not present

## 2018-06-23 DIAGNOSIS — M5440 Lumbago with sciatica, unspecified side: Secondary | ICD-10-CM | POA: Diagnosis not present

## 2018-06-23 DIAGNOSIS — M48061 Spinal stenosis, lumbar region without neurogenic claudication: Secondary | ICD-10-CM | POA: Diagnosis not present

## 2018-06-23 DIAGNOSIS — M545 Low back pain: Secondary | ICD-10-CM | POA: Diagnosis not present

## 2018-06-28 DIAGNOSIS — M48061 Spinal stenosis, lumbar region without neurogenic claudication: Secondary | ICD-10-CM | POA: Diagnosis not present

## 2018-06-28 DIAGNOSIS — M5440 Lumbago with sciatica, unspecified side: Secondary | ICD-10-CM | POA: Diagnosis not present

## 2018-06-28 DIAGNOSIS — M545 Low back pain: Secondary | ICD-10-CM | POA: Diagnosis not present

## 2018-07-01 ENCOUNTER — Other Ambulatory Visit: Payer: Self-pay | Admitting: Cardiovascular Disease

## 2018-07-01 DIAGNOSIS — L570 Actinic keratosis: Secondary | ICD-10-CM | POA: Diagnosis not present

## 2018-07-01 DIAGNOSIS — D1801 Hemangioma of skin and subcutaneous tissue: Secondary | ICD-10-CM | POA: Diagnosis not present

## 2018-07-01 DIAGNOSIS — L821 Other seborrheic keratosis: Secondary | ICD-10-CM | POA: Diagnosis not present

## 2018-07-01 DIAGNOSIS — Z85828 Personal history of other malignant neoplasm of skin: Secondary | ICD-10-CM | POA: Diagnosis not present

## 2018-07-01 DIAGNOSIS — D225 Melanocytic nevi of trunk: Secondary | ICD-10-CM | POA: Diagnosis not present

## 2018-07-01 DIAGNOSIS — L820 Inflamed seborrheic keratosis: Secondary | ICD-10-CM | POA: Diagnosis not present

## 2018-07-01 DIAGNOSIS — L814 Other melanin hyperpigmentation: Secondary | ICD-10-CM | POA: Diagnosis not present

## 2018-07-01 DIAGNOSIS — M48061 Spinal stenosis, lumbar region without neurogenic claudication: Secondary | ICD-10-CM | POA: Diagnosis not present

## 2018-07-01 DIAGNOSIS — M5440 Lumbago with sciatica, unspecified side: Secondary | ICD-10-CM | POA: Diagnosis not present

## 2018-07-01 DIAGNOSIS — M545 Low back pain: Secondary | ICD-10-CM | POA: Diagnosis not present

## 2018-07-04 DIAGNOSIS — H353112 Nonexudative age-related macular degeneration, right eye, intermediate dry stage: Secondary | ICD-10-CM | POA: Diagnosis not present

## 2018-07-05 DIAGNOSIS — M47816 Spondylosis without myelopathy or radiculopathy, lumbar region: Secondary | ICD-10-CM | POA: Diagnosis not present

## 2018-07-05 DIAGNOSIS — M9905 Segmental and somatic dysfunction of pelvic region: Secondary | ICD-10-CM | POA: Diagnosis not present

## 2018-07-05 DIAGNOSIS — M9903 Segmental and somatic dysfunction of lumbar region: Secondary | ICD-10-CM | POA: Diagnosis not present

## 2018-07-05 DIAGNOSIS — M48061 Spinal stenosis, lumbar region without neurogenic claudication: Secondary | ICD-10-CM | POA: Diagnosis not present

## 2018-07-07 DIAGNOSIS — Z1389 Encounter for screening for other disorder: Secondary | ICD-10-CM | POA: Diagnosis not present

## 2018-07-07 DIAGNOSIS — Z23 Encounter for immunization: Secondary | ICD-10-CM | POA: Diagnosis not present

## 2018-07-07 DIAGNOSIS — R7309 Other abnormal glucose: Secondary | ICD-10-CM | POA: Diagnosis not present

## 2018-07-07 DIAGNOSIS — Z0000 Encounter for general adult medical examination without abnormal findings: Secondary | ICD-10-CM | POA: Diagnosis not present

## 2018-07-07 DIAGNOSIS — Z79899 Other long term (current) drug therapy: Secondary | ICD-10-CM | POA: Diagnosis not present

## 2018-07-07 DIAGNOSIS — I10 Essential (primary) hypertension: Secondary | ICD-10-CM | POA: Diagnosis not present

## 2018-07-07 DIAGNOSIS — E782 Mixed hyperlipidemia: Secondary | ICD-10-CM | POA: Diagnosis not present

## 2018-07-08 DIAGNOSIS — M5440 Lumbago with sciatica, unspecified side: Secondary | ICD-10-CM | POA: Diagnosis not present

## 2018-07-08 DIAGNOSIS — M545 Low back pain: Secondary | ICD-10-CM | POA: Diagnosis not present

## 2018-07-08 DIAGNOSIS — M48061 Spinal stenosis, lumbar region without neurogenic claudication: Secondary | ICD-10-CM | POA: Diagnosis not present

## 2018-07-09 ENCOUNTER — Telehealth: Payer: Self-pay | Admitting: Cardiovascular Disease

## 2018-07-09 MED ORDER — HYDRALAZINE HCL 10 MG PO TABS: ORAL_TABLET | ORAL | 4 refills | Status: DC

## 2018-07-09 NOTE — Telephone Encounter (Signed)
Follow up  ° ° °Patient is returning your call. °

## 2018-07-09 NOTE — Telephone Encounter (Signed)
Called patient, advised that she has seen Brittany Figueroa recently for HTN clinic, she states that she continues to have issues with BP, yesterday at PCP it was 146/80, and this morning the top number was 155 patient is unsure of the bottom number at this time. She states it has been running in the 150-160's for her top number. She would like to know if Brittany Figueroa has any recommendations.  Thank you!

## 2018-07-09 NOTE — Telephone Encounter (Signed)
Patient still having problems with BP readings, still many readings > 150.    Advised she start hydralazine 10 mg bid prn SBP > 150.  Patient seems to be overly sensitive to medications, so will start with lod dose and see if she tolerates.    Patient voiced understanding

## 2018-07-09 NOTE — Telephone Encounter (Signed)
New Message:    Patient has been taking her BP top number is still hight. Patient went to her primary on yesterday and stated that her top number of BP is high. Please call patient back.

## 2018-07-09 NOTE — Telephone Encounter (Signed)
Attempted to contact patient, LVM to call back to discuss.   

## 2018-07-12 DIAGNOSIS — M5440 Lumbago with sciatica, unspecified side: Secondary | ICD-10-CM | POA: Diagnosis not present

## 2018-07-12 DIAGNOSIS — M545 Low back pain: Secondary | ICD-10-CM | POA: Diagnosis not present

## 2018-07-12 DIAGNOSIS — M48061 Spinal stenosis, lumbar region without neurogenic claudication: Secondary | ICD-10-CM | POA: Diagnosis not present

## 2018-07-15 DIAGNOSIS — M545 Low back pain: Secondary | ICD-10-CM | POA: Diagnosis not present

## 2018-07-15 DIAGNOSIS — M48061 Spinal stenosis, lumbar region without neurogenic claudication: Secondary | ICD-10-CM | POA: Diagnosis not present

## 2018-07-15 DIAGNOSIS — M5440 Lumbago with sciatica, unspecified side: Secondary | ICD-10-CM | POA: Diagnosis not present

## 2018-07-19 DIAGNOSIS — M9905 Segmental and somatic dysfunction of pelvic region: Secondary | ICD-10-CM | POA: Diagnosis not present

## 2018-07-19 DIAGNOSIS — M48061 Spinal stenosis, lumbar region without neurogenic claudication: Secondary | ICD-10-CM | POA: Diagnosis not present

## 2018-07-19 DIAGNOSIS — M9903 Segmental and somatic dysfunction of lumbar region: Secondary | ICD-10-CM | POA: Diagnosis not present

## 2018-07-19 DIAGNOSIS — M47816 Spondylosis without myelopathy or radiculopathy, lumbar region: Secondary | ICD-10-CM | POA: Diagnosis not present

## 2018-07-23 DIAGNOSIS — M545 Low back pain: Secondary | ICD-10-CM | POA: Diagnosis not present

## 2018-07-23 DIAGNOSIS — M5440 Lumbago with sciatica, unspecified side: Secondary | ICD-10-CM | POA: Diagnosis not present

## 2018-07-23 DIAGNOSIS — M48061 Spinal stenosis, lumbar region without neurogenic claudication: Secondary | ICD-10-CM | POA: Diagnosis not present

## 2018-07-27 DIAGNOSIS — M5440 Lumbago with sciatica, unspecified side: Secondary | ICD-10-CM | POA: Diagnosis not present

## 2018-07-27 DIAGNOSIS — M545 Low back pain: Secondary | ICD-10-CM | POA: Diagnosis not present

## 2018-07-27 DIAGNOSIS — M48061 Spinal stenosis, lumbar region without neurogenic claudication: Secondary | ICD-10-CM | POA: Diagnosis not present

## 2018-07-29 DIAGNOSIS — M545 Low back pain: Secondary | ICD-10-CM | POA: Diagnosis not present

## 2018-07-29 DIAGNOSIS — M5440 Lumbago with sciatica, unspecified side: Secondary | ICD-10-CM | POA: Diagnosis not present

## 2018-07-29 DIAGNOSIS — M48061 Spinal stenosis, lumbar region without neurogenic claudication: Secondary | ICD-10-CM | POA: Diagnosis not present

## 2018-08-03 ENCOUNTER — Telehealth: Payer: Self-pay

## 2018-08-03 DIAGNOSIS — H353112 Nonexudative age-related macular degeneration, right eye, intermediate dry stage: Secondary | ICD-10-CM | POA: Diagnosis not present

## 2018-08-03 MED ORDER — CHLORTHALIDONE 25 MG PO TABS: 12.5000 mg | ORAL_TABLET | ORAL | 3 refills | Status: DC

## 2018-08-03 NOTE — Telephone Encounter (Signed)
Patient is concerned that her appointment needs to be moved out a month due to COVID-19.  She worries because home readings are as high as 161 systolic.  Doesn't like taking carvedilol 9.375 bid or the hydralazine prn systolic > 096.  States she feels too tired with these.  Would like to go back on chlorthalidone.  In the past we had to use only chlorthalidone 12.5 mg qod, as she had frequent urination and hyponatremia with higher doses.    She is aware of our concern for hyponatremia and agrees to go to a [facility] near her house about 10 days after starting the medication.  She still has tablet from her previous use and will start with those.  She agrees to send any further messages thru MyChart to be sure we are able to get the message.

## 2018-08-03 NOTE — Telephone Encounter (Signed)
Patient requesting a call back from pharmd

## 2018-08-05 ENCOUNTER — Ambulatory Visit: Payer: Medicare Other

## 2018-08-06 DIAGNOSIS — M545 Low back pain: Secondary | ICD-10-CM | POA: Diagnosis not present

## 2018-08-06 DIAGNOSIS — M48061 Spinal stenosis, lumbar region without neurogenic claudication: Secondary | ICD-10-CM | POA: Diagnosis not present

## 2018-08-06 DIAGNOSIS — M5440 Lumbago with sciatica, unspecified side: Secondary | ICD-10-CM | POA: Diagnosis not present

## 2018-08-09 ENCOUNTER — Other Ambulatory Visit (INDEPENDENT_AMBULATORY_CARE_PROVIDER_SITE_OTHER): Payer: Medicare Other

## 2018-08-09 ENCOUNTER — Other Ambulatory Visit: Payer: Self-pay

## 2018-08-09 DIAGNOSIS — R7303 Prediabetes: Secondary | ICD-10-CM

## 2018-08-09 DIAGNOSIS — E039 Hypothyroidism, unspecified: Secondary | ICD-10-CM | POA: Diagnosis not present

## 2018-08-09 LAB — GLUCOSE, RANDOM: Glucose, Bld: 117 mg/dL — ABNORMAL HIGH (ref 70–99)

## 2018-08-09 LAB — TSH: TSH: 3.65 u[IU]/mL (ref 0.35–4.50)

## 2018-08-09 LAB — T4, FREE: Free T4: 1.14 ng/dL (ref 0.60–1.60)

## 2018-08-10 ENCOUNTER — Other Ambulatory Visit: Payer: Self-pay

## 2018-08-11 ENCOUNTER — Other Ambulatory Visit: Payer: Self-pay

## 2018-08-11 ENCOUNTER — Encounter: Payer: Self-pay | Admitting: Endocrinology

## 2018-08-11 ENCOUNTER — Ambulatory Visit (INDEPENDENT_AMBULATORY_CARE_PROVIDER_SITE_OTHER): Payer: Medicare Other | Admitting: Endocrinology

## 2018-08-11 VITALS — BP 140/70 | HR 77 | Temp 97.8°F | Ht 61.0 in | Wt 137.2 lb

## 2018-08-11 DIAGNOSIS — E782 Mixed hyperlipidemia: Secondary | ICD-10-CM | POA: Diagnosis not present

## 2018-08-11 DIAGNOSIS — R7303 Prediabetes: Secondary | ICD-10-CM | POA: Diagnosis not present

## 2018-08-11 DIAGNOSIS — E039 Hypothyroidism, unspecified: Secondary | ICD-10-CM | POA: Diagnosis not present

## 2018-08-11 NOTE — Progress Notes (Signed)
Patient ID: Brittany Figueroa, female   DOB: [birth year], 78 y.o.   MRN: [id number]           Chief complaint: Endocrinology follow-up  History of Present Illness:  She was told by her PCP that she has type 2 diabetes based on an A1c of 6.5% in 07/2013 She has not had any other A1c is subsequently above normal, normal range and previous lab 6.2 or less She also has not had any abnormal blood sugars except a glucose of 125 in 3/16  She does have increased fasting blood sugars ranging from 108-125 since about 2012  She may have been told about 10 years ago that she had prediabetes and reportedly had a normal glucose tolerance test.  GLUCOSE TOLERANCE test on 05/23/15: Fasting glucose 110, two-hour glucose 192  RECENT history:   She has been intermittently on metformin ER since about 08/2015 In 1/18 when A1c was 6.5 and glucose 133 fasting she was told to start back on metformin and also improve her diet She was taking 1500 mg a day but could not tolerate this because of feeling of bloating  Current regimen: Metformin ER 500 mg twice a day with minimal bloating as side effect  A1c in 1/20 is improved at 5.9 compared to 6.1  Current management and blood sugars:    Her blood sugars are looking fairly good at home with FASTING blood sugars ranging from 96 up to 109  Blood sugars after lunch range from 69-161 but generally below 125  Lab glucose done midday was 117 She has not been able to exercise because of back pain and sciatica Her weight is stable  Home readings using the FreeStyle   Average blood sugar for the last 30 days was 107  Weight history: Previous range 139-143  Wt Readings from Last 3 Encounters:  08/11/18 137 lb 3.2 oz (62.2 kg)  06/10/18 137 lb 6.4 oz (62.3 kg)  03/25/18 137 lb (62.1 kg)    Lab Results  Component Value Date   HGBA1C 5.9 06/07/2018   HGBA1C 6.1 11/18/2017   HGBA1C 6.1 06/23/2017   Lab Results  Component Value Date   LDLCALC 93 02/17/2017   CREATININE 0.76 06/07/2018   HYPOTHYROIDISM and other problems reviewed today: See review of systems    Lab on 08/09/2018  Component Date Value Ref Range Status  . Glucose, Bld 08/09/2018 117* 70 - 99 mg/dL Final  . TSH 08/09/2018 3.65  0.35 - 4.50 uIU/mL Final  . Free T4 08/09/2018 1.14  0.60 - 1.60 ng/dL Final   Comment: Specimens from patients who are undergoing biotin therapy and /or ingesting biotin supplements may contain high levels of biotin.  The higher biotin concentration in these specimens interferes with this Free T4 assay.  Specimens that contain high levels  of biotin may cause false high results for this Free T4 assay.  Please interpret results in light of the total clinical presentation of the patient.        Past Medical History:  Diagnosis Date  . Abnormal finding on EKG, new anterolateral T-wave inversions  05/14/2012  . Abnormal nuclear stress test, with infero-lateral ischemia 05/14/2012  . Arthritis    "right thumb; all my fingers" (05/13/2012), cervical spondylosis   . Atypical angina ([place]) 05/14/2012  . CAD (coronary artery disease), 05/13/12, with 80% LAD 05/14/2012  . Cancer ([place])    basal cell on facial in L temporal region    . CMC arthritis, thumb, degenerative   .  Complication of anesthesia 12/2009   "had endoscopy; larynx went into spasms; stopped breathing for 15 seconds" (05/13/2012)  . GERD (gastroesophageal reflux disease) 2011  . Hypercholesteremia   . Hypertension   . S/P angioplasty with stent, LAD 05/13/12 05/14/2012  . Tuberculosis    test +- [facility] - Dr. [name]- CXR- OK    Past Surgical History:  Procedure Laterality Date  . ANTERIOR CERVICAL DECOMP/DISCECTOMY FUSION N/A 12/13/2013   Procedure: ANTERIOR CERVICAL DECOMPRESSION/DISCECTOMY FUSION 3 LEVELS Cervical four/five,five/six,six/seven. Anterior cervical disectomy and fusion with peek and plate ;  Surgeon: [name], MD;  Location: [facility];  Service: Neurosurgery;   Laterality: N/A;  . CARDIAC CATHETERIZATION N/A 07/02/2015   Procedure: Left Heart Cath and Coronary Angiography;  Surgeon: [name], MD;  Location: [facility];  Service: Cardiovascular;  Laterality: N/A;  . CORONARY ANGIOPLASTY WITH STENT PLACEMENT  05/13/2012   DES to LAD; she has total RCA with left to rt coll. normal LV function done for positive nuc study  . DILATION AND CURETTAGE OF UTERUS  1960's; 1970's; 1980   "probably 3" (05/13/2012)  . FOREARM / WRIST TENDON LESION EXCISION  ?11/2001   "right; did waver to try to get ulnar out of hole that it had cut" (05/13/2012  . INGUINAL HERNIA REPAIR  ~ 1966; 07/21/2002   "right; left" (05/13/2012)  . LEFT HEART CATHETERIZATION WITH CORONARY ANGIOGRAM N/A 05/13/2012   Procedure: LEFT HEART CATHETERIZATION WITH CORONARY ANGIOGRAM;  Surgeon: [name], MD;  Location: [facility];  Service: Cardiovascular;  Laterality: N/A;  . OSTEOTOMY AND ULNAR SHORTENING  07/21/2002   "right" (05/13/2012)  . TONSILLECTOMY AND ADENOIDECTOMY  1950's  . TUBAL LIGATION  1980    Family History  Problem Relation Age of Onset  . Hypertension Mother   . Diabetes Mother   . Hyperlipidemia Father   . Heart disease Father   . Stroke Father   . Heart disease Sister   . Diabetes Sister   . Heart disease Brother   . Diabetes Maternal Aunt     Social History:  reports that she quit smoking about 30 years ago. Her smoking use included cigarettes. She has a 10.00 pack-year smoking history. She has never used smokeless tobacco. She reports current alcohol use of about 12.0 standard drinks of alcohol per week. She reports that she does not use drugs.  Allergies:  Allergies  Allergen Reactions  . Statins Other (See Comments)    "intermittent loss of circulation; hands and arms will go to sleep; feet will get cramps; fatigue" (05/13/2012).  This occurred with Lipitor, Zocor, Crestor 5 mg qd, and she thinks pravastatin as well  . Amlodipine Itching   . Gadolinium Derivatives Itching and Nausea Only    Pt had severe nausea and itching on legs, neck and back, per dr [name] pt needs 13 hour prep before contrast in the future  . Metformin And Related Other (See Comments)    Abdominal bloating    Allergies as of 08/11/2018      Reactions   Statins Other (See Comments)   "intermittent loss of circulation; hands and arms will go to sleep; feet will get cramps; fatigue" (05/13/2012).  This occurred with Lipitor, Zocor, Crestor 5 mg qd, and she thinks pravastatin as well   Amlodipine Itching   Gadolinium Derivatives Itching, Nausea Only   Pt had severe nausea and itching on legs, neck and back, per dr [name] pt needs 13 hour prep  before contrast in the future   Metformin And Related Other (See Comments)   Abdominal bloating      Medication List       Accurate as of August 11, 2018  2:18 PM. Always use your most recent med list.        acetaminophen 500 MG tablet Commonly known as:  TYLENOL Take 500 mg by mouth every 6 (six) hours as needed for mild pain or headache.   aspirin 81 MG tablet Take 81 mg by mouth daily.   atorvastatin 10 MG tablet Commonly known as:  LIPITOR TAKE 1 TABLET(10 MG) BY MOUTH DAILY   calcium-vitamin D 500-200 MG-UNIT tablet Commonly known as:  OSCAL WITH D Take 2 tablets by mouth daily.   carvedilol 6.25 MG tablet Commonly known as:  COREG Take 1.5 tablets (9.375 mg total) by mouth 2 (two) times daily.   chlorthalidone 25 MG tablet Commonly known as:  HYGROTON Take 0.5 tablets (12.5 mg total) by mouth every other day.   clopidogrel 75 MG tablet Commonly known as:  PLAVIX TAKE 1 TABLET(75 MG) BY MOUTH DAILY   diclofenac sodium 1 % Gel Commonly known as:  VOLTAREN Apply 2 g topically daily as needed. For pain   ezetimibe 10 MG tablet Commonly known as:  ZETIA TAKE 1 TABLET(10 MG) BY MOUTH DAILY   fenofibrate 160 MG tablet TAKE 1 TABLET(160 MG) BY MOUTH DAILY   glucose blood test strip  Commonly known as:  OneTouch Verio Use to test blood sugar once daily Dx code E11.9   hydrocortisone valerate cream 0.2 % Commonly known as:  WESTCORT Apply 1 application topically daily as needed (ITCHING).   irbesartan 300 MG tablet Commonly known as:  AVAPRO Take 0.5 tablets (150 mg total) by mouth 2 (two) times daily.   levothyroxine 25 MCG tablet Commonly known as:  Synthroid Take 1 tablet (25 mcg total) by mouth daily before breakfast.   metFORMIN 500 MG 24 hr tablet Commonly known as:  GLUCOPHAGE-XR TAKE 2 TABLETS(1000 MG) BY MOUTH DAILY WITH SUPPER   multivitamin with minerals Tabs tablet Take 1 tablet by mouth daily.   nitroGLYCERIN 0.4 MG SL tablet Commonly known as:  NITROSTAT PLACE 1 TABLET UNDER THE TONGUE EVERY 5 MINUTES AS NEEDED FOR CHEST PAIN   Vitamin D3 50 MCG (2000 UT) Tabs Take 2,000 Units by mouth 2 (two) times daily.       LABS:  Lab on 08/09/2018  Component Date Value Ref Range Status  . Glucose, Bld 08/09/2018 117* 70 - 99 mg/dL Final  . TSH 08/09/2018 3.65  0.35 - 4.50 uIU/mL Final  . Free T4 08/09/2018 1.14  0.60 - 1.60 ng/dL Final   Comment: Specimens from patients who are undergoing biotin therapy and /or ingesting biotin supplements may contain high levels of biotin.  The higher biotin concentration in these specimens interferes with this Free T4 assay.  Specimens that contain high levels  of biotin may cause false high results for this Free T4 assay.  Please interpret results in light of the total clinical presentation of the patient.        Review of Systems    OSTEOPENIA: She had a follow-up bone density in June 2019 but has not had a discussion with her PCP about this Lowest T score is at the right neck femur with a value of -2.4 compared to 2.0, 2 years previously and she has not been on treatment She does however say that she had tried  Fosamax in the past and this caused some GI side effects and does not want to try it again    She received RECLAST infusion on 12/15/17   Other active medical issues followed by various physicians:  Hypertension, treated with Avapro, chlorthalidone half tablet and Coreg, Followed by PCP   History of hyperlipidemia with some intolerance to statins, on Fenofibrate, 10 mg Lipitor and also on Zetia.  This is followed by cardiologist in the lipid clinic  Last LDL 95  Lab Results  Component Value Date   CHOL 161 02/17/2017   HDL 42.40 02/17/2017   LDLCALC 93 02/17/2017   LDLDIRECT 130.1 04/04/2014   TRIG 127.0 02/17/2017   CHOLHDL 4 02/17/2017      HYPOTHYROIDISM: TSH level previously has been as high as 5.6  This started spontaneously improved but in 1/28 was high again  At that time she was complaining of some fatigue, constipation, dry skin, some cold intolerance and difficulty losing weight.  She is apparently starting levothyroxine 25 mcg daily for this She says that she is unable to be sure whether she is feeling better with her fatigue since she has had other ongoing problems and sometimes does feel fairly good Her weight is about the same and she cannot judge her cold intolerance  Her TSH is back to normal  Lab Results  Component Value Date   TSH 3.65 08/09/2018   TSH 5.17 (H) 06/07/2018   TSH 4.22 06/23/2017   FREET4 1.14 08/09/2018   FREET4 1.03 06/03/2016      PHYSICAL EXAM:  BP 140/70 (BP Location: Left Arm, Patient Position: Sitting, Cuff Size: Normal)   Pulse 77   Temp 97.8 F (36.6 C) (Oral)   Ht 5\' 1"  (1.549 m)   Wt 137 lb 3.2 oz (62.2 kg)   SpO2 98%   BMI 25.92 kg/m      ASSESSMENT:   Prediabetes with impaired fasting glucose and also impaired glucose tolerance Recent blood sugars look good  She does not need to be active with exercise and she can resume when she is not having as much pain Discussed blood sugar targets and will recheck A1c on the next visit  HYPOTHYROIDISM: Although this is likely to be subclinical and she thinks  she is symptomatic and appears to have some symptoms of hypothyroidism although this may not be new   PLAN:   Continue levothyroxine 25 mcg as before Discussed that if her TSH is low normal we may be able to stop her medication but she also can do a trial of not taking the medication for short-term  follow-up TSH on next visit    Elayne Snare 08/11/2018, 2:18 PM

## 2018-08-12 DIAGNOSIS — M5440 Lumbago with sciatica, unspecified side: Secondary | ICD-10-CM | POA: Diagnosis not present

## 2018-08-12 DIAGNOSIS — M545 Low back pain: Secondary | ICD-10-CM | POA: Diagnosis not present

## 2018-08-12 DIAGNOSIS — M48061 Spinal stenosis, lumbar region without neurogenic claudication: Secondary | ICD-10-CM | POA: Diagnosis not present

## 2018-08-28 ENCOUNTER — Other Ambulatory Visit: Payer: Self-pay | Admitting: Endocrinology

## 2018-09-01 ENCOUNTER — Telehealth: Payer: Self-pay | Admitting: Cardiovascular Disease

## 2018-09-01 ENCOUNTER — Other Ambulatory Visit: Payer: Self-pay | Admitting: Pharmacist Clinician (PhC)/ Clinical Pharmacy Specialist

## 2018-09-01 DIAGNOSIS — I2510 Atherosclerotic heart disease of native coronary artery without angina pectoris: Secondary | ICD-10-CM

## 2018-09-01 DIAGNOSIS — I10 Essential (primary) hypertension: Secondary | ICD-10-CM

## 2018-09-01 NOTE — Telephone Encounter (Signed)
Spoke with patient about upcoming BP appointment.  Asked her to send readings thru MyChart and we will contact her for telephone visit.  She will need metabolic panel and lipids, will go to [facility] on [address]

## 2018-09-01 NOTE — Telephone Encounter (Signed)
New Message          Patient is calling in with her B/P Readings and Pulse    08/04/18 138/75 Pulse 74 08/05/18 150/88  Pulse 60 08/05/18 146/80  Pulse 67 pm 08/06/18 154/83  Pulse 73  08/07/18  158/94 Pulse 66 [id number] 130/72 Pulse 89 pm 08/08/18 149/88  Pulse 68 pm                 118/72  Pulse 74 pm not sure of date 08/14/18   122/75 Pulse 68 pm 08/14/18    115/63 Pulse 81pm 08/15/18     136/71 Pulse 69 08/17/18     121/75 Pulse 66 08/17/18      135/84 Pulse 68 pm 08/19/18       138/33 Pulse 65 pm 08/22/18        137/81 Pulse 74 08/22/18         158/90 Pulse 74  08/25/18          115/66 Pulse 84  08/26/18          127/76 Pulse 67 08/26/18           133/71 Pulse 74 pm 08/29/18            129/77 Pulse 75 pm 08/30/18             135/70 Pulse 76 pm 09/01/18              124/76 Pulse 75   Patient could not find a place to put her B/P Reading in my chart, she would like to know this information for future references. Pls call to advise.

## 2018-09-02 DIAGNOSIS — H353112 Nonexudative age-related macular degeneration, right eye, intermediate dry stage: Secondary | ICD-10-CM | POA: Diagnosis not present

## 2018-09-02 NOTE — Telephone Encounter (Signed)
Spoke with patient about her current BP readings.  After a few high readings the week she started back on chlorthalidone, her BP has improved greatly, with an average of 136/78.  If we only look at the last 15 readings, starting about 10 days after adding chlorthalidone back in, the average drops to 597 systolic.    Patient is having no problems with the medications currently.  Taking irbesartan 150 mg bid, carvedilol 6.25 mg bid and chlorthalidone 12.5 mg qod.  No CP, SOB, dizziness, lightheadedness or lower extremity edema.  She does need to have labs drawn, and we have made arrangements for her to go to [place] next week.  Patient aware that she should continue with regular monitoring and can contact us thru MyChart

## 2018-09-06 ENCOUNTER — Ambulatory Visit: Payer: Medicare Other

## 2018-09-08 ENCOUNTER — Telehealth: Payer: Self-pay | Admitting: Cardiovascular Disease

## 2018-09-08 NOTE — Telephone Encounter (Signed)
Spoke with patient and she will Friday for labs at [facility]

## 2018-09-08 NOTE — Telephone Encounter (Signed)
Pt needs a call back about her lab work and the paper work for it Pt stated she does not mind if it's mailed to her.  Pt would like to get it done.  Please give her a call on what to do.

## 2018-09-10 DIAGNOSIS — I2510 Atherosclerotic heart disease of native coronary artery without angina pectoris: Secondary | ICD-10-CM | POA: Diagnosis not present

## 2018-09-10 DIAGNOSIS — I10 Essential (primary) hypertension: Secondary | ICD-10-CM | POA: Diagnosis not present

## 2018-09-10 LAB — COMPREHENSIVE METABOLIC PANEL
ALT: 18 IU/L (ref 0–32)
AST: 13 IU/L (ref 0–40)
Albumin/Globulin Ratio: 1.6 (ref 1.2–2.2)
Albumin: 4.6 g/dL (ref 3.7–4.7)
Alkaline Phosphatase: 41 IU/L (ref 39–117)
BUN/Creatinine Ratio: 21 (ref 12–28)
BUN: 19 mg/dL (ref 8–27)
Bilirubin Total: 0.4 mg/dL (ref 0.0–1.2)
CO2: 22 mmol/L (ref 20–29)
Calcium: 10.4 mg/dL — ABNORMAL HIGH (ref 8.7–10.3)
Chloride: 98 mmol/L (ref 96–106)
Creatinine, Ser: 0.89 mg/dL (ref 0.57–1.00)
GFR calc Af Amer: 73 mL/min/{1.73_m2} (ref >59)
GFR calc non Af Amer: 64 mL/min/{1.73_m2} (ref >59)
Globulin, Total: 2.8 g/dL (ref 1.5–4.5)
Glucose: 120 mg/dL — ABNORMAL HIGH (ref 65–99)
Potassium: 4.5 mmol/L (ref 3.5–5.2)
Sodium: 136 mmol/L (ref 134–144)
Total Protein: 7.4 g/dL (ref 6.0–8.5)

## 2018-09-11 LAB — LIPID PANEL
Chol/HDL Ratio: 4.5 ratio — ABNORMAL HIGH (ref 0.0–4.4)
Cholesterol, Total: 190 mg/dL (ref 100–199)
HDL: 42 mg/dL (ref >39)
LDL Calculated: 112 mg/dL — ABNORMAL HIGH (ref 0–99)
Triglycerides: 179 mg/dL — ABNORMAL HIGH (ref 0–149)
VLDL Cholesterol Cal: 36 mg/dL (ref 5–40)

## 2018-09-13 ENCOUNTER — Other Ambulatory Visit: Payer: Self-pay | Admitting: Pharmacist Clinician (PhC)/ Clinical Pharmacy Specialist

## 2018-09-16 ENCOUNTER — Telehealth: Payer: Self-pay | Admitting: Pharmacist

## 2018-09-16 MED ORDER — EVOLOCUMAB 140 MG/ML ~~LOC~~ SOAJ: 140.0000 mg | SUBCUTANEOUS | 11 refills | Status: DC

## 2018-09-16 MED ORDER — ATORVASTATIN CALCIUM 10 MG PO TABS: ORAL_TABLET | ORAL | 3 refills | Status: DC

## 2018-09-16 NOTE — Telephone Encounter (Signed)
PA for Repatha approved until 03-17-2019  Rx sent to prefer pharmacy.

## 2018-09-16 NOTE — Telephone Encounter (Signed)
In my last conversation with the patient she had wanted to try increasing the atorvastatin in addition to adding on the [place].  I suggested a slow trial.  She currently takes atorvastatin 10 mg daily, so will have her increase to 20 mg just twice weekly for now.  If the Repatha is not cost prohibitive, she would probably be fine with staying on 10 mg daily.

## 2018-09-16 NOTE — Telephone Encounter (Signed)
Follow Up:; ° ° °Returning your call. °

## 2018-09-16 NOTE — Telephone Encounter (Signed)
Returned call to patient.  Explained that Repatha was approved by insurance and available at pharmacy.

## 2018-09-16 NOTE — Addendum Note (Signed)
Addended by: Rockne Menghini on: 09/16/2018 03:00 PM   Modules accepted: Orders

## 2018-09-17 ENCOUNTER — Telehealth: Payer: Self-pay | Admitting: Cardiovascular Disease

## 2018-09-17 NOTE — Telephone Encounter (Signed)
New message: ° ° ° °Patient returning your call back. °

## 2018-09-17 NOTE — Telephone Encounter (Signed)
New Message ° ° °Patient returning your call. °

## 2018-09-17 NOTE — Telephone Encounter (Signed)
LMOM; please call back to discuss medication.

## 2018-09-17 NOTE — Telephone Encounter (Signed)
Patient will call [facility] to determine co-pay , then will call back if unable to afford.

## 2018-09-20 ENCOUNTER — Other Ambulatory Visit: Payer: Self-pay

## 2018-09-20 NOTE — Telephone Encounter (Signed)
Patient is returning your call about the repatha.

## 2018-09-22 ENCOUNTER — Telehealth: Payer: Self-pay | Admitting: Pharmacist Clinician (PhC)/ Clinical Pharmacy Specialist

## 2018-09-22 NOTE — Telephone Encounter (Signed)
Follow Up:     Returning your call from Monday.

## 2018-09-23 NOTE — Telephone Encounter (Signed)
Spoke with patient this am.  She received letter of approval for Repatha, $40/month, thru October 2020.  Patient will fill rx, ask pharmacist at store to show her how to inject.  Also suggested You Tube video or call us if she needs help.  Patient voiced understanding

## 2018-09-25 ENCOUNTER — Other Ambulatory Visit: Payer: Self-pay | Admitting: Cardiovascular Disease

## 2018-09-30 ENCOUNTER — Other Ambulatory Visit: Payer: Self-pay | Admitting: Cardiovascular Disease

## 2018-10-03 ENCOUNTER — Other Ambulatory Visit: Payer: Self-pay | Admitting: Endocrinology

## 2018-10-04 DIAGNOSIS — I2510 Atherosclerotic heart disease of native coronary artery without angina pectoris: Secondary | ICD-10-CM | POA: Diagnosis not present

## 2018-10-04 DIAGNOSIS — I10 Essential (primary) hypertension: Secondary | ICD-10-CM | POA: Diagnosis not present

## 2018-10-04 DIAGNOSIS — E782 Mixed hyperlipidemia: Secondary | ICD-10-CM | POA: Diagnosis not present

## 2018-10-05 ENCOUNTER — Telehealth: Payer: Self-pay | Admitting: Cardiovascular Disease

## 2018-10-05 NOTE — Telephone Encounter (Signed)
Smartphone/ my chart active/ consent/ pre reg completed °

## 2018-10-06 ENCOUNTER — Telehealth (INDEPENDENT_AMBULATORY_CARE_PROVIDER_SITE_OTHER): Payer: Medicare Other | Admitting: Cardiovascular Disease

## 2018-10-06 ENCOUNTER — Telehealth: Payer: Self-pay

## 2018-10-06 ENCOUNTER — Other Ambulatory Visit: Payer: Self-pay

## 2018-10-06 VITALS — BP 137/79 | HR 81 | Ht 61.5 in | Wt 134.5 lb

## 2018-10-06 DIAGNOSIS — Z95820 Peripheral vascular angioplasty status with implants and grafts: Secondary | ICD-10-CM

## 2018-10-06 DIAGNOSIS — E785 Hyperlipidemia, unspecified: Secondary | ICD-10-CM

## 2018-10-06 DIAGNOSIS — I10 Essential (primary) hypertension: Secondary | ICD-10-CM

## 2018-10-06 DIAGNOSIS — I2510 Atherosclerotic heart disease of native coronary artery without angina pectoris: Secondary | ICD-10-CM | POA: Diagnosis not present

## 2018-10-06 NOTE — Telephone Encounter (Signed)
Patient and/or DPR-approved person aware of AVS instructions and verbalized understanding. AVS 5/20 to [employer]

## 2018-10-06 NOTE — Progress Notes (Signed)
Virtual Visit via Telephone Note   This visit type was conducted due to national recommendations for restrictions regarding the COVID-19 Pandemic (e.g. social distancing) in an effort to limit this patient's exposure and mitigate transmission in our community.  Due to her co-morbid illnesses, this patient is at least at moderate risk for complications without adequate follow up.  This format is felt to be most appropriate for this patient at this time.  The patient did not have access to video technology/had technical difficulties with video requiring transitioning to audio format only (telephone).  All issues noted in this document were discussed and addressed.  No physical exam could be performed with this format.  Please refer to the patient's chart for her  consent to telehealth for [facility].   Date:  10/06/2018   ID:  Brittany Figueroa, DOB [birth year], MRN [id number]  Patient Location: Home Provider Location: Home  PCP:  Brittany Manes, MD  Cardiologist: Dr. Quay Figueroa Electrophysiologist:  None   Evaluation Performed:  Follow-Up Visit  Chief Complaint: Follow-up CAD and hyperlipidemia  History of Present Illness:    Brittany Figueroa is a 78 y.o.  mildly overweight divorced Caucasian female, mother to 2, grandmother to 3 grandchildren, who I last saw  09/29/2017. She has a history of hypertension and hyperlipidemia. I saw her in December of 2013 with new anterolateral T-wave inversion and a Myoview that showed inferolateral ischemia that was new compared to a prior study. She did have some unusual symptoms at that time. Based on this, I catheterized her on May 13, 2012, revealing an 80% proximal LAD lesion, which I stented using a drug-eluting stent, as well as a total dominant RCA with left to right collaterals and normal LV function. Since stenting her LAD, she is ultimately asymptomatic. Her other problems include hypertension and hyperlipidemia. I have reviewed her blood  pressures, which were recorded during cardiac rehab, which were all normal. Her recent blood work revealed a total cholesterol of 161, LDL of 89 and HDL of 40.she denies chest pain or shortness of breath. She had a recent Myoview stress test performed several months ago that showed ischemia in the RCA territory but otherwise unremarkable is explainable by her known total dominant RCA with left to right collaterals. Chief complaint of excruciating neck pain thought to be related to cervical disc disease. She was evaluated by Dr. Because of her neck pain demonstrated cervical disc disease requiring fusion. Because of this she will need to undergo pharmacologic Myoview stress testing to risk stratify her. She underwent cervical discectomy by Dr. Deri Figueroa with excellent clinical result. She no longer is in pain. Her major issue now is treatment of her hyperlipidemia. She is statin intolerant and complains of short-term memory loss.  The patient was begun on Repatha several weeks ago because of statin intolerance and not being at goal on multiple oral medications.  Since I saw her a year ago she is remained stable.  She denies chest pain or shortness of breath.  She is sheltering in place and socially distancing.  The major issues she is dealing with is lumbar stenosis and sciatica which are keeping her inside.   The patient does not have symptoms concerning for COVID-19 infection (fever, chills, cough, or new shortness of breath).    Past Medical History:  Diagnosis Date   Abnormal finding on EKG, new anterolateral T-wave inversions  05/14/2012   Abnormal nuclear stress test, with infero-lateral ischemia 05/14/2012   Arthritis    "  right thumb; all my fingers" (05/13/2012), cervical spondylosis    Atypical angina ([place]) 05/14/2012   CAD (coronary artery disease), 05/13/12, with 80% LAD 05/14/2012   Cancer ([place])    basal cell on facial in L temporal region     [facility] arthritis, thumb, degenerative     Complication of anesthesia 12/2009   "had endoscopy; larynx went into spasms; stopped breathing for 15 seconds" (05/13/2012)   GERD (gastroesophageal reflux disease) 2011   Hypercholesteremia    Hypertension    S/P angioplasty with stent, LAD 05/13/12 05/14/2012   Tuberculosis    test +- [place] [facility] - Dr. Alyson Figueroa- CXR- OK   Past Surgical History:  Procedure Laterality Date   ANTERIOR CERVICAL DECOMP/DISCECTOMY FUSION N/A 12/13/2013   Procedure: ANTERIOR CERVICAL DECOMPRESSION/DISCECTOMY FUSION 3 LEVELS Cervical four/five,five/six,six/seven. Anterior cervical disectomy and fusion with peek and plate ;  Surgeon: Brittany Pitter, MD;  Location: [facility];  Service: Neurosurgery;  Laterality: N/A;   CARDIAC CATHETERIZATION N/A 07/02/2015   Procedure: Left Heart Cath and Coronary Angiography;  Surgeon: Brittany Harp, MD;  Location: [facility] CV [facility];  Service: Cardiovascular;  Laterality: N/A;   CORONARY ANGIOPLASTY WITH STENT PLACEMENT  05/13/2012   DES to LAD; she has total RCA with left to rt coll. normal LV function done for positive nuc study   DILATION AND CURETTAGE OF UTERUS  1960's; 1970's; 1980   "probably 3" (05/13/2012)   FOREARM / WRIST TENDON LESION EXCISION  ?11/2001   "right; did waver to try to get ulnar out of hole that it had cut" (05/13/2012   INGUINAL HERNIA REPAIR  ~ 1966; 07/21/2002   "right; left" (05/13/2012)   LEFT HEART CATHETERIZATION WITH CORONARY ANGIOGRAM N/A 05/13/2012   Procedure: LEFT HEART CATHETERIZATION WITH CORONARY ANGIOGRAM;  Surgeon: Brittany Harp, MD;  Location: [facility] CATH LAB;  Service: Cardiovascular;  Laterality: N/A;   OSTEOTOMY AND ULNAR SHORTENING  07/21/2002   "right" (05/13/2012)   TONSILLECTOMY AND ADENOIDECTOMY  1950's   TUBAL LIGATION  1980     No outpatient medications have been marked as taking for the 10/06/18 encounter (Appointment) with Brittany Harp, MD.     Allergies:   Statins; Amlodipine; Gadolinium derivatives;  and Metformin and related   Social History   Tobacco Use   Smoking status: Former Smoker    Packs/day: 0.50    Years: 20.00    Pack years: 10.00    Types: Cigarettes    Last attempt to quit: 07/05/1988    Years since quitting: 30.2   Smokeless tobacco: Never Used  Substance Use Topics   Alcohol use: Yes    Alcohol/week: 12.0 standard drinks    Types: 12 Glasses of wine per week    Comment: 05/13/2012 "6-8 oz glass of red wine q hs"    Drug use: No     Family Hx: The patient's family history includes Diabetes in her maternal aunt, mother, and sister; Heart disease in her brother, father, and sister; Hyperlipidemia in her father; Hypertension in her mother; Stroke in her father.  ROS:   Please see the history of present illness.     All other systems reviewed and are negative.   Prior CV studies:   The following studies were reviewed today:  None  Labs/Other Tests and Data Reviewed:    EKG:  No ECG reviewed.  Recent Labs: 04/05/2018: Hemoglobin 12.3; Platelets 425 08/09/2018: TSH 3.65 09/10/2018: ALT 18; BUN 19; Creatinine, Ser 0.89; Potassium 4.5; Sodium 136  Recent Lipid Panel Lab Results  Component Value Date/Time   CHOL 190 09/10/2018 08:32 AM   CHOL 172 12/13/2014 08:56 AM   TRIG 179 (H) 09/10/2018 08:32 AM   TRIG 123 12/13/2014 08:56 AM   HDL 42 09/10/2018 08:32 AM   HDL 50 12/13/2014 08:56 AM   CHOLHDL 4.5 (H) 09/10/2018 08:32 AM   CHOLHDL 4 02/17/2017 08:24 AM   LDLCALC 112 (H) 09/10/2018 08:32 AM   LDLCALC 97 12/13/2014 08:56 AM   LDLDIRECT 130.1 04/04/2014 08:44 AM    Wt Readings from Last 3 Encounters:  08/11/18 137 lb 3.2 oz (62.2 kg)  06/10/18 137 lb 6.4 oz (62.3 kg)  03/25/18 137 lb (62.1 kg)     Objective:    Vital Signs:  There were no vitals taken for this visit.   VITAL SIGNS:  reviewed a complete physical exam was not performed since this was a virtual telemedicine phone visit  ASSESSMENT & PLAN:    1. Coronary artery  disease- history of CAD status post proximal LAD stenting by myself 05/13/2012 with a documented occlusion of the dominant RCA, left-to-right collaterals and normal LV function.  Because of symptoms she had a Myoview stress test performed 06/15/2015 that showed ischemia in the RCA territory which led to cardiac catheterization 07/02/2015 revealing a widely patent LAD stent with unchanged anatomy.  She was treated medically and has been asymptomatic since on DAPT. 2. Essential hypertension- history of essential hypertension on Avapro, carvedilol and chlorthalidone with blood pressure measured today by the patient at home of 137/79 with a pulse of 81.  She is followed in our hypertension clinic by [facility]. 3. Hyperlipidemia- history of resistant hyperlipidemia on atorvastatin, recently begun on Repatha, Zetia and fenofibrate with lipid profile performed 09/10/2018 revealing a total cholesterol 190, LDL of 112 and HDL 42.  We will recheck a lipid liver profile in 6 to 8 weeks.  If it is markedly improved hopefully we can down titrate her other medications.  COVID-19 Education: The signs and symptoms of COVID-19 were discussed with the patient and how to seek care for testing (follow up with PCP or arrange E-visit).  The importance of social distancing was discussed today.  Time:   Today, I have spent 7 minutes with the patient with telehealth technology discussing the above problems.     Medication Adjustments/Labs and Tests Ordered: Current medicines are reviewed at length with the patient today.  Concerns regarding medicines are outlined above.   Tests Ordered: No orders of the defined types were placed in this encounter.   Medication Changes: No orders of the defined types were placed in this encounter.   Disposition:  Follow up in 1 year(s)  Signed, Brittany Burow, MD  10/06/2018 7:20 AM    [facility]

## 2018-10-06 NOTE — Patient Instructions (Signed)
Medication Instructions:  Your physician recommends that you continue on your current medications as directed. Please refer to the Current Medication list given to you today.  If you need a refill on your cardiac medications before your next appointment, please call your pharmacy.   Lab work: Your physician recommends that you return for lab work in [place] TEST. YOU WILL RECEIVE A LAB SLIP IN THE MAIL. PLEASE DO NOT EAT OR DRINK (EXCEPT WATER) ANYTHING AFTER MIDNIGHT ON THE DAY YOU CHOOSE TO PRESENT FOR LAB WORK. YOU MAY EAT AFTER YOUR BLOOD HAS BEEN COLLECTED. NO APPOINTMENT IS NEEDED.   If you have labs (blood work) drawn today and your tests are completely normal, you will receive your results only by: Marland Kitchen MyChart Message (if you have MyChart) OR . A paper copy in the mail If you have any lab test that is abnormal or we need to change your treatment, we will call you to review the results.  Testing/Procedures: NONE  Follow-Up: At [facility], you and your health needs are our priority.  As part of our continuing mission to provide you with exceptional heart care, we have created designated Provider Care Teams.  These Care Teams include your primary Cardiologist (physician) and Advanced Practice Providers (APPs -  Physician Assistants and Nurse Practitioners) who all work together to provide you with the care you need, when you need it. . You will need a follow up appointment in 12 months with Dr. Gwenlyn Found.  Please call our office 2 months in advance to schedule this appointment.    Any Other Special Instructions Will Be Listed Below (If Applicable). A CLINICAL PHARMACIST FROM OUR OFFICE WILL FOLLOW UP WITH YOU REGARDING YOUR LAB WORK AND MEDICATION ADJUSTMENTS IF NECESSARY.

## 2018-10-07 DIAGNOSIS — M47816 Spondylosis without myelopathy or radiculopathy, lumbar region: Secondary | ICD-10-CM | POA: Diagnosis not present

## 2018-10-07 DIAGNOSIS — M9905 Segmental and somatic dysfunction of pelvic region: Secondary | ICD-10-CM | POA: Diagnosis not present

## 2018-10-07 DIAGNOSIS — M9903 Segmental and somatic dysfunction of lumbar region: Secondary | ICD-10-CM | POA: Diagnosis not present

## 2018-10-07 DIAGNOSIS — M48061 Spinal stenosis, lumbar region without neurogenic claudication: Secondary | ICD-10-CM | POA: Diagnosis not present

## 2018-10-09 ENCOUNTER — Other Ambulatory Visit: Payer: Self-pay | Admitting: Cardiovascular Disease

## 2018-10-12 DIAGNOSIS — M9905 Segmental and somatic dysfunction of pelvic region: Secondary | ICD-10-CM | POA: Diagnosis not present

## 2018-10-12 DIAGNOSIS — M48061 Spinal stenosis, lumbar region without neurogenic claudication: Secondary | ICD-10-CM | POA: Diagnosis not present

## 2018-10-12 DIAGNOSIS — M9903 Segmental and somatic dysfunction of lumbar region: Secondary | ICD-10-CM | POA: Diagnosis not present

## 2018-10-12 DIAGNOSIS — M47816 Spondylosis without myelopathy or radiculopathy, lumbar region: Secondary | ICD-10-CM | POA: Diagnosis not present

## 2018-10-14 DIAGNOSIS — H353121 Nonexudative age-related macular degeneration, left eye, early dry stage: Secondary | ICD-10-CM | POA: Diagnosis not present

## 2018-10-14 DIAGNOSIS — H353111 Nonexudative age-related macular degeneration, right eye, early dry stage: Secondary | ICD-10-CM | POA: Diagnosis not present

## 2018-10-15 DIAGNOSIS — M9905 Segmental and somatic dysfunction of pelvic region: Secondary | ICD-10-CM | POA: Diagnosis not present

## 2018-10-15 DIAGNOSIS — M9903 Segmental and somatic dysfunction of lumbar region: Secondary | ICD-10-CM | POA: Diagnosis not present

## 2018-10-15 DIAGNOSIS — M48061 Spinal stenosis, lumbar region without neurogenic claudication: Secondary | ICD-10-CM | POA: Diagnosis not present

## 2018-10-15 DIAGNOSIS — M47816 Spondylosis without myelopathy or radiculopathy, lumbar region: Secondary | ICD-10-CM | POA: Diagnosis not present

## 2018-10-18 DIAGNOSIS — M9905 Segmental and somatic dysfunction of pelvic region: Secondary | ICD-10-CM | POA: Diagnosis not present

## 2018-10-18 DIAGNOSIS — M9903 Segmental and somatic dysfunction of lumbar region: Secondary | ICD-10-CM | POA: Diagnosis not present

## 2018-10-18 DIAGNOSIS — M48061 Spinal stenosis, lumbar region without neurogenic claudication: Secondary | ICD-10-CM | POA: Diagnosis not present

## 2018-10-18 DIAGNOSIS — M47816 Spondylosis without myelopathy or radiculopathy, lumbar region: Secondary | ICD-10-CM | POA: Diagnosis not present

## 2018-10-21 ENCOUNTER — Telehealth: Payer: Self-pay | Admitting: Cardiovascular Disease

## 2018-10-21 DIAGNOSIS — L821 Other seborrheic keratosis: Secondary | ICD-10-CM | POA: Diagnosis not present

## 2018-10-21 DIAGNOSIS — L820 Inflamed seborrheic keratosis: Secondary | ICD-10-CM | POA: Diagnosis not present

## 2018-10-21 DIAGNOSIS — L570 Actinic keratosis: Secondary | ICD-10-CM | POA: Diagnosis not present

## 2018-10-21 DIAGNOSIS — D225 Melanocytic nevi of trunk: Secondary | ICD-10-CM | POA: Diagnosis not present

## 2018-10-21 DIAGNOSIS — L814 Other melanin hyperpigmentation: Secondary | ICD-10-CM | POA: Diagnosis not present

## 2018-10-21 DIAGNOSIS — D1801 Hemangioma of skin and subcutaneous tissue: Secondary | ICD-10-CM | POA: Diagnosis not present

## 2018-10-21 NOTE — Telephone Encounter (Signed)
New Message   Pt c/o medication issue:  1. Name of Medication: Evolocumab (REPATHA SURECLICK) 169 MG/ML SOAJ   2. How are you currently taking this medication (dosage and times per day)?   3. Are you having a reaction (difficulty breathing--STAT)?   4. What is your medication issue? Patient is calling because she is needing a prior authorization for this medication. The number for the prior authorization department is [phone].

## 2018-10-21 NOTE — Telephone Encounter (Signed)
[employer] approved Repatha on 09-15-2018 until October/2020.  Will call pharmacy and insurance to troubleshoot.   * PA still good* Patient has to meet deductible of $370 dollar before co-pay goes down to $40 per month* - Patient informed   Patient informed of above. She is requesting a Tier exception to decrease her co-pay*  Tier exception: [id number] submitted by phone. May take up to 72 hours for resolution.

## 2018-10-22 DIAGNOSIS — M47816 Spondylosis without myelopathy or radiculopathy, lumbar region: Secondary | ICD-10-CM | POA: Diagnosis not present

## 2018-10-22 DIAGNOSIS — M9903 Segmental and somatic dysfunction of lumbar region: Secondary | ICD-10-CM | POA: Diagnosis not present

## 2018-10-22 DIAGNOSIS — M9905 Segmental and somatic dysfunction of pelvic region: Secondary | ICD-10-CM | POA: Diagnosis not present

## 2018-10-22 DIAGNOSIS — M48061 Spinal stenosis, lumbar region without neurogenic claudication: Secondary | ICD-10-CM | POA: Diagnosis not present

## 2018-10-25 DIAGNOSIS — M48061 Spinal stenosis, lumbar region without neurogenic claudication: Secondary | ICD-10-CM | POA: Diagnosis not present

## 2018-10-25 DIAGNOSIS — M9905 Segmental and somatic dysfunction of pelvic region: Secondary | ICD-10-CM | POA: Diagnosis not present

## 2018-10-25 DIAGNOSIS — M9903 Segmental and somatic dysfunction of lumbar region: Secondary | ICD-10-CM | POA: Diagnosis not present

## 2018-10-25 DIAGNOSIS — M47816 Spondylosis without myelopathy or radiculopathy, lumbar region: Secondary | ICD-10-CM | POA: Diagnosis not present

## 2018-10-27 DIAGNOSIS — M47816 Spondylosis without myelopathy or radiculopathy, lumbar region: Secondary | ICD-10-CM | POA: Diagnosis not present

## 2018-10-27 DIAGNOSIS — M48061 Spinal stenosis, lumbar region without neurogenic claudication: Secondary | ICD-10-CM | POA: Diagnosis not present

## 2018-10-27 DIAGNOSIS — M9903 Segmental and somatic dysfunction of lumbar region: Secondary | ICD-10-CM | POA: Diagnosis not present

## 2018-10-27 DIAGNOSIS — M9905 Segmental and somatic dysfunction of pelvic region: Secondary | ICD-10-CM | POA: Diagnosis not present

## 2018-10-28 ENCOUNTER — Telehealth: Payer: Self-pay | Admitting: Cardiovascular Disease

## 2018-10-28 NOTE — Telephone Encounter (Signed)
Tier Exception denied. Patient will try [place].    Medication Samples have been provided to the patient.  Drug name: Repatha SureClick     Strength: 140mg         Qty: 1 LOT: [id number]  Exp.Date: 12/2020   Sharetta Ricchio Rodriguez-Guzman PharmD, BCPS, [place] [address] [place] [address] 10/28/2018 12:06 PM

## 2018-10-28 NOTE — Telephone Encounter (Signed)
New Message     Pt is calling to speak with Raquel about Samples of Repatha. She says she cant afford it    Please call back

## 2018-10-28 NOTE — Telephone Encounter (Signed)
Most of the medication in and only few drops left in the skin

## 2018-10-28 NOTE — Telephone Encounter (Signed)
Follow up:  Pt called asked to speak to Raqual.  Pt took the shot but some of it was left on her skin.  Per instructions on the back said call if anything happens.

## 2018-10-28 NOTE — Telephone Encounter (Signed)
Error

## 2018-10-31 DIAGNOSIS — H353112 Nonexudative age-related macular degeneration, right eye, intermediate dry stage: Secondary | ICD-10-CM | POA: Diagnosis not present

## 2018-11-02 DIAGNOSIS — M545 Low back pain: Secondary | ICD-10-CM | POA: Diagnosis not present

## 2018-11-02 DIAGNOSIS — Z79899 Other long term (current) drug therapy: Secondary | ICD-10-CM | POA: Diagnosis not present

## 2018-11-02 DIAGNOSIS — G8929 Other chronic pain: Secondary | ICD-10-CM | POA: Diagnosis not present

## 2018-11-02 DIAGNOSIS — R7303 Prediabetes: Secondary | ICD-10-CM | POA: Diagnosis not present

## 2018-11-02 DIAGNOSIS — E559 Vitamin D deficiency, unspecified: Secondary | ICD-10-CM | POA: Diagnosis not present

## 2018-11-02 DIAGNOSIS — E782 Mixed hyperlipidemia: Secondary | ICD-10-CM | POA: Diagnosis not present

## 2018-11-02 DIAGNOSIS — I10 Essential (primary) hypertension: Secondary | ICD-10-CM | POA: Diagnosis not present

## 2018-11-03 DIAGNOSIS — M48061 Spinal stenosis, lumbar region without neurogenic claudication: Secondary | ICD-10-CM | POA: Diagnosis not present

## 2018-11-03 DIAGNOSIS — M9905 Segmental and somatic dysfunction of pelvic region: Secondary | ICD-10-CM | POA: Diagnosis not present

## 2018-11-03 DIAGNOSIS — M9903 Segmental and somatic dysfunction of lumbar region: Secondary | ICD-10-CM | POA: Diagnosis not present

## 2018-11-03 DIAGNOSIS — M47816 Spondylosis without myelopathy or radiculopathy, lumbar region: Secondary | ICD-10-CM | POA: Diagnosis not present

## 2018-11-05 DIAGNOSIS — M47816 Spondylosis without myelopathy or radiculopathy, lumbar region: Secondary | ICD-10-CM | POA: Diagnosis not present

## 2018-11-05 DIAGNOSIS — M9903 Segmental and somatic dysfunction of lumbar region: Secondary | ICD-10-CM | POA: Diagnosis not present

## 2018-11-05 DIAGNOSIS — M48061 Spinal stenosis, lumbar region without neurogenic claudication: Secondary | ICD-10-CM | POA: Diagnosis not present

## 2018-11-05 DIAGNOSIS — M9905 Segmental and somatic dysfunction of pelvic region: Secondary | ICD-10-CM | POA: Diagnosis not present

## 2018-11-08 ENCOUNTER — Other Ambulatory Visit: Payer: Self-pay

## 2018-11-08 ENCOUNTER — Other Ambulatory Visit (INDEPENDENT_AMBULATORY_CARE_PROVIDER_SITE_OTHER): Payer: Medicare Other

## 2018-11-08 DIAGNOSIS — R7303 Prediabetes: Secondary | ICD-10-CM

## 2018-11-08 DIAGNOSIS — E782 Mixed hyperlipidemia: Secondary | ICD-10-CM | POA: Diagnosis not present

## 2018-11-08 DIAGNOSIS — E039 Hypothyroidism, unspecified: Secondary | ICD-10-CM

## 2018-11-08 LAB — HEMOGLOBIN A1C: Hgb A1c MFr Bld: 6.1 % (ref 4.6–6.5)

## 2018-11-08 LAB — COMPREHENSIVE METABOLIC PANEL
ALT: 20 U/L (ref 0–35)
AST: 22 U/L (ref 0–37)
Albumin: 4.2 g/dL (ref 3.5–5.2)
Alkaline Phosphatase: 32 U/L — ABNORMAL LOW (ref 39–117)
BUN: 17 mg/dL (ref 6–23)
CO2: 27 mEq/L (ref 19–32)
Calcium: 9.6 mg/dL (ref 8.4–10.5)
Chloride: 100 mEq/L (ref 96–112)
Creatinine, Ser: 0.68 mg/dL (ref 0.40–1.20)
GFR: 84.17 mL/min (ref >60.00)
Glucose, Bld: 113 mg/dL — ABNORMAL HIGH (ref 70–99)
Potassium: 3.9 mEq/L (ref 3.5–5.1)
Sodium: 132 mEq/L — ABNORMAL LOW (ref 135–145)
Total Bilirubin: 0.5 mg/dL (ref 0.2–1.2)
Total Protein: 6.8 g/dL (ref 6.0–8.3)

## 2018-11-08 LAB — LIPID PANEL
Cholesterol: 84 mg/dL (ref 0–200)
HDL: 46.3 mg/dL (ref >39.00)
LDL Cholesterol: 21 mg/dL (ref 0–99)
NonHDL: 37.98
Total CHOL/HDL Ratio: 2
Triglycerides: 85 mg/dL (ref 0.0–149.0)
VLDL: 17 mg/dL (ref 0.0–40.0)

## 2018-11-08 LAB — T4, FREE: Free T4: 1.28 ng/dL (ref 0.60–1.60)

## 2018-11-08 LAB — TSH: TSH: 2.28 u[IU]/mL (ref 0.35–4.50)

## 2018-11-09 DIAGNOSIS — R509 Fever, unspecified: Secondary | ICD-10-CM | POA: Diagnosis not present

## 2018-11-10 ENCOUNTER — Telehealth: Payer: Self-pay | Admitting: Cardiovascular Disease

## 2018-11-10 DIAGNOSIS — Z79899 Other long term (current) drug therapy: Secondary | ICD-10-CM

## 2018-11-10 DIAGNOSIS — E785 Hyperlipidemia, unspecified: Secondary | ICD-10-CM

## 2018-11-10 NOTE — Telephone Encounter (Signed)
Awesome recent lipid profile on Repatha.  Okay to stop Lipitor and recheck lipid liver profile in 3 months

## 2018-11-10 NOTE — Telephone Encounter (Signed)
Per pt had recent LFT and LIPIDs drawn and pt was under impression may be able to stop some of the cholesterol meds since starting Repatha  LDL was 21 Pt is complaining with severe leg cramps and "giving out". Will forward to Dr Gwenlyn Found and [facility] for review and recommendations ./cy

## 2018-11-10 NOTE — Telephone Encounter (Signed)
Advised patient to d/c atorvastatin, but continue Repatha and ezetimibe.  Wanted to d/c fenofibrate and ezetimibe also.  Advised that fenofibrate does not work on LDL, she needs to continue that.  Suggested she cut ezetimibe in half rather than d/c, to prevent LDL from going back up > 70.  Patient voiced understanding

## 2018-11-10 NOTE — Telephone Encounter (Signed)
New message   Patient wants to know if blood work has been received and wants to know if she can be taken off some of her cholesterol medication. Please call to discuss.

## 2018-11-11 ENCOUNTER — Ambulatory Visit (INDEPENDENT_AMBULATORY_CARE_PROVIDER_SITE_OTHER): Payer: Medicare Other | Admitting: Endocrinology

## 2018-11-11 ENCOUNTER — Encounter: Payer: Self-pay | Admitting: Endocrinology

## 2018-11-11 ENCOUNTER — Other Ambulatory Visit: Payer: Self-pay

## 2018-11-11 VITALS — BP 112/76 | Temp 99.1°F | Ht 61.5 in | Wt 133.0 lb

## 2018-11-11 DIAGNOSIS — R7303 Prediabetes: Secondary | ICD-10-CM | POA: Diagnosis not present

## 2018-11-11 DIAGNOSIS — E039 Hypothyroidism, unspecified: Secondary | ICD-10-CM | POA: Diagnosis not present

## 2018-11-11 DIAGNOSIS — I2510 Atherosclerotic heart disease of native coronary artery without angina pectoris: Secondary | ICD-10-CM | POA: Diagnosis not present

## 2018-11-11 DIAGNOSIS — M810 Age-related osteoporosis without current pathological fracture: Secondary | ICD-10-CM | POA: Diagnosis not present

## 2018-11-11 DIAGNOSIS — Z780 Asymptomatic menopausal state: Secondary | ICD-10-CM

## 2018-11-11 DIAGNOSIS — R7989 Other specified abnormal findings of blood chemistry: Secondary | ICD-10-CM

## 2018-11-11 DIAGNOSIS — M8580 Other specified disorders of bone density and structure, unspecified site: Secondary | ICD-10-CM

## 2018-11-11 NOTE — Telephone Encounter (Signed)
Pt aware to present for lipid and liver labs in 3 months per Dr. Gwenlyn Found and that she will receive a lab slip in the mail. Pt verbalized understanding.   Lab slip mailed to pt address on file 6/25

## 2018-11-11 NOTE — Progress Notes (Signed)
Patient ID: Brittany Figueroa, female   DOB: [birth year], 78 y.o.   MRN: [id number]           Chief complaint: Endocrinology follow-up  Today's office visit was provided via telemedicine using video technique The patient was explained the limitations of evaluation and management by telemedicine and the availability of in person appointments.  The patient understood the limitations and agreed to proceed. Patient also understood that the telehealth visit is billable. . Location of the patient: Patient's home . Location of the provider: Physician office Only the patient and myself were participating in the encounter    History of Present Illness:  PREVIOUS history: She may have been told about 10 years ago that she had prediabetes and reportedly had a normal glucose tolerance test. She was told by her PCP that she has type 2 diabetes based on an A1c of 6.5% in 07/2013 She has not had any other A1c is subsequently above normal, normal range and previous lab 6.2 or less She also has not had any abnormal blood sugars except a glucose of 125 in 3/16  Has history of increased fasting blood sugars ranging from 108-125 since about 2012  GLUCOSE TOLERANCE test on 05/23/15: Fasting glucose 110, two-hour glucose 192  RECENT history:   She has been intermittently on metformin ER since about 08/2015 In 1/18 when A1c was 6.5 and glucose 133 fasting she was told to start back on metformin and also improve her diet She was taking 1500 mg a day but could not tolerate this because of feeling of bloating  Current regimen: Metformin ER 500 mg 2 tablets a day  A1c is 6.1, previously was 5.9 in 1/20  Current management and blood sugars:    She is checking her blood sugars mostly in the mornings  Her meter has the wrong time programmed but her blood sugars are averaging about 105 early morning  She has some readings late morning also which may be slightly higher  Has a couple of readings in the  afternoon and evening which are ranging from 82 -1 20  Because of her staying at home she is able to watch her diet fairly well  She thinks weight is about the same Currently she is trying to take both metformin in the evenings and this will cause late night diarrhea occasionally  Home readings using the FreeStyle   Average blood sugar for the last 30 days was 106 Blood sugar range 82-125, highest reading about 12 noon  Weight history: Previous range 139-143  Wt Readings from Last 3 Encounters:  11/11/18 133 lb (60.3 kg)  10/06/18 134 lb 8 oz (61 kg)  08/11/18 137 lb 3.2 oz (62.2 kg)    Lab Results  Component Value Date   HGBA1C 6.1 11/08/2018   HGBA1C 5.9 06/07/2018   HGBA1C 6.1 11/18/2017   Lab Results  Component Value Date   LDLCALC 21 11/08/2018   CREATININE 0.68 11/08/2018   HYPOTHYROIDISM and other problems reviewed today: See review of systems    Lab on 11/08/2018  Component Date Value Ref Range Status  . Free T4 11/08/2018 1.28  0.60 - 1.60 ng/dL Final   Comment: Specimens from patients who are undergoing biotin therapy and /or ingesting biotin supplements may contain high levels of biotin.  The higher biotin concentration in these specimens interferes with this Free T4 assay.  Specimens that contain high levels  of biotin may cause false high results for this Free T4 assay.  Please  interpret results in light of the total clinical presentation of the patient.    Marland Kitchen TSH 11/08/2018 2.28  0.35 - 4.50 uIU/mL Final  . Cholesterol 11/08/2018 84  0 - 200 mg/dL Final   ATP III Classification       Desirable:  < 200 mg/dL               Borderline High:  200 - 239 mg/dL          High:  > = 240 mg/dL  . Triglycerides 11/08/2018 85.0  0.0 - 149.0 mg/dL Final   Normal:  <150 mg/dLBorderline High:  150 - 199 mg/dL  . HDL 11/08/2018 46.30  >39.00 mg/dL Final  . VLDL 11/08/2018 17.0  0.0 - 40.0 mg/dL Final  . LDL Cholesterol 11/08/2018 21  0 - 99 mg/dL Final  . Total CHOL/HDL  Ratio 11/08/2018 2   Final                  Men          Women1/2 Average Risk     3.4          3.3Average Risk          5.0          4.42X Average Risk          9.6          7.13X Average Risk          15.0          11.0                      . NonHDL 11/08/2018 37.98   Final   NOTE:  Non-HDL goal should be 30 mg/dL higher than patient's LDL goal (i.e. LDL goal of < 70 mg/dL, would have non-HDL goal of < 100 mg/dL)  . Sodium 11/08/2018 132* 135 - 145 mEq/L Final  . Potassium 11/08/2018 3.9  3.5 - 5.1 mEq/L Final  . Chloride 11/08/2018 100  96 - 112 mEq/L Final  . CO2 11/08/2018 27  19 - 32 mEq/L Final  . Glucose, Bld 11/08/2018 113* 70 - 99 mg/dL Final  . BUN 11/08/2018 17  6 - 23 mg/dL Final  . Creatinine, Ser 11/08/2018 0.68  0.40 - 1.20 mg/dL Final  . Total Bilirubin 11/08/2018 0.5  0.2 - 1.2 mg/dL Final  . Alkaline Phosphatase 11/08/2018 32* 39 - 117 U/L Final  . AST 11/08/2018 22  0 - 37 U/L Final  . ALT 11/08/2018 20  0 - 35 U/L Final  . Total Protein 11/08/2018 6.8  6.0 - 8.3 g/dL Final  . Albumin 11/08/2018 4.2  3.5 - 5.2 g/dL Final  . Calcium 11/08/2018 9.6  8.4 - 10.5 mg/dL Final  . GFR 11/08/2018 84.17  >60.00 mL/min Final  . Hgb A1c MFr Bld 11/08/2018 6.1  4.6 - 6.5 % Final   Glycemic Control Guidelines for People with Diabetes:Non Diabetic:  <6%Goal of Therapy: <7%Additional Action Suggested:  >8%       Past Medical History:  Diagnosis Date  . Abnormal finding on EKG, new anterolateral T-wave inversions  05/14/2012  . Abnormal nuclear stress test, with infero-lateral ischemia 05/14/2012  . Arthritis    "right thumb; all my fingers" (05/13/2012), cervical spondylosis   . Atypical angina ([place]) 05/14/2012  . CAD (coronary artery disease), 05/13/12, with 80% LAD 05/14/2012  . Cancer ([place])    basal cell on facial in L temporal  region    . CMC arthritis, thumb, degenerative   . Complication of anesthesia 12/2009   "had endoscopy; larynx went into spasms; stopped breathing  for 15 seconds" (05/13/2012)  . GERD (gastroesophageal reflux disease) 2011  . Hypercholesteremia   . Hypertension   . S/P angioplasty with stent, LAD 05/13/12 05/14/2012  . Tuberculosis    test +- [facility] [facility] - Dr. [name]- CXR- OK    Past Surgical History:  Procedure Laterality Date  . ANTERIOR CERVICAL DECOMP/DISCECTOMY FUSION N/A 12/13/2013   Procedure: ANTERIOR CERVICAL DECOMPRESSION/DISCECTOMY FUSION 3 LEVELS Cervical four/five,five/six,six/seven. Anterior cervical disectomy and fusion with peek and plate ;  Surgeon: [name], MD;  Location: [facility];  Service: Neurosurgery;  Laterality: N/A;  . CARDIAC CATHETERIZATION N/A 07/02/2015   Procedure: Left Heart Cath and Coronary Angiography;  Surgeon: [name], MD;  Location: [facility];  Service: Cardiovascular;  Laterality: N/A;  . CORONARY ANGIOPLASTY WITH STENT PLACEMENT  05/13/2012   DES to LAD; she has total RCA with left to rt coll. normal LV function done for positive nuc study  . DILATION AND CURETTAGE OF UTERUS  1960's; 1970's; 1980   "probably 3" (05/13/2012)  . FOREARM / WRIST TENDON LESION EXCISION  ?11/2001   "right; did waver to try to get ulnar out of hole that it had cut" (05/13/2012  . INGUINAL HERNIA REPAIR  ~ 1966; 07/21/2002   "right; left" (05/13/2012)  . LEFT HEART CATHETERIZATION WITH CORONARY ANGIOGRAM N/A 05/13/2012   Procedure: LEFT HEART CATHETERIZATION WITH CORONARY ANGIOGRAM;  Surgeon: [name], MD;  Location: [facility] C[facility];  Service: Cardiovascular;  Laterality: N/A;  . OSTEOTOMY AND ULNAR SHORTENING  07/21/2002   "right" (05/13/2012)  . TONSILLECTOMY AND ADENOIDECTOMY  1950's  . TUBAL LIGATION  1980    Family History  Problem Relation Age of Onset  . Hypertension Mother   . Diabetes Mother   . Hyperlipidemia Father   . Heart disease Father   . Stroke Father   . Heart disease Sister   . Diabetes Sister   . Heart disease Brother   . Diabetes Maternal Aunt     Social  History:  reports that she quit smoking about 30 years ago. Her smoking use included cigarettes. She has a 10.00 pack-year smoking history. She has never used smokeless tobacco. She reports current alcohol use of about 12.0 standard drinks of alcohol per week. She reports that she does not use drugs.  Allergies:  Allergies  Allergen Reactions  . Statins Other (See Comments)    "intermittent loss of circulation; hands and arms will go to sleep; feet will get cramps; fatigue" (05/13/2012).  This occurred with Lipitor, Zocor, Crestor 5 mg qd, and she thinks pravastatin as well  . Amlodipine Itching  . Gadolinium Derivatives Itching and Nausea Only    Pt had severe nausea and itching on legs, neck and back, per dr [name] pt needs 13 hour prep before contrast in the future    Allergies as of 11/11/2018      Reactions   Statins Other (See Comments)   "intermittent loss of circulation; hands and arms will go to sleep; feet will get cramps; fatigue" (05/13/2012).  This occurred with Lipitor, Zocor, Crestor 5 mg qd, and she thinks pravastatin as well   Amlodipine Itching   Gadolinium Derivatives Itching, Nausea Only   Pt had severe nausea and itching on legs, neck and back, per dr [name] pt needs 13 hour prep before  contrast in the future      Medication List       Accurate as of November 11, 2018 11:59 PM. If you have any questions, ask your nurse or doctor.        acetaminophen 500 MG tablet Commonly known as: TYLENOL Take 500 mg by mouth every 6 (six) hours as needed for mild pain or headache.   aspirin 81 MG tablet Take 81 mg by mouth daily.   calcium-vitamin D 500-200 MG-UNIT tablet Commonly known as: OSCAL WITH D Take 2 tablets by mouth daily.   carvedilol 6.25 MG tablet Commonly known as: COREG TAKE 1 AND 1/2 TABLETS(9.375 MG) BY MOUTH TWICE DAILY   chlorthalidone 25 MG tablet Commonly known as: HYGROTON Take 0.5 tablets (12.5 mg total) by mouth every other day. What changed:    when to take this  additional instructions   clopidogrel 75 MG tablet Commonly known as: PLAVIX TAKE 1 TABLET(75 MG) BY MOUTH DAILY   diclofenac sodium 1 % Gel Commonly known as: VOLTAREN Apply 2 g topically daily as needed. For pain   Evolocumab 140 MG/ML Soaj Commonly known as: Repatha SureClick Inject 818 mg into the skin every 14 (fourteen) days.   ezetimibe 10 MG tablet Commonly known as: ZETIA TAKE 1 TABLET(10 MG) BY MOUTH DAILY What changed: See the new instructions.   fenofibrate 160 MG tablet TAKE 1 TABLET(160 MG) BY MOUTH DAILY   glucose blood test strip Commonly known as: OneTouch Verio Use to test blood sugar once daily Dx code E11.9   hydrocortisone valerate cream 0.2 % Commonly known as: WESTCORT Apply 1 application topically daily as needed (ITCHING).   irbesartan 300 MG tablet Commonly known as: AVAPRO Take 1 tablet (300 mg total) by mouth daily. OV NEEDED   levothyroxine 25 MCG tablet Commonly known as: SYNTHROID TAKE 1 TABLET(25 MCG) BY MOUTH DAILY BEFORE BREAKFAST   metFORMIN 500 MG 24 hr tablet Commonly known as: GLUCOPHAGE-XR TAKE 2 TABLETS(1000 MG) BY MOUTH DAILY WITH SUPPER   multivitamin with minerals Tabs tablet Take 1 tablet by mouth daily.   nitroGLYCERIN 0.4 MG SL tablet Commonly known as: NITROSTAT PLACE 1 TABLET UNDER THE TONGUE EVERY 5 MINUTES AS NEEDED FOR CHEST PAIN   Vitamin D3 50 MCG (2000 UT) Tabs Take 2,000 Units by mouth 2 (two) times daily.       LABS:  Lab on 11/08/2018  Component Date Value Ref Range Status  . Free T4 11/08/2018 1.28  0.60 - 1.60 ng/dL Final   Comment: Specimens from patients who are undergoing biotin therapy and /or ingesting biotin supplements may contain high levels of biotin.  The higher biotin concentration in these specimens interferes with this Free T4 assay.  Specimens that contain high levels  of biotin may cause false high results for this Free T4 assay.  Please interpret results in  light of the total clinical presentation of the patient.    Marland Kitchen TSH 11/08/2018 2.28  0.35 - 4.50 uIU/mL Final  . Cholesterol 11/08/2018 84  0 - 200 mg/dL Final   ATP III Classification       Desirable:  < 200 mg/dL               Borderline High:  200 - 239 mg/dL          High:  > = 240 mg/dL  . Triglycerides 11/08/2018 85.0  0.0 - 149.0 mg/dL Final   Normal:  <150 mg/dLBorderline High:  150 - 199 mg/dL  .  HDL 11/08/2018 46.30  >39.00 mg/dL Final  . VLDL 11/08/2018 17.0  0.0 - 40.0 mg/dL Final  . LDL Cholesterol 11/08/2018 21  0 - 99 mg/dL Final  . Total CHOL/HDL Ratio 11/08/2018 2   Final                  Men          Women1/2 Average Risk     3.4          3.3Average Risk          5.0          4.42X Average Risk          9.6          7.13X Average Risk          15.0          11.0                      . NonHDL 11/08/2018 37.98   Final   NOTE:  Non-HDL goal should be 30 mg/dL higher than patient's LDL goal (i.e. LDL goal of < 70 mg/dL, would have non-HDL goal of < 100 mg/dL)  . Sodium 11/08/2018 132* 135 - 145 mEq/L Final  . Potassium 11/08/2018 3.9  3.5 - 5.1 mEq/L Final  . Chloride 11/08/2018 100  96 - 112 mEq/L Final  . CO2 11/08/2018 27  19 - 32 mEq/L Final  . Glucose, Bld 11/08/2018 113* 70 - 99 mg/dL Final  . BUN 11/08/2018 17  6 - 23 mg/dL Final  . Creatinine, Ser 11/08/2018 0.68  0.40 - 1.20 mg/dL Final  . Total Bilirubin 11/08/2018 0.5  0.2 - 1.2 mg/dL Final  . Alkaline Phosphatase 11/08/2018 32* 39 - 117 U/L Final  . AST 11/08/2018 22  0 - 37 U/L Final  . ALT 11/08/2018 20  0 - 35 U/L Final  . Total Protein 11/08/2018 6.8  6.0 - 8.3 g/dL Final  . Albumin 11/08/2018 4.2  3.5 - 5.2 g/dL Final  . Calcium 11/08/2018 9.6  8.4 - 10.5 mg/dL Final  . GFR 11/08/2018 84.17  >60.00 mL/min Final  . Hgb A1c MFr Bld 11/08/2018 6.1  4.6 - 6.5 % Final   Glycemic Control Guidelines for People with Diabetes:Non Diabetic:  <6%Goal of Therapy: <7%Additional Action Suggested:  >8%       Review of  Systems    OSTEOPENIA: She had a follow-up bone density in June 2019 but has not had a discussion with her PCP about this Lowest T score is at the right neck femur with a value of -2.4 compared to 2.0, 2 years previously and she has not been on treatment She does however say that she had tried Fosamax in the past and this caused some GI side effects and does not want to try it again   She received RECLAST infusion on 12/15/17   Other active medical issues followed by various physicians:  Hypertension, treated with Avapro, chlorthalidone half tablet and Coreg, Followed by PCP   History of hyperlipidemia with some intolerance to statins, on Fenofibrate, 10 mg Lipitor and also on Zetia.  This is followed by cardiologist in the lipid clinic  Last LDL 95  Lab Results  Component Value Date   CHOL 84 11/08/2018   HDL 46.30 11/08/2018   LDLCALC 21 11/08/2018   LDLDIRECT 130.1 04/04/2014   TRIG 85.0 11/08/2018   CHOLHDL 2 11/08/2018  HYPOTHYROIDISM: TSH level previously has been as high as 5.6  This spontaneously improved but in 05/2018 was high again  At that time she was complaining of some fatigue, constipation, dry skin, some cold intolerance and difficulty losing weight.  She is taking levothyroxine 25 mcg daily since she was symptomatic She has not been able to tell differently whether she has felt better but currently not complaining of unusual fatigue  Her TSH is back to normal and further improved  Lab Results  Component Value Date   TSH 2.28 11/08/2018   TSH 3.65 08/09/2018   TSH 5.17 (H) 06/07/2018   FREET4 1.28 11/08/2018   FREET4 1.14 08/09/2018   FREET4 1.03 06/03/2016      PHYSICAL EXAM:  BP 112/76 (BP Location: Left Arm, Patient Position: Sitting, Cuff Size: Normal)   Temp 99.1 F (37.3 C) (Oral)   Ht 5' 1.5" (1.562 m)   Wt 133 lb (60.3 kg)   BMI 24.72 kg/m      ASSESSMENT:   Prediabetes with impaired fasting glucose and also impaired glucose  tolerance  Her A1c is 6.1 Although patient is concerned about her A1c being slightly higher discussed that overall her A1c has been in the same range for several years now  She has taken metformin because of both having high energy and impaired glucose tolerance and A1c is high at 6.5 in the past She is able to keep her weight down and usually has a good diet Not able to exercise because of back pain Her fasting readings at home are mildly increased above 100 usually but highest 113 which was also the reading in the lab  She agrees to try taking metformin in split doses at lunch and dinner instead both in the evening However discussed checking with the pharmacy to make sure she does not have a recall on her metformin ER in which case she will have to stop this  HYPOTHYROIDISM: TSH is normal She may have been symptomatic before starting supplement but difficult to be sure  OSTEOPENIA: She will get Reclast every 2 years and the next injection will be in 11/2019  Lipids: LDL is 21, managed by other physicians  PLAN:   As above for diabetes  Continue levothyroxine 25 mcg as before  Follow-up in  4 months    Shemaiah Round 11/12/2018, 11:24 AM

## 2018-11-11 NOTE — Addendum Note (Signed)
Addended by: Annita Brod on: 11/11/2018 09:39 AM   Modules accepted: Orders

## 2018-11-18 ENCOUNTER — Other Ambulatory Visit: Payer: Self-pay | Admitting: Cardiovascular Disease

## 2018-11-22 ENCOUNTER — Other Ambulatory Visit: Payer: Self-pay | Admitting: Cardiovascular Disease

## 2018-11-22 NOTE — Telephone Encounter (Signed)
Rx(s) sent to pharmacy electronically.  

## 2018-11-30 DIAGNOSIS — H353112 Nonexudative age-related macular degeneration, right eye, intermediate dry stage: Secondary | ICD-10-CM | POA: Diagnosis not present

## 2018-12-09 DIAGNOSIS — E785 Hyperlipidemia, unspecified: Secondary | ICD-10-CM | POA: Diagnosis not present

## 2018-12-10 DIAGNOSIS — E782 Mixed hyperlipidemia: Secondary | ICD-10-CM | POA: Diagnosis not present

## 2018-12-10 DIAGNOSIS — I2510 Atherosclerotic heart disease of native coronary artery without angina pectoris: Secondary | ICD-10-CM | POA: Diagnosis not present

## 2018-12-10 DIAGNOSIS — Z7984 Long term (current) use of oral hypoglycemic drugs: Secondary | ICD-10-CM | POA: Diagnosis not present

## 2018-12-10 DIAGNOSIS — E1169 Type 2 diabetes mellitus with other specified complication: Secondary | ICD-10-CM | POA: Diagnosis not present

## 2018-12-10 DIAGNOSIS — R0600 Dyspnea, unspecified: Secondary | ICD-10-CM | POA: Diagnosis not present

## 2018-12-10 DIAGNOSIS — I10 Essential (primary) hypertension: Secondary | ICD-10-CM | POA: Diagnosis not present

## 2018-12-10 LAB — HEPATIC FUNCTION PANEL
ALT: 15 IU/L (ref 0–32)
AST: 19 IU/L (ref 0–40)
Albumin: 4.3 g/dL (ref 3.7–4.7)
Alkaline Phosphatase: 38 IU/L — ABNORMAL LOW (ref 39–117)
Bilirubin Total: 0.3 mg/dL (ref 0.0–1.2)
Bilirubin, Direct: 0.11 mg/dL (ref 0.00–0.40)
Total Protein: 6.9 g/dL (ref 6.0–8.5)

## 2018-12-10 LAB — LIPID PANEL
Chol/HDL Ratio: 3.6 ratio (ref 0.0–4.4)
Cholesterol, Total: 145 mg/dL (ref 100–199)
HDL: 40 mg/dL (ref >39)
LDL Calculated: 67 mg/dL (ref 0–99)
Triglycerides: 190 mg/dL — ABNORMAL HIGH (ref 0–149)
VLDL Cholesterol Cal: 38 mg/dL (ref 5–40)

## 2018-12-16 DIAGNOSIS — E782 Mixed hyperlipidemia: Secondary | ICD-10-CM | POA: Diagnosis not present

## 2018-12-16 DIAGNOSIS — E559 Vitamin D deficiency, unspecified: Secondary | ICD-10-CM | POA: Diagnosis not present

## 2018-12-16 DIAGNOSIS — Z79899 Other long term (current) drug therapy: Secondary | ICD-10-CM | POA: Diagnosis not present

## 2018-12-24 ENCOUNTER — Other Ambulatory Visit: Payer: Self-pay | Admitting: Cardiovascular Disease

## 2018-12-28 ENCOUNTER — Telehealth: Payer: Self-pay | Admitting: Endocrinology

## 2018-12-28 NOTE — Telephone Encounter (Signed)
1. Are you taking any type of steroids? No  2. Are you sick or becoming sick? No, currently recovering from [place]. Wt is 129, still fatigue but denies any symptoms. In fact last fever was 11/21/18  3. Is this the first elevated blood sugar reading? See table below. States she has not been checking CBG's consistently because she has been very fatigued and still recovering.  4. Have you tried to correct it with insulin? N/A  5. Have you been taking/taken today all of the prescribed medications? Yes, until today  6. What is your current diabetic insulin or oral medication dosage? Metformin 2 tablets (1000mg  total) with supper. However, states she took 1 tablet (500mg  total) this morning and is planning to take 1 tablet (500 mg total) this evening. She would like to know if she should continue splitting the doses for better control OR take as previously prescribed?   Date Time Reading Notes       8/11 702a 113   8/10 741a 109   8/6 1025p 118   8/3 818a 107                         Please advise

## 2018-12-28 NOTE — Telephone Encounter (Signed)
Pt returned call. Informed of Dr. Ronnie Derby recommendations. Verbalized acceptance and understanding.

## 2018-12-28 NOTE — Telephone Encounter (Signed)
Patient ph#[phone]  called re: patient had Covid 19 but her blood sugars have running a little high in the mornings (twice 113). On 7/30 blood sugars were 121. Please call patient at the above ph# to advise.

## 2018-12-28 NOTE — Telephone Encounter (Signed)
High blood sugars are likely to be from stress of infection Since blood sugars are gradually improving no need to add another medication.  Blood sugar should improve when she is able to be very active again

## 2018-12-28 NOTE — Telephone Encounter (Signed)
LVM requesting returned call 

## 2018-12-30 DIAGNOSIS — H353112 Nonexudative age-related macular degeneration, right eye, intermediate dry stage: Secondary | ICD-10-CM | POA: Diagnosis not present

## 2019-01-11 ENCOUNTER — Other Ambulatory Visit: Payer: Self-pay | Admitting: Cardiovascular Disease

## 2019-01-11 DIAGNOSIS — Z1231 Encounter for screening mammogram for malignant neoplasm of breast: Secondary | ICD-10-CM | POA: Diagnosis not present

## 2019-01-12 ENCOUNTER — Other Ambulatory Visit: Payer: Self-pay | Admitting: Geriatric Medicine

## 2019-01-12 ENCOUNTER — Ambulatory Visit
Admission: RE | Admit: 2019-01-12 | Discharge: 2019-01-12 | Disposition: A | Discharge status: Home / Self Care | Payer: Medicare Other | Source: Ambulatory Visit | Attending: Geriatric Medicine | Admitting: Geriatric Medicine

## 2019-01-12 DIAGNOSIS — R059 Cough, unspecified: Secondary | ICD-10-CM

## 2019-01-12 DIAGNOSIS — R05 Cough: Secondary | ICD-10-CM

## 2019-01-12 DIAGNOSIS — U071 COVID-19: Secondary | ICD-10-CM | POA: Diagnosis not present

## 2019-01-15 ENCOUNTER — Other Ambulatory Visit: Payer: Self-pay | Admitting: Cardiovascular Disease

## 2019-01-29 DIAGNOSIS — H353112 Nonexudative age-related macular degeneration, right eye, intermediate dry stage: Secondary | ICD-10-CM | POA: Diagnosis not present

## 2019-02-01 DIAGNOSIS — E1169 Type 2 diabetes mellitus with other specified complication: Secondary | ICD-10-CM | POA: Diagnosis not present

## 2019-02-01 DIAGNOSIS — I10 Essential (primary) hypertension: Secondary | ICD-10-CM | POA: Diagnosis not present

## 2019-02-01 DIAGNOSIS — I2510 Atherosclerotic heart disease of native coronary artery without angina pectoris: Secondary | ICD-10-CM | POA: Diagnosis not present

## 2019-02-01 DIAGNOSIS — E782 Mixed hyperlipidemia: Secondary | ICD-10-CM | POA: Diagnosis not present

## 2019-02-04 DIAGNOSIS — M6281 Muscle weakness (generalized): Secondary | ICD-10-CM | POA: Diagnosis not present

## 2019-02-09 DIAGNOSIS — M6281 Muscle weakness (generalized): Secondary | ICD-10-CM | POA: Diagnosis not present

## 2019-02-16 DIAGNOSIS — M6281 Muscle weakness (generalized): Secondary | ICD-10-CM | POA: Diagnosis not present

## 2019-02-16 DIAGNOSIS — L650 Telogen effluvium: Secondary | ICD-10-CM | POA: Diagnosis not present

## 2019-02-16 DIAGNOSIS — Z23 Encounter for immunization: Secondary | ICD-10-CM | POA: Diagnosis not present

## 2019-02-22 ENCOUNTER — Other Ambulatory Visit: Payer: Self-pay | Admitting: Endocrinology

## 2019-02-23 DIAGNOSIS — M6281 Muscle weakness (generalized): Secondary | ICD-10-CM | POA: Diagnosis not present

## 2019-02-28 DIAGNOSIS — H353112 Nonexudative age-related macular degeneration, right eye, intermediate dry stage: Secondary | ICD-10-CM | POA: Diagnosis not present

## 2019-03-01 DIAGNOSIS — M6281 Muscle weakness (generalized): Secondary | ICD-10-CM | POA: Diagnosis not present

## 2019-03-02 ENCOUNTER — Other Ambulatory Visit: Payer: Self-pay | Admitting: Geriatric Medicine

## 2019-03-02 ENCOUNTER — Ambulatory Visit
Admission: RE | Admit: 2019-03-02 | Discharge: 2019-03-02 | Disposition: A | Discharge status: Home / Self Care | Payer: Medicare Other | Source: Ambulatory Visit | Attending: Geriatric Medicine | Admitting: Geriatric Medicine

## 2019-03-02 DIAGNOSIS — E1169 Type 2 diabetes mellitus with other specified complication: Secondary | ICD-10-CM | POA: Diagnosis not present

## 2019-03-02 DIAGNOSIS — I10 Essential (primary) hypertension: Secondary | ICD-10-CM | POA: Diagnosis not present

## 2019-03-02 DIAGNOSIS — R059 Cough, unspecified: Secondary | ICD-10-CM

## 2019-03-02 DIAGNOSIS — Z79899 Other long term (current) drug therapy: Secondary | ICD-10-CM | POA: Diagnosis not present

## 2019-03-02 DIAGNOSIS — E782 Mixed hyperlipidemia: Secondary | ICD-10-CM | POA: Diagnosis not present

## 2019-03-02 DIAGNOSIS — R05 Cough: Secondary | ICD-10-CM

## 2019-03-02 DIAGNOSIS — L659 Nonscarring hair loss, unspecified: Secondary | ICD-10-CM | POA: Diagnosis not present

## 2019-03-08 ENCOUNTER — Other Ambulatory Visit: Payer: Self-pay | Admitting: Geriatric Medicine

## 2019-03-08 DIAGNOSIS — R9389 Abnormal findings on diagnostic imaging of other specified body structures: Secondary | ICD-10-CM

## 2019-03-09 DIAGNOSIS — M6281 Muscle weakness (generalized): Secondary | ICD-10-CM | POA: Diagnosis not present

## 2019-03-13 DIAGNOSIS — Z79899 Other long term (current) drug therapy: Secondary | ICD-10-CM | POA: Diagnosis not present

## 2019-03-13 DIAGNOSIS — E785 Hyperlipidemia, unspecified: Secondary | ICD-10-CM | POA: Diagnosis not present

## 2019-03-14 ENCOUNTER — Other Ambulatory Visit: Payer: Medicare Other | Admitting: *Deleted

## 2019-03-14 ENCOUNTER — Other Ambulatory Visit: Payer: Self-pay

## 2019-03-14 ENCOUNTER — Other Ambulatory Visit (INDEPENDENT_AMBULATORY_CARE_PROVIDER_SITE_OTHER): Payer: Medicare Other

## 2019-03-14 DIAGNOSIS — R7303 Prediabetes: Secondary | ICD-10-CM

## 2019-03-14 DIAGNOSIS — Z780 Asymptomatic menopausal state: Secondary | ICD-10-CM

## 2019-03-14 DIAGNOSIS — Z79899 Other long term (current) drug therapy: Secondary | ICD-10-CM | POA: Diagnosis not present

## 2019-03-14 DIAGNOSIS — E785 Hyperlipidemia, unspecified: Secondary | ICD-10-CM | POA: Diagnosis not present

## 2019-03-14 DIAGNOSIS — M8580 Other specified disorders of bone density and structure, unspecified site: Secondary | ICD-10-CM

## 2019-03-14 DIAGNOSIS — E039 Hypothyroidism, unspecified: Secondary | ICD-10-CM | POA: Diagnosis not present

## 2019-03-14 LAB — TSH: TSH: 2.61 u[IU]/mL (ref 0.35–4.50)

## 2019-03-14 LAB — VITAMIN D 25 HYDROXY (VIT D DEFICIENCY, FRACTURES): VITD: 61.2 ng/mL (ref 30.00–100.00)

## 2019-03-14 LAB — HEMOGLOBIN AND HEMATOCRIT, BLOOD
Hematocrit: 40.3 % (ref 34.0–46.6)
Hemoglobin: 13.5 g/dL (ref 11.1–15.9)

## 2019-03-14 LAB — COMPREHENSIVE METABOLIC PANEL
ALT: 20 U/L (ref 0–35)
AST: 21 U/L (ref 0–37)
Albumin: 4.4 g/dL (ref 3.5–5.2)
Alkaline Phosphatase: 36 U/L — ABNORMAL LOW (ref 39–117)
BUN: 17 mg/dL (ref 6–23)
CO2: 29 mEq/L (ref 19–32)
Calcium: 10 mg/dL (ref 8.4–10.5)
Chloride: 97 mEq/L (ref 96–112)
Creatinine, Ser: 0.71 mg/dL (ref 0.40–1.20)
GFR: 80.01 mL/min (ref >60.00)
Glucose, Bld: 125 mg/dL — ABNORMAL HIGH (ref 70–99)
Potassium: 3.7 mEq/L (ref 3.5–5.1)
Sodium: 133 mEq/L — ABNORMAL LOW (ref 135–145)
Total Bilirubin: 0.5 mg/dL (ref 0.2–1.2)
Total Protein: 7.5 g/dL (ref 6.0–8.3)

## 2019-03-14 LAB — LIPID PANEL
Chol/HDL Ratio: 3.1 ratio (ref 0.0–4.4)
Cholesterol, Total: 154 mg/dL (ref 100–199)
HDL: 50 mg/dL (ref >39)
LDL Chol Calc (NIH): 74 mg/dL (ref 0–99)
Triglycerides: 176 mg/dL — ABNORMAL HIGH (ref 0–149)
VLDL Cholesterol Cal: 30 mg/dL (ref 5–40)

## 2019-03-14 LAB — HEMOGLOBIN A1C: Hgb A1c MFr Bld: 6 % (ref 4.6–6.5)

## 2019-03-14 LAB — T4, FREE: Free T4: 1.18 ng/dL (ref 0.60–1.60)

## 2019-03-14 NOTE — Telephone Encounter (Signed)
Pt is over in the office at [place] she states that she is supposed to get follow up labs done this month. She had them done erronusly in July so we do not know if her insurance will cover them. Pt is insisting to have them done but since I told her that they may not be covered she is asking when they will be covered. I informed pt that she will need to call her insurance and explain the situation to them and ask them. She states ok thanks.

## 2019-03-14 NOTE — Addendum Note (Signed)
Addended by: Waylan Rocher on: 03/14/2019 08:52 AM   Modules accepted: Orders

## 2019-03-15 ENCOUNTER — Encounter (INDEPENDENT_AMBULATORY_CARE_PROVIDER_SITE_OTHER): Payer: Self-pay

## 2019-03-15 ENCOUNTER — Ambulatory Visit
Admission: RE | Admit: 2019-03-15 | Discharge: 2019-03-15 | Disposition: A | Discharge status: Home / Self Care | Payer: Medicare Other | Source: Ambulatory Visit | Attending: Geriatric Medicine | Admitting: Geriatric Medicine

## 2019-03-15 DIAGNOSIS — R9389 Abnormal findings on diagnostic imaging of other specified body structures: Secondary | ICD-10-CM

## 2019-03-15 DIAGNOSIS — J479 Bronchiectasis, uncomplicated: Secondary | ICD-10-CM | POA: Diagnosis not present

## 2019-03-15 MED ORDER — IOPAMIDOL (ISOVUE-300) INJECTION 61%
75.0000 mL | Freq: Once | INTRAVENOUS | Status: AC | PRN
  Administered 2019-03-15: 75 mL via INTRAVENOUS

## 2019-03-17 ENCOUNTER — Ambulatory Visit (INDEPENDENT_AMBULATORY_CARE_PROVIDER_SITE_OTHER): Payer: Medicare Other | Admitting: Endocrinology

## 2019-03-17 ENCOUNTER — Other Ambulatory Visit: Payer: Self-pay

## 2019-03-17 ENCOUNTER — Encounter: Payer: Self-pay | Admitting: Endocrinology

## 2019-03-17 ENCOUNTER — Telehealth: Payer: Self-pay | Admitting: Cardiovascular Disease

## 2019-03-17 DIAGNOSIS — I2510 Atherosclerotic heart disease of native coronary artery without angina pectoris: Secondary | ICD-10-CM | POA: Diagnosis not present

## 2019-03-17 DIAGNOSIS — I10 Essential (primary) hypertension: Secondary | ICD-10-CM | POA: Diagnosis not present

## 2019-03-17 DIAGNOSIS — E782 Mixed hyperlipidemia: Secondary | ICD-10-CM | POA: Diagnosis not present

## 2019-03-17 DIAGNOSIS — E039 Hypothyroidism, unspecified: Secondary | ICD-10-CM

## 2019-03-17 DIAGNOSIS — E1169 Type 2 diabetes mellitus with other specified complication: Secondary | ICD-10-CM | POA: Diagnosis not present

## 2019-03-17 DIAGNOSIS — R7303 Prediabetes: Secondary | ICD-10-CM | POA: Diagnosis not present

## 2019-03-17 NOTE — Progress Notes (Signed)
Patient ID: Brittany Figueroa, female   DOB: 05/11/1941, 78 y.o.   MRN: [id number]           Chief complaint: Endocrinology follow-up  Today's office visit was provided via telemedicine using audio technique The patient was explained the limitations of evaluation and management by telemedicine and the availability of in person appointments.  The patient understood the limitations and agreed to proceed. Patient also understood that the telehealth visit is billable. . Location of the patient: Patient's home . Location of the provider: Physician office Only the patient and myself were participating in the encounter    History of Present Illness:  PREVIOUS history: She may have been told about 10 years ago that she had prediabetes and reportedly had a normal glucose tolerance test. She was told by her PCP that she has type 2 diabetes based on an A1c of 6.5% in 07/2013 She has not had any other A1c is subsequently above normal, normal range and previous lab 6.2 or less She also has not had any abnormal blood sugars except a glucose of 125 in 3/16  Has history of increased fasting blood sugars ranging from 108-125 since about 2012  GLUCOSE TOLERANCE test on 05/23/15: Fasting glucose 110, two-hour glucose 192  RECENT history:   She has been intermittently on metformin ER since about 08/2015 In 1/18 when A1c was 6.5 and glucose 133 fasting she was told to start back on metformin and also improve her diet She was taking 1500 mg a day but could not tolerate this because of feeling of bloating  Current regimen: Metformin ER 500 mg 2 tablets a day  A1c is 6%, previously 6.1  Current management and blood sugars:    She is checking her blood sugars at home occasionally and mostly in the mornings  Although she has a couple of readings below 100 they are usually just over 100 at home  Blood sugar in the office was 135 after having coffee  She has lost some weight because of having Covid  infection this summer  However she is trying to be a little active and is trying to do some physical therapy  As before she is usually trying to keep her portions and carbohydrates controlled  Again she is trying to take both metformin in the evenings together and now she does not have any GI side effects with this  Home readings using the FreeStyle   Average blood sugar for the last 30 days was 106 Blood sugar range 82-125, highest reading about 12 noon  Weight history: Previous range 139-143  Wt Readings from Last 3 Encounters:  11/11/18 133 lb (60.3 kg)  10/06/18 134 lb 8 oz (61 kg)  08/11/18 137 lb 3.2 oz (62.2 kg)    Lab Results  Component Value Date   HGBA1C 6.0 03/14/2019   HGBA1C 6.1 11/08/2018   HGBA1C 5.9 06/07/2018   Lab Results  Component Value Date   LDLCALC 74 03/14/2019   CREATININE 0.71 03/14/2019   HYPOTHYROIDISM and other problems reviewed today: See review of systems    Lab on 03/14/2019  Component Date Value Ref Range Status  . Cholesterol, Total 03/14/2019 154  100 - 199 mg/dL Final  . Triglycerides 03/14/2019 176* 0 - 149 mg/dL Final  . HDL 03/14/2019 50  >39 mg/dL Final  . VLDL Cholesterol Cal 03/14/2019 30  5 - 40 mg/dL Final  . LDL Chol Calc (NIH) 03/14/2019 74  0 - 99 mg/dL Final  . Chol/HDL  Ratio 03/14/2019 3.1  0.0 - 4.4 ratio Final   Comment:                                   T. Chol/HDL Ratio                                             Men  Women                               1/2 Avg.Risk  3.4    3.3                                   Avg.Risk  5.0    4.4                                2X Avg.Risk  9.6    7.1                                3X Avg.Risk 23.4   11.0   . Hemoglobin 03/13/2019 13.5  11.1 - 15.9 g/dL Final  . Hematocrit 03/13/2019 40.3  34.0 - 46.6 % Final  Lab on 03/14/2019  Component Date Value Ref Range Status  . Free T4 03/14/2019 1.18  0.60 - 1.60 ng/dL Final   Comment: Specimens from patients who are undergoing  biotin therapy and /or ingesting biotin supplements may contain high levels of biotin.  The higher biotin concentration in these specimens interferes with this Free T4 assay.  Specimens that contain high levels  of biotin may cause false high results for this Free T4 assay.  Please interpret results in light of the total clinical presentation of the patient.    Marland Kitchen TSH 03/14/2019 2.61  0.35 - 4.50 uIU/mL Final  . VITD 03/14/2019 61.20  30.00 - 100.00 ng/mL Final  . Sodium 03/14/2019 133* 135 - 145 mEq/L Final  . Potassium 03/14/2019 3.7  3.5 - 5.1 mEq/L Final  . Chloride 03/14/2019 97  96 - 112 mEq/L Final  . CO2 03/14/2019 29  19 - 32 mEq/L Final  . Glucose, Bld 03/14/2019 125* 70 - 99 mg/dL Final  . BUN 03/14/2019 17  6 - 23 mg/dL Final  . Creatinine, Ser 03/14/2019 0.71  0.40 - 1.20 mg/dL Final  . Total Bilirubin 03/14/2019 0.5  0.2 - 1.2 mg/dL Final  . Alkaline Phosphatase 03/14/2019 36* 39 - 117 U/L Final  . AST 03/14/2019 21  0 - 37 U/L Final  . ALT 03/14/2019 20  0 - 35 U/L Final  . Total Protein 03/14/2019 7.5  6.0 - 8.3 g/dL Final  . Albumin 03/14/2019 4.4  3.5 - 5.2 g/dL Final  . Calcium 03/14/2019 10.0  8.4 - 10.5 mg/dL Final  . GFR 03/14/2019 80.01  >60.00 mL/min Final  . Hgb A1c MFr Bld 03/14/2019 6.0  4.6 - 6.5 % Final   Glycemic Control Guidelines for People with Diabetes:Non Diabetic:  <6%Goal of Therapy: <7%Additional Action Suggested:  >8%       Past Medical History:  Diagnosis  Date  . Abnormal finding on EKG, new anterolateral T-wave inversions  05/14/2012  . Abnormal nuclear stress test, with infero-lateral ischemia 05/14/2012  . Arthritis    "right thumb; all my fingers" (05/13/2012), cervical spondylosis   . Atypical angina ([place]) 05/14/2012  . CAD (coronary artery disease), 05/13/12, with 80% LAD 05/14/2012  . Cancer ([place])    basal cell on facial in L temporal region    . CMC arthritis, thumb, degenerative   . Complication of anesthesia 12/2009   "had  endoscopy; larynx went into spasms; stopped breathing for 15 seconds" (05/13/2012)  . GERD (gastroesophageal reflux disease) 2011  . Hypercholesteremia   . Hypertension   . S/P angioplasty with stent, LAD 05/13/12 05/14/2012  . Tuberculosis    test +- [place] [facility] - Dr. [name]- CXR- OK    Past Surgical History:  Procedure Laterality Date  . ANTERIOR CERVICAL DECOMP/DISCECTOMY FUSION N/A 12/13/2013   Procedure: ANTERIOR CERVICAL DECOMPRESSION/DISCECTOMY FUSION 3 LEVELS Cervical four/five,five/six,six/seven. Anterior cervical disectomy and fusion with peek and plate ;  Surgeon: [name], MD;  Location: [facility];  Service: Neurosurgery;  Laterality: N/A;  . CARDIAC CATHETERIZATION N/A 07/02/2015   Procedure: Left Heart Cath and Coronary Angiography;  Surgeon: [name], MD;  Location: [place] CV LAB;  Service: Cardiovascular;  Laterality: N/A;  . CORONARY ANGIOPLASTY WITH STENT PLACEMENT  05/13/2012   DES to LAD; she has total RCA with left to rt coll. normal LV function done for positive nuc study  . DILATION AND CURETTAGE OF UTERUS  1960's; 1970's; 1980   "probably 3" (05/13/2012)  . FOREARM / WRIST TENDON LESION EXCISION  ?11/2001   "right; did waver to try to get ulnar out of hole that it had cut" (05/13/2012  . INGUINAL HERNIA REPAIR  ~ 1966; 07/21/2002   "right; left" (05/13/2012)  . LEFT HEART CATHETERIZATION WITH CORONARY ANGIOGRAM N/A 05/13/2012   Procedure: LEFT HEART CATHETERIZATION WITH CORONARY ANGIOGRAM;  Surgeon: [name], MD;  Location: [facility] CATH LAB;  Service: Cardiovascular;  Laterality: N/A;  . OSTEOTOMY AND ULNAR SHORTENING  07/21/2002   "right" (05/13/2012)  . TONSILLECTOMY AND ADENOIDECTOMY  1950's  . TUBAL LIGATION  1980    Family History  Problem Relation Age of Onset  . Hypertension Mother   . Diabetes Mother   . Hyperlipidemia Father   . Heart disease Father   . Stroke Father   . Heart disease Sister   . Diabetes Sister   . Heart disease  Brother   . Diabetes Maternal Aunt     Social History:  reports that she quit smoking about 30 years ago. Her smoking use included cigarettes. She has a 10.00 pack-year smoking history. She has never used smokeless tobacco. She reports current alcohol use of about 12.0 standard drinks of alcohol per week. She reports that she does not use drugs.  Allergies:  Allergies  Allergen Reactions  . Statins Other (See Comments)    "intermittent loss of circulation; hands and arms will go to sleep; feet will get cramps; fatigue" (05/13/2012).  This occurred with Lipitor, Zocor, Crestor 5 mg qd, and she thinks pravastatin as well  . Amlodipine Itching  . Gadolinium Derivatives Itching and Nausea Only    Pt had severe nausea and itching on legs, neck and back, per dr [name] pt needs 13 hour prep before contrast in the future    Allergies as of 03/17/2019      Reactions   Statins Other (See  Comments)   "intermittent loss of circulation; hands and arms will go to sleep; feet will get cramps; fatigue" (05/13/2012).  This occurred with Lipitor, Zocor, Crestor 5 mg qd, and she thinks pravastatin as well   Amlodipine Itching   Gadolinium Derivatives Itching, Nausea Only   Pt had severe nausea and itching on legs, neck and back, per dr [name] pt needs 13 hour prep before contrast in the future      Medication List       Accurate as of March 17, 2019  1:08 PM. If you have any questions, ask your nurse or doctor.        acetaminophen 500 MG tablet Commonly known as: TYLENOL Take 500 mg by mouth every 6 (six) hours as needed for mild pain or headache.   aspirin 81 MG tablet Take 81 mg by mouth daily.   calcium-vitamin D 500-200 MG-UNIT tablet Commonly known as: OSCAL WITH D Take 2 tablets by mouth daily.   carvedilol 6.25 MG tablet Commonly known as: COREG TAKE 1 AND 1/2 TABLETS(9.375 MG) BY MOUTH TWICE DAILY   chlorthalidone 25 MG tablet Commonly known as: HYGROTON Take 0.5 tablets  (12.5 mg total) by mouth daily.   clopidogrel 75 MG tablet Commonly known as: PLAVIX TAKE 1 TABLET(75 MG) BY MOUTH DAILY   diclofenac sodium 1 % Gel Commonly known as: VOLTAREN Apply 2 g topically daily as needed. For pain   Evolocumab 140 MG/ML Soaj Commonly known as: Repatha SureClick Inject [id number] mg into the skin every 14 (fourteen) days.   ezetimibe 10 MG tablet Commonly known as: ZETIA TAKE 1 TABLET(10 MG) BY MOUTH DAILY   fenofibrate 160 MG tablet TAKE 1 TABLET(160 MG) BY MOUTH DAILY   glucose blood test strip Commonly known as: OneTouch Verio Use to test blood sugar once daily Dx code E11.9   hydrocortisone valerate cream 0.2 % Commonly known as: WESTCORT Apply 1 application topically daily as needed (ITCHING).   irbesartan 300 MG tablet Commonly known as: AVAPRO Take 1 tablet (300 mg total) by mouth daily.   levothyroxine 25 MCG tablet Commonly known as: SYNTHROID TAKE 1 TABLET(25 MCG) BY MOUTH DAILY BEFORE BREAKFAST   metFORMIN 500 MG 24 hr tablet Commonly known as: GLUCOPHAGE-XR TAKE 2 TABLETS(1000 MG) BY MOUTH DAILY WITH SUPPER   multivitamin with minerals Tabs tablet Take 1 tablet by mouth daily.   nitroGLYCERIN 0.4 MG SL tablet Commonly known as: NITROSTAT PLACE 1 TABLET UNDER THE TONGUE EVERY 5 MINUTES AS NEEDED FOR CHEST PAIN   Vitamin D3 50 MCG (2000 UT) Tabs Take 2,000 Units by mouth 2 (two) times daily.       LABS:  Lab on 03/14/2019  Component Date Value Ref Range Status  . Cholesterol, Total 03/14/2019 154  100 - 199 mg/dL Final  . Triglycerides 03/14/2019 176* 0 - 149 mg/dL Final  . HDL 03/14/2019 50  >39 mg/dL Final  . VLDL Cholesterol Cal 03/14/2019 30  5 - 40 mg/dL Final  . LDL Chol Calc (NIH) 03/14/2019 74  0 - 99 mg/dL Final  . Chol/HDL Ratio 03/14/2019 3.1  0.0 - 4.4 ratio Final   Comment:                                   T. Chol/HDL Ratio  Men  Women                                1/2 Avg.Risk  3.4    3.3                                   Avg.Risk  5.0    4.4                                2X Avg.Risk  9.6    7.1                                3X Avg.Risk 23.4   11.0   . Hemoglobin 03/13/2019 13.5  11.1 - 15.9 g/dL Final  . Hematocrit 03/13/2019 40.3  34.0 - 46.6 % Final  Lab on 03/14/2019  Component Date Value Ref Range Status  . Free T4 03/14/2019 1.18  0.60 - 1.60 ng/dL Final   Comment: Specimens from patients who are undergoing biotin therapy and /or ingesting biotin supplements may contain high levels of biotin.  The higher biotin concentration in these specimens interferes with this Free T4 assay.  Specimens that contain high levels  of biotin may cause false high results for this Free T4 assay.  Please interpret results in light of the total clinical presentation of the patient.    Marland Kitchen TSH 03/14/2019 2.61  0.35 - 4.50 uIU/mL Final  . VITD 03/14/2019 61.20  30.00 - 100.00 ng/mL Final  . Sodium 03/14/2019 133* 135 - 145 mEq/L Final  . Potassium 03/14/2019 3.7  3.5 - 5.1 mEq/L Final  . Chloride 03/14/2019 97  96 - 112 mEq/L Final  . CO2 03/14/2019 29  19 - 32 mEq/L Final  . Glucose, Bld 03/14/2019 125* 70 - 99 mg/dL Final  . BUN 03/14/2019 17  6 - 23 mg/dL Final  . Creatinine, Ser 03/14/2019 0.71  0.40 - 1.20 mg/dL Final  . Total Bilirubin 03/14/2019 0.5  0.2 - 1.2 mg/dL Final  . Alkaline Phosphatase 03/14/2019 36* 39 - 117 U/L Final  . AST 03/14/2019 21  0 - 37 U/L Final  . ALT 03/14/2019 20  0 - 35 U/L Final  . Total Protein 03/14/2019 7.5  6.0 - 8.3 g/dL Final  . Albumin 03/14/2019 4.4  3.5 - 5.2 g/dL Final  . Calcium 03/14/2019 10.0  8.4 - 10.5 mg/dL Final  . GFR 03/14/2019 80.01  >60.00 mL/min Final  . Hgb A1c MFr Bld 03/14/2019 6.0  4.6 - 6.5 % Final   Glycemic Control Guidelines for People with Diabetes:Non Diabetic:  <6%Goal of Therapy: <7%Additional Action Suggested:  >8%       Review of Systems    OSTEOPENIA: She had a follow-up bone density  in June 2019 but has not had a discussion with her PCP about this Lowest T score is at the right neck femur with a value of -2.4 compared to 2.0, 2 years previously and she has not been on treatment She does however say that she had tried Fosamax in the past and this caused some GI side effects and does not want to try it again   She received RECLAST infusion on 12/15/17     Hypertension, treated with Avapro,  chlorthalidone half tablet and Coreg, Followed by PCP   History of hyperlipidemia with some intolerance to statins, on Fenofibrate, 10 mg Lipitor and also on Zetia.  This is followed by cardiologist in the lipid clinic  Fasting lipids as follows, has mild increase in triglycerides  Lab Results  Component Value Date   CHOL 154 03/14/2019   HDL 50 03/14/2019   LDLCALC 74 03/14/2019   LDLDIRECT 130.1 04/04/2014   TRIG 176 (H) 03/14/2019   CHOLHDL 3.1 03/14/2019      HYPOTHYROIDISM: TSH level previously has been as high as 5.6  This spontaneously improved but in 05/2018 was high again  At that time she was complaining of some fatigue, constipation, dry skin, some cold intolerance and difficulty losing weight.  She is taking levothyroxine 25 mcg daily since she appeared to be symptomatic She has not been able to tell differently whether she has felt better with supplementation  She has had some fatigue but she has had residual symptoms from her Covid infection  Her TSH is back to normal and stable at 2.6  Lab Results  Component Value Date   TSH 2.61 03/14/2019   TSH 2.28 11/08/2018   TSH 3.65 08/09/2018   FREET4 1.18 03/14/2019   FREET4 1.28 11/08/2018   FREET4 1.14 08/09/2018      PHYSICAL EXAM:  There were no vitals taken for this visit.     ASSESSMENT:   Prediabetes with impaired fasting glucose and also impaired glucose tolerance  Her A1c is stable at 6, previously 6.1 She is on 1000 mg of Metformin ER  She only checks fasting readings at home and  these are mildly increased above 100 Lab glucose 125 done late morning and after coffee She is tolerating Metformin now Has had difficulty with exercise, doing some therapy now  She agrees to continue Metformin and follow-up in 4 months   HYPOTHYROIDISM: TSH is normal on treatment She may have been symptomatic before starting 25 mcg levothyroxine  OSTEOPENIA: She will get Reclast every 2 years and the next injection will be in 11/2019  Lipids: LDL controlled, minimal increase in triglycerides  Vitamin D level is normal  Mild hyponatremia related to thiazide diuretic  PLAN:   As above for diabetes  Continue levothyroxine 25 mcg as before  No change in vitamin D supplements  Follow-up in  4 months  Duration of telephone encounter =8 minutes   Elayne Snare 03/17/2019, 1:08 PM

## 2019-03-17 NOTE — Telephone Encounter (Signed)
New message     Pt is calling to speak with Brittany Figueroa in the Pharmacy about Labs she had done      Please call back

## 2019-03-24 DIAGNOSIS — H2513 Age-related nuclear cataract, bilateral: Secondary | ICD-10-CM | POA: Diagnosis not present

## 2019-03-24 DIAGNOSIS — H353111 Nonexudative age-related macular degeneration, right eye, early dry stage: Secondary | ICD-10-CM | POA: Diagnosis not present

## 2019-03-24 DIAGNOSIS — H353121 Nonexudative age-related macular degeneration, left eye, early dry stage: Secondary | ICD-10-CM | POA: Diagnosis not present

## 2019-03-24 DIAGNOSIS — H10413 Chronic giant papillary conjunctivitis, bilateral: Secondary | ICD-10-CM | POA: Diagnosis not present

## 2019-03-30 DIAGNOSIS — H353112 Nonexudative age-related macular degeneration, right eye, intermediate dry stage: Secondary | ICD-10-CM | POA: Diagnosis not present

## 2019-03-31 ENCOUNTER — Other Ambulatory Visit: Payer: Self-pay | Admitting: Endocrinology

## 2019-03-31 ENCOUNTER — Other Ambulatory Visit: Payer: Self-pay | Admitting: Cardiovascular Disease

## 2019-03-31 NOTE — Telephone Encounter (Signed)
Rx request sent to pharmacy.  

## 2019-04-06 ENCOUNTER — Other Ambulatory Visit: Payer: Self-pay | Admitting: Cardiovascular Disease

## 2019-04-13 ENCOUNTER — Telehealth: Payer: Self-pay | Admitting: Endocrinology

## 2019-04-13 NOTE — Telephone Encounter (Signed)
[facility] called her to let her know she is due for another bone density  Referral needs to be in so she can schedule this appt

## 2019-04-17 NOTE — Telephone Encounter (Signed)
Bone density is done every 2 years and will be due in 10/2019

## 2019-04-27 DIAGNOSIS — H353132 Nonexudative age-related macular degeneration, bilateral, intermediate dry stage: Secondary | ICD-10-CM | POA: Diagnosis not present

## 2019-04-27 DIAGNOSIS — H04123 Dry eye syndrome of bilateral lacrimal glands: Secondary | ICD-10-CM | POA: Diagnosis not present

## 2019-04-27 DIAGNOSIS — H35363 Drusen (degenerative) of macula, bilateral: Secondary | ICD-10-CM | POA: Diagnosis not present

## 2019-04-27 DIAGNOSIS — H25013 Cortical age-related cataract, bilateral: Secondary | ICD-10-CM | POA: Diagnosis not present

## 2019-04-29 DIAGNOSIS — H353112 Nonexudative age-related macular degeneration, right eye, intermediate dry stage: Secondary | ICD-10-CM | POA: Diagnosis not present

## 2019-05-29 DIAGNOSIS — H353112 Nonexudative age-related macular degeneration, right eye, intermediate dry stage: Secondary | ICD-10-CM | POA: Diagnosis not present

## 2019-06-06 ENCOUNTER — Ambulatory Visit: Payer: Medicare Other

## 2019-06-07 ENCOUNTER — Ambulatory Visit: Payer: Medicare Other | Attending: Internal Medicine

## 2019-06-07 ENCOUNTER — Other Ambulatory Visit: Payer: Self-pay

## 2019-06-07 DIAGNOSIS — Z23 Encounter for immunization: Secondary | ICD-10-CM | POA: Diagnosis not present

## 2019-06-07 NOTE — Progress Notes (Signed)
   Covid-19 Vaccination Clinic  Name:  BRIAUNA DARGENIO    MRN: [id number] DOB: June 11, 1940  06/07/2019  Ms. Noone was observed post Covid-19 immunization for 15 minutes without incidence. She was provided with Vaccine Information Sheet and instruction to access the V-Safe system.   Ms. Raynolds was instructed to call 911 with any severe reactions post vaccine: Marland Kitchen Difficulty breathing  . Swelling of your face and throat  . A fast heartbeat  . A bad rash all over your body  . Dizziness and weakness    Immunizations Administered    Name Date Dose VIS Date Route   Pfizer COVID-19 Vaccine 06/07/2019 10:50 AM 0.3 mL 04/29/2019 Intramuscular   Manufacturer: [employer], [employer]   Lot: [id number]   [place]: [id number]

## 2019-06-09 DIAGNOSIS — I10 Essential (primary) hypertension: Secondary | ICD-10-CM | POA: Diagnosis not present

## 2019-06-09 DIAGNOSIS — I2510 Atherosclerotic heart disease of native coronary artery without angina pectoris: Secondary | ICD-10-CM | POA: Diagnosis not present

## 2019-06-09 DIAGNOSIS — E782 Mixed hyperlipidemia: Secondary | ICD-10-CM | POA: Diagnosis not present

## 2019-06-09 DIAGNOSIS — E1169 Type 2 diabetes mellitus with other specified complication: Secondary | ICD-10-CM | POA: Diagnosis not present

## 2019-06-09 DIAGNOSIS — M19071 Primary osteoarthritis, right ankle and foot: Secondary | ICD-10-CM | POA: Diagnosis not present

## 2019-06-28 ENCOUNTER — Ambulatory Visit: Payer: Medicare Other | Attending: Internal Medicine

## 2019-06-28 DIAGNOSIS — H353112 Nonexudative age-related macular degeneration, right eye, intermediate dry stage: Secondary | ICD-10-CM | POA: Diagnosis not present

## 2019-06-28 DIAGNOSIS — Z23 Encounter for immunization: Secondary | ICD-10-CM | POA: Insufficient documentation

## 2019-06-28 NOTE — Progress Notes (Signed)
   Covid-19 Vaccination Clinic  Name:  Brittany Figueroa    MRN: [id number] DOB: 1940/07/27  06/28/2019  [name] was observed post Covid-19 immunization for 15 minutes without incidence. She was provided with Vaccine Information Sheet and instruction to access the V-Safe system.   [name] was instructed to call 911 with any severe reactions post vaccine: Marland Kitchen Difficulty breathing  . Swelling of your face and throat  . A fast heartbeat  . A bad rash all over your body  . Dizziness and weakness    Immunizations Administered    Name Date Dose VIS Date Route   Pfizer COVID-19 Vaccine 06/28/2019 10:25 AM 0.3 mL 04/29/2019 Intramuscular   Manufacturer: [place]   Lot: [id number]   [place]: [id number]

## 2019-07-01 DIAGNOSIS — R208 Other disturbances of skin sensation: Secondary | ICD-10-CM | POA: Diagnosis not present

## 2019-07-01 DIAGNOSIS — L814 Other melanin hyperpigmentation: Secondary | ICD-10-CM | POA: Diagnosis not present

## 2019-07-01 DIAGNOSIS — L820 Inflamed seborrheic keratosis: Secondary | ICD-10-CM | POA: Diagnosis not present

## 2019-07-01 DIAGNOSIS — L821 Other seborrheic keratosis: Secondary | ICD-10-CM | POA: Diagnosis not present

## 2019-07-01 DIAGNOSIS — D225 Melanocytic nevi of trunk: Secondary | ICD-10-CM | POA: Diagnosis not present

## 2019-07-05 ENCOUNTER — Other Ambulatory Visit: Payer: Self-pay | Admitting: Cardiovascular Disease

## 2019-07-05 ENCOUNTER — Other Ambulatory Visit: Payer: Self-pay

## 2019-07-05 DIAGNOSIS — H353132 Nonexudative age-related macular degeneration, bilateral, intermediate dry stage: Secondary | ICD-10-CM | POA: Diagnosis not present

## 2019-07-05 DIAGNOSIS — H04123 Dry eye syndrome of bilateral lacrimal glands: Secondary | ICD-10-CM | POA: Diagnosis not present

## 2019-07-11 DIAGNOSIS — I10 Essential (primary) hypertension: Secondary | ICD-10-CM | POA: Diagnosis not present

## 2019-07-11 DIAGNOSIS — E782 Mixed hyperlipidemia: Secondary | ICD-10-CM | POA: Diagnosis not present

## 2019-07-11 DIAGNOSIS — I2510 Atherosclerotic heart disease of native coronary artery without angina pectoris: Secondary | ICD-10-CM | POA: Diagnosis not present

## 2019-07-11 DIAGNOSIS — Z7984 Long term (current) use of oral hypoglycemic drugs: Secondary | ICD-10-CM | POA: Diagnosis not present

## 2019-07-11 DIAGNOSIS — E1169 Type 2 diabetes mellitus with other specified complication: Secondary | ICD-10-CM | POA: Diagnosis not present

## 2019-07-12 ENCOUNTER — Telehealth: Payer: Self-pay | Admitting: Cardiovascular Disease

## 2019-07-12 NOTE — Telephone Encounter (Signed)
Called and instructed the pt to call the [employer] and apply by phone, pt voiced gratitude and understanding

## 2019-07-12 NOTE — Telephone Encounter (Signed)
The patient called and wanted to speak with Raquel about renewing her grant for [employer]. The patient says her grant is due to expire next month, and she would like to start the process to get it renewed

## 2019-07-15 ENCOUNTER — Other Ambulatory Visit: Payer: Self-pay

## 2019-07-15 ENCOUNTER — Other Ambulatory Visit (INDEPENDENT_AMBULATORY_CARE_PROVIDER_SITE_OTHER): Payer: Medicare Other

## 2019-07-15 DIAGNOSIS — R7303 Prediabetes: Secondary | ICD-10-CM | POA: Diagnosis not present

## 2019-07-15 DIAGNOSIS — E039 Hypothyroidism, unspecified: Secondary | ICD-10-CM | POA: Diagnosis not present

## 2019-07-15 LAB — BASIC METABOLIC PANEL
BUN: 18 mg/dL (ref 6–23)
CO2: 27 mEq/L (ref 19–32)
Calcium: 10.2 mg/dL (ref 8.4–10.5)
Chloride: 99 mEq/L (ref 96–112)
Creatinine, Ser: 0.74 mg/dL (ref 0.40–1.20)
GFR: 76.21 mL/min (ref >60.00)
Glucose, Bld: 125 mg/dL — ABNORMAL HIGH (ref 70–99)
Potassium: 4.1 mEq/L (ref 3.5–5.1)
Sodium: 133 mEq/L — ABNORMAL LOW (ref 135–145)

## 2019-07-15 LAB — T4, FREE: Free T4: 1.14 ng/dL (ref 0.60–1.60)

## 2019-07-15 LAB — TSH: TSH: 4 u[IU]/mL (ref 0.35–4.50)

## 2019-07-15 LAB — HEMOGLOBIN A1C: Hgb A1c MFr Bld: 5.9 % (ref 4.6–6.5)

## 2019-07-19 ENCOUNTER — Other Ambulatory Visit: Payer: Medicare Other

## 2019-07-22 ENCOUNTER — Ambulatory Visit (INDEPENDENT_AMBULATORY_CARE_PROVIDER_SITE_OTHER): Payer: Medicare Other | Admitting: Endocrinology

## 2019-07-22 ENCOUNTER — Encounter: Payer: Self-pay | Admitting: Endocrinology

## 2019-07-22 ENCOUNTER — Other Ambulatory Visit: Payer: Self-pay

## 2019-07-22 VITALS — BP 108/60 | HR 66 | Ht 61.5 in | Wt 137.2 lb

## 2019-07-22 DIAGNOSIS — E871 Hypo-osmolality and hyponatremia: Secondary | ICD-10-CM | POA: Diagnosis not present

## 2019-07-22 DIAGNOSIS — R7303 Prediabetes: Secondary | ICD-10-CM | POA: Diagnosis not present

## 2019-07-22 DIAGNOSIS — E039 Hypothyroidism, unspecified: Secondary | ICD-10-CM | POA: Diagnosis not present

## 2019-07-22 DIAGNOSIS — M8580 Other specified disorders of bone density and structure, unspecified site: Secondary | ICD-10-CM

## 2019-07-22 DIAGNOSIS — E559 Vitamin D deficiency, unspecified: Secondary | ICD-10-CM | POA: Diagnosis not present

## 2019-07-22 DIAGNOSIS — Z780 Asymptomatic menopausal state: Secondary | ICD-10-CM

## 2019-07-22 NOTE — Progress Notes (Signed)
Patient ID: Brittany Figueroa, female   DOB: 01-21-1941, 79 y.o.   MRN: [id number]           Chief complaint: Endocrinology follow-up    History of Present Illness:  PREVIOUS history: She may have been told about 10 years ago that she had prediabetes and reportedly had a normal glucose tolerance test. She was told by her PCP that she has type 2 diabetes based on an A1c of 6.5% in 07/2013 She has not had any other A1c is subsequently above normal, normal range and previous lab 6.2 or less She also has not had any abnormal blood sugars except a glucose of 125 in 3/16  Has history of increased fasting blood sugars ranging from 108-125 since about 2012  GLUCOSE TOLERANCE test on 05/23/15: Fasting glucose 110, two-hour glucose 192  RECENT history:   She has impaired fasting glucose and glucose intolerance  She has been intermittently on metformin ER since about 08/2015 In 1/18 when A1c was 6.5 and glucose 133 fasting she was told to start back on metformin and also improve her diet She was taking 1500 mg a day but could not tolerate this because of feeling of bloating  Current regimen: Metformin ER 500 mg 2 tablets a day  A1c is now 5.1 compared to 6%,   Current management and blood sugars:    She is checking her blood sugars at home fairly regularly and mostly in the mornings, however because of her meter having the wrong time programmed difficult to analyze her readings  Fasting blood sugar 125 in the lab  However home readings are not over 116 in the last month  She is taking Metformin ER at dinnertime, both tablets together with food, no abdominal discomfort with this  As before has difficulty exercising because of various musculoskeletal problems  She may have gained some weight since her last visit  Again she is concerned about having a label of type 2 diabetes, A1c has been consistently in prediabetic range  Home readings using the FreeStyle   Blood sugar range 88-116,  most readings in the mornings, detailed analysis not possible because of wrong time programmed  Weight history: Previous range 139-143  Wt Readings from Last 3 Encounters:  07/22/19 137 lb 3.2 oz (62.2 kg)  11/11/18 133 lb (60.3 kg)  10/06/18 134 lb 8 oz (61 kg)    Lab Results  Component Value Date   HGBA1C 5.9 07/15/2019   HGBA1C 6.0 03/14/2019   HGBA1C 6.1 11/08/2018   Lab Results  Component Value Date   LDLCALC 74 03/14/2019   CREATININE 0.74 07/15/2019   HYPOTHYROIDISM and other problems reviewed today: See review of systems    No visits with results within 1 Week(s) from this visit.  Latest known visit with results is:  Lab on 07/15/2019  Component Date Value Ref Range Status  . Free T4 07/15/2019 1.14  0.60 - 1.60 ng/dL Final   Comment: Specimens from patients who are undergoing biotin therapy and /or ingesting biotin supplements may contain high levels of biotin.  The higher biotin concentration in these specimens interferes with this Free T4 assay.  Specimens that contain high levels  of biotin may cause false high results for this Free T4 assay.  Please interpret results in light of the total clinical presentation of the patient.    Marland Kitchen TSH 07/15/2019 4.00  0.35 - 4.50 uIU/mL Final  . Sodium 07/15/2019 133* 135 - 145 mEq/L Final  . Potassium 07/15/2019  4.1  3.5 - 5.1 mEq/L Final  . Chloride 07/15/2019 99  96 - 112 mEq/L Final  . CO2 07/15/2019 27  19 - 32 mEq/L Final  . Glucose, Bld 07/15/2019 125* 70 - 99 mg/dL Final  . BUN 07/15/2019 18  6 - 23 mg/dL Final  . Creatinine, Ser 07/15/2019 0.74  0.40 - 1.20 mg/dL Final  . GFR 07/15/2019 76.21  >60.00 mL/min Final  . Calcium 07/15/2019 10.2  8.4 - 10.5 mg/dL Final  . Hgb A1c MFr Bld 07/15/2019 5.9  4.6 - 6.5 % Final   Glycemic Control Guidelines for People with Diabetes:Non Diabetic:  <6%Goal of Therapy: <7%Additional Action Suggested:  >8%       Past Medical History:  Diagnosis Date  . Abnormal finding on EKG,  new anterolateral T-wave inversions  05/14/2012  . Abnormal nuclear stress test, with infero-lateral ischemia 05/14/2012  . Arthritis    "right thumb; all my fingers" (05/13/2012), cervical spondylosis   . Atypical angina ([place]) 05/14/2012  . CAD (coronary artery disease), 05/13/12, with 80% LAD 05/14/2012  . Cancer ([place])    basal cell on facial in L temporal region    . CMC arthritis, thumb, degenerative   . Complication of anesthesia 12/2009   "had endoscopy; larynx went into spasms; stopped breathing for 15 seconds" (05/13/2012)  . GERD (gastroesophageal reflux disease) 2011  . Hypercholesteremia   . Hypertension   . S/P angioplasty with stent, LAD 05/13/12 05/14/2012  . Tuberculosis    test +- [place] [facility] - Dr. [name]- CXR- OK    Past Surgical History:  Procedure Laterality Date  . ANTERIOR CERVICAL DECOMP/DISCECTOMY FUSION N/A 12/13/2013   Procedure: ANTERIOR CERVICAL DECOMPRESSION/DISCECTOMY FUSION 3 LEVELS Cervical four/five,five/six,six/seven. Anterior cervical disectomy and fusion with peek and plate ;  Surgeon: [name], MD;  Location: [facility];  Service: Neurosurgery;  Laterality: N/A;  . CARDIAC CATHETERIZATION N/A 07/02/2015   Procedure: Left Heart Cath and Coronary Angiography;  Surgeon: [name], MD;  Location: [facility];  Service: Cardiovascular;  Laterality: N/A;  . CORONARY ANGIOPLASTY WITH STENT PLACEMENT  05/13/2012   DES to LAD; she has total RCA with left to rt coll. normal LV function done for positive nuc study  . DILATION AND CURETTAGE OF UTERUS  1960's; 1970's; 1980   "probably 3" (05/13/2012)  . FOREARM / WRIST TENDON LESION EXCISION  ?11/2001   "right; did waver to try to get ulnar out of hole that it had cut" (05/13/2012  . INGUINAL HERNIA REPAIR  ~ 1966; 07/21/2002   "right; left" (05/13/2012)  . LEFT HEART CATHETERIZATION WITH CORONARY ANGIOGRAM N/A 05/13/2012   Procedure: LEFT HEART CATHETERIZATION WITH CORONARY ANGIOGRAM;  Surgeon:  [name], MD;  Location: [facility]ATH [facility];  Service: Cardiovascular;  Laterality: N/A;  . OSTEOTOMY AND ULNAR SHORTENING  07/21/2002   "right" (05/13/2012)  . TONSILLECTOMY AND ADENOIDECTOMY  1950's  . TUBAL LIGATION  1980    Family History  Problem Relation Age of Onset  . Hypertension Mother   . Diabetes Mother   . Hyperlipidemia Father   . Heart disease Father   . Stroke Father   . Heart disease Sister   . Diabetes Sister   . Heart disease Brother   . Diabetes Maternal Aunt     Social History:  reports that she quit smoking about 31 years ago. Her smoking use included cigarettes. She has a 10.00 pack-year smoking history. She has never used smokeless tobacco. She  reports current alcohol use of about 12.0 standard drinks of alcohol per week. She reports that she does not use drugs.  Allergies:  Allergies  Allergen Reactions  . Statins Other (See Comments)    "intermittent loss of circulation; hands and arms will go to sleep; feet will get cramps; fatigue" (05/13/2012).  This occurred with Lipitor, Zocor, Crestor 5 mg qd, and she thinks pravastatin as well  . Amlodipine Itching  . Gadolinium Derivatives Itching and Nausea Only    Pt had severe nausea and itching on legs, neck and back, per dr [name] pt needs 13 hour prep before contrast in the future    Allergies as of 07/22/2019      Reactions   Statins Other (See Comments)   "intermittent loss of circulation; hands and arms will go to sleep; feet will get cramps; fatigue" (05/13/2012).  This occurred with Lipitor, Zocor, Crestor 5 mg qd, and she thinks pravastatin as well   Amlodipine Itching   Gadolinium Derivatives Itching, Nausea Only   Pt had severe nausea and itching on legs, neck and back, per dr [name] pt needs 13 hour prep before contrast in the future      Medication List       Accurate as of July 22, 2019 10:40 AM. If you have any questions, ask your nurse or doctor.        acetaminophen 500 MG  tablet Commonly known as: TYLENOL Take 500 mg by mouth every 6 (six) hours as needed for mild pain or headache.   aspirin 81 MG tablet Take 81 mg by mouth daily.   calcium-vitamin D 500-200 MG-UNIT tablet Commonly known as: OSCAL WITH D Take 2 tablets by mouth daily.   carvedilol 6.25 MG tablet Commonly known as: COREG TAKE 1 AND 1/2 TABLETS(9.375 MG) BY MOUTH TWICE DAILY   chlorthalidone 25 MG tablet Commonly known as: HYGROTON Take 0.5 tablets (12.5 mg total) by mouth daily.   clopidogrel 75 MG tablet Commonly known as: PLAVIX TAKE 1 TABLET(75 MG) BY MOUTH DAILY   diclofenac sodium 1 % Gel Commonly known as: VOLTAREN Apply 2 g topically daily as needed. For pain   Evolocumab 140 MG/ML Soaj Commonly known as: Repatha SureClick Inject [id number] mg into the skin every 14 (fourteen) days.   ezetimibe 10 MG tablet Commonly known as: ZETIA TAKE 1 TABLET(10 MG) BY MOUTH DAILY   fenofibrate 160 MG tablet TAKE 1 TABLET(160 MG) BY MOUTH DAILY   glucose blood test strip Commonly known as: OneTouch Verio Use to test blood sugar once daily Dx code E11.9   hydrocortisone valerate cream 0.2 % Commonly known as: WESTCORT Apply 1 application topically daily as needed (ITCHING).   irbesartan 300 MG tablet Commonly known as: AVAPRO TAKE 1 TABLET(300 MG) BY MOUTH DAILY   levothyroxine 25 MCG tablet Commonly known as: SYNTHROID TAKE 1 TABLET(25 MCG) BY MOUTH DAILY BEFORE BREAKFAST   metFORMIN 500 MG 24 hr tablet Commonly known as: GLUCOPHAGE-XR TAKE 2 TABLETS(1000 MG) BY MOUTH DAILY WITH SUPPER   multivitamin with minerals Tabs tablet Take 1 tablet by mouth daily.   nitroGLYCERIN 0.4 MG SL tablet Commonly known as: NITROSTAT PLACE 1 TABLET UNDER THE TONGUE EVERY 5 MINUTES AS NEEDED FOR CHEST PAIN   Vitamin D3 50 MCG (2000 UT) Tabs Take 2,000 Units by mouth 2 (two) times daily.       LABS:  No visits with results within 1 Week(s) from this visit.  Latest known visit  with results is:  Lab on 07/15/2019  Component Date Value Ref Range Status  . Free T4 07/15/2019 1.14  0.60 - 1.60 ng/dL Final   Comment: Specimens from patients who are undergoing biotin therapy and /or ingesting biotin supplements may contain high levels of biotin.  The higher biotin concentration in these specimens interferes with this Free T4 assay.  Specimens that contain high levels  of biotin may cause false high results for this Free T4 assay.  Please interpret results in light of the total clinical presentation of the patient.    Marland Kitchen TSH 07/15/2019 4.00  0.35 - 4.50 uIU/mL Final  . Sodium 07/15/2019 133* 135 - 145 mEq/L Final  . Potassium 07/15/2019 4.1  3.5 - 5.1 mEq/L Final  . Chloride 07/15/2019 99  96 - 112 mEq/L Final  . CO2 07/15/2019 27  19 - 32 mEq/L Final  . Glucose, Bld 07/15/2019 125* 70 - 99 mg/dL Final  . BUN 07/15/2019 18  6 - 23 mg/dL Final  . Creatinine, Ser 07/15/2019 0.74  0.40 - 1.20 mg/dL Final  . GFR 07/15/2019 76.21  >60.00 mL/min Final  . Calcium 07/15/2019 10.2  8.4 - 10.5 mg/dL Final  . Hgb A1c MFr Bld 07/15/2019 5.9  4.6 - 6.5 % Final   Glycemic Control Guidelines for People with Diabetes:Non Diabetic:  <6%Goal of Therapy: <7%Additional Action Suggested:  >8%       Review of Systems    OSTEOPENIA: She had a follow-up bone density in June 2019 but has not had a discussion with her PCP about this Lowest T score is at the right neck femur with a value of -2.4 compared to 2.0, 2 years previously and she has not been on treatment She does however say that she had tried Fosamax in the past and this caused some GI side effects and does not want to try it again   She received RECLAST infusion on 12/15/17    Hypertension, treated with Avapro, chlorthalidone half tablet and Coreg, Followed by PCP. Home BP [id number] systolic   History of hyperlipidemia with some intolerance to statins, on Fenofibrate, 10 mg Lipitor and also on Zetia.  This is followed by  cardiologist in the lipid clinic  Fasting lipids as follows, has mild increase in triglycerides  Lab Results  Component Value Date   CHOL 154 03/14/2019   HDL 50 03/14/2019   LDLCALC 74 03/14/2019   LDLDIRECT 130.1 04/04/2014   TRIG 176 (H) 03/14/2019   CHOLHDL 3.1 03/14/2019      HYPOTHYROIDISM: TSH level previously has been as high as 5.6  This spontaneously improved but in 05/2018 was mildly increased again  At that time she was complaining of some fatigue, constipation, dry skin, some cold intolerance and difficulty losing weight.  She is taking levothyroxine 25 mcg daily since she appeared to be symptomatic TSH is still fluctuating but she does not complain of recent fatigue  Her TSH is consistently normal  Lab Results  Component Value Date   TSH 4.00 07/15/2019   TSH 2.61 03/14/2019   TSH 2.28 11/08/2018   FREET4 1.14 07/15/2019   FREET4 1.18 03/14/2019   FREET4 1.28 11/08/2018      PHYSICAL EXAM:  BP 108/60 (BP Location: Left Arm, Patient Position: Sitting, Cuff Size: Normal)   Pulse 66   Ht 5' 1.5" (1.562 m)   Wt 137 lb 3.2 oz (62.2 kg)   SpO2 97%   BMI 25.50 kg/m      ASSESSMENT:   Prediabetes with  impaired fasting glucose and also impaired glucose tolerance  Her A1c is very gradually improving down to 5.9  She is on 1000 mg of Metformin ER  She has fairly good blood sugars at home although lab glucose is 125 fasting Has not had any worsening of her prediabetes and Metformin is likely helping She usually trying to be consistent with her diet Activity level is limited   HYPOTHYROIDISM: TSH is normal on treatment She may have been mildly symptomatic before starting 25 mcg levothyroxine  OSTEOPENIA: Her last T score was -2.4 and progressing She had Reclast in 2019 Bone density pending for this year She will get Reclast every 2 years and the next injection will be in 11/2019  Lipids: LDL controlled, has mild increase in triglycerides, lipids  being managed at the lipid clinic through cardiology, may benefit from higher dose of statin with her history of CAD  All other questions including regarding Covid vaccine were answered today  Mild hyponatremia related to thiazide diuretic, asymptomatic  Low normal blood pressure: She can discuss stopping chlorthalidone with her PCP on upcoming visit  PLAN:   Stay on Metformin unchanged She can check blood sugars 3 times a week with some readings fasting and some about 2 hours after meals Encourage her to be as active as possible  Continue levothyroxine 25 mcg as before  Stay on vitamin D supplements  Will order bone density on her next visit  Follow-up in  4 months     Lathyn Griggs 07/22/2019, 10:40 AM

## 2019-07-22 NOTE — Patient Instructions (Signed)
Talk to Dr Chauncey Cruel about Blood pressure

## 2019-07-28 DIAGNOSIS — H353112 Nonexudative age-related macular degeneration, right eye, intermediate dry stage: Secondary | ICD-10-CM | POA: Diagnosis not present

## 2019-07-29 DIAGNOSIS — L820 Inflamed seborrheic keratosis: Secondary | ICD-10-CM | POA: Diagnosis not present

## 2019-07-29 DIAGNOSIS — R208 Other disturbances of skin sensation: Secondary | ICD-10-CM | POA: Diagnosis not present

## 2019-08-02 ENCOUNTER — Other Ambulatory Visit: Payer: Self-pay

## 2019-08-02 MED ORDER — IRBESARTAN 300 MG PO TABS: ORAL_TABLET | ORAL | 2 refills | Status: DC

## 2019-08-10 DIAGNOSIS — E782 Mixed hyperlipidemia: Secondary | ICD-10-CM | POA: Diagnosis not present

## 2019-08-10 DIAGNOSIS — Z7984 Long term (current) use of oral hypoglycemic drugs: Secondary | ICD-10-CM | POA: Diagnosis not present

## 2019-08-10 DIAGNOSIS — E1169 Type 2 diabetes mellitus with other specified complication: Secondary | ICD-10-CM | POA: Diagnosis not present

## 2019-08-10 DIAGNOSIS — I2510 Atherosclerotic heart disease of native coronary artery without angina pectoris: Secondary | ICD-10-CM | POA: Diagnosis not present

## 2019-08-10 DIAGNOSIS — I10 Essential (primary) hypertension: Secondary | ICD-10-CM | POA: Diagnosis not present

## 2019-08-12 ENCOUNTER — Other Ambulatory Visit: Payer: Self-pay

## 2019-08-12 MED ORDER — LEVOTHYROXINE SODIUM 25 MCG PO TABS: ORAL_TABLET | ORAL | 1 refills | Status: DC

## 2019-08-26 ENCOUNTER — Other Ambulatory Visit: Payer: Self-pay | Admitting: Cardiovascular Disease

## 2019-08-27 DIAGNOSIS — H353112 Nonexudative age-related macular degeneration, right eye, intermediate dry stage: Secondary | ICD-10-CM | POA: Diagnosis not present

## 2019-09-01 ENCOUNTER — Telehealth: Payer: Self-pay | Admitting: Cardiovascular Disease

## 2019-09-01 ENCOUNTER — Telehealth: Payer: Self-pay

## 2019-09-01 ENCOUNTER — Other Ambulatory Visit: Payer: Self-pay

## 2019-09-01 DIAGNOSIS — R7303 Prediabetes: Secondary | ICD-10-CM | POA: Diagnosis not present

## 2019-09-01 DIAGNOSIS — I10 Essential (primary) hypertension: Secondary | ICD-10-CM | POA: Diagnosis not present

## 2019-09-01 DIAGNOSIS — Z1389 Encounter for screening for other disorder: Secondary | ICD-10-CM | POA: Diagnosis not present

## 2019-09-01 DIAGNOSIS — Z0000 Encounter for general adult medical examination without abnormal findings: Secondary | ICD-10-CM | POA: Diagnosis not present

## 2019-09-01 MED ORDER — METFORMIN HCL ER 500 MG PO TB24: ORAL_TABLET | ORAL | 1 refills | Status: DC

## 2019-09-01 NOTE — Telephone Encounter (Signed)
Put patient through to coumadin clinic VM, she did not want me to send up a message.

## 2019-09-01 NOTE — Telephone Encounter (Signed)
Returned call to pt who stated they left repatha in the car and it was wasted so they are going to be issued another one from [facility]. I attempted to call the pt back to let them know that we received the msg but their voicemail was full.

## 2019-09-06 DIAGNOSIS — E782 Mixed hyperlipidemia: Secondary | ICD-10-CM | POA: Diagnosis not present

## 2019-09-06 DIAGNOSIS — I10 Essential (primary) hypertension: Secondary | ICD-10-CM | POA: Diagnosis not present

## 2019-09-06 DIAGNOSIS — I2510 Atherosclerotic heart disease of native coronary artery without angina pectoris: Secondary | ICD-10-CM | POA: Diagnosis not present

## 2019-09-21 ENCOUNTER — Other Ambulatory Visit: Payer: Self-pay | Admitting: Cardiovascular Disease

## 2019-09-21 DIAGNOSIS — I2510 Atherosclerotic heart disease of native coronary artery without angina pectoris: Secondary | ICD-10-CM | POA: Diagnosis not present

## 2019-09-21 DIAGNOSIS — E782 Mixed hyperlipidemia: Secondary | ICD-10-CM | POA: Diagnosis not present

## 2019-09-21 DIAGNOSIS — I10 Essential (primary) hypertension: Secondary | ICD-10-CM | POA: Diagnosis not present

## 2019-09-22 ENCOUNTER — Other Ambulatory Visit: Payer: Self-pay

## 2019-09-22 DIAGNOSIS — H353131 Nonexudative age-related macular degeneration, bilateral, early dry stage: Secondary | ICD-10-CM | POA: Diagnosis not present

## 2019-09-22 MED ORDER — CHLORTHALIDONE 25 MG PO TABS: 12.5000 mg | ORAL_TABLET | Freq: Every day | ORAL | 1 refills | Status: DC

## 2019-09-22 MED ORDER — CLOPIDOGREL BISULFATE 75 MG PO TABS: ORAL_TABLET | ORAL | 1 refills | Status: DC

## 2019-09-26 DIAGNOSIS — H353112 Nonexudative age-related macular degeneration, right eye, intermediate dry stage: Secondary | ICD-10-CM | POA: Diagnosis not present

## 2019-10-14 ENCOUNTER — Encounter: Payer: Self-pay | Admitting: Cardiovascular Disease

## 2019-10-14 ENCOUNTER — Ambulatory Visit (INDEPENDENT_AMBULATORY_CARE_PROVIDER_SITE_OTHER): Payer: Medicare Other | Admitting: Cardiovascular Disease

## 2019-10-14 ENCOUNTER — Other Ambulatory Visit: Payer: Self-pay

## 2019-10-14 VITALS — BP 124/70 | HR 71 | Ht 61.5 in | Wt 137.0 lb

## 2019-10-14 DIAGNOSIS — I10 Essential (primary) hypertension: Secondary | ICD-10-CM

## 2019-10-14 DIAGNOSIS — E785 Hyperlipidemia, unspecified: Secondary | ICD-10-CM

## 2019-10-14 LAB — LIPID PANEL
Chol/HDL Ratio: 2.8 ratio (ref 0.0–4.4)
Cholesterol, Total: 132 mg/dL (ref 100–199)
HDL: 48 mg/dL (ref >39)
LDL Chol Calc (NIH): 55 mg/dL (ref 0–99)
Triglycerides: 171 mg/dL — ABNORMAL HIGH (ref 0–149)
VLDL Cholesterol Cal: 29 mg/dL (ref 5–40)

## 2019-10-14 LAB — HEPATIC FUNCTION PANEL
ALT: 13 IU/L (ref 0–32)
AST: 19 IU/L (ref 0–40)
Albumin: 4.5 g/dL (ref 3.7–4.7)
Alkaline Phosphatase: 47 IU/L — ABNORMAL LOW (ref 48–121)
Bilirubin Total: 0.4 mg/dL (ref 0.0–1.2)
Bilirubin, Direct: 0.12 mg/dL (ref 0.00–0.40)
Total Protein: 7.2 g/dL (ref 6.0–8.5)

## 2019-10-14 NOTE — Patient Instructions (Addendum)
Medication Instructions:  STOP the Chlorthalidone   *If you need a refill on your cardiac medications before your next appointment, please call your pharmacy*   Lab Work: Your provider would like for you to have the following labs today: lipid and liver  If you have labs (blood work) drawn today and your tests are completely normal, you will receive your results only by: Marland Kitchen MyChart Message (if you have MyChart) OR . A paper copy in the mail If you have any lab test that is abnormal or we need to change your treatment, we will call you to review the results.   Testing/Procedures: None ordered   Follow-Up: At [facility], you and your health needs are our priority.  As part of our continuing mission to provide you with exceptional heart care, we have created designated Provider Care Teams.  These Care Teams include your primary Cardiologist (physician) and Advanced Practice Providers (APPs -  Physician Assistants and Nurse Practitioners) who all work together to provide you with the care you need, when you need it.  We recommend signing up for the patient portal called "MyChart".  Sign up information is provided on this After Visit Summary.  MyChart is used to connect with patients for Virtual Visits (Telemedicine).  Patients are able to view lab/test results, encounter notes, upcoming appointments, etc.  Non-urgent messages can be sent to your provider as well.   To learn more about what you can do with MyChart, go to [email or web address].    Your next appointment:   12 month(s)  The format for your next appointment:   In Person  Provider:   Quay Burow, MD

## 2019-10-14 NOTE — Assessment & Plan Note (Addendum)
History of hyperlipidemia on Repatha fenofibrate and Zetia with lipid profile performed 04/14/2019 revealing a total cholesterol of 154, LDL 74 and HDL 50.  We will recheck a lipid and liver profile today

## 2019-10-14 NOTE — Assessment & Plan Note (Signed)
History of CAD status post proximal LAD stenting by myself May 13, 2012.  She did have a total dominant RCA with left-to-right collaterals and normal LV function.  She has had Myoview stress test in the past that showed ischemia in the RCA territory explainable by her anatomy.  She denies chest pain or shortness of breath.

## 2019-10-14 NOTE — Addendum Note (Signed)
Addended by: Ricci Barker on: 10/14/2019 12:34 PM   Modules accepted: Orders

## 2019-10-14 NOTE — Assessment & Plan Note (Signed)
History of essential hypertension with blood pressure measured today 124/70.  She is on carvedilol and chlorthalidone.  I have asked her to stop her chlorthalidone.

## 2019-10-14 NOTE — Progress Notes (Signed)
10/14/2019 Brittany Figueroa   03/10/1941  [id number]  Primary Physician Lajean Manes, MD Primary Cardiologist: [name] MD FACP, [place], [place], [place]  HPI:  Brittany Figueroa is a 79 y.o.  mildly overweight divorced Caucasian female, mother to 2, grandmother to 3 grandchildren, who I last spoke to virtually on the phone 10/06/2018.  She has a history of hypertension and hyperlipidemia. I saw her in December of 2013 with new anterolateral T-wave inversion and a Myoview that showed inferolateral ischemia that was new compared to a prior study. She did have some unusual symptoms at that time. Based on this, I catheterized her on May 13, 2012, revealing an 80% proximal LAD lesion, which I stented using a drug-eluting stent, as well as a total dominant RCA with left to right collaterals and normal LV function. Since stenting her LAD, she is ultimately asymptomatic. Her other problems include hypertension and hyperlipidemia. I have reviewed her blood pressures, which were recorded during cardiac rehab, which were all normal. Her recent blood work revealed a total cholesterol of 161, LDL of 89 and HDL of 40.she denies chest pain or shortness of breath. She had a recent Myoview stress test performed several months ago that showed ischemia in the RCA territory but otherwise unremarkable is explainable by her known total dominant RCA with left to right collaterals. Chief complaint of excruciating neck pain thought to be related to cervical disc disease. She was evaluated by Dr. Because of her neck pain demonstrated cervical disc disease requiring fusion. Because of this she will need to undergo pharmacologic Myoview stress testing to risk stratify her. She underwent cervical discectomy by Dr. Deri Fuelling in 2016 with excellent clinical result. She no longer is in pain. Her major issue now is treatment of her hyperlipidemia. She is statin intolerant and complains of short-term memory loss.  Since I spoke to  her 1 year ago she continues to do well.  She did contract COVID-19 in July of last year but has recovered.  She is tolerating Repatha without side effects excellent lipid profile with LDL of 74 measured 03/14/2019.  She denies chest pain or shortness of breath.    Current Meds  Medication Sig  . acetaminophen (TYLENOL) 500 MG tablet Take 500 mg by mouth every 6 (six) hours as needed for mild pain or headache.  Marland Kitchen aspirin 81 MG tablet Take 81 mg by mouth daily.  . calcium-vitamin D (OSCAL WITH D) 500-200 MG-UNIT per tablet Take 2 tablets by mouth daily.  . carvedilol (COREG) 6.25 MG tablet Take 1 tablet (6.25 mg total) by mouth 2 (two) times daily with a meal.  . chlorthalidone (HYGROTON) 25 MG tablet Take 0.5 tablets (12.5 mg total) by mouth daily.  . Cholecalciferol (VITAMIN D3) 2000 UNITS TABS Take 2,000 Units by mouth 2 (two) times daily.  . clopidogrel (PLAVIX) 75 MG tablet TAKE 1 TABLET(75 MG) BY MOUTH DAILY  . diclofenac sodium (VOLTAREN) 1 % GEL Apply 2 g topically daily as needed. For pain  . Evolocumab (REPATHA SURECLICK) XX123456 MG/ML SOAJ Inject 1 pen into the skin every 14 (fourteen) days.  Marland Kitchen ezetimibe (ZETIA) 10 MG tablet TAKE 1 TABLET(10 MG) BY MOUTH DAILY (Patient taking differently: Take 5 mg by mouth daily. )  . fenofibrate 160 MG tablet TAKE 1 TABLET(160 MG) BY MOUTH DAILY  . glucose blood (ONETOUCH VERIO) test strip Use to test blood sugar once daily Dx code E11.9  . hydrocortisone valerate cream (WESTCORT) 0.2 %  Apply 1 application topically daily as needed (ITCHING).  Marland Kitchen irbesartan (AVAPRO) 300 MG tablet TAKE 1 TABLET(300 MG) BY MOUTH DAILY (Patient taking differently: Take 150 mg by mouth in the morning and at bedtime. TAKE 1 TABLET(300 MG) BY MOUTH DAILY)  . levothyroxine (SYNTHROID) 25 MCG tablet TAKE 1 TABLET(25 MCG) BY MOUTH DAILY BEFORE BREAKFAST  . metFORMIN (GLUCOPHAGE-XR) 500 MG 24 hr tablet Take 2 tablets by mouth daily with supper.  . Multiple Vitamin (MULTIVITAMIN  WITH MINERALS) TABS Take 1 tablet by mouth daily.  . nitroGLYCERIN (NITROSTAT) 0.4 MG SL tablet PLACE 1 TABLET UNDER THE TONGUE EVERY 5 MINUTES AS NEEDED FOR CHEST PAIN     Allergies  Allergen Reactions  . Statins Other (See Comments)    "intermittent loss of circulation; hands and arms will go to sleep; feet will get cramps; fatigue" (05/13/2012).  This occurred with Lipitor, Zocor, Crestor 5 mg qd, and she thinks pravastatin as well  . Amlodipine Itching  . Gadolinium Derivatives Itching and Nausea Only    Pt had severe nausea and itching on legs, neck and back, per dr [name] pt needs 13 hour prep before contrast in the future    Social History   Socioeconomic History  . Marital status: Divorced    Spouse name: Not on file  . Number of children: Not on file  . Years of education: Not on file  . Highest education level: Not on file  Occupational History  . Not on file  Tobacco Use  . Smoking status: Former Smoker    Packs/day: 0.50    Years: 20.00    Pack years: 10.00    Types: Cigarettes    Quit date: 07/05/1988    Years since quitting: 31.2  . Smokeless tobacco: Never Used  Substance and Sexual Activity  . Alcohol use: Yes    Alcohol/week: 12.0 standard drinks    Types: 12 Glasses of wine per week    Comment: 05/13/2012 "6-8 oz glass of red wine q hs"   . Drug use: No  . Sexual activity: Never  Other Topics Concern  . Not on file  Social History Narrative  . Not on file   Social Determinants of Health   Financial Resource Strain:   . Difficulty of Paying Living Expenses:   Food Insecurity:   . Worried About Charity fundraiser in the Last Year:   . Arboriculturist in the Last Year:   Transportation Needs:   . Film/video editor (Medical):   Marland Kitchen Lack of Transportation (Non-Medical):   Physical Activity:   . Days of Exercise per Week:   . Minutes of Exercise per Session:   Stress:   . Feeling of Stress :   Social Connections:   . Frequency of  Communication with Friends and Family:   . Frequency of Social Gatherings with Friends and Family:   . Attends Religious Services:   . Active Member of Clubs or Organizations:   . Attends Archivist Meetings:   Marland Kitchen Marital Status:   Intimate Partner Violence:   . Fear of Current or Ex-Partner:   . Emotionally Abused:   Marland Kitchen Physically Abused:   . Sexually Abused:      Review of Systems: General: negative for chills, fever, night sweats or weight changes.  Cardiovascular: negative for chest pain, dyspnea on exertion, edema, orthopnea, palpitations, paroxysmal nocturnal dyspnea or shortness of breath Dermatological: negative for rash Respiratory: negative for cough or wheezing Urologic: negative  for hematuria Abdominal: negative for nausea, vomiting, diarrhea, bright red blood per rectum, melena, or hematemesis Neurologic: negative for visual changes, syncope, or dizziness All other systems reviewed and are otherwise negative except as noted above.    Blood pressure 124/70, pulse 71, height 5' 1.5" (1.562 m), weight 137 lb (62.1 kg).  General appearance: alert and no distress Neck: no adenopathy, no carotid bruit, no JVD, supple, symmetrical, trachea midline and thyroid not enlarged, symmetric, no tenderness/mass/nodules Lungs: clear to auscultation bilaterally Heart: regular rate and rhythm, S1, S2 normal, no murmur, click, rub or gallop Extremities: extremities normal, atraumatic, no cyanosis or edema Pulses: 2+ and symmetric Skin: Skin color, texture, turgor normal. No rashes or lesions Neurologic: Alert and oriented X 3, normal strength and tone. Normal symmetric reflexes. Normal coordination and gait  EKG sinus rhythm at 71 with septal Q waves, left axis deviation.  I personally reviewed this EKG.  ASSESSMENT AND PLAN:   CAD- residual total RCA and Dx disease History of CAD status post proximal LAD stenting by myself May 13, 2012.  She did have a total dominant  RCA with left-to-right collaterals and normal LV function.  She has had Myoview stress test in the past that showed ischemia in the RCA territory explainable by her anatomy.  She denies chest pain or shortness of breath.  Dyslipidemia History of hyperlipidemia on Repatha fenofibrate and Zetia with lipid profile performed 04/14/2019 revealing a total cholesterol of 154, LDL 74 and HDL 50.  We will recheck a lipid and liver profile today  Essential hypertension History of essential hypertension with blood pressure measured today 124/70.  She is on carvedilol and chlorthalidone.  I have asked her to stop her chlorthalidone.      [name] MD FACP,FACC,FAHA, [facility] 10/14/2019 9:29 AM

## 2019-10-26 ENCOUNTER — Other Ambulatory Visit: Payer: Self-pay

## 2019-10-26 DIAGNOSIS — H353112 Nonexudative age-related macular degeneration, right eye, intermediate dry stage: Secondary | ICD-10-CM | POA: Diagnosis not present

## 2019-10-26 MED ORDER — IRBESARTAN 300 MG PO TABS: 150.0000 mg | ORAL_TABLET | Freq: Two times a day (BID) | ORAL | 3 refills | Status: DC

## 2019-11-14 ENCOUNTER — Encounter: Payer: Self-pay | Admitting: Endocrinology

## 2019-11-23 ENCOUNTER — Telehealth: Payer: Self-pay | Admitting: Cardiovascular Disease

## 2019-11-23 NOTE — Telephone Encounter (Signed)
Patient complained of leg cramps and runny nose.  She is wondering if it's caused by her cholesterol medications.  She currently takes repatha 140 mg, fenofibrate 160 mg and ezetimibe 5 mg (1/2 of 10 mg tab).    Advised her that we would not expect side effects like this to manifest after being on the medications for > 1 year, but she is insistent that the only time she ever gets leg cramps is when she takes cholesterol medications.   Suggested she stop fenofibrate x 2-3 weeks.  If cramping continues, she should re-start and skip ezetimibe for an equal length of time.    Patient voiced understanding.

## 2019-11-23 NOTE — Telephone Encounter (Signed)
Pt c/o medication issue:  1. Name of Medication:  Evolocumab (REPATHA SURECLICK) 694 MG/ML SOAJ   2. How are you currently taking this medication (dosage and times per day)? 1 pen every 14 days  3. Are you having a reaction (difficulty breathing--STAT)? no  4. What is your medication issue? Patient states she has been having a runny nose and cramps in her legs. She states the cramps started in her feet and now is also in her upper back legs. She states she is not sure if it is this medication causing it. She is due to take another injection tomorrow and would like to know whether to take it.

## 2019-11-29 ENCOUNTER — Other Ambulatory Visit: Payer: Self-pay

## 2019-11-29 ENCOUNTER — Other Ambulatory Visit (INDEPENDENT_AMBULATORY_CARE_PROVIDER_SITE_OTHER): Payer: Medicare Other

## 2019-11-29 DIAGNOSIS — M8580 Other specified disorders of bone density and structure, unspecified site: Secondary | ICD-10-CM | POA: Diagnosis not present

## 2019-11-29 DIAGNOSIS — E039 Hypothyroidism, unspecified: Secondary | ICD-10-CM

## 2019-11-29 DIAGNOSIS — E559 Vitamin D deficiency, unspecified: Secondary | ICD-10-CM

## 2019-11-29 DIAGNOSIS — Z780 Asymptomatic menopausal state: Secondary | ICD-10-CM

## 2019-11-29 DIAGNOSIS — R7303 Prediabetes: Secondary | ICD-10-CM

## 2019-11-29 DIAGNOSIS — E871 Hypo-osmolality and hyponatremia: Secondary | ICD-10-CM | POA: Diagnosis not present

## 2019-11-29 LAB — COMPREHENSIVE METABOLIC PANEL
ALT: 17 U/L (ref 0–35)
AST: 20 U/L (ref 0–37)
Albumin: 4.3 g/dL (ref 3.5–5.2)
Alkaline Phosphatase: 44 U/L (ref 39–117)
BUN: 14 mg/dL (ref 6–23)
CO2: 23 mEq/L (ref 19–32)
Calcium: 10 mg/dL (ref 8.4–10.5)
Chloride: 102 mEq/L (ref 96–112)
Creatinine, Ser: 0.66 mg/dL (ref 0.40–1.20)
GFR: 86.88 mL/min (ref >60.00)
Glucose, Bld: 117 mg/dL — ABNORMAL HIGH (ref 70–99)
Potassium: 4.1 mEq/L (ref 3.5–5.1)
Sodium: 133 mEq/L — ABNORMAL LOW (ref 135–145)
Total Bilirubin: 0.4 mg/dL (ref 0.2–1.2)
Total Protein: 7.3 g/dL (ref 6.0–8.3)

## 2019-11-29 LAB — T4, FREE: Free T4: 1.17 ng/dL (ref 0.60–1.60)

## 2019-11-29 LAB — VITAMIN D 25 HYDROXY (VIT D DEFICIENCY, FRACTURES): VITD: 49.2 ng/mL (ref 30.00–100.00)

## 2019-11-29 LAB — TSH: TSH: 4.23 u[IU]/mL (ref 0.35–4.50)

## 2019-11-29 LAB — HEMOGLOBIN A1C: Hgb A1c MFr Bld: 5.9 % (ref 4.6–6.5)

## 2019-12-02 ENCOUNTER — Encounter: Payer: Self-pay | Admitting: Endocrinology

## 2019-12-02 ENCOUNTER — Ambulatory Visit (INDEPENDENT_AMBULATORY_CARE_PROVIDER_SITE_OTHER): Payer: Medicare Other | Admitting: Endocrinology

## 2019-12-02 ENCOUNTER — Other Ambulatory Visit: Payer: Self-pay

## 2019-12-02 VITALS — BP 130/70 | HR 72 | Ht 61.5 in | Wt 137.4 lb

## 2019-12-02 DIAGNOSIS — R7303 Prediabetes: Secondary | ICD-10-CM | POA: Diagnosis not present

## 2019-12-02 DIAGNOSIS — E871 Hypo-osmolality and hyponatremia: Secondary | ICD-10-CM

## 2019-12-02 DIAGNOSIS — M85851 Other specified disorders of bone density and structure, right thigh: Secondary | ICD-10-CM | POA: Diagnosis not present

## 2019-12-02 NOTE — Progress Notes (Signed)
Patient ID: Brittany Figueroa, female   DOB: [birth year], 79 y.o.   MRN: [id number]           Chief complaint: Endocrinology follow-up    History of Present Illness:  PREVIOUS history: She may have been told about 10 years ago that she had prediabetes and reportedly had a normal glucose tolerance test. She was told by her PCP that she has type 2 diabetes based on an A1c of 6.5% in 07/2013 She has not had any other A1c is subsequently above normal, normal range and previous lab 6.2 or less She also has not had any abnormal blood sugars except a glucose of 125 in 3/16  Has history of increased fasting blood sugars ranging from 108-125 since about 2012  GLUCOSE TOLERANCE test on 05/23/15: Fasting glucose 110, two-hour glucose 192  RECENT history:   She has impaired fasting glucose and glucose intolerance  She has been intermittently on metformin ER since about 08/2015 In 1/18 when A1c was 6.5 and glucose 133 fasting she was told to start back on metformin and also improve her diet She was taking 1500 mg a day but could not tolerate this because of feeling of bloating  Current regimen: Metformin ER 500 mg 2 tablets a day  A1c is 5.9, unchanged  Current management and blood sugars:    She is checking her blood sugars very infrequently and results are as below  Again the time of her meter is not correct  She tends not to check readings after meals  Lab fasting glucose was 117 but previously had been 125  She is still trying to modify her carbohydrate intake and reduce portions but not able to lose any more weight  Home readings using the FreeStyle recent range 94-99, highest 110   Weight history: Previous range 139-143  Wt Readings from Last 3 Encounters:  12/02/19 137 lb 6.4 oz (62.3 kg)  10/14/19 137 lb (62.1 kg)  07/22/19 137 lb 3.2 oz (62.2 kg)    Lab Results  Component Value Date   HGBA1C 5.9 11/29/2019   HGBA1C 5.9 07/15/2019   HGBA1C 6.0 03/14/2019   Lab Results    Component Value Date   LDLCALC 55 10/14/2019   CREATININE 0.66 11/29/2019   HYPOTHYROIDISM and other problems reviewed today: See review of systems    Lab on 11/29/2019  Component Date Value Ref Range Status  . VITD 11/29/2019 49.20  30.00 - 100.00 ng/mL Final  . Free T4 11/29/2019 1.17  0.60 - 1.60 ng/dL Final   Comment: Specimens from patients who are undergoing biotin therapy and /or ingesting biotin supplements may contain high levels of biotin.  The higher biotin concentration in these specimens interferes with this Free T4 assay.  Specimens that contain high levels  of biotin may cause false high results for this Free T4 assay.  Please interpret results in light of the total clinical presentation of the patient.    Marland Kitchen TSH 11/29/2019 4.23  0.35 - 4.50 uIU/mL Final  . Sodium 11/29/2019 133* 135 - 145 mEq/L Final  . Potassium 11/29/2019 4.1  3.5 - 5.1 mEq/L Final  . Chloride 11/29/2019 102  96 - 112 mEq/L Final  . CO2 11/29/2019 23  19 - 32 mEq/L Final  . Glucose, Bld 11/29/2019 117* 70 - 99 mg/dL Final  . BUN 11/29/2019 14  6 - 23 mg/dL Final  . Creatinine, Ser 11/29/2019 0.66  0.40 - 1.20 mg/dL Final  . Total Bilirubin 11/29/2019 0.4  0.2 - 1.2 mg/dL Final  . Alkaline Phosphatase 11/29/2019 44  39 - 117 U/L Final  . AST 11/29/2019 20  0 - 37 U/L Final  . ALT 11/29/2019 17  0 - 35 U/L Final  . Total Protein 11/29/2019 7.3  6.0 - 8.3 g/dL Final  . Albumin 11/29/2019 4.3  3.5 - 5.2 g/dL Final  . GFR 11/29/2019 86.88  >60.00 mL/min Final  . Calcium 11/29/2019 10.0  8.4 - 10.5 mg/dL Final  . Hgb A1c MFr Bld 11/29/2019 5.9  4.6 - 6.5 % Final   Glycemic Control Guidelines for People with Diabetes:Non Diabetic:  <6%Goal of Therapy: <7%Additional Action Suggested:  >8%       Past Medical History:  Diagnosis Date  . Abnormal finding on EKG, new anterolateral T-wave inversions  05/14/2012  . Abnormal nuclear stress test, with infero-lateral ischemia 05/14/2012  . Arthritis     "right thumb; all my fingers" (05/13/2012), cervical spondylosis   . Atypical angina ([place]) 05/14/2012  . CAD (coronary artery disease), 05/13/12, with 80% LAD 05/14/2012  . Cancer ([place])    basal cell on facial in L temporal region    . CMC arthritis, thumb, degenerative   . Complication of anesthesia 12/2009   "had endoscopy; larynx went into spasms; stopped breathing for 15 seconds" (05/13/2012)  . GERD (gastroesophageal reflux disease) 2011  . Hypercholesteremia   . Hypertension   . S/P angioplasty with stent, LAD 05/13/12 05/14/2012  . Tuberculosis    test +- [place] [facility] - Dr. [name]- CXR- OK    Past Surgical History:  Procedure Laterality Date  . ANTERIOR CERVICAL DECOMP/DISCECTOMY FUSION N/A 12/13/2013   Procedure: ANTERIOR CERVICAL DECOMPRESSION/DISCECTOMY FUSION 3 LEVELS Cervical four/five,five/six,six/seven. Anterior cervical disectomy and fusion with peek and plate ;  Surgeon: [name], MD;  Location: [facility];  Service: Neurosurgery;  Laterality: N/A;  . CARDIAC CATHETERIZATION N/A 07/02/2015   Procedure: Left Heart Cath and Coronary Angiography;  Surgeon: [name], MD;  Location: [facility];  Service: Cardiovascular;  Laterality: N/A;  . CORONARY ANGIOPLASTY WITH STENT PLACEMENT  05/13/2012   DES to LAD; she has total RCA with left to rt coll. normal LV function done for positive nuc study  . DILATION AND CURETTAGE OF UTERUS  1960's; 1970's; 1980   "probably 3" (05/13/2012)  . FOREARM / WRIST TENDON LESION EXCISION  ?11/2001   "right; did waver to try to get ulnar out of hole that it had cut" (05/13/2012  . INGUINAL HERNIA REPAIR  ~ 1966; 07/21/2002   "right; left" (05/13/2012)  . LEFT HEART CATHETERIZATION WITH CORONARY ANGIOGRAM N/A 05/13/2012   Procedure: LEFT HEART CATHETERIZATION WITH CORONARY ANGIOGRAM;  Surgeon: [name], MD;  Location: [facility];  Service: Cardiovascular;  Laterality: N/A;  . OSTEOTOMY AND ULNAR SHORTENING  07/21/2002    "right" (05/13/2012)  . TONSILLECTOMY AND ADENOIDECTOMY  1950's  . TUBAL LIGATION  1980    Family History  Problem Relation Age of Onset  . Hypertension Mother   . Diabetes Mother   . Hyperlipidemia Father   . Heart disease Father   . Stroke Father   . Heart disease Sister   . Diabetes Sister   . Heart disease Brother   . Diabetes Maternal Aunt     Social History:  reports that she quit smoking about 31 years ago. Her smoking use included cigarettes. She has a 10.00 pack-year smoking history. She has never used smokeless tobacco. She reports  current alcohol use of about 12.0 standard drinks of alcohol per week. She reports that she does not use drugs.  Allergies:  Allergies  Allergen Reactions  . Statins Other (See Comments)    "intermittent loss of circulation; hands and arms will go to sleep; feet will get cramps; fatigue" (05/13/2012).  This occurred with Lipitor, Zocor, Crestor 5 mg qd, and she thinks pravastatin as well  . Amlodipine Itching  . Gadolinium Derivatives Itching and Nausea Only    Pt had severe nausea and itching on legs, neck and back, per dr [name] pt needs 13 hour prep before contrast in the future    Allergies as of 12/02/2019      Reactions   Statins Other (See Comments)   "intermittent loss of circulation; hands and arms will go to sleep; feet will get cramps; fatigue" (05/13/2012).  This occurred with Lipitor, Zocor, Crestor 5 mg qd, and she thinks pravastatin as well   Amlodipine Itching   Gadolinium Derivatives Itching, Nausea Only   Pt had severe nausea and itching on legs, neck and back, per dr [name] pt needs 13 hour prep before contrast in the future      Medication List       Accurate as of December 02, 2019 10:50 AM. If you have any questions, ask your nurse or doctor.        acetaminophen 500 MG tablet Commonly known as: TYLENOL Take 500 mg by mouth every 6 (six) hours as needed for mild pain or headache.   aspirin 81 MG tablet Take 81  mg by mouth daily.   calcium-vitamin D 500-200 MG-UNIT tablet Commonly known as: OSCAL WITH D Take 2 tablets by mouth daily.   carvedilol 6.25 MG tablet Commonly known as: COREG Take 1 tablet (6.25 mg total) by mouth 2 (two) times daily with a meal.   clopidogrel 75 MG tablet Commonly known as: PLAVIX TAKE 1 TABLET(75 MG) BY MOUTH DAILY   diclofenac sodium 1 % Gel Commonly known as: VOLTAREN Apply 2 g topically daily as needed. For pain   ezetimibe 10 MG tablet Commonly known as: ZETIA TAKE 1 TABLET(10 MG) BY MOUTH DAILY What changed: See the new instructions.   fenofibrate 160 MG tablet TAKE 1 TABLET(160 MG) BY MOUTH DAILY   glucose blood test strip Commonly known as: OneTouch Verio Use to test blood sugar once daily Dx code E11.9   hydrocortisone valerate cream 0.2 % Commonly known as: WESTCORT Apply 1 application topically daily as needed (ITCHING).   irbesartan 300 MG tablet Commonly known as: AVAPRO Take 0.5 tablets (150 mg total) by mouth in the morning and at bedtime. TAKE 1 TABLET(300 MG) BY MOUTH DAILY   levothyroxine 25 MCG tablet Commonly known as: SYNTHROID TAKE 1 TABLET(25 MCG) BY MOUTH DAILY BEFORE BREAKFAST   metFORMIN 500 MG 24 hr tablet Commonly known as: GLUCOPHAGE-XR Take 2 tablets by mouth daily with supper.   multivitamin with minerals Tabs tablet Take 1 tablet by mouth daily.   nitroGLYCERIN 0.4 MG SL tablet Commonly known as: NITROSTAT PLACE 1 TABLET UNDER THE TONGUE EVERY 5 MINUTES AS NEEDED FOR CHEST PAIN   Repatha SureClick 664 MG/ML [place] Generic drug: Evolocumab Inject 1 pen into the skin every 14 (fourteen) days.   Vitamin D3 50 MCG (2000 UT) Tabs Take 2,000 Units by mouth 2 (two) times daily.       LABS:  Lab on 11/29/2019  Component Date Value Ref Range Status  . VITD 11/29/2019 49.20  30.00 - 100.00 ng/mL Final  . Free T4 11/29/2019 1.17  0.60 - 1.60 ng/dL Final   Comment: Specimens from patients who are undergoing  biotin therapy and /or ingesting biotin supplements may contain high levels of biotin.  The higher biotin concentration in these specimens interferes with this Free T4 assay.  Specimens that contain high levels  of biotin may cause false high results for this Free T4 assay.  Please interpret results in light of the total clinical presentation of the patient.    Marland Kitchen TSH 11/29/2019 4.23  0.35 - 4.50 uIU/mL Final  . Sodium 11/29/2019 133* 135 - 145 mEq/L Final  . Potassium 11/29/2019 4.1  3.5 - 5.1 mEq/L Final  . Chloride 11/29/2019 102  96 - 112 mEq/L Final  . CO2 11/29/2019 23  19 - 32 mEq/L Final  . Glucose, Bld 11/29/2019 117* 70 - 99 mg/dL Final  . BUN 11/29/2019 14  6 - 23 mg/dL Final  . Creatinine, Ser 11/29/2019 0.66  0.40 - 1.20 mg/dL Final  . Total Bilirubin 11/29/2019 0.4  0.2 - 1.2 mg/dL Final  . Alkaline Phosphatase 11/29/2019 44  39 - 117 U/L Final  . AST 11/29/2019 20  0 - 37 U/L Final  . ALT 11/29/2019 17  0 - 35 U/L Final  . Total Protein 11/29/2019 7.3  6.0 - 8.3 g/dL Final  . Albumin 11/29/2019 4.3  3.5 - 5.2 g/dL Final  . GFR 11/29/2019 86.88  >60.00 mL/min Final  . Calcium 11/29/2019 10.0  8.4 - 10.5 mg/dL Final  . Hgb A1c MFr Bld 11/29/2019 5.9  4.6 - 6.5 % Final   Glycemic Control Guidelines for People with Diabetes:Non Diabetic:  <6%Goal of Therapy: <7%Additional Action Suggested:  >8%       Review of Systems    OSTEOPENIA:   Lowest T score is at the right neck femur previously -2.4  This is now only 0.8 as of 11/14/2019  However BMD at the left femoral neck is now -2.0 compared to -1.8  Apparently had tried Fosamax in the past and this caused some GI side effects She received RECLAST infusion on 12/15/17   HYPERTENSION: Treated with Avapro, chlorthalidone half tablet and Coreg, Followed by PCP. Home BP checks regularly also   History of hyperlipidemia with some intolerance to statins, on Repatha and also on Zetia.  This is followed by cardiologist in the  lipid clinic.  She says that she was having muscle aches and was told to stop fenofibrate with which her muscle aches are improved  Fasting lipids as follows, has mild increase in triglycerides  Lab Results  Component Value Date   CHOL 132 10/14/2019   HDL 48 10/14/2019   LDLCALC 55 10/14/2019   LDLDIRECT 130.1 04/04/2014   TRIG 171 (H) 10/14/2019   CHOLHDL 2.8 10/14/2019      HYPOTHYROIDISM: TSH level previously has been as high as 5.6  This spontaneously improved but in 05/2018 was mildly increased again  At that time she was complaining of some fatigue, constipation, dry skin, some cold intolerance and difficulty losing weight.  She is taking levothyroxine 25 mcg daily since she appeared to be symptomatic TSH is still fluctuating but she does not complain of recent fatigue  Her TSH is consistently normal  Lab Results  Component Value Date   TSH 4.23 11/29/2019   TSH 4.00 07/15/2019   TSH 2.61 03/14/2019   FREET4 1.17 11/29/2019   FREET4 1.14 07/15/2019   FREET4 1.18 03/14/2019  PHYSICAL EXAM:  BP 130/70 (BP Location: Left Arm, Patient Position: Sitting, Cuff Size: Normal)   Pulse 72   Ht 5' 1.5" (1.562 m)   Wt 137 lb 6.4 oz (62.3 kg)   SpO2 96%   BMI 25.54 kg/m      ASSESSMENT:   Prediabetes with impaired fasting glucose and also impaired glucose tolerance  Her A1c is very gradually improving down to 5.9  She is on 1000 mg of Metformin ER  She has fairly good blood sugars at home although lab glucose is 125 fasting Has not had any worsening of her prediabetes and Metformin is likely helping She usually trying to be consistent with her diet Activity level is limited   HYPOTHYROIDISM: TSH is normal on treatment She may have been mildly symptomatic before starting 25 mcg levothyroxine  OSTEOPENIA: Her osteopenia has improved at the right femoral neck although not clear how accurate this is since left femoral neck shows a worsening and the level is  now -2.0  She had Reclast in 2019 Although she thinks she had mild side effects for couple of days she is willing to take this again Renal function normal She needs Reclast every 2 years and the next injection will be in 11/2019  Lipids: Followed in cardiology clinic, currently not on fenofibrate  Mild hyponatremia still persisting, asymptomatic   PLAN:   She can reduce her Metformin to 1 tablet since her prediabetes is mild and her home readings are below 100 fairly often  She will check blood sugars 3 times a week with some readings fasting and other readings about 2 hours after meals Continue watching carbohydrates and high-fat meals  Continue levothyroxine 25 mcg for mild hypothyroidism  Stay on vitamin D supplements and calcium, will schedule Reclast  Follow-up in  4 months     Marlia Schewe 12/02/2019, 10:50 AM

## 2019-12-05 ENCOUNTER — Telehealth: Payer: Self-pay

## 2019-12-05 DIAGNOSIS — I10 Essential (primary) hypertension: Secondary | ICD-10-CM | POA: Diagnosis not present

## 2019-12-05 DIAGNOSIS — M109 Gout, unspecified: Secondary | ICD-10-CM | POA: Diagnosis not present

## 2019-12-05 DIAGNOSIS — I2510 Atherosclerotic heart disease of native coronary artery without angina pectoris: Secondary | ICD-10-CM | POA: Diagnosis not present

## 2019-12-05 DIAGNOSIS — E782 Mixed hyperlipidemia: Secondary | ICD-10-CM | POA: Diagnosis not present

## 2019-12-05 DIAGNOSIS — M67371 Transient synovitis, right ankle and foot: Secondary | ICD-10-CM | POA: Diagnosis not present

## 2019-12-05 DIAGNOSIS — M19071 Primary osteoarthritis, right ankle and foot: Secondary | ICD-10-CM | POA: Diagnosis not present
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
## 2019-12-06 ENCOUNTER — Other Ambulatory Visit: Payer: Self-pay | Admitting: Cardiovascular Disease

## 2019-12-09 DIAGNOSIS — M67371 Transient synovitis, right ankle and foot: Secondary | ICD-10-CM | POA: Diagnosis not present

## 2019-12-15 ENCOUNTER — Telehealth: Payer: Self-pay | Admitting: Cardiovascular Disease

## 2019-12-15 DIAGNOSIS — E785 Hyperlipidemia, unspecified: Secondary | ICD-10-CM

## 2019-12-15 DIAGNOSIS — Z79899 Other long term (current) drug therapy: Secondary | ICD-10-CM

## 2019-12-15 NOTE — Telephone Encounter (Signed)
   Pt c/o medication issue:  1. Name of Medication: fenofibrate 160 MG tablet  2. How are you currently taking this medication (dosage and times per day)? Pt has not taken this medication   3. Are you having a reaction (difficulty breathing--STAT)? no  4. What is your medication issue? PT was having some issues with cramping. Erasmo Downer advised her to stop taking this medication and do some other things to see what is causing the cramping. The patient would just like to follow up with Erasmo Downer to let her know how things have been going since she eliminated the medicine

## 2019-12-16 DIAGNOSIS — M25774 Osteophyte, right foot: Secondary | ICD-10-CM | POA: Diagnosis not present

## 2019-12-16 DIAGNOSIS — M67371 Transient synovitis, right ankle and foot: Secondary | ICD-10-CM | POA: Diagnosis not present

## 2019-12-16 MED ORDER — EZETIMIBE 10 MG PO TABS: 5.0000 mg | ORAL_TABLET | Freq: Every day | ORAL | 2 refills | Status: DC

## 2019-12-16 NOTE — Telephone Encounter (Signed)
Stop take fenofibrate 160mg , continue Repatha 140mg  every 14 day,   Repeat fasting lipid panel in 2 week. Will consider Vascepa/Lovaza to treat TG.

## 2019-12-16 NOTE — Telephone Encounter (Signed)
°  Patient returned call from [facility] and got answering service last night and is requesting a call back

## 2019-12-22 DIAGNOSIS — M67371 Transient synovitis, right ankle and foot: Secondary | ICD-10-CM | POA: Diagnosis not present

## 2019-12-25 DIAGNOSIS — H353112 Nonexudative age-related macular degeneration, right eye, intermediate dry stage: Secondary | ICD-10-CM | POA: Diagnosis not present

## 2020-01-02 DIAGNOSIS — E785 Hyperlipidemia, unspecified: Secondary | ICD-10-CM | POA: Diagnosis not present

## 2020-01-02 DIAGNOSIS — Z79899 Other long term (current) drug therapy: Secondary | ICD-10-CM | POA: Diagnosis not present

## 2020-01-03 LAB — LIPID PANEL WITH LDL/HDL RATIO
Cholesterol, Total: 154 mg/dL (ref 100–199)
HDL: 44 mg/dL (ref >39)
LDL Chol Calc (NIH): 72 mg/dL (ref 0–99)
LDL/HDL Ratio: 1.6 ratio (ref 0.0–3.2)
Triglycerides: 233 mg/dL — ABNORMAL HIGH (ref 0–149)
VLDL Cholesterol Cal: 38 mg/dL (ref 5–40)

## 2020-01-04 NOTE — Telephone Encounter (Signed)
Patient called to follow up on the situation with the Reclast. Please advise. Ph# [phone]

## 2020-01-05 DIAGNOSIS — M7751 Other enthesopathy of right foot: Secondary | ICD-10-CM | POA: Diagnosis not present

## 2020-01-05 DIAGNOSIS — M67371 Transient synovitis, right ankle and foot: Secondary | ICD-10-CM | POA: Diagnosis not present

## 2020-01-05 NOTE — Telephone Encounter (Signed)
Called pt and gave her phone number to schedule Reclast infusion

## 2020-01-06 ENCOUNTER — Telehealth: Payer: Self-pay | Admitting: Cardiovascular Disease

## 2020-01-06 NOTE — Telephone Encounter (Signed)
Patient is requesting to discuss results from lab work completed on 01/02/20. Please call.

## 2020-01-06 NOTE — Telephone Encounter (Signed)
Attempted to return patients call regarding lab work results.  Phone rang 1 time and went to voicemail, voicemail full and unable to leave message at this time. Will try again at a later date.

## 2020-01-09 ENCOUNTER — Other Ambulatory Visit: Payer: Self-pay

## 2020-01-09 DIAGNOSIS — M85851 Other specified disorders of bone density and structure, right thigh: Secondary | ICD-10-CM

## 2020-01-09 DIAGNOSIS — E782 Mixed hyperlipidemia: Secondary | ICD-10-CM | POA: Diagnosis not present

## 2020-01-09 DIAGNOSIS — I10 Essential (primary) hypertension: Secondary | ICD-10-CM | POA: Diagnosis not present

## 2020-01-09 DIAGNOSIS — I2510 Atherosclerotic heart disease of native coronary artery without angina pectoris: Secondary | ICD-10-CM | POA: Diagnosis not present

## 2020-01-09 NOTE — Telephone Encounter (Signed)
Follow up   Pt is returning call from [facility]

## 2020-01-09 NOTE — Telephone Encounter (Signed)
Brittany Harp, MD  01/03/2020 7:06 AM EDT     Excellent FLP and LFTs    l Lm to call back .Adonis Housekeeper

## 2020-01-09 NOTE — Telephone Encounter (Signed)
Patient stated in order to schedule her Reclast with short stay, they informed her that her labs needed to be redone because the labs had "expired" on August 13. Please advise. Ph# [phone]

## 2020-01-09 NOTE — Telephone Encounter (Signed)
Pt aware of lab results ./cy 

## 2020-01-09 NOTE — Telephone Encounter (Signed)
Called pt and scheduled her for new blood work.

## 2020-01-09 NOTE — Telephone Encounter (Signed)
Patient returning a call from Friday about lab results. Please call back

## 2020-01-10 ENCOUNTER — Other Ambulatory Visit: Payer: Self-pay

## 2020-01-10 ENCOUNTER — Other Ambulatory Visit (INDEPENDENT_AMBULATORY_CARE_PROVIDER_SITE_OTHER): Payer: Medicare Other

## 2020-01-10 DIAGNOSIS — M85851 Other specified disorders of bone density and structure, right thigh: Secondary | ICD-10-CM

## 2020-01-10 LAB — BASIC METABOLIC PANEL
BUN: 9 mg/dL (ref 6–23)
CO2: 28 mEq/L (ref 19–32)
Calcium: 9.7 mg/dL (ref 8.4–10.5)
Chloride: 98 mEq/L (ref 96–112)
Creatinine, Ser: 0.64 mg/dL (ref 0.40–1.20)
GFR: 89.99 mL/min (ref >60.00)
Glucose, Bld: 126 mg/dL — ABNORMAL HIGH (ref 70–99)
Potassium: 3.9 mEq/L (ref 3.5–5.1)
Sodium: 134 mEq/L — ABNORMAL LOW (ref 135–145)

## 2020-01-12 ENCOUNTER — Telehealth: Payer: Self-pay | Admitting: Cardiovascular Disease

## 2020-01-12 DIAGNOSIS — I771 Stricture of artery: Secondary | ICD-10-CM | POA: Diagnosis not present

## 2020-01-12 DIAGNOSIS — I10 Essential (primary) hypertension: Secondary | ICD-10-CM | POA: Diagnosis not present

## 2020-01-12 MED ORDER — CARVEDILOL 6.25 MG PO TABS: 9.3700 mg | ORAL_TABLET | Freq: Two times a day (BID) | ORAL | 0 refills | Status: DC

## 2020-01-12 NOTE — Telephone Encounter (Signed)
Pt c/o BP issue: STAT if pt c/o blurred vision, one-sided weakness or slurred speech  1. What are your last 5 BP readings? 179/102  2. Are you having any other symptoms (ex. Dizziness, headache, blurred vision, passed out)? States she just feels bad, no dizziness or headache, no blurred vision  3. What is your BP issue? no  Patient states she woke up this morning and her BP was very high. She also states she would like to discuss the regulation of her medications.

## 2020-01-12 NOTE — Telephone Encounter (Signed)
NP/PCP saw her this morning and increased carvedilol 6.25mg  twice daily.   162/71 pulse not reported. PCP increased carvedilol 9.375 mg and order ultrasound.  Stopped chlorthalidone d/t low BP and gout flared up and PCP wants to avoid chlorthalidone for now.

## 2020-01-13 DIAGNOSIS — I771 Stricture of artery: Secondary | ICD-10-CM | POA: Diagnosis not present

## 2020-01-13 DIAGNOSIS — I10 Essential (primary) hypertension: Secondary | ICD-10-CM | POA: Diagnosis not present

## 2020-01-16 ENCOUNTER — Other Ambulatory Visit (HOSPITAL_COMMUNITY): Payer: Self-pay | Admitting: *Deleted

## 2020-01-17 ENCOUNTER — Ambulatory Visit (HOSPITAL_COMMUNITY)
Admission: RE | Admit: 2020-01-17 | Discharge: 2020-01-17 | Disposition: A | Discharge status: Home / Self Care | Payer: Medicare Other | Source: Ambulatory Visit | Attending: Endocrinology | Admitting: Endocrinology

## 2020-01-17 ENCOUNTER — Other Ambulatory Visit: Payer: Self-pay

## 2020-01-17 DIAGNOSIS — M85851 Other specified disorders of bone density and structure, right thigh: Secondary | ICD-10-CM | POA: Diagnosis not present

## 2020-01-17 MED ORDER — ZOLEDRONIC ACID 5 MG/100ML IV SOLN: 5.0000 mg | Freq: Once | INTRAVENOUS | Status: DC

## 2020-01-17 MED ORDER — ZOLEDRONIC ACID 5 MG/100ML IV SOLN
INTRAVENOUS | Status: AC
  Administered 2020-01-17: 5 mg
  Filled 2020-01-17: qty 100

## 2020-01-20 DIAGNOSIS — Z20822 Contact with and (suspected) exposure to covid-19: Secondary | ICD-10-CM | POA: Diagnosis not present

## 2020-01-24 DIAGNOSIS — H353112 Nonexudative age-related macular degeneration, right eye, intermediate dry stage: Secondary | ICD-10-CM | POA: Diagnosis not present

## 2020-01-26 DIAGNOSIS — M84374A Stress fracture, right foot, initial encounter for fracture: Secondary | ICD-10-CM | POA: Diagnosis not present

## 2020-01-30 DIAGNOSIS — Z124 Encounter for screening for malignant neoplasm of cervix: Secondary | ICD-10-CM | POA: Diagnosis not present

## 2020-01-30 DIAGNOSIS — Z6824 Body mass index (BMI) 24.0-24.9, adult: Secondary | ICD-10-CM | POA: Diagnosis not present

## 2020-01-30 DIAGNOSIS — Z1231 Encounter for screening mammogram for malignant neoplasm of breast: Secondary | ICD-10-CM | POA: Diagnosis not present

## 2020-02-02 ENCOUNTER — Other Ambulatory Visit: Payer: Self-pay | Admitting: Endocrinology

## 2020-02-02 DIAGNOSIS — M84374D Stress fracture, right foot, subsequent encounter for fracture with routine healing: Secondary | ICD-10-CM | POA: Diagnosis not present

## 2020-02-07 DIAGNOSIS — Z789 Other specified health status: Secondary | ICD-10-CM | POA: Diagnosis not present

## 2020-02-07 DIAGNOSIS — M79671 Pain in right foot: Secondary | ICD-10-CM | POA: Diagnosis not present

## 2020-02-07 DIAGNOSIS — E782 Mixed hyperlipidemia: Secondary | ICD-10-CM | POA: Diagnosis not present

## 2020-02-07 DIAGNOSIS — I700 Atherosclerosis of aorta: Secondary | ICD-10-CM | POA: Diagnosis not present

## 2020-02-07 DIAGNOSIS — I10 Essential (primary) hypertension: Secondary | ICD-10-CM | POA: Diagnosis not present

## 2020-02-16 ENCOUNTER — Telehealth: Payer: Self-pay | Admitting: Cardiovascular Disease

## 2020-02-16 DIAGNOSIS — M84374D Stress fracture, right foot, subsequent encounter for fracture with routine healing: Secondary | ICD-10-CM | POA: Diagnosis not present

## 2020-02-16 NOTE — Telephone Encounter (Signed)
PT called back returning Nurse's Call

## 2020-02-16 NOTE — Telephone Encounter (Signed)
Pt c/o BP issue: STAT if pt c/o blurred vision, one-sided weakness or slurred speech  1. What are your last 5 BP readings?  Patient states she is on her way home and she does not have readings available. She would like a call back in 1 hour (1:45 PM).  2. Are you having any other symptoms (ex. Dizziness, headache, blurred vision, passed out)? No   3. What is your BP issue? BP has been elevated.

## 2020-02-16 NOTE — Telephone Encounter (Signed)
Left message for pt to call.

## 2020-02-21 DIAGNOSIS — M19071 Primary osteoarthritis, right ankle and foot: Secondary | ICD-10-CM | POA: Diagnosis not present

## 2020-02-21 DIAGNOSIS — M79671 Pain in right foot: Secondary | ICD-10-CM | POA: Diagnosis not present

## 2020-02-22 DIAGNOSIS — I10 Essential (primary) hypertension: Secondary | ICD-10-CM | POA: Diagnosis not present

## 2020-02-22 NOTE — Telephone Encounter (Signed)
LM TO CALL BACK ./CY 

## 2020-02-22 NOTE — Telephone Encounter (Signed)
Pt returning call

## 2020-02-23 DIAGNOSIS — H353112 Nonexudative age-related macular degeneration, right eye, intermediate dry stage: Secondary | ICD-10-CM | POA: Diagnosis not present

## 2020-02-23 DIAGNOSIS — M5459 Other low back pain: Secondary | ICD-10-CM | POA: Diagnosis not present

## 2020-02-23 DIAGNOSIS — M25552 Pain in left hip: Secondary | ICD-10-CM | POA: Diagnosis not present

## 2020-02-23 NOTE — Telephone Encounter (Signed)
Returned patient's call. Pt reports elevated blood pressures recently, as high as 201/113. Dr. Felipa Eth added diltiazem 120 mg daily on 02/13/20. Patient reports she has not taken her blood pressure the last 2 days. Reinforced that taking and recording blood pressure daily, especially when beginning a new medication, is very important. Pt states she will resume taking her blood pressure daily, record the readings, and bring them with her to her appointment with Dr. Gwenlyn Found on 02/28/20. Pt verbalizes understanding that she will call our office if needed prior to this appointment.

## 2020-02-28 ENCOUNTER — Encounter: Payer: Self-pay | Admitting: Cardiovascular Disease

## 2020-02-28 ENCOUNTER — Other Ambulatory Visit: Payer: Self-pay | Admitting: Endocrinology

## 2020-02-28 ENCOUNTER — Other Ambulatory Visit: Payer: Self-pay

## 2020-02-28 ENCOUNTER — Other Ambulatory Visit: Payer: Self-pay | Admitting: Cardiovascular Disease

## 2020-02-28 ENCOUNTER — Ambulatory Visit (INDEPENDENT_AMBULATORY_CARE_PROVIDER_SITE_OTHER): Payer: Medicare Other | Admitting: Cardiovascular Disease

## 2020-02-28 VITALS — BP 176/80 | HR 64 | Ht 61.5 in | Wt 133.0 lb

## 2020-02-28 DIAGNOSIS — I10 Essential (primary) hypertension: Secondary | ICD-10-CM

## 2020-02-28 DIAGNOSIS — I701 Atherosclerosis of renal artery: Secondary | ICD-10-CM

## 2020-02-28 DIAGNOSIS — Z79899 Other long term (current) drug therapy: Secondary | ICD-10-CM | POA: Diagnosis not present

## 2020-02-28 MED ORDER — CHLORTHALIDONE 25 MG PO TABS: 25.0000 mg | ORAL_TABLET | Freq: Every day | ORAL | 3 refills | Status: DC

## 2020-02-28 NOTE — Assessment & Plan Note (Signed)
History of essential hypertension a blood pressure measured today 176/80.  Over the last 2 months her blood pressures have been trending higher.  She is on carvedilol 12.5 mg a day in addition to diltiazem XR 240 mg a day and Avapro 300 mg a day.  She was on chlorthalidone 25 mg a day which was discontinued because of the question of gout which has since been refuted.  I Brittany Figueroa put her back on chlorthalidone, have her keep a blood pressure log for 3 days and see a Pharm.D. in 4 weeks.  We will also check renal Doppler studies.

## 2020-02-28 NOTE — Assessment & Plan Note (Signed)
History of hyperlipidemia on statin therapy with lipid profile performed 01/02/2020 revealing total cholesterol 154, LDL 72 and HDL 44.

## 2020-02-28 NOTE — Patient Instructions (Signed)
Medication Instructions:  RESTART- Chlorthalidone 25 mg by mouth daily  *If you need a refill on your cardiac medications before your next appointment, please call your pharmacy*   Lab Work: BMP in 2 weeks  If you have labs (blood work) drawn today and your tests are completely normal, you will receive your results only by: Marland Kitchen MyChart Message (if you have MyChart) OR . A paper copy in the mail If you have any lab test that is abnormal or we need to change your treatment, we will call you to review the results.   Testing/Procedures: Your physician has requested that you have a renal artery duplex. During this test, an ultrasound is used to evaluate blood flow to the kidneys. Allow one hour for this exam. Do not eat after midnight the day before and avoid carbonated beverages. Take your medications as you usually do.   Follow-Up: At [facility], you and your health needs are our priority.  As part of our continuing mission to provide you with exceptional heart care, we have created designated Provider Care Teams.  These Care Teams include your primary Cardiologist (physician) and Advanced Practice Providers (APPs -  Physician Assistants and Nurse Practitioners) who all work together to provide you with the care you need, when you need it.  We recommend signing up for the patient portal called "MyChart".  Sign up information is provided on this After Visit Summary.  MyChart is used to connect with patients for Virtual Visits (Telemedicine).  Patients are able to view lab/test results, encounter notes, upcoming appointments, etc.  Non-urgent messages can be sent to your provider as well.   To learn more about what you can do with MyChart, go to [email or web address].    Your next appointment:   3 month(s)  The format for your next appointment:   In Person  Provider:   You may see Quay Burow, MD or one of the following Advanced Practice Providers on your designated Care Team:     Kerin Ransom, PA-C  [place], [place]  Coletta Memos, [place]    Other Instructions  1 Month with [place]

## 2020-02-28 NOTE — Assessment & Plan Note (Signed)
History of CAD status post catheterization by myself 05/13/2012 revealing an 80% proximal LAD lesion which I stented using a drug-eluting stent.  She did have a total dominant RCA with left-to-right collaterals and normal LV function.  She has had Myoview's in the past that show ischemia in the RCA territory explainable by her anatomy.  She denies chest pain or shortness of breath.

## 2020-02-28 NOTE — Progress Notes (Signed)
02/28/2020 Brittany Figueroa   1940-11-18  [id number]  Primary Physician Lajean Manes, MD Primary Cardiologist: [name] MD FACP, [place], [place], [place]  HPI:  Brittany Figueroa is a 79 y.o.   mildly overweight divorced Caucasian female, mother to 2, grandmother to 3 grandchildren, who I last saw in the office 10/06/2019. She has a history of hypertension and hyperlipidemia. I saw her in December of 2013 with new anterolateral T-wave inversion and a Myoview that showed inferolateral ischemia that was new compared to a prior study. She did have some unusual symptoms at that time. Based on this, I catheterized her on May 13, 2012, revealing an 80% proximal LAD lesion, which I stented using a drug-eluting stent, as well as a total dominant RCA with left to right collaterals and normal LV function. Since stenting her LAD, she is ultimately asymptomatic. Her other problems include hypertension and hyperlipidemia. I have reviewed her blood pressures, which were recorded during cardiac rehab, which were all normal. Her recent blood work revealed a total cholesterol of 161, LDL of 89 and HDL of 40.she denies chest pain or shortness of breath. She had a recent Myoview stress test performed several months ago that showed ischemia in the RCA territory but otherwise unremarkable is explainable by her known total dominant RCA with left to right collaterals. Chief complaint of excruciating neck pain thought to be related to cervical disc disease. She was evaluated by Dr. Because of her neck pain demonstrated cervical disc disease requiring fusion. Because of this she will need to undergo pharmacologic Myoview stress testing to risk stratify her. She underwent cervical discectomy by Dr. Deri Fuelling in 2016 with excellent clinical result. She no longer is in pain. Her major issue now is treatment of her hyperlipidemia. She is statin intolerant and complains of short-term memory loss.  She did contract COVID-19 in  July of last year but has recovered.  She is tolerating Repatha without side effects excellent lipid profile with LDL of 74 measured 03/14/2019.    Since I saw her 5 months ago she has done well although her blood pressure has been trending higher in the last 2 months for unclear reasons.  Her chlorthalidone was discontinued because of pain in her foot and a question of gout but this is no longer an issue.  Her thought carvedilol and diltiazem were uptitrated.  Her blood pressure still is elevated for unclear reasons.   Current Meds  Medication Sig  . acetaminophen (TYLENOL) 500 MG tablet Take 500 mg by mouth every 6 (six) hours as needed for mild pain or headache.  Marland Kitchen aspirin 81 MG tablet Take 81 mg by mouth daily.  . calcium-vitamin D (OSCAL WITH D) 500-200 MG-UNIT per tablet Take 2 tablets by mouth daily.  . carvedilol (COREG) 12.5 MG tablet Take 12.5 mg by mouth 2 (two) times daily with a meal.  . Cholecalciferol (VITAMIN D3) 2000 UNITS TABS Take 2,000 Units by mouth 2 (two) times daily.  . clopidogrel (PLAVIX) 75 MG tablet TAKE 1 TABLET(75 MG) BY MOUTH DAILY  . diclofenac sodium (VOLTAREN) 1 % GEL Apply 2 g topically daily as needed. For pain  . DILT-XR 240 MG 24 hr capsule Take 240 mg by mouth daily.  . Evolocumab (REPATHA SURECLICK) 696 MG/ML SOAJ Inject 1 pen into the skin every 14 (fourteen) days.  Marland Kitchen ezetimibe (ZETIA) 10 MG tablet Take 0.5 tablets (5 mg total) by mouth daily.  . fenofibrate 160 MG tablet Take  160 mg by mouth daily.  Pt takes 1/2 tablet  . glucose blood (ONETOUCH VERIO) test strip Use to test blood sugar once daily Dx code E11.9  . hydrocortisone valerate cream (WESTCORT) 0.2 % Apply 1 application topically daily as needed (ITCHING).  Marland Kitchen irbesartan (AVAPRO) 300 MG tablet Take 0.5 tablets (150 mg total) by mouth in the morning and at bedtime. TAKE 1 TABLET(300 MG) BY MOUTH DAILY  . levothyroxine (SYNTHROID) 25 MCG tablet TAKE 1 TABLET(25 MCG) BY MOUTH DAILY BEFORE  BREAKFAST  . metFORMIN (GLUCOPHAGE) 500 MG tablet Take by mouth daily.  . Multiple Vitamin (MULTIVITAMIN WITH MINERALS) TABS Take 1 tablet by mouth daily.  . nitroGLYCERIN (NITROSTAT) 0.4 MG SL tablet PLACE 1 TABLET UNDER THE TONGUE EVERY 5 MINUTES AS NEEDED FOR CHEST PAIN  . [DISCONTINUED] carvedilol (COREG) 6.25 MG tablet Take 1.5 tablets (9.37 mg total) by mouth 2 (two) times daily with a meal.  . [DISCONTINUED] metFORMIN (GLUCOPHAGE-XR) 500 MG 24 hr tablet TAKE 2 TABLETS BY MOUTH DAILY WITH SUPPER     Allergies  Allergen Reactions  . Statins Other (See Comments)    "intermittent loss of circulation; hands and arms will go to sleep; feet will get cramps; fatigue" (05/13/2012).  This occurred with Lipitor, Zocor, Crestor 5 mg qd, and she thinks pravastatin as well  . Amlodipine Itching  . Gadolinium Derivatives Itching and Nausea Only    Pt had severe nausea and itching on legs, neck and back, per dr [name] pt needs 13 hour prep before contrast in the future    Social History   Socioeconomic History  . Marital status: Divorced    Spouse name: Not on file  . Number of children: Not on file  . Years of education: Not on file  . Highest education level: Not on file  Occupational History  . Not on file  Tobacco Use  . Smoking status: Former Smoker    Packs/day: 0.50    Years: 20.00    Pack years: 10.00    Types: Cigarettes    Quit date: 07/05/1988    Years since quitting: 31.6  . Smokeless tobacco: Never Used  Substance and Sexual Activity  . Alcohol use: Yes    Alcohol/week: 12.0 standard drinks    Types: 12 Glasses of wine per week    Comment: 05/13/2012 "6-8 oz glass of red wine q hs"   . Drug use: No  . Sexual activity: Never  Other Topics Concern  . Not on file  Social History Narrative  . Not on file   Social Determinants of Health   Financial Resource Strain:   . Difficulty of Paying Living Expenses: Not on file  Food Insecurity:   . Worried About Ship broker in the Last Year: Not on file  . Ran Out of Food in the Last Year: Not on file  Transportation Needs:   . Lack of Transportation (Medical): Not on file  . Lack of Transportation (Non-Medical): Not on file  Physical Activity:   . Days of Exercise per Week: Not on file  . Minutes of Exercise per Session: Not on file  Stress:   . Feeling of Stress : Not on file  Social Connections:   . Frequency of Communication with Friends and Family: Not on file  . Frequency of Social Gatherings with Friends and Family: Not on file  . Attends Religious Services: Not on file  . Active Member of Clubs or Organizations: Not on file  .  Attends Archivist Meetings: Not on file  . Marital Status: Not on file  Intimate Partner Violence:   . Fear of Current or Ex-Partner: Not on file  . Emotionally Abused: Not on file  . Physically Abused: Not on file  . Sexually Abused: Not on file     Review of Systems: General: negative for chills, fever, night sweats or weight changes.  Cardiovascular: negative for chest pain, dyspnea on exertion, edema, orthopnea, palpitations, paroxysmal nocturnal dyspnea or shortness of breath Dermatological: negative for rash Respiratory: negative for cough or wheezing Urologic: negative for hematuria Abdominal: negative for nausea, vomiting, diarrhea, bright red blood per rectum, melena, or hematemesis Neurologic: negative for visual changes, syncope, or dizziness All other systems reviewed and are otherwise negative except as noted above.    Blood pressure (!) 176/80, pulse 64, height 5' 1.5" (1.562 m), weight 133 lb (60.3 kg), SpO2 96 %.  General appearance: alert and no distress Neck: no adenopathy, no carotid bruit, no JVD, supple, symmetrical, trachea midline and thyroid not enlarged, symmetric, no tenderness/mass/nodules Lungs: clear to auscultation bilaterally Heart: regular rate and rhythm, S1, S2 normal, no murmur, click, rub or gallop Extremities:  extremities normal, atraumatic, no cyanosis or edema Pulses: 2+ and symmetric Skin: Skin color, texture, turgor normal. No rashes or lesions Neurologic: Alert and oriented X 3, normal strength and tone. Normal symmetric reflexes. Normal coordination and gait  EKG sinus rhythm at 64 with left axis deviation and septal Q waves.  I personally reviewed this EKG  ASSESSMENT AND PLAN:   CAD- residual total RCA and Dx disease History of CAD status post catheterization by myself 05/13/2012 revealing an 80% proximal LAD lesion which I stented using a drug-eluting stent.  She did have a total dominant RCA with left-to-right collaterals and normal LV function.  She has had Myoview's in the past that show ischemia in the RCA territory explainable by her anatomy.  She denies chest pain or shortness of breath.  Dyslipidemia History of hyperlipidemia on statin therapy with lipid profile performed 01/02/2020 revealing total cholesterol 154, LDL 72 and HDL 44.  Essential hypertension History of essential hypertension a blood pressure measured today 176/80.  Over the last 2 months her blood pressures have been trending higher.  She is on carvedilol 12.5 mg a day in addition to diltiazem XR 240 mg a day and Avapro 300 mg a day.  She was on chlorthalidone 25 mg a day which was discontinued because of the question of gout which has since been refuted.  I Georgina Peer put her back on chlorthalidone, have her keep a blood pressure log for 3 days and see a Pharm.D. in 4 weeks.  We will also check renal Doppler studies.      [name] MD FACP,FACC,FAHA, [facility] 02/28/2020 5:11 PM

## 2020-02-29 ENCOUNTER — Telehealth: Payer: Self-pay | Admitting: Cardiovascular Disease

## 2020-02-29 NOTE — Telephone Encounter (Signed)
Pt c/o medication issue:  1. Name of Medication: chlorthalidone (HYGROTON) 25 MG tablet  2. How are you currently taking this medication (dosage and times per day)? 12.5mg  daily  3. Are you having a reaction (difficulty breathing--STAT)? no  4. What is your medication issue? Patient states that Dr. Gwenlyn Found reports she is taking 25mg  daily. But, the patient states that the bottle is for 25mg  but the instructions state to take 0.5 tablet by mouth daily. She states she will continue to take 12.5mg  daily until she is told otherwise. You can leave a VM if she does not answer.

## 2020-02-29 NOTE — Telephone Encounter (Signed)
Per pt was taking Chlorthalidone 12.5 mg previously and pt restarted this at 12.5 mg not the 25 mg  Per pt this was stopped previously due to hypotension Pt encouraged to monitor B/P and if no improvement to call back Will forward to Dr Gwenlyn Found for review .Adonis Housekeeper

## 2020-03-05 DIAGNOSIS — I10 Essential (primary) hypertension: Secondary | ICD-10-CM | POA: Diagnosis not present

## 2020-03-05 DIAGNOSIS — Z23 Encounter for immunization: Secondary | ICD-10-CM | POA: Diagnosis not present

## 2020-03-07 ENCOUNTER — Ambulatory Visit (HOSPITAL_COMMUNITY)
Admission: RE | Admit: 2020-03-07 | Discharge: 2020-03-07 | Disposition: A | Discharge status: Home / Self Care | Payer: Medicare Other | Source: Ambulatory Visit | Attending: Cardiovascular Disease | Admitting: Cardiovascular Disease

## 2020-03-07 ENCOUNTER — Other Ambulatory Visit: Payer: Self-pay

## 2020-03-07 DIAGNOSIS — I10 Essential (primary) hypertension: Secondary | ICD-10-CM | POA: Diagnosis not present

## 2020-03-14 ENCOUNTER — Telehealth: Payer: Self-pay

## 2020-03-14 ENCOUNTER — Encounter: Payer: Self-pay | Admitting: Cardiovascular Disease

## 2020-03-14 NOTE — Telephone Encounter (Signed)
Patient called in to office with concerns of having slight dizziness in the mornings when waking up. Patient states her blood pressure has been 166'A - 630'Z systolic over 60'F-09'N diastolic.  Patient states that she was told to call the office and let Erasmo Downer know if her diastolic reached 62. Patient states her HR is in the 60's and is regular. Patient denies any other symptoms at this time. Patient states she is currently taking   12.5mg  Carvedilol twice daily  12.5 mg (1/2) tablet Chlorthalidone daily  240 mg diltiazem daily  300mg  irbesartan daily   Patient states that she would like PharmD and Dr. Gwenlyn Found  to review her medications and see if there are any recommendations or changes that need to be made.   Advised patient I would forward message.

## 2020-03-14 NOTE — Telephone Encounter (Signed)
Spoke with patient.  Took several minutes to clarify carvedilol dose - she is actually taking 12.5 mg bid.  States dizziness started after increased that from 6.25 mg bid.  HR running 55-65.  BP 120-140/60-70.  Advised patient that BP looks good overall and I don't expect that the dizziness is related to low pressure.  Will have her cut the evening dose of carvedilol back to 6.25 mg but leave the daytime dose at 12.5 mg.  Scheduled her for BP follow up with [facility] for Nov 16.

## 2020-03-14 NOTE — Telephone Encounter (Signed)
Spoke with patient in regards to Renal Artery Duplex results. Results given to patient and patient verbalized understanding. Patient states that on her previous after visit summary from Dr. Gwenlyn Found it states that she has diabetes, and patient states she has not been diagnosed with diabetes only pre diabetes. Patient states her previous Hgb A1C is 5.9 and can be reviewed in chart. Patient would like to know if diagnosis of diabetes can be removed from chart. Advised patient I would forward message to Dr. Gwenlyn Found for review.

## 2020-03-14 NOTE — Telephone Encounter (Signed)
error 

## 2020-03-15 DIAGNOSIS — E782 Mixed hyperlipidemia: Secondary | ICD-10-CM | POA: Diagnosis not present

## 2020-03-15 DIAGNOSIS — I2510 Atherosclerotic heart disease of native coronary artery without angina pectoris: Secondary | ICD-10-CM | POA: Diagnosis not present

## 2020-03-15 DIAGNOSIS — I10 Essential (primary) hypertension: Secondary | ICD-10-CM | POA: Diagnosis not present

## 2020-03-16 ENCOUNTER — Encounter (HOSPITAL_COMMUNITY): Payer: Medicare Other

## 2020-03-20 DIAGNOSIS — I10 Essential (primary) hypertension: Secondary | ICD-10-CM | POA: Diagnosis not present

## 2020-03-20 DIAGNOSIS — Z79899 Other long term (current) drug therapy: Secondary | ICD-10-CM | POA: Diagnosis not present

## 2020-03-20 LAB — BASIC METABOLIC PANEL
BUN/Creatinine Ratio: 26 (ref 12–28)
BUN: 20 mg/dL (ref 8–27)
CO2: 25 mmol/L (ref 20–29)
Calcium: 10.3 mg/dL (ref 8.7–10.3)
Chloride: 99 mmol/L (ref 96–106)
Creatinine, Ser: 0.77 mg/dL (ref 0.57–1.00)
GFR calc Af Amer: 86 mL/min/{1.73_m2} (ref >59)
GFR calc non Af Amer: 75 mL/min/{1.73_m2} (ref >59)
Glucose: 112 mg/dL — ABNORMAL HIGH (ref 65–99)
Potassium: 4.2 mmol/L (ref 3.5–5.2)
Sodium: 138 mmol/L (ref 134–144)

## 2020-03-24 DIAGNOSIS — H353112 Nonexudative age-related macular degeneration, right eye, intermediate dry stage: Secondary | ICD-10-CM | POA: Diagnosis not present

## 2020-03-26 DIAGNOSIS — H25011 Cortical age-related cataract, right eye: Secondary | ICD-10-CM | POA: Diagnosis not present

## 2020-03-26 DIAGNOSIS — H353131 Nonexudative age-related macular degeneration, bilateral, early dry stage: Secondary | ICD-10-CM | POA: Diagnosis not present

## 2020-03-30 ENCOUNTER — Other Ambulatory Visit: Payer: Self-pay | Admitting: Endocrinology

## 2020-03-30 DIAGNOSIS — R7303 Prediabetes: Secondary | ICD-10-CM

## 2020-03-30 DIAGNOSIS — M8580 Other specified disorders of bone density and structure, unspecified site: Secondary | ICD-10-CM

## 2020-03-30 DIAGNOSIS — Z780 Asymptomatic menopausal state: Secondary | ICD-10-CM

## 2020-03-30 DIAGNOSIS — E039 Hypothyroidism, unspecified: Secondary | ICD-10-CM

## 2020-04-02 DIAGNOSIS — E782 Mixed hyperlipidemia: Secondary | ICD-10-CM | POA: Diagnosis not present

## 2020-04-02 DIAGNOSIS — I10 Essential (primary) hypertension: Secondary | ICD-10-CM | POA: Diagnosis not present

## 2020-04-02 DIAGNOSIS — I2510 Atherosclerotic heart disease of native coronary artery without angina pectoris: Secondary | ICD-10-CM | POA: Diagnosis not present

## 2020-04-03 ENCOUNTER — Ambulatory Visit (INDEPENDENT_AMBULATORY_CARE_PROVIDER_SITE_OTHER): Payer: Medicare Other | Admitting: Pharmacist

## 2020-04-03 ENCOUNTER — Other Ambulatory Visit (INDEPENDENT_AMBULATORY_CARE_PROVIDER_SITE_OTHER): Payer: Medicare Other

## 2020-04-03 ENCOUNTER — Other Ambulatory Visit: Payer: Self-pay

## 2020-04-03 VITALS — BP 130/78 | HR 76 | Resp 17 | Ht 61.5 in | Wt 132.0 lb

## 2020-04-03 DIAGNOSIS — Z780 Asymptomatic menopausal state: Secondary | ICD-10-CM | POA: Diagnosis not present

## 2020-04-03 DIAGNOSIS — M8580 Other specified disorders of bone density and structure, unspecified site: Secondary | ICD-10-CM | POA: Diagnosis not present

## 2020-04-03 DIAGNOSIS — R7303 Prediabetes: Secondary | ICD-10-CM | POA: Diagnosis not present

## 2020-04-03 DIAGNOSIS — I10 Essential (primary) hypertension: Secondary | ICD-10-CM | POA: Diagnosis not present

## 2020-04-03 DIAGNOSIS — E039 Hypothyroidism, unspecified: Secondary | ICD-10-CM | POA: Diagnosis not present

## 2020-04-03 DIAGNOSIS — I701 Atherosclerosis of renal artery: Secondary | ICD-10-CM

## 2020-04-03 LAB — COMPREHENSIVE METABOLIC PANEL
ALT: 15 U/L (ref 0–35)
AST: 17 U/L (ref 0–37)
Albumin: 4.1 g/dL (ref 3.5–5.2)
Alkaline Phosphatase: 37 U/L — ABNORMAL LOW (ref 39–117)
BUN: 17 mg/dL (ref 6–23)
CO2: 28 mEq/L (ref 19–32)
Calcium: 9.7 mg/dL (ref 8.4–10.5)
Chloride: 97 mEq/L (ref 96–112)
Creatinine, Ser: 0.68 mg/dL (ref 0.40–1.20)
GFR: 84.15 mL/min (ref >60.00)
Glucose, Bld: 126 mg/dL — ABNORMAL HIGH (ref 70–99)
Potassium: 3.9 mEq/L (ref 3.5–5.1)
Sodium: 131 mEq/L — ABNORMAL LOW (ref 135–145)
Total Bilirubin: 0.4 mg/dL (ref 0.2–1.2)
Total Protein: 7 g/dL (ref 6.0–8.3)

## 2020-04-03 LAB — HEMOGLOBIN A1C: Hgb A1c MFr Bld: 6.7 % — ABNORMAL HIGH (ref 4.6–6.5)

## 2020-04-03 LAB — T4, FREE: Free T4: 1.13 ng/dL (ref 0.60–1.60)

## 2020-04-03 LAB — TSH: TSH: 4.47 u[IU]/mL (ref 0.35–4.50)

## 2020-04-03 NOTE — Progress Notes (Signed)
Patient ID: Brittany Figueroa                 DOB: [birth year]                      MRN: [id number]     HPI: Brittany Figueroa is a 79 y.o. female referred by Dr. Gwenlyn Figueroa to HTN clinic. PMH includes atypical angina, dyslipidemia, CAD, hypertension, and pre-diabetes.  We have been working with her for some time. Previously we had decreased her chlorthalidone to 1/2 tablet every other day, then discontinued, as she noted increased incontinence when taking it.  When that was discontinued, we increased the carvedilol to 9.375 mg bid to compensate.    Blood pressure remained stable for few months, but increased to 176/80 during OV with Dr Brittany Figueroa in October/2021. Chlorthalidone 25mg  daily was added to her regimen and carvedilol dose increased to 12.5mg  BID.   Her only complain today is pain on her right foot. She is not taking medication as prescribed. Reports she is taking chlorthalidone 12.5mg  daily, carvedilol 12.5mg  in AM and 6.25mg  in PM. Her only complain today is increase fatigue and constipation.  Current HTN meds:  Carvedilol 12.5mg  morning and 6.25mg  in evening chlorthalidone 12.5mg  daily Diltiazem XR 240mg  daily Irbesartan 150mg  twice daily  Previously tried:  Amlodipine - itching Chlorthalidone - urinary incontinence  BP goal: 130/80  Family History: mother and multiple siblings with hyperlipidemia, hypertension; knows little of father's family  Social History: former smoker, 12 glasses of wine per week  Diet: uses some salt, but only on specific items; lean meats, vegetables; no fried foods, low carbohydrates  Home BP readings:  14 readings; average 134/74 ; HR range 65-87bpm  Wt Readings from Last 3 Encounters:  04/06/20 137 lb 12.8 oz (62.5 kg)  04/03/20 132 lb (59.9 kg)  02/28/20 133 lb (60.3 kg)   BP Readings from Last 3 Encounters:  04/06/20 132/82  04/03/20 130/78  02/28/20 (!) 176/80   Pulse Readings from Last 3 Encounters:  04/06/20 68  04/03/20 76  02/28/20 64     Past Medical History:  Diagnosis Date  . Abnormal finding on EKG, new anterolateral T-wave inversions  05/14/2012  . Abnormal nuclear stress test, with infero-lateral ischemia 05/14/2012  . Arthritis    "right thumb; all my fingers" (05/13/2012), cervical spondylosis   . Atypical angina ([place]) 05/14/2012  . CAD (coronary artery disease), 05/13/12, with 80% LAD 05/14/2012  . Cancer ([place])    basal cell on facial in L temporal region    . CMC arthritis, thumb, degenerative   . Complication of anesthesia 12/2009   "had endoscopy; larynx went into spasms; stopped breathing for 15 seconds" (05/13/2012)  . GERD (gastroesophageal reflux disease) 2011  . Hypercholesteremia   . Hypertension   . S/P angioplasty with stent, LAD 05/13/12 05/14/2012  . Tuberculosis    test +- [place] [facility] - Dr. [name]- CXR- OK    Current Outpatient Medications on File Prior to Visit  Medication Sig Dispense Refill  . acetaminophen (TYLENOL) 500 MG tablet Take 500 mg by mouth every 6 (six) hours as needed for mild pain or headache.    Marland Kitchen aspirin 81 MG tablet Take 81 mg by mouth daily.    . calcium-vitamin D (OSCAL WITH D) 500-200 MG-UNIT per tablet Take 2 tablets by mouth daily.    . carvedilol (COREG) 12.5 MG tablet Take 12.5 mg by mouth 2 (two) times daily with a meal.    .  chlorthalidone (HYGROTON) 25 MG tablet Take 1 tablet (25 mg total) by mouth daily. 90 tablet 3  . Cholecalciferol (VITAMIN D3) 2000 UNITS TABS Take 2,000 Units by mouth 2 (two) times daily.    . clopidogrel (PLAVIX) 75 MG tablet TAKE 1 TABLET(75 MG) BY MOUTH DAILY 90 tablet 2  . diclofenac sodium (VOLTAREN) 1 % GEL Apply 2 g topically daily as needed. For pain    . DILT-XR 240 MG 24 hr capsule Take 240 mg by mouth daily.    . Evolocumab (REPATHA SURECLICK) 121 MG/ML SOAJ Inject 1 pen into the skin every 14 (fourteen) days. 2 pen 11  . ezetimibe (ZETIA) 10 MG tablet Take 0.5 tablets (5 mg total) by mouth daily. 90 tablet 2  . fenofibrate 160 MG  tablet Take 160 mg by mouth daily.  Pt takes 1/2 tablet    . glucose blood (ONETOUCH VERIO) test strip Use to test blood sugar once daily Dx code E11.9 30 each 4  . hydrocortisone valerate cream (WESTCORT) 0.2 % Apply 1 application topically daily as needed (ITCHING).    Marland Kitchen irbesartan (AVAPRO) 300 MG tablet Take 0.5 tablets (150 mg total) by mouth in the morning and at bedtime. TAKE 1 TABLET(300 MG) BY MOUTH DAILY 90 tablet 3  . levothyroxine (SYNTHROID) 25 MCG tablet TAKE 1 TABLET(25 MCG) BY MOUTH DAILY BEFORE BREAKFAST 90 tablet 1  . metFORMIN (GLUCOPHAGE) 500 MG tablet Take by mouth daily.    . Multiple Vitamin (MULTIVITAMIN WITH MINERALS) TABS Take 1 tablet by mouth daily.    . nitroGLYCERIN (NITROSTAT) 0.4 MG SL tablet PLACE 1 TABLET UNDER THE TONGUE EVERY 5 MINUTES AS NEEDED FOR CHEST PAIN 25 tablet 1   No current facility-administered medications on file prior to visit.    Allergies  Allergen Reactions  . Statins Other (See Comments)    "intermittent loss of circulation; hands and arms will go to sleep; feet will get cramps; fatigue" (05/13/2012).  This occurred with Lipitor, Zocor, Crestor 5 mg qd, and she thinks pravastatin as well  . Amlodipine Itching  . Gadolinium Derivatives Itching and Nausea Only    Pt had severe nausea and itching on legs, neck and back, per dr [name] pt needs 13 hour prep before contrast in the future    Blood pressure 130/78, pulse 76, resp. rate 17, height 5' 1.5" (1.562 m), weight 132 lb (59.9 kg), SpO2 99 %.  Essential hypertension Blood pressure greatly improved after adding chlorthalidone to therapy. Patient reports some fatigue during the day.  Will changecarvedilol to 6.25mg  in AM and 12.5mg  in PM to allow higher dose before bedtime and improve tolerability. Plan to decrease carvedilol to 6.25mg  twice daily if patient continues to experience fatigue.  Patient will follow up as needed with HTN clinic.   Brittany Figueroa PharmD, BCPS, [place] [id number] 04/06/2020 12:33 PM

## 2020-04-03 NOTE — Patient Instructions (Addendum)
Return for a follow up appointment AS NEEDED  Check your blood pressure at home daily (if able) and keep record of the readings.  Take your BP meds as follows: *CHANGE carvedilol 6.25mg  in the mornings and 12.5mg  at bedtime* if every energy remains low, please decrease dose to 6.25mg  (1/2 of 12.5mg  tablet) twice daily.  *INCREASE physical activity as possible, and take docusate sodium 100mg  daily no more than 2 weeks*  Bring all of your meds, your BP cuff and your record of home blood pressures to your next appointment.  Exercise as you're able, try to walk approximately 30 minutes per day.  Keep salt intake to a minimum, especially watch canned and prepared boxed foods.  Eat more fresh fruits and vegetables and fewer canned items.  Avoid eating in fast food restaurants.    HOW TO TAKE YOUR BLOOD PRESSURE: . Rest 5 minutes before taking your blood pressure. .  Don't smoke or drink caffeinated beverages for at least 30 minutes before. . Take your blood pressure before (not after) you eat. . Sit comfortably with your back supported and both feet on the floor (don't cross your legs). . Elevate your arm to heart level on a table or a desk. . Use the proper sized cuff. It should fit smoothly and snugly around your bare upper arm. There should be enough room to slip a fingertip under the cuff. The bottom edge of the cuff should be 1 inch above the crease of the elbow. . Ideally, take 3 measurements at one sitting and record the average.

## 2020-04-06 ENCOUNTER — Ambulatory Visit (INDEPENDENT_AMBULATORY_CARE_PROVIDER_SITE_OTHER): Payer: Medicare Other | Admitting: Endocrinology

## 2020-04-06 ENCOUNTER — Encounter: Payer: Self-pay | Admitting: Endocrinology

## 2020-04-06 ENCOUNTER — Other Ambulatory Visit: Payer: Self-pay

## 2020-04-06 ENCOUNTER — Encounter: Payer: Self-pay | Admitting: Pharmacist

## 2020-04-06 VITALS — BP 132/82 | HR 68 | Ht 62.0 in | Wt 137.8 lb

## 2020-04-06 DIAGNOSIS — I701 Atherosclerosis of renal artery: Secondary | ICD-10-CM | POA: Diagnosis not present

## 2020-04-06 DIAGNOSIS — R7303 Prediabetes: Secondary | ICD-10-CM | POA: Diagnosis not present

## 2020-04-06 DIAGNOSIS — I10 Essential (primary) hypertension: Secondary | ICD-10-CM

## 2020-04-06 DIAGNOSIS — E559 Vitamin D deficiency, unspecified: Secondary | ICD-10-CM | POA: Diagnosis not present

## 2020-04-06 DIAGNOSIS — E039 Hypothyroidism, unspecified: Secondary | ICD-10-CM

## 2020-04-06 DIAGNOSIS — E871 Hypo-osmolality and hyponatremia: Secondary | ICD-10-CM

## 2020-04-06 NOTE — Assessment & Plan Note (Signed)
Blood pressure greatly improved after adding chlorthalidone to therapy. Patient reports some fatigue during the day.  Will changecarvedilol to 6.25mg  in AM and 12.5mg  in PM to allow higher dose before bedtime and improve tolerability. Plan to decrease carvedilol to 6.25mg  twice daily if patient continues to experience fatigue.  Patient will follow up as needed with HTN clinic.

## 2020-04-06 NOTE — Progress Notes (Signed)
Patient ID: Brittany Figueroa, female   DOB: [birth year], 79 y.o.   MRN: [id number]           Chief complaint: Endocrinology follow-up    History of Present Illness:  PREVIOUS history: She may have been told about 10 years ago that she had prediabetes and reportedly had a normal glucose tolerance test. She was told by her PCP that she has type 2 diabetes based on an A1c of 6.5% in 07/2013 She has not had any other A1c is subsequently above normal, normal range and previous lab 6.2 or less She also has not had any abnormal blood sugars except a glucose of 125 in 3/16  Has history of increased fasting blood sugars ranging from 108-125 since about 2012  GLUCOSE TOLERANCE test on 05/23/15: Fasting glucose 110, two-hour glucose 192  RECENT history:   She has impaired fasting glucose and glucose intolerance  She has been intermittently on metformin ER since about 08/2015 In 1/18 when A1c was 6.5 and glucose 133 fasting she was told to start back on metformin and also improve her diet She was taking 1500 mg a day but could not tolerate this because of feeling of bloating  Current regimen: Metformin ER 500 mg 2 tablets a day  A1c is 6.7, previously 5.9   Current management and history     She is checking her blood sugars very infrequently and mostly started back checking last week  FASTING blood sugars range from 89-103 and evening 92, her meter has the wrong time again  She has had periodic steroids for various problems, mostly injectable  Has not been able to be active at all because of foot pain  Has gained some weight and diet sometimes is inconsistent  Lab fasting glucose was 126 but this was after a couple of cups of coffee  Previously was advised to just stay with 500 mg of Metformin since it did not appear that she needed much medication but she has gone back to 2 a day when she got her results for her A1c  Recent average blood sugar at home 96   Weight history: Previous  range 139-143  Wt Readings from Last 3 Encounters:  04/06/20 137 lb 12.8 oz (62.5 kg)  04/03/20 132 lb (59.9 kg)  02/28/20 133 lb (60.3 kg)    Lab Results  Component Value Date   HGBA1C 6.7 (H) 04/03/2020   HGBA1C 5.9 11/29/2019   HGBA1C 5.9 07/15/2019   Lab Results  Component Value Date   LDLCALC 72 01/02/2020   CREATININE 0.68 04/03/2020   HYPOTHYROIDISM and other problems reviewed today: See review of systems    Lab on 04/03/2020  Component Date Value Ref Range Status  . Sodium 04/03/2020 131* 135 - 145 mEq/L Final  . Potassium 04/03/2020 3.9  3.5 - 5.1 mEq/L Final  . Chloride 04/03/2020 97  96 - 112 mEq/L Final  . CO2 04/03/2020 28  19 - 32 mEq/L Final  . Glucose, Bld 04/03/2020 126* 70 - 99 mg/dL Final  . BUN 04/03/2020 17  6 - 23 mg/dL Final  . Creatinine, Ser 04/03/2020 0.68  0.40 - 1.20 mg/dL Final  . Total Bilirubin 04/03/2020 0.4  0.2 - 1.2 mg/dL Final  . Alkaline Phosphatase 04/03/2020 37* 39 - 117 U/L Final  . AST 04/03/2020 17  0 - 37 U/L Final  . ALT 04/03/2020 15  0 - 35 U/L Final  . Total Protein 04/03/2020 7.0  6.0 - 8.3 g/dL Final  .  Albumin 04/03/2020 4.1  3.5 - 5.2 g/dL Final  . GFR 04/03/2020 84.15  >60.00 mL/min Final   Calculated using the CKD-EPI Creatinine Equation (2021)  . Calcium 04/03/2020 9.7  8.4 - 10.5 mg/dL Final  . Free T4 04/03/2020 1.13  0.60 - 1.60 ng/dL Final   Comment: Specimens from patients who are undergoing biotin therapy and /or ingesting biotin supplements may contain high levels of biotin.  The higher biotin concentration in these specimens interferes with this Free T4 assay.  Specimens that contain high levels  of biotin may cause false high results for this Free T4 assay.  Please interpret results in light of the total clinical presentation of the patient.    Marland Kitchen TSH 04/03/2020 4.47  0.35 - 4.50 uIU/mL Final  . Hgb A1c MFr Bld 04/03/2020 6.7* 4.6 - 6.5 % Final   Glycemic Control Guidelines for People with Diabetes:Non  Diabetic:  <6%Goal of Therapy: <7%Additional Action Suggested:  >8%       Past Medical History:  Diagnosis Date  . Abnormal finding on EKG, new anterolateral T-wave inversions  05/14/2012  . Abnormal nuclear stress test, with infero-lateral ischemia 05/14/2012  . Arthritis    "right thumb; all my fingers" (05/13/2012), cervical spondylosis   . Atypical angina ([place]) 05/14/2012  . CAD (coronary artery disease), 05/13/12, with 80% LAD 05/14/2012  . Cancer ([place])    basal cell on facial in L temporal region    . CMC arthritis, thumb, degenerative   . Complication of anesthesia 12/2009   "had endoscopy; larynx went into spasms; stopped breathing for 15 seconds" (05/13/2012)  . GERD (gastroesophageal reflux disease) 2011  . Hypercholesteremia   . Hypertension   . S/P angioplasty with stent, LAD 05/13/12 05/14/2012  . Tuberculosis    test +- [place] [facility] - Dr. [name]- CXR- OK    Past Surgical History:  Procedure Laterality Date  . ANTERIOR CERVICAL DECOMP/DISCECTOMY FUSION N/A 12/13/2013   Procedure: ANTERIOR CERVICAL DECOMPRESSION/DISCECTOMY FUSION 3 LEVELS Cervical four/five,five/six,six/seven. Anterior cervical disectomy and fusion with peek and plate ;  Surgeon: [name], MD;  Location: [facility];  Service: Neurosurgery;  Laterality: N/A;  . CARDIAC CATHETERIZATION N/A 07/02/2015   Procedure: Left Heart Cath and Coronary Angiography;  Surgeon: [name], MD;  Location: [facility];  Service: Cardiovascular;  Laterality: N/A;  . CORONARY ANGIOPLASTY WITH STENT PLACEMENT  05/13/2012   DES to LAD; she has total RCA with left to rt coll. normal LV function done for positive nuc study  . DILATION AND CURETTAGE OF UTERUS  1960's; 1970's; 1980   "probably 3" (05/13/2012)  . FOREARM / WRIST TENDON LESION EXCISION  ?11/2001   "right; did waver to try to get ulnar out of hole that it had cut" (05/13/2012  . INGUINAL HERNIA REPAIR  ~ 1966; 07/21/2002   "right; left" (05/13/2012)   . LEFT HEART CATHETERIZATION WITH CORONARY ANGIOGRAM N/A 05/13/2012   Procedure: LEFT HEART CATHETERIZATION WITH CORONARY ANGIOGRAM;  Surgeon: [name], MD;  Location: [facility];  Service: Cardiovascular;  Laterality: N/A;  . OSTEOTOMY AND ULNAR SHORTENING  07/21/2002   "right" (05/13/2012)  . TONSILLECTOMY AND ADENOIDECTOMY  1950's  . TUBAL LIGATION  1980    Family History  Problem Relation Age of Onset  . Hypertension Mother   . Diabetes Mother   . Hyperlipidemia Father   . Heart disease Father   . Stroke Father   . Heart disease Sister   . Diabetes  Sister   . Heart disease Brother   . Diabetes Maternal Aunt     Social History:  reports that she quit smoking about 31 years ago. Her smoking use included cigarettes. She has a 10.00 pack-year smoking history. She has never used smokeless tobacco. She reports current alcohol use of about 12.0 standard drinks of alcohol per week. She reports that she does not use drugs.  Allergies:  Allergies  Allergen Reactions  . Statins Other (See Comments)    "intermittent loss of circulation; hands and arms will go to sleep; feet will get cramps; fatigue" (05/13/2012).  This occurred with Lipitor, Zocor, Crestor 5 mg qd, and she thinks pravastatin as well  . Amlodipine Itching  . Gadolinium Derivatives Itching and Nausea Only    Pt had severe nausea and itching on legs, neck and back, per dr [name] pt needs 13 hour prep before contrast in the future    Allergies as of 04/06/2020      Reactions   Statins Other (See Comments)   "intermittent loss of circulation; hands and arms will go to sleep; feet will get cramps; fatigue" (05/13/2012).  This occurred with Lipitor, Zocor, Crestor 5 mg qd, and she thinks pravastatin as well   Amlodipine Itching   Gadolinium Derivatives Itching, Nausea Only   Pt had severe nausea and itching on legs, neck and back, per dr [name] pt needs 13 hour prep before contrast in the future      Medication  List       Accurate as of April 06, 2020 10:50 AM. If you have any questions, ask your nurse or doctor.        acetaminophen 500 MG tablet Commonly known as: TYLENOL Take 500 mg by mouth every 6 (six) hours as needed for mild pain or headache.   aspirin 81 MG tablet Take 81 mg by mouth daily.   calcium-vitamin D 500-200 MG-UNIT tablet Commonly known as: OSCAL WITH D Take 2 tablets by mouth daily.   carvedilol 12.5 MG tablet Commonly known as: COREG Take 12.5 mg by mouth 2 (two) times daily with a meal.   chlorthalidone 25 MG tablet Commonly known as: HYGROTON Take 1 tablet (25 mg total) by mouth daily.   clopidogrel 75 MG tablet Commonly known as: PLAVIX TAKE 1 TABLET(75 MG) BY MOUTH DAILY   diclofenac sodium 1 % Gel Commonly known as: VOLTAREN Apply 2 g topically daily as needed. For pain   Dilt-XR 240 MG 24 hr capsule Generic drug: diltiazem Take 240 mg by mouth daily.   ezetimibe 10 MG tablet Commonly known as: ZETIA Take 0.5 tablets (5 mg total) by mouth daily.   fenofibrate 160 MG tablet Take 160 mg by mouth daily.  Pt takes 1/2 tablet   glucose blood test strip Commonly known as: OneTouch Verio Use to test blood sugar once daily Dx code E11.9   hydrocortisone valerate cream 0.2 % Commonly known as: WESTCORT Apply 1 application topically daily as needed (ITCHING).   irbesartan 300 MG tablet Commonly known as: AVAPRO Take 0.5 tablets (150 mg total) by mouth in the morning and at bedtime. TAKE 1 TABLET(300 MG) BY MOUTH DAILY   levothyroxine 25 MCG tablet Commonly known as: SYNTHROID TAKE 1 TABLET(25 MCG) BY MOUTH DAILY BEFORE BREAKFAST   metFORMIN 500 MG tablet Commonly known as: GLUCOPHAGE Take by mouth daily.   multivitamin with minerals Tabs tablet Take 1 tablet by mouth daily.   nitroGLYCERIN 0.4 MG SL tablet Commonly known as:  NITROSTAT PLACE 1 TABLET UNDER THE TONGUE EVERY 5 MINUTES AS NEEDED FOR CHEST PAIN   Repatha SureClick 536  MG/ML [place] Generic drug: Evolocumab Inject 1 pen into the skin every 14 (fourteen) days.   Vitamin D3 50 MCG (2000 UT) Tabs Take 2,000 Units by mouth 2 (two) times daily.       LABS:  Lab on 04/03/2020  Component Date Value Ref Range Status  . Sodium 04/03/2020 131* 135 - 145 mEq/L Final  . Potassium 04/03/2020 3.9  3.5 - 5.1 mEq/L Final  . Chloride 04/03/2020 97  96 - 112 mEq/L Final  . CO2 04/03/2020 28  19 - 32 mEq/L Final  . Glucose, Bld 04/03/2020 126* 70 - 99 mg/dL Final  . BUN 04/03/2020 17  6 - 23 mg/dL Final  . Creatinine, Ser 04/03/2020 0.68  0.40 - 1.20 mg/dL Final  . Total Bilirubin 04/03/2020 0.4  0.2 - 1.2 mg/dL Final  . Alkaline Phosphatase 04/03/2020 37* 39 - 117 U/L Final  . AST 04/03/2020 17  0 - 37 U/L Final  . ALT 04/03/2020 15  0 - 35 U/L Final  . Total Protein 04/03/2020 7.0  6.0 - 8.3 g/dL Final  . Albumin 04/03/2020 4.1  3.5 - 5.2 g/dL Final  . GFR 04/03/2020 84.15  >60.00 mL/min Final   Calculated using the CKD-EPI Creatinine Equation (2021)  . Calcium 04/03/2020 9.7  8.4 - 10.5 mg/dL Final  . Free T4 04/03/2020 1.13  0.60 - 1.60 ng/dL Final   Comment: Specimens from patients who are undergoing biotin therapy and /or ingesting biotin supplements may contain high levels of biotin.  The higher biotin concentration in these specimens interferes with this Free T4 assay.  Specimens that contain high levels  of biotin may cause false high results for this Free T4 assay.  Please interpret results in light of the total clinical presentation of the patient.    Marland Kitchen TSH 04/03/2020 4.47  0.35 - 4.50 uIU/mL Final  . Hgb A1c MFr Bld 04/03/2020 6.7* 4.6 - 6.5 % Final   Glycemic Control Guidelines for People with Diabetes:Non Diabetic:  <6%Goal of Therapy: <7%Additional Action Suggested:  >8%       Review of Systems    OSTEOPENIA:   Lowest T score is at the right neck femur previously -2.4  This is now only 0.8 as of 11/14/2019  However BMD at the left femoral  neck is last -2.0 compared to -1.8  Apparently had tried Fosamax in the past and this caused some GI side effects She received RECLAST infusion on 12/15/17 and again on 01/07/2020   HYPERTENSION: Treated with Avapro, chlorthalidone half tablet and Coreg, Followed by PCP. Home BP checks regularly also   History of hyperlipidemia with some intolerance to statins, on Repatha and also on Zetia.    Has high triglycerides, apparently previously having muscle aches and was told to stop fenofibrate with which her muscle aches are improved  Last fasting lipids as follows  Lab Results  Component Value Date   CHOL 154 01/02/2020   HDL 44 01/02/2020   LDLCALC 72 01/02/2020   LDLDIRECT 130.1 04/04/2014   TRIG 233 (H) 01/02/2020   CHOLHDL 2.8 10/14/2019      HYPOTHYROIDISM: TSH level previously has been as high as 5.6  This spontaneously improved but in 05/2018 was mildly increased again  At that time she was complaining of some fatigue, constipation, dry skin, some cold intolerance and difficulty losing weight.  She is taking  levothyroxine 25 mcg daily since she appeared to be symptomatic No complaints of unusual fatigue recently  Her TSH is consistently around 4  Lab Results  Component Value Date   TSH 4.47 04/03/2020   TSH 4.23 11/29/2019   TSH 4.00 07/15/2019   FREET4 1.13 04/03/2020   FREET4 1.17 11/29/2019   FREET4 1.14 07/15/2019     PHYSICAL EXAM:  BP 132/82   Pulse 68   Ht 5\' 2"  (1.575 m)   Wt 137 lb 12.8 oz (62.5 kg)   SpO2 97%   BMI 25.20 kg/m      ASSESSMENT:   Prediabetes with impaired fasting glucose and also impaired glucose tolerance  Her A1c is higher than usual at 6.7  She is on 1000 mg of Metformin ER recently  Although she had blood done well with keeping her weight down and doing well with her diet blood sugars are probably higher from inactivity and periodic steroids   HYPOTHYROIDISM: TSH is normal on treatment She may have been mildly  symptomatic before starting 25 mcg levothyroxine  OSTEOPENIA: She will be treated with Reclast every other year, next infusion will be in 8/23   Lipids: Followed in cardiology clinic, triglycerides high because of currently not taking fenofibrate  Mild hyponatremia related to chlorthalidone, asymptomatic   PLAN:   Continue 1000 mg Metformin Consistent diet To check blood sugars more consistently and let us know if they are higher Resume exercise when able to  Continue levothyroxine 25 mcg for mild hypothyroidism  Stay on vitamin D supplements and calcium for osteopenia  She will discuss her hyponatremia with PCP  Follow-up in  4 months     Solaris Kram 04/06/2020, 10:50 AM

## 2020-04-09 DIAGNOSIS — I10 Essential (primary) hypertension: Secondary | ICD-10-CM | POA: Diagnosis not present

## 2020-04-10 DIAGNOSIS — Z23 Encounter for immunization: Secondary | ICD-10-CM | POA: Diagnosis not present

## 2020-04-23 DIAGNOSIS — H353112 Nonexudative age-related macular degeneration, right eye, intermediate dry stage: Secondary | ICD-10-CM | POA: Diagnosis not present

## 2020-04-25 DIAGNOSIS — M19071 Primary osteoarthritis, right ankle and foot: Secondary | ICD-10-CM | POA: Diagnosis not present

## 2020-05-01 DIAGNOSIS — L570 Actinic keratosis: Secondary | ICD-10-CM | POA: Diagnosis not present

## 2020-05-01 DIAGNOSIS — L820 Inflamed seborrheic keratosis: Secondary | ICD-10-CM | POA: Diagnosis not present

## 2020-05-01 DIAGNOSIS — H2513 Age-related nuclear cataract, bilateral: Secondary | ICD-10-CM | POA: Diagnosis not present

## 2020-05-01 DIAGNOSIS — H04123 Dry eye syndrome of bilateral lacrimal glands: Secondary | ICD-10-CM | POA: Diagnosis not present

## 2020-05-01 DIAGNOSIS — L821 Other seborrheic keratosis: Secondary | ICD-10-CM | POA: Diagnosis not present

## 2020-05-01 DIAGNOSIS — L814 Other melanin hyperpigmentation: Secondary | ICD-10-CM | POA: Diagnosis not present

## 2020-05-01 DIAGNOSIS — H353132 Nonexudative age-related macular degeneration, bilateral, intermediate dry stage: Secondary | ICD-10-CM | POA: Diagnosis not present

## 2020-05-01 DIAGNOSIS — H25013 Cortical age-related cataract, bilateral: Secondary | ICD-10-CM | POA: Diagnosis not present

## 2020-05-04 ENCOUNTER — Telehealth: Payer: Self-pay

## 2020-05-04 ENCOUNTER — Other Ambulatory Visit: Payer: Self-pay | Admitting: Cardiovascular Disease

## 2020-05-04 DIAGNOSIS — E785 Hyperlipidemia, unspecified: Secondary | ICD-10-CM

## 2020-05-04 NOTE — Telephone Encounter (Signed)
Patient calling for Raquel inquiring about when she needs to come for lab work after recent change in Fenofibrate.  She can be reached [phone].

## 2020-05-07 DIAGNOSIS — E785 Hyperlipidemia, unspecified: Secondary | ICD-10-CM | POA: Diagnosis not present

## 2020-05-07 LAB — HEPATIC FUNCTION PANEL
ALT: 14 IU/L (ref 0–32)
AST: 17 IU/L (ref 0–40)
Albumin: 4.2 g/dL (ref 3.7–4.7)
Alkaline Phosphatase: 41 IU/L — ABNORMAL LOW (ref 44–121)
Bilirubin Total: 0.3 mg/dL (ref 0.0–1.2)
Bilirubin, Direct: <0.1 mg/dL (ref 0.00–0.40)
Total Protein: 6.7 g/dL (ref 6.0–8.5)

## 2020-05-07 LAB — LIPID PANEL
Chol/HDL Ratio: 3.1 ratio (ref 0.0–4.4)
Cholesterol, Total: 144 mg/dL (ref 100–199)
HDL: 46 mg/dL (ref >39)
LDL Chol Calc (NIH): 62 mg/dL (ref 0–99)
Triglycerides: 224 mg/dL — ABNORMAL HIGH (ref 0–149)
VLDL Cholesterol Cal: 36 mg/dL (ref 5–40)

## 2020-05-09 DIAGNOSIS — I10 Essential (primary) hypertension: Secondary | ICD-10-CM | POA: Diagnosis not present

## 2020-05-09 NOTE — Telephone Encounter (Signed)
Fenofibrate is 80mg  since she was unable to tolerate 160mg  she was having cramps. The pt just had fasting labs 9 weeks after dose change down to 80mg  completed on 05/07/20. Will route to raquel to make aware.

## 2020-05-09 NOTE — Addendum Note (Signed)
Addended by: Allean Found on: 05/09/2020 09:06 AM   Modules accepted: Orders

## 2020-05-09 NOTE — Telephone Encounter (Signed)
I am out of the office until tomorrow.   Please call patient, update for current fenofibrate dose, and order fasting blood blood (8 - 12 weeks after last dose change).  Send message to pharmD pool if patient has questions about her therapy. (LAST OV with me was for hypertension and not cholesterol medication adjustments were made).

## 2020-05-09 NOTE — Telephone Encounter (Signed)
Called and spoke w/pt regarding raquel's recommendation of remaining on the 80mg  fenofibrate and to repeat fasting labs in 3 mos. Labs ordered and mailed to pt. Pt voiced understanding

## 2020-05-09 NOTE — Telephone Encounter (Signed)
Labs already review by Dr Gwenlyn Found and released to patient via [place].  Continue all medication without changes, continue to follow diet low in simple carbohydrates (bread, pasta, sweets, etc.) and repeat fasting blood work in 3 months

## 2020-05-17 DIAGNOSIS — E782 Mixed hyperlipidemia: Secondary | ICD-10-CM | POA: Diagnosis not present

## 2020-05-17 DIAGNOSIS — I2510 Atherosclerotic heart disease of native coronary artery without angina pectoris: Secondary | ICD-10-CM | POA: Diagnosis not present

## 2020-05-17 DIAGNOSIS — I10 Essential (primary) hypertension: Secondary | ICD-10-CM | POA: Diagnosis not present

## 2020-05-22 ENCOUNTER — Other Ambulatory Visit: Payer: Self-pay | Admitting: Cardiovascular Disease

## 2020-05-22 ENCOUNTER — Other Ambulatory Visit: Payer: Self-pay | Admitting: Endocrinology

## 2020-05-23 DIAGNOSIS — H353112 Nonexudative age-related macular degeneration, right eye, intermediate dry stage: Secondary | ICD-10-CM | POA: Diagnosis not present

## 2020-05-24 ENCOUNTER — Telehealth: Payer: Self-pay | Admitting: Cardiovascular Disease

## 2020-05-24 ENCOUNTER — Other Ambulatory Visit: Payer: Self-pay | Admitting: Cardiovascular Disease

## 2020-05-24 DIAGNOSIS — M19071 Primary osteoarthritis, right ankle and foot: Secondary | ICD-10-CM | POA: Diagnosis not present

## 2020-05-24 DIAGNOSIS — E785 Hyperlipidemia, unspecified: Secondary | ICD-10-CM | POA: Diagnosis not present

## 2020-05-24 NOTE — Telephone Encounter (Signed)
  Brittany Figueroa ( pronounced Althea Charon),  From [name] saw the patient today. The patient had an Abnormal EKG and the Doctor at the [facility] wanted the patient to be seen ASAP. The patient has been scheduled to see [name] 05/29/20 at 11:15 am but the Doctor does not think that is soon enough.   Please contact [facility]

## 2020-05-24 NOTE — Telephone Encounter (Signed)
Left message on voicemail at medication management asking for them to please fax the EKG on this patient to attn dr Flora Lipps so it can be reviewed.

## 2020-05-25 DIAGNOSIS — R9431 Abnormal electrocardiogram [ECG] [EKG]: Secondary | ICD-10-CM | POA: Diagnosis not present

## 2020-05-25 DIAGNOSIS — I2510 Atherosclerotic heart disease of native coronary artery without angina pectoris: Secondary | ICD-10-CM | POA: Diagnosis not present

## 2020-05-25 LAB — HEPATIC FUNCTION PANEL
ALT: 15 IU/L (ref 0–32)
AST: 17 IU/L (ref 0–40)
Albumin: 4.4 g/dL (ref 3.7–4.7)
Alkaline Phosphatase: 46 IU/L (ref 44–121)
Bilirubin Total: 0.3 mg/dL (ref 0.0–1.2)
Bilirubin, Direct: <0.1 mg/dL (ref 0.00–0.40)
Total Protein: 7.4 g/dL (ref 6.0–8.5)

## 2020-05-25 LAB — LIPID PANEL
Chol/HDL Ratio: 3.8 ratio (ref 0.0–4.4)
Cholesterol, Total: 173 mg/dL (ref 100–199)
HDL: 46 mg/dL (ref >39)
LDL Chol Calc (NIH): 87 mg/dL (ref 0–99)
Triglycerides: 243 mg/dL — ABNORMAL HIGH (ref 0–149)
VLDL Cholesterol Cal: 40 mg/dL (ref 5–40)

## 2020-05-29 ENCOUNTER — Ambulatory Visit: Payer: Medicare Other | Admitting: Cardiology

## 2020-05-31 DIAGNOSIS — Z1152 Encounter for screening for COVID-19: Secondary | ICD-10-CM | POA: Diagnosis not present

## 2020-06-11 DIAGNOSIS — M19071 Primary osteoarthritis, right ankle and foot: Secondary | ICD-10-CM | POA: Diagnosis not present

## 2020-06-11 DIAGNOSIS — I10 Essential (primary) hypertension: Secondary | ICD-10-CM | POA: Diagnosis not present

## 2020-06-11 DIAGNOSIS — M21611 Bunion of right foot: Secondary | ICD-10-CM | POA: Diagnosis not present

## 2020-06-19 DIAGNOSIS — I2510 Atherosclerotic heart disease of native coronary artery without angina pectoris: Secondary | ICD-10-CM | POA: Diagnosis not present

## 2020-06-19 DIAGNOSIS — E782 Mixed hyperlipidemia: Secondary | ICD-10-CM | POA: Diagnosis not present

## 2020-06-19 DIAGNOSIS — I10 Essential (primary) hypertension: Secondary | ICD-10-CM | POA: Diagnosis not present

## 2020-06-21 ENCOUNTER — Other Ambulatory Visit: Payer: Self-pay | Admitting: Pharmacist Clinician (PhC)/ Clinical Pharmacy Specialist

## 2020-06-21 ENCOUNTER — Other Ambulatory Visit: Payer: Self-pay

## 2020-06-21 ENCOUNTER — Other Ambulatory Visit: Payer: Self-pay | Admitting: Cardiovascular Disease

## 2020-06-21 ENCOUNTER — Ambulatory Visit (INDEPENDENT_AMBULATORY_CARE_PROVIDER_SITE_OTHER): Payer: Medicare Other | Admitting: Pharmacist Clinician (PhC)/ Clinical Pharmacy Specialist

## 2020-06-21 VITALS — BP 134/72 | HR 78

## 2020-06-21 DIAGNOSIS — E785 Hyperlipidemia, unspecified: Secondary | ICD-10-CM

## 2020-06-21 MED ORDER — OMEGA-3-ACID ETHYL ESTERS 1 G PO CAPS: 2.0000 g | ORAL_CAPSULE | Freq: Two times a day (BID) | ORAL | 12 refills | Status: DC

## 2020-06-21 NOTE — Progress Notes (Signed)
Patient ID: Brittany Figueroa                 DOB: 26-Apr-1941                      MRN: [id number]     HPI: Brittany Figueroa is a 80 y.o. female referred by Dr. Gwenlyn Found for hyperlipidemia management. PMH also includes atypical angina, CAD, hypertension, and pre-diabetes.  We have been working with her for some time, both for lipids and hypertension.  At her last visit in November with Raquel Carrolyn Leigh her pressure was good at 130/78.  She was having trouble with low energy, so carvedilol dose was adjusted from 9.375 bid to 6.25 qam and 12.5 mg qhs.  She was also encouraged to increase physical activity.    Today she is here because of concerns for her cholesterol levels.  She currently takes Repatha 140 mg q14 days as well as ezetimibe 5 mg.  She also takes 1/2 of 160 mg fenofibrate tablet each day to help lower triglycerides.  Her most recent lipid pane shows last May her trigs were at 171 and LDL at 3.  Despite no medication changes her most recent LDL last month showed increases to 243 and 87 respectively.  This puts her LDL above goal of <70.  Patient states that she has not done anything different and has not missed medication doses.    wal greens  Current Lipid meds: Repatha SureClick 355 mg D32K Ezetimibe 5 mg daily  Current HTN meds:  Carvedilol 12.5mg  bid chlorthalidone 12.5mg  daily Diltiazem XR 240mg  daily Irbesartan 150mg  twice daily  Previously tried:  Amlodipine - itching Chlorthalidone - urinary incontinence  BP goal: 130/80  Family History: mother and multiple siblings with hyperlipidemia, hypertension; knows little of father's family  Social History: former smoker, 12 glasses of wine per week  Diet: uses some salt, but only on specific items; lean meats, vegetables; no fried foods, low carbohydrates  Home BP readings:  14 readings; average 134/74 ; HR range 65-87bpm  173  243  46  87  Wt Readings from Last 3 Encounters:  04/06/20 137 lb 12.8 oz (62.5 kg)  04/03/20  132 lb (59.9 kg)  02/28/20 133 lb (60.3 kg)   BP Readings from Last 3 Encounters:  06/21/20 134/72  04/06/20 132/82  04/03/20 130/78   Pulse Readings from Last 3 Encounters:  06/21/20 78  04/06/20 68  04/03/20 76    Past Medical History:  Diagnosis Date  . Abnormal finding on EKG, new anterolateral T-wave inversions  05/14/2012  . Abnormal nuclear stress test, with infero-lateral ischemia 05/14/2012  . Arthritis    "right thumb; all my fingers" (05/13/2012), cervical spondylosis   . Atypical angina ([place]) 05/14/2012  . CAD (coronary artery disease), 05/13/12, with 80% LAD 05/14/2012  . Cancer ([place])    basal cell on facial in L temporal region    . CMC arthritis, thumb, degenerative   . Complication of anesthesia 12/2009   "had endoscopy; larynx went into spasms; stopped breathing for 15 seconds" (05/13/2012)  . GERD (gastroesophageal reflux disease) 2011  . Hypercholesteremia   . Hypertension   . S/P angioplasty with stent, LAD 05/13/12 05/14/2012  . Tuberculosis    test +- [place] [facility] - Dr. [name]- CXR- OK    Current Outpatient Medications on File Prior to Visit  Medication Sig Dispense Refill  . acetaminophen (TYLENOL) 500 MG tablet Take 500 mg by mouth every 6 (  six) hours as needed for mild pain or headache.    Marland Kitchen aspirin 81 MG tablet Take 81 mg by mouth daily.    . calcium-vitamin D (OSCAL WITH D) 500-200 MG-UNIT per tablet Take 1 tablet by mouth daily as needed.    . carvedilol (COREG) 12.5 MG tablet Take 12.5 mg by mouth 2 (two) times daily with a meal.    . Cholecalciferol (VITAMIN D3) 2000 UNITS TABS Take 2,000 Units by mouth 2 (two) times daily.    . clopidogrel (PLAVIX) 75 MG tablet TAKE 1 TABLET(75 MG) BY MOUTH DAILY 90 tablet 2  . diclofenac sodium (VOLTAREN) 1 % GEL Apply 2 g topically daily as needed. For pain    . DILT-XR 240 MG 24 hr capsule Take 240 mg by mouth daily.    Marland Kitchen ezetimibe (ZETIA) 10 MG tablet Take 0.5 tablets (5 mg total) by mouth daily. 90 tablet  2  . fenofibrate 160 MG tablet TAKE 1 TABLET(160 MG) BY MOUTH DAILY (Patient taking differently: Take 80 mg by mouth daily.) 90 tablet 3  . glucose blood (ONETOUCH VERIO) test strip Use to test blood sugar once daily Dx code E11.9 30 each 4  . hydrocortisone valerate cream (WESTCORT) 0.2 % Apply 1 application topically daily as needed (ITCHING).    Marland Kitchen irbesartan (AVAPRO) 300 MG tablet Take 0.5 tablets (150 mg total) by mouth in the morning and at bedtime. TAKE 1 TABLET(300 MG) BY MOUTH DAILY 90 tablet 3  . levothyroxine (SYNTHROID) 25 MCG tablet TAKE 1 TABLET(25 MCG) BY MOUTH DAILY BEFORE BREAKFAST 90 tablet 1  . metFORMIN (GLUCOPHAGE) 500 MG tablet Take 1,000 mg by mouth daily with breakfast.    . Multiple Vitamin (MULTIVITAMIN WITH MINERALS) TABS Take 1 tablet by mouth daily.    . Multiple Vitamins-Minerals (PRESERVISION AREDS 2+MULTI VIT PO) Take 1 tablet by mouth in the morning and at bedtime.    . nitroGLYCERIN (NITROSTAT) 0.4 MG SL tablet PLACE 1 TABLET UNDER THE TONGUE EVERY 5 MINUTES AS NEEDED FOR CHEST PAIN 25 tablet 1  . triamterene-hydrochlorothiazide (MAXZIDE-25) 37.5-25 MG tablet Take 1 tablet by mouth daily. Patient taking  1/4 tablet     No current facility-administered medications on file prior to visit.    Allergies  Allergen Reactions  . Statins Other (See Comments)    "intermittent loss of circulation; hands and arms will go to sleep; feet will get cramps; fatigue" (05/13/2012).  This occurred with Lipitor, Zocor, Crestor 5 mg qd, and she thinks pravastatin as well  . Amlodipine Itching  . Gadolinium Derivatives Itching and Nausea Only    Pt had severe nausea and itching on legs, neck and back, per dr [name] pt needs 13 hour prep before contrast in the future    Blood pressure 134/72, pulse 78.  Dyslipidemia Patient with LDL above goal despite ezetimibe 5 mg an Repatha 140 mg.  Will have patient increase ezetimibe to 10 mg daily and continue with Repatha.  For her  triglycerides we will add omega-3 fish oil 2 gm bid.  Because of cost concerns she would prefer to start with OTC fish oil.  Explained that it is not as potent, but if she is using this, to be sure getting a brand with USP seal.  She does have [place] assistance for the Repatha, but that will not cover the generic omega 3 oils, only brand Vascepa.  If she were to do that she would run out of funds and potentially then not have  either medication.  She will also work on getting her diet back to what it was before the holiday season came along.  Will have her repeat lipid labs in 2 months to see if back to her goal readings.  If not at goal will revisit trying to use prescription strength.    Tommy Medal PharmD CPP [facility] [address] [place], [id number] 06/22/2020 1:43 PM

## 2020-06-21 NOTE — Patient Instructions (Addendum)
Your Results:             Your most recent labs Goal  Total Cholesterol 173 < 200  Triglycerides 243 < 150  HDL (happy/good cholesterol) 46 > 40  LDL (lousy/bad cholesterol 87 < 70      Medication changes:  Increase ezetimibe to 10 mg (whole tablet).   Start omega-3 fish oil - 2 capsules twice daily.  (I'll contact you once Grandville Silos is able to tell us how much is available on your [employer] grant).  You can tell the pharmacy to use the information from your Repatha prescription to pay the copay on your fish oil)  Lab orders:  Repeat cholesterol labs in 2 months - first half of April.    Patient Assistance:  The [employer] Well foundation offers assistance to help pay for medication copays.  They will cover copays for all cholesterol lowering meds, including statins, fibrates, omega-3 oils, ezetimibe, Repatha, Praluent, Nexletol, Nexlizet.  The cards are usually good for $2,500 or 12 months, whichever comes first. 1. Go to healthwellfoundation.org 2. Click on "Apply Now" 3. Answer questions as to whom is applying (patient or representative) 4. Your disease fund will be "hypercholesterolemia - Medicare access" 5. They will ask questions about finances and which medications you are taking for cholesterol 6. When you submit, the approval is usually within minutes.  You will need to print the card information from the site 7. You will need to show this information to your pharmacy, they will bill your Medicare Part D plan first -then bill [employer] Well --for the copay.   You can also call them at [phone], although the hold times can be quite long.   Thank you for choosing [facility]

## 2020-06-22 ENCOUNTER — Encounter: Payer: Self-pay | Admitting: Pharmacist Clinician (PhC)/ Clinical Pharmacy Specialist

## 2020-06-22 DIAGNOSIS — H353112 Nonexudative age-related macular degeneration, right eye, intermediate dry stage: Secondary | ICD-10-CM | POA: Diagnosis not present

## 2020-06-22 NOTE — Assessment & Plan Note (Addendum)
Patient with LDL above goal despite ezetimibe 5 mg an Repatha 140 mg.  Will have patient increase ezetimibe to 10 mg daily and continue with Repatha.  For her triglycerides we will add omega-3 fish oil 2 gm bid.  Because of cost concerns she would prefer to start with OTC fish oil.  Explained that it is not as potent, but if she is using this, to be sure getting a brand with USP seal.  She does have [place] assistance for the Repatha, but that will not cover the generic omega 3 oils, only brand Vascepa.  If she were to do that she would run out of funds and potentially then not have either medication.  She will also work on getting her diet back to what it was before the holiday season came along.  Will have her repeat lipid labs in 2 months to see if back to her goal readings.  If not at goal will revisit trying to use prescription strength.

## 2020-06-28 DIAGNOSIS — H353211 Exudative age-related macular degeneration, right eye, with active choroidal neovascularization: Secondary | ICD-10-CM | POA: Diagnosis not present

## 2020-06-28 DIAGNOSIS — H353122 Nonexudative age-related macular degeneration, left eye, intermediate dry stage: Secondary | ICD-10-CM | POA: Diagnosis not present

## 2020-07-04 ENCOUNTER — Other Ambulatory Visit: Payer: Self-pay

## 2020-07-04 ENCOUNTER — Ambulatory Visit (INDEPENDENT_AMBULATORY_CARE_PROVIDER_SITE_OTHER): Payer: Medicare Other | Admitting: Ophthalmology

## 2020-07-04 ENCOUNTER — Encounter (INDEPENDENT_AMBULATORY_CARE_PROVIDER_SITE_OTHER): Payer: Self-pay | Admitting: Ophthalmology

## 2020-07-04 DIAGNOSIS — H353132 Nonexudative age-related macular degeneration, bilateral, intermediate dry stage: Secondary | ICD-10-CM | POA: Diagnosis not present

## 2020-07-04 DIAGNOSIS — H353211 Exudative age-related macular degeneration, right eye, with active choroidal neovascularization: Secondary | ICD-10-CM

## 2020-07-04 MED ORDER — BEVACIZUMAB 2.5 MG/0.1ML IZ SOSY
2.5000 mg | PREFILLED_SYRINGE | INTRAVITREAL | Status: AC | PRN
Start: 2020-07-04 — End: 2020-07-04
  Administered 2020-07-04: 2.5 mg via INTRAVITREAL

## 2020-07-04 NOTE — Progress Notes (Signed)
07/04/2020     CHIEF COMPLAINT Patient presents for Retina Evaluation (NP ARMD OU, REF'd by Dr. Kathlen Mody (pt asked a report of be sent to Dr. Sharren Bridge, Felipa Eth, Kathlen Mody, and Kumar)////Pt reports vision blurry OD>OS. Pt denies any F/F, pain or pressure OU. ////Last A1C: 6.8 taken 03/2020///Last BS: 95 taken 2 days ago, )   HISTORY OF PRESENT ILLNESS: Brittany Figueroa is a 80 y.o. female who presents to the clinic today for:   HPI    Retina Evaluation    In both eyes.  This started 5 days ago.  Duration of 5 days.  Context:  distance vision. Additional comments: NP ARMD OU, REF'd by Dr. Kathlen Mody (pt asked a report of be sent to Dr. Sharren Bridge, Felipa Eth, Kathlen Mody, and [place])    Pt reports vision blurry OD>OS. Pt denies any F/F, pain or pressure OU.     Last A1C: 6.8 taken 03/2020   Last BS: 95 taken 2 days ago,        Last edited by Nichola Sizer D on 07/04/2020  8:38 AM. (History)      Referring physician: Lajean Manes, MD [address] [place],  [place] [address]  HISTORICAL INFORMATION:   Selected notes from the MEDICAL RECORD NUMBER    Lab Results  Component Value Date   HGBA1C 6.7 (H) 04/03/2020     CURRENT MEDICATIONS: No current outpatient medications on file. (Ophthalmic Drugs)   No current facility-administered medications for this visit. (Ophthalmic Drugs)   Current Outpatient Medications (Other)  Medication Sig  . acetaminophen (TYLENOL) 500 MG tablet Take 500 mg by mouth every 6 (six) hours as needed for mild pain or headache.  Marland Kitchen aspirin 81 MG tablet Take 81 mg by mouth daily.  . calcium-vitamin D (OSCAL WITH D) 500-200 MG-UNIT per tablet Take 1 tablet by mouth daily as needed.  . carvedilol (COREG) 12.5 MG tablet Take 12.5 mg by mouth 2 (two) times daily with a meal.  . Cholecalciferol (VITAMIN D3) 2000 UNITS TABS Take 2,000 Units by mouth 2 (two) times daily.  . clopidogrel (PLAVIX) 75 MG tablet TAKE 1 TABLET(75 MG) BY MOUTH DAILY  .  diclofenac sodium (VOLTAREN) 1 % GEL Apply 2 g topically daily as needed. For pain  . DILT-XR 240 MG 24 hr capsule Take 240 mg by mouth daily.  Marland Kitchen ezetimibe (ZETIA) 10 MG tablet Take 0.5 tablets (5 mg total) by mouth daily.  . fenofibrate 160 MG tablet TAKE 1 TABLET(160 MG) BY MOUTH DAILY (Patient taking differently: Take 80 mg by mouth daily.)  . glucose blood (ONETOUCH VERIO) test strip Use to test blood sugar once daily Dx code E11.9  . hydrocortisone valerate cream (WESTCORT) 0.2 % Apply 1 application topically daily as needed (ITCHING).  Marland Kitchen irbesartan (AVAPRO) 300 MG tablet Take 0.5 tablets (150 mg total) by mouth in the morning and at bedtime. TAKE 1 TABLET(300 MG) BY MOUTH DAILY  . levothyroxine (SYNTHROID) 25 MCG tablet TAKE 1 TABLET(25 MCG) BY MOUTH DAILY BEFORE BREAKFAST  . metFORMIN (GLUCOPHAGE) 500 MG tablet Take 1,000 mg by mouth daily with breakfast.  . Multiple Vitamin (MULTIVITAMIN WITH MINERALS) TABS Take 1 tablet by mouth daily.  . Multiple Vitamins-Minerals (PRESERVISION AREDS 2+MULTI VIT PO) Take 1 tablet by mouth in the morning and at bedtime.  . nitroGLYCERIN (NITROSTAT) 0.4 MG SL tablet PLACE 1 TABLET UNDER THE TONGUE EVERY 5 MINUTES AS NEEDED FOR CHEST PAIN  . omega-3 acid ethyl esters (LOVAZA) 1 g capsule  Take 2 capsules (2 g total) by mouth 2 (two) times daily.  Marland Kitchen REPATHA SURECLICK 562 MG/ML SOAJ INJECT 1 PEN UNDER THE SKIN EVERY 14 DAYS  . triamterene-hydrochlorothiazide (MAXZIDE-25) 37.5-25 MG tablet Take 1 tablet by mouth daily. Patient taking  1/4 tablet   No current facility-administered medications for this visit. (Other)      REVIEW OF SYSTEMS:    ALLERGIES Allergies  Allergen Reactions  . Statins Other (See Comments)    "intermittent loss of circulation; hands and arms will go to sleep; feet will get cramps; fatigue" (05/13/2012).  This occurred with Lipitor, Zocor, Crestor 5 mg qd, and she thinks pravastatin as well  . Amlodipine Itching  . Gadolinium  Derivatives Itching and Nausea Only    Pt had severe nausea and itching on legs, neck and back, per dr [name] pt needs 13 hour prep before contrast in the future    PAST MEDICAL HISTORY Past Medical History:  Diagnosis Date  . Abnormal finding on EKG, new anterolateral T-wave inversions  05/14/2012  . Abnormal nuclear stress test, with infero-lateral ischemia 05/14/2012  . Arthritis    "right thumb; all my fingers" (05/13/2012), cervical spondylosis   . Atypical angina ([place]) 05/14/2012  . CAD (coronary artery disease), 05/13/12, with 80% LAD 05/14/2012  . Cancer ([place])    basal cell on facial in L temporal region    . CMC arthritis, thumb, degenerative   . Complication of anesthesia 12/2009   "had endoscopy; larynx went into spasms; stopped breathing for 15 seconds" (05/13/2012)  . GERD (gastroesophageal reflux disease) 2011  . Hypercholesteremia   . Hypertension   . S/P angioplasty with stent, LAD 05/13/12 05/14/2012  . Tuberculosis    test +- [facility] [facility] - Dr. [name]- CXR- OK   Past Surgical History:  Procedure Laterality Date  . ANTERIOR CERVICAL DECOMP/DISCECTOMY FUSION N/A 12/13/2013   Procedure: ANTERIOR CERVICAL DECOMPRESSION/DISCECTOMY FUSION 3 LEVELS Cervical four/five,five/six,six/seven. Anterior cervical disectomy and fusion with peek and plate ;  Surgeon: [name], MD;  Location: [facility];  Service: Neurosurgery;  Laterality: N/A;  . CARDIAC CATHETERIZATION N/A 07/02/2015   Procedure: Left Heart Cath and Coronary Angiography;  Surgeon: [name], MD;  Location: [facility];  Service: Cardiovascular;  Laterality: N/A;  . CORONARY ANGIOPLASTY WITH STENT PLACEMENT  05/13/2012   DES to LAD; she has total RCA with left to rt coll. normal LV function done for positive nuc study  . DILATION AND CURETTAGE OF UTERUS  1960's; 1970's; 1980   "probably 3" (05/13/2012)  . FOREARM / WRIST TENDON LESION EXCISION  ?11/2001   "right; did waver to try to get ulnar out  of hole that it had cut" (05/13/2012  . INGUINAL HERNIA REPAIR  ~ 1966; 07/21/2002   "right; left" (05/13/2012)  . LEFT HEART CATHETERIZATION WITH CORONARY ANGIOGRAM N/A 05/13/2012   Procedure: LEFT HEART CATHETERIZATION WITH CORONARY ANGIOGRAM;  Surgeon: [name], MD;  Location: [facility];  Service: Cardiovascular;  Laterality: N/A;  . OSTEOTOMY AND ULNAR SHORTENING  07/21/2002   "right" (05/13/2012)  . TONSILLECTOMY AND ADENOIDECTOMY  1950's  . TUBAL LIGATION  1980    FAMILY HISTORY Family History  Problem Relation Age of Onset  . Hypertension Mother   . Diabetes Mother   . Hyperlipidemia Father   . Heart disease Father   . Stroke Father   . Heart disease Sister   . Diabetes Sister   . Heart disease Brother   .  Diabetes Maternal Aunt     SOCIAL HISTORY Social History   Tobacco Use  . Smoking status: Former Smoker    Packs/day: 0.50    Years: 20.00    Pack years: 10.00    Types: Cigarettes    Quit date: 07/05/1988    Years since quitting: 32.0  . Smokeless tobacco: Never Used  Substance Use Topics  . Alcohol use: Yes    Alcohol/week: 12.0 standard drinks    Types: 12 Glasses of wine per week    Comment: 05/13/2012 "6-8 oz glass of red wine q hs"   . Drug use: No         OPHTHALMIC EXAM: Base Eye Exam    Visual Acuity (ETDRS)      Right Left   Dist cc 20/30 -2 20/20 -1   Dist ph cc NI    Correction: Glasses       Tonometry (Tonopen, 8:42 AM)      Right Left   Pressure 18 16       Pupils      Pupils Dark Light Shape React APD   Right PERRL 4 3 Round Brisk None   Left PERRL 4 3 Round Brisk None       Visual Fields (Counting fingers)      Left Right    Full Full       Extraocular Movement      Right Left    Full Full       Neuro/Psych    Oriented x3: Yes       Dilation    Both eyes: 1.0% Mydriacyl, 2.5% Phenylephrine @ 8:42 AM        Slit Lamp and Fundus Exam    External Exam      Right Left   External Normal Normal        Slit Lamp Exam      Right Left   Lids/Lashes Normal Normal   Conjunctiva/Sclera White and quiet White and quiet   Cornea Clear Clear   Anterior Chamber Deep and quiet Deep and quiet   Iris Round and reactive Round and reactive   Lens 2+ Nuclear sclerosis 2+ Nuclear sclerosis   Anterior Vitreous Normal Normal       Fundus Exam      Right Left   Posterior Vitreous Normal Normal   Disc Normal Normal   C/D Ratio 0.45 0.45   Macula Hard drusen, Microaneurysms, Macular thickening Normal   Vessels Normal Normal   Periphery Normal Normal          IMAGING AND PROCEDURES  Imaging and Procedures for 07/04/20  OCT, Retina - OU - Both Eyes       Right Eye Quality was good. Scan locations included subfoveal. Central Foveal Thickness: 347. Progression has no prior data. Findings include retinal drusen , cystoid macular edema, intraretinal fluid, subretinal fluid.   Left Eye Quality was good. Scan locations included subfoveal. Central Foveal Thickness: 281. Progression has no prior data. Findings include retinal drusen .   Notes New subretinal fluid adjacent to and under the fovea of the right eye.                ASSESSMENT/PLAN:  Intermediate stage nonexudative age-related macular degeneration of both eyes The nature of age-related macular degeneration was discussed with the patient as well as the distinction between dry and wet types. Checking an Amsler Grid daily with advice to return immediately should a distortion develop, was given to  the patient. The patient 's smoking status now and in the past was determined and advice based on the AREDS study was provided regarding the consumption of antioxidant supplements. AREDS 2 vitamin formulation was recommended. Consumption of dark leafy vegetables and fresh fruits of various colors was recommended. Treatment modalities for wet macular degeneration particularly the use of intravitreal injections of anti-blood vessel growth factors  was discussed with the patient. Avastin, Lucentis, and Eylea are the available options. On occasion, therapy includes the use of photodynamic therapy and thermal laser. Stressed to the patient do not rub eyes.  Patient was advised to check Amsler Grid daily and return immediately if changes are noted. Instructions on using the grid were given to the patient. All patient questions were answered.      ICD-10-CM   1. Exudative age-related macular degeneration of right eye with active choroidal neovascularization (HCC)  H35.3211 OCT, Retina - OU - Both Eyes  2. Intermediate stage nonexudative age-related macular degeneration of both eyes  H35.3132     1.  Risk and benefits of treatment were reviewed with the patient.  Commencement of therapy on the right eye with intravitreal Avastin will begin today and follow-up again in 5 weeks  2.  Patient will continue on AREDS 2 vitamins daily.  3.  Ophthalmic Meds Ordered this visit:  No orders of the defined types were placed in this encounter.      Return in about 5 weeks (around 08/08/2020) for dilate, OD, AVASTIN OCT.  There are no Patient Instructions on file for this visit.   Explained the diagnoses, plan, and follow up with the patient and they expressed understanding.  Patient expressed understanding of the importance of proper follow up care.   Clent Demark Rankin M.D. Diseases & Surgery of the Retina and Vitreous [facility] [place] 07/04/20     Abbreviations: M myopia (nearsighted); A astigmatism; H hyperopia (farsighted); P presbyopia; Mrx spectacle prescription;  CTL contact lenses; OD right eye; OS left eye; OU both eyes  XT exotropia; ET esotropia; PEK punctate epithelial keratitis; PEE punctate epithelial erosions; DES dry eye syndrome; MGD meibomian gland dysfunction; ATs artificial tears; PFAT's preservative free artificial tears; [place] nuclear sclerotic cataract; PSC posterior subcapsular cataract; ERM epi-retinal membrane;  PVD posterior vitreous detachment; RD retinal detachment; DM diabetes mellitus; DR diabetic retinopathy; NPDR non-proliferative diabetic retinopathy; PDR proliferative diabetic retinopathy; CSME clinically significant macular edema; DME diabetic macular edema; dbh dot blot hemorrhages; CWS cotton wool spot; POAG primary open angle glaucoma; C/D cup-to-disc ratio; HVF humphrey visual field; GVF goldmann visual field; OCT optical coherence tomography; IOP intraocular pressure; BRVO Branch retinal vein occlusion; CRVO central retinal vein occlusion; CRAO central retinal artery occlusion; BRAO branch retinal artery occlusion; RT retinal tear; SB scleral buckle; PPV pars plana vitrectomy; VH Vitreous hemorrhage; PRP panretinal laser photocoagulation; IVK intravitreal kenalog; VMT vitreomacular traction; MH Macular hole;  NVD neovascularization of the disc; NVE neovascularization elsewhere; AREDS age related eye disease study; ARMD age related macular degeneration; POAG primary open angle glaucoma; EBMD epithelial/anterior basement membrane dystrophy; ACIOL anterior chamber intraocular lens; IOL intraocular lens; PCIOL posterior chamber intraocular lens; Phaco/IOL phacoemulsification with intraocular lens placement; [place] photorefractive keratectomy; LASIK laser assisted in situ keratomileusis; HTN hypertension; DM diabetes mellitus; COPD chronic obstructive pulmonary disease

## 2020-07-04 NOTE — Assessment & Plan Note (Signed)

## 2020-07-05 DIAGNOSIS — L84 Corns and callosities: Secondary | ICD-10-CM | POA: Diagnosis not present

## 2020-07-05 DIAGNOSIS — D225 Melanocytic nevi of trunk: Secondary | ICD-10-CM | POA: Diagnosis not present

## 2020-07-05 DIAGNOSIS — L820 Inflamed seborrheic keratosis: Secondary | ICD-10-CM | POA: Diagnosis not present

## 2020-07-05 DIAGNOSIS — L905 Scar conditions and fibrosis of skin: Secondary | ICD-10-CM | POA: Diagnosis not present

## 2020-07-05 DIAGNOSIS — Z85828 Personal history of other malignant neoplasm of skin: Secondary | ICD-10-CM | POA: Diagnosis not present

## 2020-07-05 DIAGNOSIS — L814 Other melanin hyperpigmentation: Secondary | ICD-10-CM | POA: Diagnosis not present

## 2020-07-05 DIAGNOSIS — L821 Other seborrheic keratosis: Secondary | ICD-10-CM | POA: Diagnosis not present

## 2020-07-17 ENCOUNTER — Other Ambulatory Visit (INDEPENDENT_AMBULATORY_CARE_PROVIDER_SITE_OTHER): Payer: Medicare Other

## 2020-07-17 ENCOUNTER — Other Ambulatory Visit: Payer: Self-pay

## 2020-07-17 DIAGNOSIS — R7303 Prediabetes: Secondary | ICD-10-CM

## 2020-07-17 DIAGNOSIS — I10 Essential (primary) hypertension: Secondary | ICD-10-CM | POA: Diagnosis not present

## 2020-07-17 DIAGNOSIS — E871 Hypo-osmolality and hyponatremia: Secondary | ICD-10-CM

## 2020-07-17 DIAGNOSIS — E559 Vitamin D deficiency, unspecified: Secondary | ICD-10-CM | POA: Diagnosis not present

## 2020-07-17 DIAGNOSIS — E039 Hypothyroidism, unspecified: Secondary | ICD-10-CM

## 2020-07-17 LAB — COMPREHENSIVE METABOLIC PANEL
ALT: 15 U/L (ref 0–35)
AST: 16 U/L (ref 0–37)
Albumin: 4.1 g/dL (ref 3.5–5.2)
Alkaline Phosphatase: 44 U/L (ref 39–117)
BUN: 25 mg/dL — ABNORMAL HIGH (ref 6–23)
CO2: 24 mEq/L (ref 19–32)
Calcium: 10 mg/dL (ref 8.4–10.5)
Chloride: 99 mEq/L (ref 96–112)
Creatinine, Ser: 0.72 mg/dL (ref 0.40–1.20)
GFR: 80.62 mL/min (ref >60.00)
Glucose, Bld: 118 mg/dL — ABNORMAL HIGH (ref 70–99)
Potassium: 4.2 mEq/L (ref 3.5–5.1)
Sodium: 130 mEq/L — ABNORMAL LOW (ref 135–145)
Total Bilirubin: 0.3 mg/dL (ref 0.2–1.2)
Total Protein: 7.2 g/dL (ref 6.0–8.3)

## 2020-07-17 LAB — VITAMIN D 25 HYDROXY (VIT D DEFICIENCY, FRACTURES): VITD: 42.51 ng/mL (ref 30.00–100.00)

## 2020-07-17 LAB — MICROALBUMIN / CREATININE URINE RATIO
Creatinine,U: 54.5 mg/dL
Microalb Creat Ratio: 1.3 mg/g (ref 0.0–30.0)
Microalb, Ur: <0.7 mg/dL (ref 0.0–1.9)

## 2020-07-17 LAB — HEMOGLOBIN A1C: Hgb A1c MFr Bld: 6 % (ref 4.6–6.5)

## 2020-07-17 LAB — URINALYSIS, ROUTINE W REFLEX MICROSCOPIC
Bilirubin Urine: NEGATIVE
Hgb urine dipstick: NEGATIVE
Ketones, ur: NEGATIVE
Nitrite: NEGATIVE
RBC / HPF: NONE SEEN (ref 0–?)
Specific Gravity, Urine: 1.015 (ref 1.000–1.030)
Total Protein, Urine: NEGATIVE
Urine Glucose: NEGATIVE
Urobilinogen, UA: 0.2 (ref 0.0–1.0)
pH: 7 (ref 5.0–8.0)

## 2020-07-17 LAB — TSH: TSH: 4.92 u[IU]/mL — ABNORMAL HIGH (ref 0.35–4.50)

## 2020-07-17 LAB — T4, FREE: Free T4: 1.08 ng/dL (ref 0.60–1.60)

## 2020-07-20 ENCOUNTER — Encounter: Payer: Self-pay | Admitting: Endocrinology

## 2020-07-20 ENCOUNTER — Ambulatory Visit (INDEPENDENT_AMBULATORY_CARE_PROVIDER_SITE_OTHER): Payer: Medicare Other | Admitting: Endocrinology

## 2020-07-20 ENCOUNTER — Other Ambulatory Visit: Payer: Self-pay

## 2020-07-20 VITALS — BP 140/82 | HR 72 | Ht 61.0 in | Wt 140.0 lb

## 2020-07-20 DIAGNOSIS — E871 Hypo-osmolality and hyponatremia: Secondary | ICD-10-CM | POA: Diagnosis not present

## 2020-07-20 DIAGNOSIS — R7303 Prediabetes: Secondary | ICD-10-CM | POA: Diagnosis not present

## 2020-07-20 DIAGNOSIS — E039 Hypothyroidism, unspecified: Secondary | ICD-10-CM

## 2020-07-20 DIAGNOSIS — E559 Vitamin D deficiency, unspecified: Secondary | ICD-10-CM | POA: Diagnosis not present

## 2020-07-20 NOTE — Progress Notes (Signed)
Patient ID: Brittany Figueroa, female   DOB: 12/20/1940, 80 y.o.   MRN: [id number]           Chief complaint: Endocrinology follow-up    History of Present Illness:  PREVIOUS history: She may have been told about 10 years ago that she had prediabetes and reportedly had a normal glucose tolerance test. She was told by her PCP that she has type 2 diabetes based on an A1c of 6.5% in 07/2013 She has not had any other A1c is subsequently above normal, normal range and previous lab 6.2 or less She also has not had any abnormal blood sugars except a glucose of 125 in 3/16  Has history of increased fasting blood sugars ranging from 108-125 since about 2012  GLUCOSE TOLERANCE test on 05/23/15: Fasting glucose 110, two-hour glucose 192  RECENT history:   She has impaired fasting glucose and glucose intolerance  She has been intermittently on metformin ER since about 08/2015 In 1/18 when A1c was 6.5 and glucose 133 fasting she was told to start back on metformin and also improve her diet She was taking 1500 mg a day but could not tolerate this because of feeling of bloating  Current regimen: Metformin ER 500 mg 2 tablets a day  A1c is 6.0, previously 6.7   Current management and history     She is appearing to have some weight gain, likely from inadequate exercise and diet  Previously had higher sugars overall because of intra-articular steroid injections  Most of her sugars in the mornings are excellent recently  Her meter has the wrong time set   Lowest A1c last year was 5.9  Has gained some weight and diet sometimes is inconsistent  Lab fasting glucose was 118  Tolerating Metformin 500 twice daily  Recent average blood sugar at home 104 as before FASTING range 95-118 Using freestyle neo monitor   Weight history: Previous range 139-143  Wt Readings from Last 3 Encounters:  07/20/20 140 lb (63.5 kg)  04/06/20 137 lb 12.8 oz (62.5 kg)  04/03/20 132 lb (59.9 kg)    Lab  Results  Component Value Date   HGBA1C 6.0 07/17/2020   HGBA1C 6.7 (H) 04/03/2020   HGBA1C 5.9 11/29/2019   Lab Results  Component Value Date   MICROALBUR <0.7 07/17/2020   LDLCALC 87 05/24/2020   CREATININE 0.72 07/17/2020   HYPOTHYROIDISM and other problems reviewed today: See review of systems    Lab on 07/17/2020  Component Date Value Ref Range Status  . Color, Urine 07/17/2020 YELLOW  Yellow;Lt. Yellow;Straw;Dark Yellow;Amber;Green;Red;Brown Final  . APPearance 07/17/2020 CLEAR  Clear;Turbid;Slightly Cloudy;Cloudy Final  . Specific Gravity, Urine 07/17/2020 1.015  1.000 - 1.030 Final  . pH 07/17/2020 7.0  5.0 - 8.0 Final  . Total Protein, Urine 07/17/2020 NEGATIVE  Negative Final  . Urine Glucose 07/17/2020 NEGATIVE  Negative Final  . Ketones, ur 07/17/2020 NEGATIVE  Negative Final  . Bilirubin Urine 07/17/2020 NEGATIVE  Negative Final  . Hgb urine dipstick 07/17/2020 NEGATIVE  Negative Final  . Urobilinogen, UA 07/17/2020 0.2  0.0 - 1.0 Final  . Leukocytes,Ua 07/17/2020 TRACE* Negative Final  . Nitrite 07/17/2020 NEGATIVE  Negative Final  . WBC, UA 07/17/2020 0-2/hpf  0-2/hpf Final  . RBC / HPF 07/17/2020 none seen  0-2/hpf Final  . Squamous Epithelial / LPF 07/17/2020 Rare(0-4/hpf)  Rare(0-4/hpf) Final  . Microalb, Ur 07/17/2020 <0.7  0.0 - 1.9 mg/dL Final  . Creatinine,U 07/17/2020 54.5  mg/dL Final  .  Microalb Creat Ratio 07/17/2020 1.3  0.0 - 30.0 mg/g Final  . VITD 07/17/2020 42.51  30.00 - 100.00 ng/mL Final  . Free T4 07/17/2020 1.08  0.60 - 1.60 ng/dL Final   Comment: Specimens from patients who are undergoing biotin therapy and /or ingesting biotin supplements may contain high levels of biotin.  The higher biotin concentration in these specimens interferes with this Free T4 assay.  Specimens that contain high levels  of biotin may cause false high results for this Free T4 assay.  Please interpret results in light of the total clinical presentation of the patient.     Marland Kitchen TSH 07/17/2020 4.92* 0.35 - 4.50 uIU/mL Final  . Sodium 07/17/2020 130* 135 - 145 mEq/L Final  . Potassium 07/17/2020 4.2  3.5 - 5.1 mEq/L Final  . Chloride 07/17/2020 99  96 - 112 mEq/L Final  . CO2 07/17/2020 24  19 - 32 mEq/L Final  . Glucose, Bld 07/17/2020 118* 70 - 99 mg/dL Final  . BUN 07/17/2020 25* 6 - 23 mg/dL Final  . Creatinine, Ser 07/17/2020 0.72  0.40 - 1.20 mg/dL Final  . Total Bilirubin 07/17/2020 0.3  0.2 - 1.2 mg/dL Final  . Alkaline Phosphatase 07/17/2020 44  39 - 117 U/L Final  . AST 07/17/2020 16  0 - 37 U/L Final  . ALT 07/17/2020 15  0 - 35 U/L Final  . Total Protein 07/17/2020 7.2  6.0 - 8.3 g/dL Final  . Albumin 07/17/2020 4.1  3.5 - 5.2 g/dL Final  . GFR 07/17/2020 80.62  >60.00 mL/min Final   Calculated using the CKD-EPI Creatinine Equation (2021)  . Calcium 07/17/2020 10.0  8.4 - 10.5 mg/dL Final  . Hgb A1c MFr Bld 07/17/2020 6.0  4.6 - 6.5 % Final   Glycemic Control Guidelines for People with Diabetes:Non Diabetic:  <6%Goal of Therapy: <7%Additional Action Suggested:  >8%       Past Medical History:  Diagnosis Date  . Abnormal finding on EKG, new anterolateral T-wave inversions  05/14/2012  . Abnormal nuclear stress test, with infero-lateral ischemia 05/14/2012  . Arthritis    "right thumb; all my fingers" (05/13/2012), cervical spondylosis   . Atypical angina ([place]) 05/14/2012  . CAD (coronary artery disease), 05/13/12, with 80% LAD 05/14/2012  . Cancer ([place])    basal cell on facial in L temporal region    . CMC arthritis, thumb, degenerative   . Complication of anesthesia 12/2009   "had endoscopy; larynx went into spasms; stopped breathing for 15 seconds" (05/13/2012)  . GERD (gastroesophageal reflux disease) 2011  . Hypercholesteremia   . Hypertension   . S/P angioplasty with stent, LAD 05/13/12 05/14/2012  . Tuberculosis    test +- [facility] [facility] - Dr. [name]- CXR- OK    Past Surgical History:  Procedure Laterality Date  . ANTERIOR  CERVICAL DECOMP/DISCECTOMY FUSION N/A 12/13/2013   Procedure: ANTERIOR CERVICAL DECOMPRESSION/DISCECTOMY FUSION 3 LEVELS Cervical four/five,five/six,six/seven. Anterior cervical disectomy and fusion with peek and plate ;  Surgeon: [name], MD;  Location: [facility];  Service: Neurosurgery;  Laterality: N/A;  . CARDIAC CATHETERIZATION N/A 07/02/2015   Procedure: Left Heart Cath and Coronary Angiography;  Surgeon: [name], MD;  Location: [facility];  Service: Cardiovascular;  Laterality: N/A;  . CORONARY ANGIOPLASTY WITH STENT PLACEMENT  05/13/2012   DES to LAD; she has total RCA with left to rt coll. normal LV function done for positive nuc study  . DILATION AND CURETTAGE OF UTERUS  1960's; 1970's; 1980   "probably 3" (05/13/2012)  . FOREARM / WRIST TENDON LESION EXCISION  ?11/2001   "right; did waver to try to get ulnar out of hole that it had cut" (05/13/2012  . INGUINAL HERNIA REPAIR  ~ 1966; 07/21/2002   "right; left" (05/13/2012)  . LEFT HEART CATHETERIZATION WITH CORONARY ANGIOGRAM N/A 05/13/2012   Procedure: LEFT HEART CATHETERIZATION WITH CORONARY ANGIOGRAM;  Surgeon: [name], MD;  Location: [facility];  Service: Cardiovascular;  Laterality: N/A;  . OSTEOTOMY AND ULNAR SHORTENING  07/21/2002   "right" (05/13/2012)  . TONSILLECTOMY AND ADENOIDECTOMY  1950's  . TUBAL LIGATION  1980    Family History  Problem Relation Age of Onset  . Hypertension Mother   . Diabetes Mother   . Hyperlipidemia Father   . Heart disease Father   . Stroke Father   . Heart disease Sister   . Diabetes Sister   . Heart disease Brother   . Diabetes Maternal Aunt     Social History:  reports that she quit smoking about 32 years ago. Her smoking use included cigarettes. She has a 10.00 pack-year smoking history. She has never used smokeless tobacco. She reports current alcohol use of about 12.0 standard drinks of alcohol per week. She reports that she does not use  drugs.  Allergies:  Allergies  Allergen Reactions  . Statins Other (See Comments)    "intermittent loss of circulation; hands and arms will go to sleep; feet will get cramps; fatigue" (05/13/2012).  This occurred with Lipitor, Zocor, Crestor 5 mg qd, and she thinks pravastatin as well  . Amlodipine Itching  . Gadolinium Derivatives Itching and Nausea Only    Pt had severe nausea and itching on legs, neck and back, per dr [name] pt needs 13 hour prep before contrast in the future    Allergies as of 07/20/2020      Reactions   Statins Other (See Comments)   "intermittent loss of circulation; hands and arms will go to sleep; feet will get cramps; fatigue" (05/13/2012).  This occurred with Lipitor, Zocor, Crestor 5 mg qd, and she thinks pravastatin as well   Amlodipine Itching   Gadolinium Derivatives Itching, Nausea Only   Pt had severe nausea and itching on legs, neck and back, per dr [name] pt needs 13 hour prep before contrast in the future      Medication List       Accurate as of July 20, 2020 10:36 AM. If you have any questions, ask your nurse or doctor.        acetaminophen 500 MG tablet Commonly known as: TYLENOL Take 500 mg by mouth every 6 (six) hours as needed for mild pain or headache.   aspirin 81 MG tablet Take 81 mg by mouth daily.   calcium-vitamin D 500-200 MG-UNIT tablet Commonly known as: OSCAL WITH D Take 1 tablet by mouth daily as needed.   carvedilol 12.5 MG tablet Commonly known as: COREG Take 12.5 mg by mouth 2 (two) times daily with a meal.   clopidogrel 75 MG tablet Commonly known as: PLAVIX TAKE 1 TABLET(75 MG) BY MOUTH DAILY   diclofenac sodium 1 % Gel Commonly known as: VOLTAREN Apply 2 g topically daily as needed. For pain   Dilt-XR 240 MG 24 hr capsule Generic drug: diltiazem Take 240 mg by mouth daily.   ezetimibe 10 MG tablet Commonly known as: ZETIA Take 0.5 tablets (5 mg total) by mouth daily.   fenofibrate 160  MG tablet TAKE  1 TABLET(160 MG) BY MOUTH DAILY What changed: See the new instructions.   glucose blood test strip Commonly known as: OneTouch Verio Use to test blood sugar once daily Dx code E11.9   hydrocortisone valerate cream 0.2 % Commonly known as: WESTCORT Apply 1 application topically daily as needed (ITCHING).   irbesartan 300 MG tablet Commonly known as: AVAPRO Take 0.5 tablets (150 mg total) by mouth in the morning and at bedtime. TAKE 1 TABLET(300 MG) BY MOUTH DAILY   levothyroxine 25 MCG tablet Commonly known as: SYNTHROID TAKE 1 TABLET(25 MCG) BY MOUTH DAILY BEFORE BREAKFAST   metFORMIN 500 MG tablet Commonly known as: GLUCOPHAGE Take 1,000 mg by mouth daily with breakfast.   multivitamin with minerals Tabs tablet Take 1 tablet by mouth daily.   nitroGLYCERIN 0.4 MG SL tablet Commonly known as: NITROSTAT PLACE 1 TABLET UNDER THE TONGUE EVERY 5 MINUTES AS NEEDED FOR CHEST PAIN   omega-3 acid ethyl esters 1 g capsule Commonly known as: LOVAZA Take 2 capsules (2 g total) by mouth 2 (two) times daily.   PRESERVISION AREDS 2+MULTI VIT PO Take 1 tablet by mouth in the morning and at bedtime.   Repatha SureClick 448 MG/ML [place] Generic drug: Evolocumab INJECT 1 PEN UNDER THE SKIN EVERY 14 DAYS   triamterene-hydrochlorothiazide 37.5-25 MG tablet Commonly known as: MAXZIDE-25 Take 1 tablet by mouth daily. Patient taking  1/4 tablet   Vitamin D3 50 MCG (2000 UT) Tabs Take 2,000 Units by mouth 2 (two) times daily.       LABS:  Lab on 07/17/2020  Component Date Value Ref Range Status  . Color, Urine 07/17/2020 YELLOW  Yellow;Lt. Yellow;Straw;Dark Yellow;Amber;Green;Red;Brown Final  . APPearance 07/17/2020 CLEAR  Clear;Turbid;Slightly Cloudy;Cloudy Final  . Specific Gravity, Urine 07/17/2020 1.015  1.000 - 1.030 Final  . pH 07/17/2020 7.0  5.0 - 8.0 Final  . Total Protein, Urine 07/17/2020 NEGATIVE  Negative Final  . Urine Glucose 07/17/2020 NEGATIVE  Negative Final  .  Ketones, ur 07/17/2020 NEGATIVE  Negative Final  . Bilirubin Urine 07/17/2020 NEGATIVE  Negative Final  . Hgb urine dipstick 07/17/2020 NEGATIVE  Negative Final  . Urobilinogen, UA 07/17/2020 0.2  0.0 - 1.0 Final  . Leukocytes,Ua 07/17/2020 TRACE* Negative Final  . Nitrite 07/17/2020 NEGATIVE  Negative Final  . WBC, UA 07/17/2020 0-2/hpf  0-2/hpf Final  . RBC / HPF 07/17/2020 none seen  0-2/hpf Final  . Squamous Epithelial / LPF 07/17/2020 Rare(0-4/hpf)  Rare(0-4/hpf) Final  . Microalb, Ur 07/17/2020 <0.7  0.0 - 1.9 mg/dL Final  . Creatinine,U 07/17/2020 54.5  mg/dL Final  . Microalb Creat Ratio 07/17/2020 1.3  0.0 - 30.0 mg/g Final  . VITD 07/17/2020 42.51  30.00 - 100.00 ng/mL Final  . Free T4 07/17/2020 1.08  0.60 - 1.60 ng/dL Final   Comment: Specimens from patients who are undergoing biotin therapy and /or ingesting biotin supplements may contain high levels of biotin.  The higher biotin concentration in these specimens interferes with this Free T4 assay.  Specimens that contain high levels  of biotin may cause false high results for this Free T4 assay.  Please interpret results in light of the total clinical presentation of the patient.    Marland Kitchen TSH 07/17/2020 4.92* 0.35 - 4.50 uIU/mL Final  . Sodium 07/17/2020 130* 135 - 145 mEq/L Final  . Potassium 07/17/2020 4.2  3.5 - 5.1 mEq/L Final  . Chloride 07/17/2020 99  96 - 112 mEq/L Final  . CO2  07/17/2020 24  19 - 32 mEq/L Final  . Glucose, Bld 07/17/2020 118* 70 - 99 mg/dL Final  . BUN 07/17/2020 25* 6 - 23 mg/dL Final  . Creatinine, Ser 07/17/2020 0.72  0.40 - 1.20 mg/dL Final  . Total Bilirubin 07/17/2020 0.3  0.2 - 1.2 mg/dL Final  . Alkaline Phosphatase 07/17/2020 44  39 - 117 U/L Final  . AST 07/17/2020 16  0 - 37 U/L Final  . ALT 07/17/2020 15  0 - 35 U/L Final  . Total Protein 07/17/2020 7.2  6.0 - 8.3 g/dL Final  . Albumin 07/17/2020 4.1  3.5 - 5.2 g/dL Final  . GFR 07/17/2020 80.62  >60.00 mL/min Final   Calculated using the  CKD-EPI Creatinine Equation (2021)  . Calcium 07/17/2020 10.0  8.4 - 10.5 mg/dL Final  . Hgb A1c MFr Bld 07/17/2020 6.0  4.6 - 6.5 % Final   Glycemic Control Guidelines for People with Diabetes:Non Diabetic:  <6%Goal of Therapy: <7%Additional Action Suggested:  >8%       Review of Systems    OSTEOPENIA:   Lowest T score is at the right neck femur previously -2.4  This is now only 0.8 as of 11/14/2019  However BMD at the left femoral neck is last -2.0 compared to -1.8  Apparently had tried Fosamax in the past and this caused some GI side effects She received RECLAST infusion on 12/15/17 and again on 01/07/2020 She will have her next infusion in 12/2021  HYPERTENSION: Treated with Avapro, maxzide half tablet and Coreg, Followed by PCP. Home BP checks regularly also   History of hyperlipidemia with some intolerance to statins, on Repatha and also on Zetia.    Has high triglycerides, apparently previously having muscle aches and was told to stop fenofibrate with which her muscle aches are improved  Last fasting lipids as follows  Lab Results  Component Value Date   CHOL 173 05/24/2020   HDL 46 05/24/2020   LDLCALC 87 05/24/2020   LDLDIRECT 130.1 04/04/2014   TRIG 243 (H) 05/24/2020   CHOLHDL 3.8 05/24/2020      HYPOTHYROIDISM: TSH level previously has been as high as 5.6  This spontaneously improved but in 05/2018 was mildly increased again  At that time she was complaining of some fatigue, constipation, dry skin, some cold intolerance and difficulty losing weight.  She is taking levothyroxine 25 mcg daily, previously had some improvement in her fatigue but she is complaining of tiredness again  Has gained some weight recently and she is concerned about increased abdominal girth  Her TSH is slightly higher at 4.9  Lab Results  Component Value Date   TSH 4.92 (H) 07/17/2020   TSH 4.47 04/03/2020   TSH 4.23 11/29/2019   FREET4 1.08 07/17/2020   FREET4 1.13 04/03/2020    FREET4 1.17 11/29/2019     PHYSICAL EXAM:  BP 140/82   Pulse 72   Ht 5\' 1"  (1.549 m)   Wt 140 lb (63.5 kg)   SpO2 98%   BMI 26.45 kg/m      ASSESSMENT:   Prediabetes with impaired fasting glucose and also impaired glucose tolerance  Her A1c is better at 6%  She is on 1000 mg of Metformin ER which she is tolerating Her morning sugars are doing in the prediabetic range averaging 104 with highest 118 Currently not able to exercise or lose weight   HYPOTHYROIDISM: TSH is 4.9 an slightly higher than before, she is complaining of fatigue Currently on  25 mcg levothyroxine   Mild hyponatremia related to triamterene HCT, asymptomatic   PLAN:   Continue 1000 mg Metformin Encourage her to exercise as much as possible and continue upper body exercises as well as consider water exercises when she can go Continue to reduce calorie content of foods  She will take 1-1/2 tablets of her levothyroxine 25 mcg for hypothyroidism as a trial  Stay on vitamin D supplements and calcium for osteopenia  She will discuss her hyponatremia with PCP, currently on thiazide diuretics  Follow-up in  4 months     Delance Weide 07/20/2020, 10:36 AM

## 2020-07-20 NOTE — Patient Instructions (Signed)
Take 1 1/2 thyroid pill of 25ug every am

## 2020-07-22 DIAGNOSIS — H353122 Nonexudative age-related macular degeneration, left eye, intermediate dry stage: Secondary | ICD-10-CM | POA: Diagnosis not present

## 2020-07-30 DIAGNOSIS — E039 Hypothyroidism, unspecified: Secondary | ICD-10-CM | POA: Diagnosis not present

## 2020-07-30 DIAGNOSIS — E782 Mixed hyperlipidemia: Secondary | ICD-10-CM | POA: Diagnosis not present

## 2020-07-30 DIAGNOSIS — I2510 Atherosclerotic heart disease of native coronary artery without angina pectoris: Secondary | ICD-10-CM | POA: Diagnosis not present

## 2020-07-30 DIAGNOSIS — I10 Essential (primary) hypertension: Secondary | ICD-10-CM | POA: Diagnosis not present

## 2020-08-01 DIAGNOSIS — Z79899 Other long term (current) drug therapy: Secondary | ICD-10-CM | POA: Diagnosis not present

## 2020-08-08 ENCOUNTER — Other Ambulatory Visit: Payer: Self-pay

## 2020-08-08 ENCOUNTER — Encounter (INDEPENDENT_AMBULATORY_CARE_PROVIDER_SITE_OTHER): Payer: Self-pay | Admitting: Ophthalmology

## 2020-08-08 ENCOUNTER — Ambulatory Visit (INDEPENDENT_AMBULATORY_CARE_PROVIDER_SITE_OTHER): Payer: Medicare Other | Admitting: Ophthalmology

## 2020-08-08 DIAGNOSIS — H353211 Exudative age-related macular degeneration, right eye, with active choroidal neovascularization: Secondary | ICD-10-CM | POA: Diagnosis not present

## 2020-08-08 MED ORDER — BEVACIZUMAB 2.5 MG/0.1ML IZ SOSY
2.5000 mg | PREFILLED_SYRINGE | INTRAVITREAL | Status: AC | PRN
  Administered 2020-08-08: 2.5 mg via INTRAVITREAL

## 2020-08-08 NOTE — Assessment & Plan Note (Signed)
Improved macular anatomy and improved acuity post injection #1 Avastin for subretinal fluid associated with wet ARMD.  We will repeat injection today to continue to suppress the recurrence of CNVM OD.  Follow-up in 5 weeks

## 2020-08-08 NOTE — Progress Notes (Signed)
08/08/2020     CHIEF COMPLAINT Patient presents for Retina Follow Up (5 Wk F/U OD, poss Avastin OD//Pt sts she doesn't see as well out of her glasses OD. Pt sts she wants to know if she need to get new lens for glasses OD. No other new symptoms reported OU.)   HISTORY OF PRESENT ILLNESS: Brittany Figueroa is a 80 y.o. female who presents to the clinic today for:   HPI    Retina Follow Up    Patient presents with  Wet AMD.  In right eye.  This started 5 weeks ago.  Severity is mild.  Duration of 5 weeks.  Since onset it is stable. Additional comments: 5 Wk F/U OD, poss Avastin OD  Pt sts she doesn't see as well out of her glasses OD. Pt sts she wants to know if she need to get new lens for glasses OD. No other new symptoms reported OU.       Last edited by Rockie Neighbours, [place] on 08/08/2020 10:53 AM. (History)      Referring physician: No referring provider defined for this encounter.  HISTORICAL INFORMATION:   Selected notes from the MEDICAL RECORD NUMBER    Lab Results  Component Value Date   HGBA1C 6.0 07/17/2020     CURRENT MEDICATIONS: No current outpatient medications on file. (Ophthalmic Drugs)   No current facility-administered medications for this visit. (Ophthalmic Drugs)   Current Outpatient Medications (Other)  Medication Sig   acetaminophen (TYLENOL) 500 MG tablet Take 500 mg by mouth every 6 (six) hours as needed for mild pain or headache.   aspirin 81 MG tablet Take 81 mg by mouth daily.   calcium-vitamin D (OSCAL WITH D) 500-200 MG-UNIT per tablet Take 1 tablet by mouth daily as needed.   carvedilol (COREG) 12.5 MG tablet Take 12.5 mg by mouth 2 (two) times daily with a meal.   Cholecalciferol (VITAMIN D3) 2000 UNITS TABS Take 2,000 Units by mouth 2 (two) times daily.   clopidogrel (PLAVIX) 75 MG tablet TAKE 1 TABLET(75 MG) BY MOUTH DAILY   diclofenac sodium (VOLTAREN) 1 % GEL Apply 2 g topically daily as needed. For pain   DILT-XR 240 MG 24 hr  capsule Take 240 mg by mouth daily.   ezetimibe (ZETIA) 10 MG tablet Take 0.5 tablets (5 mg total) by mouth daily.   fenofibrate 160 MG tablet TAKE 1 TABLET(160 MG) BY MOUTH DAILY (Patient taking differently: Take 80 mg by mouth daily.)   glucose blood (ONETOUCH VERIO) test strip Use to test blood sugar once daily Dx code E11.9   hydrocortisone valerate cream (WESTCORT) 0.2 % Apply 1 application topically daily as needed (ITCHING).   irbesartan (AVAPRO) 300 MG tablet Take 0.5 tablets (150 mg total) by mouth in the morning and at bedtime. TAKE 1 TABLET(300 MG) BY MOUTH DAILY   levothyroxine (SYNTHROID) 25 MCG tablet TAKE 1 TABLET(25 MCG) BY MOUTH DAILY BEFORE BREAKFAST   metFORMIN (GLUCOPHAGE) 500 MG tablet Take 1,000 mg by mouth daily with breakfast.   Multiple Vitamin (MULTIVITAMIN WITH MINERALS) TABS Take 1 tablet by mouth daily.   Multiple Vitamins-Minerals (PRESERVISION AREDS 2+MULTI VIT PO) Take 1 tablet by mouth in the morning and at bedtime.   nitroGLYCERIN (NITROSTAT) 0.4 MG SL tablet PLACE 1 TABLET UNDER THE TONGUE EVERY 5 MINUTES AS NEEDED FOR CHEST PAIN   omega-3 acid ethyl esters (LOVAZA) 1 g capsule Take 2 capsules (2 g total) by mouth 2 (two) times daily.  REPATHA SURECLICK 540 MG/ML SOAJ INJECT 1 PEN UNDER THE SKIN EVERY 14 DAYS   triamterene-hydrochlorothiazide (MAXZIDE-25) 37.5-25 MG tablet Take 1 tablet by mouth daily. Patient taking  1/4 tablet   No current facility-administered medications for this visit. (Other)      REVIEW OF SYSTEMS:    ALLERGIES Allergies  Allergen Reactions   Statins Other (See Comments)    "intermittent loss of circulation; hands and arms will go to sleep; feet will get cramps; fatigue" (05/13/2012).  This occurred with Lipitor, Zocor, Crestor 5 mg qd, and she thinks pravastatin as well   Amlodipine Itching   Gadolinium Derivatives Itching and Nausea Only    Pt had severe nausea and itching on legs, neck and back, per dr  [name] pt needs 13 hour prep before contrast in the future    PAST MEDICAL HISTORY Past Medical History:  Diagnosis Date   Abnormal finding on EKG, new anterolateral T-wave inversions  05/14/2012   Abnormal nuclear stress test, with infero-lateral ischemia 05/14/2012   Arthritis    "right thumb; all my fingers" (05/13/2012), cervical spondylosis    Atypical angina ([place]) 05/14/2012   CAD (coronary artery disease), 05/13/12, with 80% LAD 05/14/2012   Cancer ([place])    basal cell on facial in L temporal region     [facility] arthritis, thumb, degenerative    Complication of anesthesia 12/2009   "had endoscopy; larynx went into spasms; stopped breathing for 15 seconds" (05/13/2012)   GERD (gastroesophageal reflux disease) 2011   Hypercholesteremia    Hypertension    S/P angioplasty with stent, LAD 05/13/12 05/14/2012   Tuberculosis    test +- [place] [facility] - Dr. [name]- CXR- OK   Past Surgical History:  Procedure Laterality Date   ANTERIOR CERVICAL DECOMP/DISCECTOMY FUSION N/A 12/13/2013   Procedure: ANTERIOR CERVICAL DECOMPRESSION/DISCECTOMY FUSION 3 LEVELS Cervical four/five,five/six,six/seven. Anterior cervical disectomy and fusion with peek and plate ;  Surgeon: [name], MD;  Location: [place] [facility];  Service: Neurosurgery;  Laterality: N/A;   CARDIAC CATHETERIZATION N/A 07/02/2015   Procedure: Left Heart Cath and Coronary Angiography;  Surgeon: [name], MD;  Location: [place] [facility];  Service: Cardiovascular;  Laterality: N/A;   CORONARY ANGIOPLASTY WITH STENT PLACEMENT  05/13/2012   DES to LAD; she has total RCA with left to rt coll. normal LV function done for positive nuc study   DILATION AND CURETTAGE OF UTERUS  1960's; 1970's; 1980   "probably 3" (05/13/2012)   FOREARM / WRIST TENDON LESION EXCISION  ?11/2001   "right; did waver to try to get ulnar out of hole that it had cut" (05/13/2012   INGUINAL HERNIA REPAIR  ~ 1966; 07/21/2002   "right; left"  (05/13/2012)   LEFT HEART CATHETERIZATION WITH CORONARY ANGIOGRAM N/A 05/13/2012   Procedure: LEFT HEART CATHETERIZATION WITH CORONARY ANGIOGRAM;  Surgeon: [name], MD;  Location: [facility] CATH LAB;  Service: Cardiovascular;  Laterality: N/A;   OSTEOTOMY AND ULNAR SHORTENING  07/21/2002   "right" (05/13/2012)   TONSILLECTOMY AND ADENOIDECTOMY  1950's   TUBAL LIGATION  1980    FAMILY HISTORY Family History  Problem Relation Age of Onset   Hypertension Mother    Diabetes Mother    Hyperlipidemia Father    Heart disease Father    Stroke Father    Heart disease Sister    Diabetes Sister    Heart disease Brother    Diabetes Maternal Aunt     SOCIAL HISTORY Social History   Tobacco  Use   Smoking status: Former Smoker    Packs/day: 0.50    Years: 20.00    Pack years: 10.00    Types: Cigarettes    Quit date: 07/05/1988    Years since quitting: 32.1   Smokeless tobacco: Never Used  Substance Use Topics   Alcohol use: Yes    Alcohol/week: 12.0 standard drinks    Types: 12 Glasses of wine per week    Comment: 05/13/2012 "6-8 oz glass of red wine q hs"    Drug use: No         OPHTHALMIC EXAM: Base Eye Exam    Visual Acuity (ETDRS)      Right Left   Dist cc 20/25 20/20 -2   Correction: Glasses       Tonometry (Tonopen, 10:54 AM)      Right Left   Pressure 14 15       Pupils      Pupils Dark Light Shape React APD   Right PERRL 4 3 Round Brisk None   Left PERRL 4 3 Round Brisk None       Visual Fields (Counting fingers)      Left Right    Full Full       Extraocular Movement      Right Left    Full Full       Neuro/Psych    Oriented x3: Yes   Mood/Affect: Normal       Dilation    Right eye: 1.0% Mydriacyl, 2.5% Phenylephrine @ 10:58 AM        Slit Lamp and Fundus Exam    External Exam      Right Left   External Normal Normal       Slit Lamp Exam      Right Left   Lids/Lashes Normal Normal   Conjunctiva/Sclera White and  quiet White and quiet   Cornea Clear Clear   Anterior Chamber Deep and quiet Deep and quiet   Iris Round and reactive Round and reactive   Lens 2+ Nuclear sclerosis 2+ Nuclear sclerosis   Anterior Vitreous Normal Normal       Fundus Exam      Right Left   Posterior Vitreous Normal    Disc Normal    C/D Ratio 0.45    Macula Hard drusen, Microaneurysms, Macular thickening    Vessels Normal    Periphery Normal           IMAGING AND PROCEDURES  Imaging and Procedures for 08/08/20  OCT, Retina - OU - Both Eyes       Right Eye Quality was good. Scan locations included subfoveal. Central Foveal Thickness: 275. Progression has improved. Findings include no IRF, no SRF, retinal drusen .   Left Eye Quality was good. Scan locations included subfoveal. Central Foveal Thickness: 278. Progression has been stable.   Notes Much less subretinal fluid post intravitreal Avastin injection #1 OD will continue with repeat medication and delivery today OD       Intravitreal Injection, Pharmacologic Agent - OD - Right Eye       Time Out 08/08/2020. 11:40 AM. Confirmed correct patient, procedure, site, and patient consented.   Anesthesia Topical anesthesia was used. Anesthetic medications included Ak[place] 3.5%.   Procedure Preparation included Tobramycin 0.3%, 10% betadine to eyelids, 5% betadine to ocular surface. A 30 gauge needle was used.   Injection:  2.5 mg Bevacizumab (AVASTIN) 2.5mg /0.70mL SOSY   NDC: 38182-993-71, Lot: [id number]   Route:  Intravitreal, Site: Right Eye  Post-op Post injection exam found visual acuity of at least counting fingers. The patient tolerated the procedure well. There were no complications. The patient received written and verbal post procedure care education. Post injection medications were not given.                 ASSESSMENT/PLAN:  Exudative age-related macular degeneration of right eye with active choroidal neovascularization  (HCC) Improved macular anatomy and improved acuity post injection #1 Avastin for subretinal fluid associated with wet ARMD.  We will repeat injection today to continue to suppress the recurrence of CNVM OD.  Follow-up in 5 weeks      ICD-10-CM   1. Exudative age-related macular degeneration of right eye with active choroidal neovascularization (HCC)  H35.3211 OCT, Retina - OU - Both Eyes    Intravitreal Injection, Pharmacologic Agent - OD - Right Eye    bevacizumab (AVASTIN) SOSY 2.5 mg    1.  OD, vastly improved postinjection #1 Avastin for wet ARMD repeat today  2.  OS no signs of CNVM, on OCT today  3.  Ophthalmic Meds Ordered this visit:  Meds ordered this encounter  Medications   bevacizumab (AVASTIN) SOSY 2.5 mg       Return in about 5 weeks (around 09/12/2020) for dilate, OD, AVASTIN OCT.  There are no Patient Instructions on file for this visit.   Explained the diagnoses, plan, and follow up with the patient and they expressed understanding.  Patient expressed understanding of the importance of proper follow up care.   Clent Demark Haylyn Halberg M.D. Diseases & Surgery of the Retina and Vitreous Retina & Diabetic Day 08/08/20     Abbreviations: M myopia (nearsighted); A astigmatism; H hyperopia (farsighted); P presbyopia; Mrx spectacle prescription;  CTL contact lenses; OD right eye; OS left eye; OU both eyes  XT exotropia; ET esotropia; PEK punctate epithelial keratitis; PEE punctate epithelial erosions; DES dry eye syndrome; MGD meibomian gland dysfunction; ATs artificial tears; PFAT's preservative free artificial tears; [place] nuclear sclerotic cataract; PSC posterior subcapsular cataract; ERM epi-retinal membrane; PVD posterior vitreous detachment; RD retinal detachment; DM diabetes mellitus; DR diabetic retinopathy; NPDR non-proliferative diabetic retinopathy; PDR proliferative diabetic retinopathy; CSME clinically significant macular edema; DME diabetic macular edema;  dbh dot blot hemorrhages; CWS cotton wool spot; POAG primary open angle glaucoma; C/D cup-to-disc ratio; HVF humphrey visual field; GVF goldmann visual field; OCT optical coherence tomography; IOP intraocular pressure; BRVO Branch retinal vein occlusion; CRVO central retinal vein occlusion; CRAO central retinal artery occlusion; BRAO branch retinal artery occlusion; RT retinal tear; SB scleral buckle; PPV pars plana vitrectomy; VH Vitreous hemorrhage; PRP panretinal laser photocoagulation; IVK intravitreal kenalog; VMT vitreomacular traction; MH Macular hole;  NVD neovascularization of the disc; NVE neovascularization elsewhere; AREDS age related eye disease study; ARMD age related macular degeneration; POAG primary open angle glaucoma; EBMD epithelial/anterior basement membrane dystrophy; ACIOL anterior chamber intraocular lens; IOL intraocular lens; PCIOL posterior chamber intraocular lens; Phaco/IOL phacoemulsification with intraocular lens placement; [place] photorefractive keratectomy; LASIK laser assisted in situ keratomileusis; HTN hypertension; DM diabetes mellitus; COPD chronic obstructive pulmonary disease

## 2020-08-13 ENCOUNTER — Ambulatory Visit (INDEPENDENT_AMBULATORY_CARE_PROVIDER_SITE_OTHER): Payer: Medicare Other | Admitting: Ophthalmology

## 2020-08-13 ENCOUNTER — Encounter (INDEPENDENT_AMBULATORY_CARE_PROVIDER_SITE_OTHER): Payer: Self-pay | Admitting: Ophthalmology

## 2020-08-13 ENCOUNTER — Other Ambulatory Visit: Payer: Self-pay

## 2020-08-13 DIAGNOSIS — H43391 Other vitreous opacities, right eye: Secondary | ICD-10-CM

## 2020-08-13 DIAGNOSIS — H353211 Exudative age-related macular degeneration, right eye, with active choroidal neovascularization: Secondary | ICD-10-CM

## 2020-08-13 NOTE — Progress Notes (Signed)
08/13/2020     CHIEF COMPLAINT Patient presents for Flashes/floaters [facility] floaters OD//Pt reports intermittent spots in [place] OD following injection x 4 days ago. Pt reports redness "blood shot" OD following injection. Pt sts floaters OD are improving. Pt sts redness improving OD. [facility] stable.)   HISTORY OF PRESENT ILLNESS: Brittany Figueroa is a 80 y.o. female who presents to the clinic today for:   HPI    Flashes/floaters    In right eye.  This started 4 days ago.  Duration of 4 days.  Duration Intermittant.  Characterized as small and spots.  Since onset it is gradually improving.  Associated Symptoms Floaters.  Response to treatment was significant improvement. Additional comments: WIP floaters OD  Pt reports intermittent spots in [place] OD following injection x 4 days ago. Pt reports redness "blood shot" OD following injection. Pt sts floaters OD are improving. Pt sts redness improving OD. [facility] stable.       Last edited by Rockie Neighbours, [place] on 08/13/2020 10:14 AM. (History)      Referring physician: No referring provider defined for this encounter.  HISTORICAL INFORMATION:   Selected notes from the MEDICAL RECORD NUMBER    Lab Results  Component Value Date   HGBA1C 6.0 07/17/2020     CURRENT MEDICATIONS: No current outpatient medications on file. (Ophthalmic Drugs)   No current facility-administered medications for this visit. (Ophthalmic Drugs)   Current Outpatient Medications (Other)  Medication Sig  . acetaminophen (TYLENOL) 500 MG tablet Take 500 mg by mouth every 6 (six) hours as needed for mild pain or headache.  Figueroa Kitchen aspirin 81 MG tablet Take 81 mg by mouth daily.  . calcium-vitamin D (OSCAL WITH D) 500-200 MG-UNIT per tablet Take 1 tablet by mouth daily as needed.  . carvedilol (COREG) 12.5 MG tablet Take 12.5 mg by mouth 2 (two) times daily with a meal.  . Cholecalciferol (VITAMIN D3) 2000 UNITS TABS Take 2,000 Units by mouth 2 (two) times daily.  . clopidogrel (PLAVIX)  75 MG tablet TAKE 1 TABLET(75 MG) BY MOUTH DAILY  . diclofenac sodium (VOLTAREN) 1 % GEL Apply 2 g topically daily as needed. For pain  . DILT-XR 240 MG 24 hr capsule Take 240 mg by mouth daily.  Figueroa Kitchen ezetimibe (ZETIA) 10 MG tablet Take 0.5 tablets (5 mg total) by mouth daily.  . fenofibrate 160 MG tablet TAKE 1 TABLET(160 MG) BY MOUTH DAILY (Patient taking differently: Take 80 mg by mouth daily.)  . glucose blood (ONETOUCH VERIO) test strip Use to test blood sugar once daily Dx code E11.9  . hydrocortisone valerate cream (WESTCORT) 0.2 % Apply 1 application topically daily as needed (ITCHING).  Figueroa Kitchen irbesartan (AVAPRO) 300 MG tablet Take 0.5 tablets (150 mg total) by mouth in the morning and at bedtime. TAKE 1 TABLET(300 MG) BY MOUTH DAILY  . levothyroxine (SYNTHROID) 25 MCG tablet TAKE 1 TABLET(25 MCG) BY MOUTH DAILY BEFORE BREAKFAST  . metFORMIN (GLUCOPHAGE) 500 MG tablet Take 1,000 mg by mouth daily with breakfast.  . Multiple Vitamin (MULTIVITAMIN WITH MINERALS) TABS Take 1 tablet by mouth daily.  . Multiple Vitamins-Minerals (PRESERVISION AREDS 2+MULTI VIT PO) Take 1 tablet by mouth in the morning and at bedtime.  . nitroGLYCERIN (NITROSTAT) 0.4 MG SL tablet PLACE 1 TABLET UNDER THE TONGUE EVERY 5 MINUTES AS NEEDED FOR CHEST PAIN  . omega-3 acid ethyl esters (LOVAZA) 1 g capsule Take 2 capsules (2 g total) by mouth 2 (two) times daily.  Figueroa Kitchen REPATHA  SURECLICK 193 MG/ML SOAJ INJECT 1 PEN UNDER THE SKIN EVERY 14 DAYS  . triamterene-hydrochlorothiazide (MAXZIDE-25) 37.5-25 MG tablet Take 1 tablet by mouth daily. Patient taking  1/4 tablet   No current facility-administered medications for this visit. (Other)      REVIEW OF SYSTEMS:    ALLERGIES Allergies  Allergen Reactions  . Statins Other (See Comments)    "intermittent loss of circulation; hands and arms will go to sleep; feet will get cramps; fatigue" (05/13/2012).  This occurred with Lipitor, Zocor, Crestor 5 mg qd, and she thinks  pravastatin as well  . Amlodipine Itching  . Gadolinium Derivatives Itching and Nausea Only    Pt had severe nausea and itching on legs, neck and back, per dr [name] pt needs 13 hour prep before contrast in the future    PAST MEDICAL HISTORY Past Medical History:  Diagnosis Date  . Abnormal finding on EKG, new anterolateral T-wave inversions  05/14/2012  . Abnormal nuclear stress test, with infero-lateral ischemia 05/14/2012  . Arthritis    "right thumb; all my fingers" (05/13/2012), cervical spondylosis   . Atypical angina ([place]) 05/14/2012  . CAD (coronary artery disease), 05/13/12, with 80% LAD 05/14/2012  . Cancer ([place])    basal cell on facial in L temporal region    . CMC arthritis, thumb, degenerative   . Complication of anesthesia 12/2009   "had endoscopy; larynx went into spasms; stopped breathing for 15 seconds" (05/13/2012)  . GERD (gastroesophageal reflux disease) 2011  . Hypercholesteremia   . Hypertension   . S/P angioplasty with stent, LAD 05/13/12 05/14/2012  . Tuberculosis    test +- [place] [facility] - Dr. [name]- CXR- OK   Past Surgical History:  Procedure Laterality Date  . ANTERIOR CERVICAL DECOMP/DISCECTOMY FUSION N/A 12/13/2013   Procedure: ANTERIOR CERVICAL DECOMPRESSION/DISCECTOMY FUSION 3 LEVELS Cervical four/five,five/six,six/seven. Anterior cervical disectomy and fusion with peek and plate ;  Surgeon: [name], MD;  Location: [place] [facility];  Service: Neurosurgery;  Laterality: N/A;  . CARDIAC CATHETERIZATION N/A 07/02/2015   Procedure: Left Heart Cath and Coronary Angiography;  Surgeon: [name], MD;  Location: [place] [facility];  Service: Cardiovascular;  Laterality: N/A;  . CORONARY ANGIOPLASTY WITH STENT PLACEMENT  05/13/2012   DES to LAD; she has total RCA with left to rt coll. normal LV function done for positive nuc study  . DILATION AND CURETTAGE OF UTERUS  1960's; 1970's; 1980   "probably 3" (05/13/2012)  . FOREARM / WRIST TENDON LESION  EXCISION  ?11/2001   "right; did waver to try to get ulnar out of hole that it had cut" (05/13/2012  . INGUINAL HERNIA REPAIR  ~ 1966; 07/21/2002   "right; left" (05/13/2012)  . LEFT HEART CATHETERIZATION WITH CORONARY ANGIOGRAM N/A 05/13/2012   Procedure: LEFT HEART CATHETERIZATION WITH CORONARY ANGIOGRAM;  Surgeon: [name], MD;  Location: [facility] CATH LAB;  Service: Cardiovascular;  Laterality: N/A;  . OSTEOTOMY AND ULNAR SHORTENING  07/21/2002   "right" (05/13/2012)  . TONSILLECTOMY AND ADENOIDECTOMY  1950's  . TUBAL LIGATION  1980    FAMILY HISTORY Family History  Problem Relation Age of Onset  . Hypertension Mother   . Diabetes Mother   . Hyperlipidemia Father   . Heart disease Father   . Stroke Father   . Heart disease Sister   . Diabetes Sister   . Heart disease Brother   . Diabetes Maternal Aunt     SOCIAL HISTORY Social History   Tobacco Use  .  Smoking status: Former Smoker    Packs/day: 0.50    Years: 20.00    Pack years: 10.00    Types: Cigarettes    Quit date: 07/05/1988    Years since quitting: 32.1  . Smokeless tobacco: Never Used  Substance Use Topics  . Alcohol use: Yes    Alcohol/week: 12.0 standard drinks    Types: 12 Glasses of wine per week    Comment: 05/13/2012 "6-8 oz glass of red wine q hs"   . Drug use: No         OPHTHALMIC EXAM: Base Eye Exam    Visual Acuity (ETDRS)      Right Left   Dist cc 20/20 -2 20/25 +2   Correction: Glasses       Tonometry (Tonopen, 10:13 AM)      Right Left   Pressure 12 14       Pupils      Dark Light Shape React APD   Right 4 3 Round Brisk None   Left 4.5 3.5 Round Brisk None       Visual Fields (Counting fingers)      Left Right    Full Full       Extraocular Movement      Right Left    Full Full       Neuro/Psych    Oriented x3: Yes   Mood/Affect: Normal       Dilation    Right eye: 1.0% Mydriacyl, 2.5% Phenylephrine @ 10:17 AM        Slit Lamp and Fundus Exam    External  Exam      Right Left   External Normal Normal       Slit Lamp Exam      Right Left   Lids/Lashes Normal Normal   Conjunctiva/Sclera White and quiet White and quiet   Cornea Clear Clear   Anterior Chamber Deep and quiet Deep and quiet   Iris Round and reactive Round and reactive   Lens 2+ Nuclear sclerosis 2+ Nuclear sclerosis   Anterior Vitreous Normal, no cells Normal       Fundus Exam      Right Left   Posterior Vitreous Normal,  no cells    Disc Normal    C/D Ratio 0.45    Macula Hard drusen, Microaneurysms, Macular thickening    Vessels Normal    Periphery Normal           IMAGING AND PROCEDURES  Imaging and Procedures for 08/13/20  OCT, Retina - OU - Both Eyes       Right Eye Quality was good. Central Foveal Thickness: 274. Progression has been stable.   Left Eye Quality was good. Scan locations included subfoveal. Central Foveal Thickness: 280. Progression has been stable. Findings include abnormal foveal contour.   Notes OD, much improved anatomy overall as compared to February 2022 onset of CNVM with intraretinal and subretinal fluid.  No signs of recurrences.                ASSESSMENT/PLAN:  Vitreous floaters of right eye These have diminished over the ensuing 3 to 4 days, no findings today      ICD-10-CM   1. Exudative age-related macular degeneration of right eye with active choroidal neovascularization (HCC)  H35.3211 OCT, Retina - OU - Both Eyes  2. Vitreous floaters of right eye  H43.391     1.  Wet ARMD still under control, secondary vitreous floaters most likely  from the injection and its triggering a potential for vitreous debris.  No signs of infection or inflammation follow-up as scheduled  2.  3.  Ophthalmic Meds Ordered this visit:  No orders of the defined types were placed in this encounter.      Return for As scheduled, OD, dilate, AVASTIN OCT.  There are no Patient Instructions on file for this  visit.   Explained the diagnoses, plan, and follow up with the patient and they expressed understanding.  Patient expressed understanding of the importance of proper follow up care.   Clent Demark Rankin M.D. Diseases & Surgery of the Retina and Vitreous [facility] [place] 08/13/20     Abbreviations: M myopia (nearsighted); A astigmatism; H hyperopia (farsighted); P presbyopia; Mrx spectacle prescription;  CTL contact lenses; OD right eye; OS left eye; OU both eyes  XT exotropia; ET esotropia; PEK punctate epithelial keratitis; PEE punctate epithelial erosions; DES dry eye syndrome; MGD meibomian gland dysfunction; ATs artificial tears; PFAT's preservative free artificial tears; [place] nuclear sclerotic cataract; PSC posterior subcapsular cataract; ERM epi-retinal membrane; PVD posterior vitreous detachment; RD retinal detachment; DM diabetes mellitus; DR diabetic retinopathy; NPDR non-proliferative diabetic retinopathy; PDR proliferative diabetic retinopathy; CSME clinically significant macular edema; DME diabetic macular edema; dbh dot blot hemorrhages; CWS cotton wool spot; POAG primary open angle glaucoma; C/D cup-to-disc ratio; HVF humphrey visual field; GVF goldmann visual field; OCT optical coherence tomography; IOP intraocular pressure; BRVO Branch retinal vein occlusion; CRVO central retinal vein occlusion; CRAO central retinal artery occlusion; BRAO branch retinal artery occlusion; RT retinal tear; SB scleral buckle; PPV pars plana vitrectomy; VH Vitreous hemorrhage; PRP panretinal laser photocoagulation; IVK intravitreal kenalog; VMT vitreomacular traction; MH Macular hole;  NVD neovascularization of the disc; NVE neovascularization elsewhere; AREDS age related eye disease study; ARMD age related macular degeneration; POAG primary open angle glaucoma; EBMD epithelial/anterior basement membrane dystrophy; ACIOL anterior chamber intraocular lens; IOL intraocular lens; PCIOL posterior chamber  intraocular lens; Phaco/IOL phacoemulsification with intraocular lens placement; [place] photorefractive keratectomy; LASIK laser assisted in situ keratomileusis; HTN hypertension; DM diabetes mellitus; COPD chronic obstructive pulmonary disease

## 2020-08-13 NOTE — Assessment & Plan Note (Signed)
These have diminished over the ensuing 3 to 4 days, no findings today

## 2020-08-20 ENCOUNTER — Other Ambulatory Visit: Payer: Self-pay | Admitting: Endocrinology

## 2020-08-20 ENCOUNTER — Other Ambulatory Visit: Payer: Self-pay | Admitting: Cardiovascular Disease

## 2020-08-21 DIAGNOSIS — H353122 Nonexudative age-related macular degeneration, left eye, intermediate dry stage: Secondary | ICD-10-CM | POA: Diagnosis not present

## 2020-08-22 DIAGNOSIS — E785 Hyperlipidemia, unspecified: Secondary | ICD-10-CM | POA: Diagnosis not present

## 2020-08-22 LAB — LIPID PANEL
Chol/HDL Ratio: 3.6 ratio (ref 0.0–4.4)
Cholesterol, Total: 169 mg/dL (ref 100–199)
HDL: 47 mg/dL (ref >39)
LDL Chol Calc (NIH): 83 mg/dL (ref 0–99)
Triglycerides: 236 mg/dL — ABNORMAL HIGH (ref 0–149)
VLDL Cholesterol Cal: 39 mg/dL (ref 5–40)

## 2020-08-22 LAB — COMPREHENSIVE METABOLIC PANEL
ALT: 16 IU/L (ref 0–32)
AST: 19 IU/L (ref 0–40)
Albumin/Globulin Ratio: 1.9 (ref 1.2–2.2)
Albumin: 4.6 g/dL (ref 3.7–4.7)
Alkaline Phosphatase: 47 IU/L (ref 44–121)
BUN/Creatinine Ratio: 31 — ABNORMAL HIGH (ref 12–28)
BUN: 19 mg/dL (ref 8–27)
Bilirubin Total: 0.4 mg/dL (ref 0.0–1.2)
CO2: 21 mmol/L (ref 20–29)
Calcium: 9.6 mg/dL (ref 8.7–10.3)
Chloride: 98 mmol/L (ref 96–106)
Creatinine, Ser: 0.62 mg/dL (ref 0.57–1.00)
Globulin, Total: 2.4 g/dL (ref 1.5–4.5)
Glucose: 111 mg/dL — ABNORMAL HIGH (ref 65–99)
Potassium: 4.3 mmol/L (ref 3.5–5.2)
Sodium: 133 mmol/L — ABNORMAL LOW (ref 134–144)
Total Protein: 7 g/dL (ref 6.0–8.5)
eGFR: 92 mL/min/{1.73_m2} (ref >59)

## 2020-09-10 DIAGNOSIS — I2510 Atherosclerotic heart disease of native coronary artery without angina pectoris: Secondary | ICD-10-CM | POA: Diagnosis not present

## 2020-09-10 DIAGNOSIS — E782 Mixed hyperlipidemia: Secondary | ICD-10-CM | POA: Diagnosis not present

## 2020-09-10 DIAGNOSIS — E039 Hypothyroidism, unspecified: Secondary | ICD-10-CM | POA: Diagnosis not present

## 2020-09-10 DIAGNOSIS — I10 Essential (primary) hypertension: Secondary | ICD-10-CM | POA: Diagnosis not present

## 2020-09-12 ENCOUNTER — Telehealth: Payer: Self-pay | Admitting: Endocrinology

## 2020-09-12 NOTE — Telephone Encounter (Signed)
Pt would like her b12 checked  if possible as soon as possible.  Pt has been feeling really tired.

## 2020-09-12 NOTE — Telephone Encounter (Signed)
Her fatigue may be from any number of reasons.  She needs to get evaluated by her PCP

## 2020-09-12 NOTE — Telephone Encounter (Signed)
Noted. Patient is aware. 

## 2020-09-17 DIAGNOSIS — H353211 Exudative age-related macular degeneration, right eye, with active choroidal neovascularization: Secondary | ICD-10-CM | POA: Diagnosis not present

## 2020-09-17 DIAGNOSIS — E782 Mixed hyperlipidemia: Secondary | ICD-10-CM | POA: Diagnosis not present

## 2020-09-17 DIAGNOSIS — I10 Essential (primary) hypertension: Secondary | ICD-10-CM | POA: Diagnosis not present

## 2020-09-17 DIAGNOSIS — L299 Pruritus, unspecified: Secondary | ICD-10-CM | POA: Diagnosis not present

## 2020-09-17 DIAGNOSIS — I700 Atherosclerosis of aorta: Secondary | ICD-10-CM | POA: Diagnosis not present

## 2020-09-17 DIAGNOSIS — R5383 Other fatigue: Secondary | ICD-10-CM | POA: Diagnosis not present

## 2020-09-17 DIAGNOSIS — M71341 Other bursal cyst, right hand: Secondary | ICD-10-CM | POA: Diagnosis not present

## 2020-09-18 ENCOUNTER — Encounter (INDEPENDENT_AMBULATORY_CARE_PROVIDER_SITE_OTHER): Payer: Medicare Other | Admitting: Ophthalmology

## 2020-09-19 ENCOUNTER — Other Ambulatory Visit: Payer: Self-pay

## 2020-09-19 ENCOUNTER — Encounter (INDEPENDENT_AMBULATORY_CARE_PROVIDER_SITE_OTHER): Payer: Self-pay | Admitting: Ophthalmology

## 2020-09-19 ENCOUNTER — Ambulatory Visit (INDEPENDENT_AMBULATORY_CARE_PROVIDER_SITE_OTHER): Payer: Medicare Other | Admitting: Ophthalmology

## 2020-09-19 DIAGNOSIS — H43391 Other vitreous opacities, right eye: Secondary | ICD-10-CM

## 2020-09-19 DIAGNOSIS — H353211 Exudative age-related macular degeneration, right eye, with active choroidal neovascularization: Secondary | ICD-10-CM | POA: Diagnosis not present

## 2020-09-19 DIAGNOSIS — H353132 Nonexudative age-related macular degeneration, bilateral, intermediate dry stage: Secondary | ICD-10-CM | POA: Diagnosis not present

## 2020-09-19 MED ORDER — BEVACIZUMAB 2.5 MG/0.1ML IZ SOSY
2.5000 mg | PREFILLED_SYRINGE | INTRAVITREAL | Status: AC | PRN
  Administered 2020-09-19: 2.5 mg via INTRAVITREAL

## 2020-09-19 NOTE — Progress Notes (Signed)
09/19/2020     CHIEF COMPLAINT Patient presents for Retina Follow Up (5 wk fu OD/ Possible Avastin OD //Pt states VA OU stable since last visit. Pt denies FOL, floaters, or ocular pain OU. /A1C:6.0/LBS:95 (Yesterday))   HISTORY OF PRESENT ILLNESS: Brittany Figueroa is a 80 y.o. female who presents to the clinic today for:   HPI    Retina Follow Up    Diagnosis: Wet AMD   Laterality: right eye   Onset: 5 weeks ago   Duration: 5 weeks   Course: stable   Comments: 5 wk fu OD/ Possible Avastin OD   Pt states VA OU stable since last visit. Pt denies FOL, floaters, or ocular pain OU.  A1C:6.0 LBS:95 (Yesterday)       Last edited by Kendra Opitz, COA on 09/19/2020  1:38 PM. (History)      Referring physician: No referring provider defined for this encounter.  HISTORICAL INFORMATION:   Selected notes from the MEDICAL RECORD NUMBER    Lab Results  Component Value Date   HGBA1C 6.0 07/17/2020     CURRENT MEDICATIONS: No current outpatient medications on file. (Ophthalmic Drugs)   No current facility-administered medications for this visit. (Ophthalmic Drugs)   Current Outpatient Medications (Other)  Medication Sig  . acetaminophen (TYLENOL) 500 MG tablet Take 500 mg by mouth every 6 (six) hours as needed for mild pain or headache.  Marland Kitchen aspirin 81 MG tablet Take 81 mg by mouth daily.  . calcium-vitamin D (OSCAL WITH D) 500-200 MG-UNIT per tablet Take 1 tablet by mouth daily as needed.  . carvedilol (COREG) 12.5 MG tablet Take 12.5 mg by mouth 2 (two) times daily with a meal.  . Cholecalciferol (VITAMIN D3) 2000 UNITS TABS Take 2,000 Units by mouth 2 (two) times daily.  . clopidogrel (PLAVIX) 75 MG tablet TAKE 1 TABLET(75 MG) BY MOUTH DAILY  . diclofenac sodium (VOLTAREN) 1 % GEL Apply 2 g topically daily as needed. For pain  . DILT-XR 240 MG 24 hr capsule Take 240 mg by mouth daily.  Marland Kitchen ezetimibe (ZETIA) 10 MG tablet TAKE 1 TABLET(10 MG) BY MOUTH DAILY  . fenofibrate 160 MG  tablet TAKE 1 TABLET(160 MG) BY MOUTH DAILY (Patient taking differently: Take 80 mg by mouth daily.)  . glucose blood (ONETOUCH VERIO) test strip Use to test blood sugar once daily Dx code E11.9  . hydrocortisone valerate cream (WESTCORT) 0.2 % Apply 1 application topically daily as needed (ITCHING).  Marland Kitchen irbesartan (AVAPRO) 300 MG tablet TAKE 1/2 TABLET BY MOUTH TWICE DAILY( IN THE MORNING AND AT BEDTIME)  . levothyroxine (SYNTHROID) 25 MCG tablet TAKE 1 TABLET(25 MCG) BY MOUTH DAILY BEFORE BREAKFAST  . metFORMIN (GLUCOPHAGE) 500 MG tablet Take 1,000 mg by mouth daily with breakfast.  . Multiple Vitamin (MULTIVITAMIN WITH MINERALS) TABS Take 1 tablet by mouth daily.  . Multiple Vitamins-Minerals (PRESERVISION AREDS 2+MULTI VIT PO) Take 1 tablet by mouth in the morning and at bedtime.  . nitroGLYCERIN (NITROSTAT) 0.4 MG SL tablet PLACE 1 TABLET UNDER THE TONGUE EVERY 5 MINUTES AS NEEDED FOR CHEST PAIN  . omega-3 acid ethyl esters (LOVAZA) 1 g capsule Take 2 capsules (2 g total) by mouth 2 (two) times daily.  Marland Kitchen REPATHA SURECLICK 371 MG/ML SOAJ INJECT 1 PEN UNDER THE SKIN EVERY 14 DAYS  . triamterene-hydrochlorothiazide (MAXZIDE-25) 37.5-25 MG tablet Take 1 tablet by mouth daily. Patient taking  1/4 tablet   No current facility-administered medications for this visit. (Other)  REVIEW OF SYSTEMS:    ALLERGIES Allergies  Allergen Reactions  . Statins Other (See Comments)    "intermittent loss of circulation; hands and arms will go to sleep; feet will get cramps; fatigue" (05/13/2012).  This occurred with Lipitor, Zocor, Crestor 5 mg qd, and she thinks pravastatin as well  . Amlodipine Itching  . Gadolinium Derivatives Itching and Nausea Only    Pt had severe nausea and itching on legs, neck and back, per dr [name] pt needs 13 hour prep before contrast in the future    PAST MEDICAL HISTORY Past Medical History:  Diagnosis Date  . Abnormal finding on EKG, new anterolateral T-wave  inversions  05/14/2012  . Abnormal nuclear stress test, with infero-lateral ischemia 05/14/2012  . Arthritis    "right thumb; all my fingers" (05/13/2012), cervical spondylosis   . Atypical angina ([place]) 05/14/2012  . CAD (coronary artery disease), 05/13/12, with 80% LAD 05/14/2012  . Cancer ([place])    basal cell on facial in L temporal region    . CMC arthritis, thumb, degenerative   . Complication of anesthesia 12/2009   "had endoscopy; larynx went into spasms; stopped breathing for 15 seconds" (05/13/2012)  . GERD (gastroesophageal reflux disease) 2011  . Hypercholesteremia   . Hypertension   . S/P angioplasty with stent, LAD 05/13/12 05/14/2012  . Tuberculosis    test +- [facility] - Dr. [name]- CXR- OK   Past Surgical History:  Procedure Laterality Date  . ANTERIOR CERVICAL DECOMP/DISCECTOMY FUSION N/A 12/13/2013   Procedure: ANTERIOR CERVICAL DECOMPRESSION/DISCECTOMY FUSION 3 LEVELS Cervical four/five,five/six,six/seven. Anterior cervical disectomy and fusion with peek and plate ;  Surgeon: [name], MD;  Location: [facility];  Service: Neurosurgery;  Laterality: N/A;  . CARDIAC CATHETERIZATION N/A 07/02/2015   Procedure: Left Heart Cath and Coronary Angiography;  Surgeon: [name], MD;  Location: [facility];  Service: Cardiovascular;  Laterality: N/A;  . CORONARY ANGIOPLASTY WITH STENT PLACEMENT  05/13/2012   DES to LAD; she has total RCA with left to rt coll. normal LV function done for positive nuc study  . DILATION AND CURETTAGE OF UTERUS  1960's; 1970's; 1980   "probably 3" (05/13/2012)  . FOREARM / WRIST TENDON LESION EXCISION  ?11/2001   "right; did waver to try to get ulnar out of hole that it had cut" (05/13/2012  . INGUINAL HERNIA REPAIR  ~ 1966; 07/21/2002   "right; left" (05/13/2012)  . LEFT HEART CATHETERIZATION WITH CORONARY ANGIOGRAM N/A 05/13/2012   Procedure: LEFT HEART CATHETERIZATION WITH CORONARY ANGIOGRAM;  Surgeon: [name], MD;   Location: [facility];  Service: Cardiovascular;  Laterality: N/A;  . OSTEOTOMY AND ULNAR SHORTENING  07/21/2002   "right" (05/13/2012)  . TONSILLECTOMY AND ADENOIDECTOMY  1950's  . TUBAL LIGATION  1980    FAMILY HISTORY Family History  Problem Relation Age of Onset  . Hypertension Mother   . Diabetes Mother   . Hyperlipidemia Father   . Heart disease Father   . Stroke Father   . Heart disease Sister   . Diabetes Sister   . Heart disease Brother   . Diabetes Maternal Aunt     SOCIAL HISTORY Social History   Tobacco Use  . Smoking status: Former Smoker    Packs/day: 0.50    Years: 20.00    Pack years: 10.00    Types: Cigarettes    Quit date: 07/05/1988    Years since quitting: 32.2  . Smokeless tobacco: Never Used  Substance Use Topics  . Alcohol use: Yes    Alcohol/week: 12.0 standard drinks    Types: 12 Glasses of wine per week    Comment: 05/13/2012 "6-8 oz glass of red wine q hs"   . Drug use: No         OPHTHALMIC EXAM:  Base Eye Exam    Visual Acuity (ETDRS)      Right Left   Dist cc 20/20 -2 20/25   Correction: Glasses       Tonometry (Tonopen, 1:45 PM)      Right Left   Pressure 19 18       Pupils      Pupils Dark Light Shape React APD   Right PERRL 4 3 Round Brisk None   Left PERRL 4.5 3.5 Round Brisk None       Visual Fields (Counting fingers)      Left Right    Full Full       Extraocular Movement      Right Left    Full Full       Neuro/Psych    Oriented x3: Yes   Mood/Affect: Normal       Dilation    Right eye: 1.0% Mydriacyl, 2.5% Phenylephrine @ 1:45 PM        Slit Lamp and Fundus Exam    External Exam      Right Left   External Normal Normal       Slit Lamp Exam      Right Left   Lids/Lashes Normal Normal   Conjunctiva/Sclera White and quiet White and quiet   Cornea Clear Clear   Anterior Chamber Deep and quiet Deep and quiet   Iris Round and reactive Round and reactive   Lens 2+ Nuclear sclerosis 2+ Nuclear  sclerosis   Anterior Vitreous Normal, no cells Normal       Fundus Exam      Right Left   Posterior Vitreous Normal,  no cells    Disc Normal    C/D Ratio 0.45    Macula Hard drusen, Microaneurysms, Macular thickening    Vessels Normal    Periphery Normal           IMAGING AND PROCEDURES  Imaging and Procedures for 09/19/20  OCT, Retina - OU - Both Eyes       Right Eye Quality was good. Central Foveal Thickness: 270. Progression has improved. Findings include abnormal foveal contour, no SRF, no IRF.   Left Eye Quality was good. Scan locations included subfoveal. Central Foveal Thickness: 280. Progression has been stable. Findings include abnormal foveal contour.   Notes OD, much improved anatomy overall as compared to February 2022 onset of CNVM with intraretinal and subretinal fluid.  No signs of recurrences.  Currently at 5-week interval we will repeat injection today and examination again in 6 weeks       Intravitreal Injection, Pharmacologic Agent - OD - Right Eye       Time Out 09/19/2020. 2:17 PM. Confirmed correct patient, procedure, site, and patient consented.   Anesthesia Topical anesthesia was used. Anesthetic medications included Akten 3.5%.   Procedure Preparation included Tobramycin 0.3%, 10% betadine to eyelids, 5% betadine to ocular surface. A 30 gauge needle was used.   Injection:  2.5 mg Bevacizumab (AVASTIN) 2.5mg /0.39mL SOSY   NDC: [id number], [id number]:[id number]   Route: Intravitreal, Site: Right Eye  Post-op Post injection exam found visual acuity of at least counting fingers. The patient tolerated the  procedure well. There were no complications. The patient received written and verbal post procedure care education. Post injection medications were not given.                 ASSESSMENT/PLAN:  Exudative age-related macular degeneration of right eye with active choroidal neovascularization (HCC) The nature of wet macular degeneration was  discussed with the patient.  Forms of therapy reviewed include the use of Anti-VEGF medications injected painlessly into the eye, as well as other possible treatment modalities, including thermal laser therapy. Fellow eye involvement and risks were discussed with the patient. Upon the finding of wet age related macular degeneration, treatment will be offered. The treatment regimen is on a treat as needed basis with the intent to treat if necessary and extend interval of exams when possible. On average 1 out of 6 patients do not need lifetime therapy. However, the risk of recurrent disease is high for a lifetime.  Initially monthly, then periodic, examinations and evaluations will determine whether the next treatment is required on the day of the examination.  Onset diagnosis February 2022, now improved on intravitreal Avastin with less intraretinal fluid.  Currently at 5-week follow-up interval.  We will repeat injection today and examination again in 6 weeks  Intermediate stage nonexudative age-related macular degeneration of both eyes No signs of CNVM OS on OCT  Vitreous floaters of right eye Floaters persist, no worsening      ICD-10-CM   1. Exudative age-related macular degeneration of right eye with active choroidal neovascularization (HCC)  H35.3211 OCT, Retina - OU - Both Eyes    Intravitreal Injection, Pharmacologic Agent - OD - Right Eye    bevacizumab (AVASTIN) SOSY 2.5 mg  2. Intermediate stage nonexudative age-related macular degeneration of both eyes  H35.3132   3. Vitreous floaters of right eye  H43.391     1.  OD improved overall findings on CT NVI onset of treatment February 2022.  Repeat injection today at 5-week interval  2.  Dilate OD next in 6 weeks  3.  Ophthalmic Meds Ordered this visit:  Meds ordered this encounter  Medications  . bevacizumab (AVASTIN) SOSY 2.5 mg       Return in about 6 weeks (around 10/31/2020) for dilate, OD, AVASTIN OCT.  There are no  Patient Instructions on file for this visit.   Explained the diagnoses, plan, and follow up with the patient and they expressed understanding.  Patient expressed understanding of the importance of proper follow up care.   Clent Demark Luken Shadowens M.D. Diseases & Surgery of the Retina and Vitreous [facility] [place] 09/19/20     Abbreviations: M myopia (nearsighted); A astigmatism; H hyperopia (farsighted); P presbyopia; Mrx spectacle prescription;  CTL contact lenses; OD right eye; OS left eye; OU both eyes  XT exotropia; ET esotropia; PEK punctate epithelial keratitis; PEE punctate epithelial erosions; DES dry eye syndrome; MGD meibomian gland dysfunction; ATs artificial tears; PFAT's preservative free artificial tears; [place] nuclear sclerotic cataract; PSC posterior subcapsular cataract; ERM epi-retinal membrane; PVD posterior vitreous detachment; RD retinal detachment; DM diabetes mellitus; DR diabetic retinopathy; NPDR non-proliferative diabetic retinopathy; PDR proliferative diabetic retinopathy; CSME clinically significant macular edema; DME diabetic macular edema; dbh dot blot hemorrhages; CWS cotton wool spot; POAG primary open angle glaucoma; C/D cup-to-disc ratio; HVF humphrey visual field; GVF goldmann visual field; OCT optical coherence tomography; IOP intraocular pressure; BRVO Branch retinal vein occlusion; CRVO central retinal vein occlusion; CRAO central retinal artery occlusion; BRAO branch retinal artery  occlusion; RT retinal tear; SB scleral buckle; PPV pars plana vitrectomy; VH Vitreous hemorrhage; PRP panretinal laser photocoagulation; IVK intravitreal kenalog; VMT vitreomacular traction; MH Macular hole;  NVD neovascularization of the disc; NVE neovascularization elsewhere; AREDS age related eye disease study; ARMD age related macular degeneration; POAG primary open angle glaucoma; EBMD epithelial/anterior basement membrane dystrophy; ACIOL anterior chamber intraocular lens; IOL  intraocular lens; PCIOL posterior chamber intraocular lens; Phaco/IOL phacoemulsification with intraocular lens placement; [place] photorefractive keratectomy; LASIK laser assisted in situ keratomileusis; HTN hypertension; DM diabetes mellitus; COPD chronic obstructive pulmonary disease

## 2020-09-19 NOTE — Assessment & Plan Note (Signed)
No signs of CNVM OS on OCT

## 2020-09-19 NOTE — Assessment & Plan Note (Signed)
Floaters persist, no worsening

## 2020-09-19 NOTE — Assessment & Plan Note (Signed)
The nature of wet macular degeneration was discussed with the patient.  Forms of therapy reviewed include the use of Anti-VEGF medications injected painlessly into the eye, as well as other possible treatment modalities, including thermal laser therapy. Fellow eye involvement and risks were discussed with the patient. Upon the finding of wet age related macular degeneration, treatment will be offered. The treatment regimen is on a treat as needed basis with the intent to treat if necessary and extend interval of exams when possible. On average 1 out of 6 patients do not need lifetime therapy. However, the risk of recurrent disease is high for a lifetime.  Initially monthly, then periodic, examinations and evaluations will determine whether the next treatment is required on the day of the examination.  Onset diagnosis February 2022, now improved on intravitreal Avastin with less intraretinal fluid.  Currently at 5-week follow-up interval.  We will repeat injection today and examination again in 6 weeks

## 2020-09-20 DIAGNOSIS — H353122 Nonexudative age-related macular degeneration, left eye, intermediate dry stage: Secondary | ICD-10-CM | POA: Diagnosis not present

## 2020-10-01 ENCOUNTER — Telehealth: Payer: Self-pay | Admitting: Cardiovascular Disease

## 2020-10-01 NOTE — Telephone Encounter (Signed)
Patient states she recently had an EKG with Dr. Mateo Flow at [facility]. She states it was abnormal and she would like a call back to discuss further. She requested a voice message if she is unavailable when her call is returned.

## 2020-10-01 NOTE — Telephone Encounter (Signed)
Spoke with pt regarding abnormal EKG that was done at Dr. Bronwen Betters office with [facility]. Pt is concerned that the abnormality of this EKG may show issues with her stent. Pt would like to bring over a copy of this EKG for Dr. Gwenlyn Found to interpret. Will follow up. Pt verbalized understanding.

## 2020-10-02 ENCOUNTER — Telehealth: Payer: Self-pay

## 2020-10-02 NOTE — Telephone Encounter (Signed)
Left message for pt (ok per DPR) regarding EKG. Per Dr. Gwenlyn Found nothing alarming about EKG, normal sinus rhythm. Instructed pt to call back with any questions.

## 2020-10-03 DIAGNOSIS — H353211 Exudative age-related macular degeneration, right eye, with active choroidal neovascularization: Secondary | ICD-10-CM | POA: Diagnosis not present

## 2020-10-03 DIAGNOSIS — H353121 Nonexudative age-related macular degeneration, left eye, early dry stage: Secondary | ICD-10-CM | POA: Diagnosis not present

## 2020-10-11 DIAGNOSIS — M67441 Ganglion, right hand: Secondary | ICD-10-CM | POA: Diagnosis not present

## 2020-10-20 DIAGNOSIS — H353122 Nonexudative age-related macular degeneration, left eye, intermediate dry stage: Secondary | ICD-10-CM | POA: Diagnosis not present

## 2020-10-23 ENCOUNTER — Other Ambulatory Visit (INDEPENDENT_AMBULATORY_CARE_PROVIDER_SITE_OTHER): Payer: Medicare Other

## 2020-10-23 ENCOUNTER — Other Ambulatory Visit: Payer: Self-pay

## 2020-10-23 DIAGNOSIS — E039 Hypothyroidism, unspecified: Secondary | ICD-10-CM | POA: Diagnosis not present

## 2020-10-23 DIAGNOSIS — E559 Vitamin D deficiency, unspecified: Secondary | ICD-10-CM

## 2020-10-23 DIAGNOSIS — E871 Hypo-osmolality and hyponatremia: Secondary | ICD-10-CM | POA: Diagnosis not present

## 2020-10-23 DIAGNOSIS — R7303 Prediabetes: Secondary | ICD-10-CM

## 2020-10-23 LAB — BASIC METABOLIC PANEL
BUN: 12 mg/dL (ref 6–23)
CO2: 22 mEq/L (ref 19–32)
Calcium: 9.6 mg/dL (ref 8.4–10.5)
Chloride: 101 mEq/L (ref 96–112)
Creatinine, Ser: 0.64 mg/dL (ref 0.40–1.20)
GFR: 85.06 mL/min (ref >60.00)
Glucose, Bld: 99 mg/dL (ref 70–99)
Potassium: 4.1 mEq/L (ref 3.5–5.1)
Sodium: 135 mEq/L (ref 135–145)

## 2020-10-23 LAB — TSH: TSH: 3.15 u[IU]/mL (ref 0.35–4.50)

## 2020-10-23 LAB — HEMOGLOBIN A1C: Hgb A1c MFr Bld: 6 % (ref 4.6–6.5)

## 2020-10-23 LAB — T4, FREE: Free T4: 1.21 ng/dL (ref 0.60–1.60)

## 2020-10-23 LAB — VITAMIN D 25 HYDROXY (VIT D DEFICIENCY, FRACTURES): VITD: 39.32 ng/mL (ref 30.00–100.00)

## 2020-10-24 ENCOUNTER — Telehealth: Payer: Self-pay | Admitting: Cardiovascular Disease

## 2020-10-24 DIAGNOSIS — I2510 Atherosclerotic heart disease of native coronary artery without angina pectoris: Secondary | ICD-10-CM | POA: Diagnosis not present

## 2020-10-24 DIAGNOSIS — E039 Hypothyroidism, unspecified: Secondary | ICD-10-CM | POA: Diagnosis not present

## 2020-10-24 DIAGNOSIS — E782 Mixed hyperlipidemia: Secondary | ICD-10-CM | POA: Diagnosis not present

## 2020-10-24 DIAGNOSIS — I10 Essential (primary) hypertension: Secondary | ICD-10-CM | POA: Diagnosis not present

## 2020-10-24 NOTE — Telephone Encounter (Signed)
Dr. Gwenlyn Found   This patient is to undergo a right index finger cyst removal with reconstruction. Surgery team is asking for Plavix holding recommendations. She has a hx of CAD with PCI/DES to pLAD in 2013.   Please send your recommendations to the preop pool   Thank you

## 2020-10-24 NOTE — Telephone Encounter (Signed)
   [facility] Pre-operative Risk Assessment    Patient Name: Brittany Figueroa  DOB: [birth year]  MRN: [id number]    Request for surgical clearance:  1. What type of surgery is being performed? Right index cyst removal & repair reconstruction   2. When is this surgery scheduled? 11/05/2020   3. What type of clearance is required (medical clearance vs. Pharmacy clearance to hold med vs. Both)? Both   4. Are there any medications that need to be held prior to surgery and how long? plavix    5. Practice name and name of physician performing surgery? Dr. Roseanne Kaufman with [facility]   6. What is the office phone number? [phone]   7.   What is the office fax number? [phone] (attn: Orson Slick)  8.   Anesthesia type (None, local, MAC, general) ? Not specified    Fidel Levy 10/24/2020, 2:55 PM  _________________________________________________________________   (provider comments below)

## 2020-10-26 ENCOUNTER — Encounter: Payer: Self-pay | Admitting: Endocrinology

## 2020-10-26 ENCOUNTER — Other Ambulatory Visit: Payer: Self-pay

## 2020-10-26 ENCOUNTER — Ambulatory Visit (INDEPENDENT_AMBULATORY_CARE_PROVIDER_SITE_OTHER): Payer: Medicare Other | Admitting: Endocrinology

## 2020-10-26 VITALS — BP 122/64 | HR 81 | Ht 61.0 in | Wt 140.0 lb

## 2020-10-26 DIAGNOSIS — R7303 Prediabetes: Secondary | ICD-10-CM

## 2020-10-26 DIAGNOSIS — I10 Essential (primary) hypertension: Secondary | ICD-10-CM | POA: Diagnosis not present

## 2020-10-26 DIAGNOSIS — E559 Vitamin D deficiency, unspecified: Secondary | ICD-10-CM

## 2020-10-26 DIAGNOSIS — E039 Hypothyroidism, unspecified: Secondary | ICD-10-CM | POA: Diagnosis not present

## 2020-10-26 MED ORDER — LEVOTHYROXINE SODIUM 25 MCG PO TABS: ORAL_TABLET | ORAL | 1 refills | Status: DC

## 2020-10-26 NOTE — Patient Instructions (Signed)
Check blood sugars on waking up 2 days a week  Also check blood sugars about 2 hours after meals and do this after different meals by rotation  Recommended blood sugar levels on waking up are 80-120 and about 2 hours after meal is 130-160  Please bring your blood sugar monitor to each visit, thank you

## 2020-10-26 NOTE — Progress Notes (Signed)
Patient ID: Brittany Figueroa, female   DOB: 12-11-1940, 80 y.o.   MRN: [id number]           Chief complaint: Endocrinology follow-up    History of Present Illness:  PREVIOUS history: She may have been told about 10 years ago that she had prediabetes and reportedly had a normal glucose tolerance test. She was told by her PCP that she has type 2 diabetes based on an A1c of 6.5% in 07/2013 She has not had any other A1c is subsequently above normal, normal range and previous lab 6.2 or less She also has not had any abnormal blood sugars except a glucose of 125 in 3/16  Has history of increased fasting blood sugars ranging from 108-125 since about 2012  GLUCOSE TOLERANCE test on 05/23/15: Fasting glucose 110, two-hour glucose 192  RECENT history:   She has impaired fasting glucose and glucose intolerance  She has been intermittently on metformin ER since about 08/2015 In 1/18 when A1c was 6.5 and glucose 133 fasting she was told to start back on metformin and also improve her diet She was taking 1500 mg a day but could not tolerate this because of feeling of bloating  Current regimen: Metformin 500 mg 2 tablets a day in pm  A1c is 6.0 again, previously 6.7  Current blood sugar data, management and history    She is reporting weight gain, recently, however her weight is the same as in March Previously had higher sugars causing increased A1c because of intra-articular steroid injections She generally fairly consistent with watching her diet and carbohydrate Has had some difficulty with consistent exercise because of musculoskeletal issues and foot pain Lowest A1c last year was 5.9  she is only checking blood sugars in the mornings and rarely after lunch and highest blood sugar at home is 111 fasting, otherwise below 100 Lab fasting glucose was 99 Tolerating Metformin 500 but takes both tablets at dinnertime daily, no nausea with this   Using freestyle neo monitor   Weight history:  Previous range 139-143  Wt Readings from Last 3 Encounters:  10/26/20 140 lb (63.5 kg)  07/20/20 140 lb (63.5 kg)  04/06/20 137 lb 12.8 oz (62.5 kg)    Lab Results  Component Value Date   HGBA1C 6.0 10/23/2020   HGBA1C 6.0 07/17/2020   HGBA1C 6.7 (H) 04/03/2020   Lab Results  Component Value Date   MICROALBUR <0.7 07/17/2020   LDLCALC 83 08/22/2020   CREATININE 0.64 10/23/2020   HYPOTHYROIDISM and other problems reviewed today: See review of systems    Lab on 10/23/2020  Component Date Value Ref Range Status   VITD 10/23/2020 39.32  30.00 - 100.00 ng/mL Final   Free T4 10/23/2020 1.21  0.60 - 1.60 ng/dL Final   Comment: Specimens from patients who are undergoing biotin therapy and /or ingesting biotin supplements may contain high levels of biotin.  The higher biotin concentration in these specimens interferes with this Free T4 assay.  Specimens that contain high levels  of biotin may cause false high results for this Free T4 assay.  Please interpret results in light of the total clinical presentation of the patient.     TSH 10/23/2020 3.15  0.35 - 4.50 uIU/mL Final   Sodium 10/23/2020 135  135 - 145 mEq/L Final   Potassium 10/23/2020 4.1  3.5 - 5.1 mEq/L Final   Chloride 10/23/2020 101  96 - 112 mEq/L Final   CO2 10/23/2020 22  19 - 32 mEq/L  Final   Glucose, Bld 10/23/2020 99  70 - 99 mg/dL Final   BUN 10/23/2020 12  6 - 23 mg/dL Final   Creatinine, Ser 10/23/2020 0.64  0.40 - 1.20 mg/dL Final   GFR 10/23/2020 85.06  >60.00 mL/min Final   Calculated using the CKD-EPI Creatinine Equation (2021)   Calcium 10/23/2020 9.6  8.4 - 10.5 mg/dL Final   Hgb A1c MFr Bld 10/23/2020 6.0  4.6 - 6.5 % Final   Glycemic Control Guidelines for People with Diabetes:Non Diabetic:  <6%Goal of Therapy: <7%Additional Action Suggested:  >8%       Past Medical History:  Diagnosis Date   Abnormal finding on EKG, new anterolateral T-wave inversions  05/14/2012   Abnormal nuclear stress test,  with infero-lateral ischemia 05/14/2012   Arthritis    "right thumb; all my fingers" (05/13/2012), cervical spondylosis    Atypical angina ([place]) 05/14/2012   CAD (coronary artery disease), 05/13/12, with 80% LAD 05/14/2012   Cancer ([place])    basal cell on facial in L temporal region     [facility] arthritis, thumb, degenerative    Complication of anesthesia 12/2009   "had endoscopy; larynx went into spasms; stopped breathing for 15 seconds" (05/13/2012)   GERD (gastroesophageal reflux disease) 2011   Hypercholesteremia    Hypertension    S/P angioplasty with stent, LAD 05/13/12 05/14/2012   Tuberculosis    test +- [place] [facility] - Dr. [name]- CXR- OK    Past Surgical History:  Procedure Laterality Date   ANTERIOR CERVICAL DECOMP/DISCECTOMY FUSION N/A 12/13/2013   Procedure: ANTERIOR CERVICAL DECOMPRESSION/DISCECTOMY FUSION 3 LEVELS Cervical four/five,five/six,six/seven. Anterior cervical disectomy and fusion with peek and plate ;  Surgeon: [name], MD;  Location: [place] [facility];  Service: Neurosurgery;  Laterality: N/A;   CARDIAC CATHETERIZATION N/A 07/02/2015   Procedure: Left Heart Cath and Coronary Angiography;  Surgeon: [name], MD;  Location: [place] CV LAB;  Service: Cardiovascular;  Laterality: N/A;   CORONARY ANGIOPLASTY WITH STENT PLACEMENT  05/13/2012   DES to LAD; she has total RCA with left to rt coll. normal LV function done for positive nuc study   DILATION AND CURETTAGE OF UTERUS  1960's; 1970's; 1980   "probably 3" (05/13/2012)   FOREARM / WRIST TENDON LESION EXCISION  ?11/2001   "right; did waver to try to get ulnar out of hole that it had cut" (05/13/2012   INGUINAL HERNIA REPAIR  ~ 1966; 07/21/2002   "right; left" (05/13/2012)   LEFT HEART CATHETERIZATION WITH CORONARY ANGIOGRAM N/A 05/13/2012   Procedure: LEFT HEART CATHETERIZATION WITH CORONARY ANGIOGRAM;  Surgeon: [name], MD;  Location: [facility] CATH LAB;  Service: Cardiovascular;  Laterality: N/A;    OSTEOTOMY AND ULNAR SHORTENING  07/21/2002   "right" (05/13/2012)   TONSILLECTOMY AND ADENOIDECTOMY  1950's   TUBAL LIGATION  1980    Family History  Problem Relation Age of Onset   Hypertension Mother    Diabetes Mother    Hyperlipidemia Father    Heart disease Father    Stroke Father    Heart disease Sister    Diabetes Sister    Heart disease Brother    Diabetes Maternal Aunt     Social History:  reports that she quit smoking about 32 years ago. Her smoking use included cigarettes. She has a 10.00 pack-year smoking history. She has never used smokeless tobacco. She reports current alcohol use of about 12.0 standard drinks of alcohol per week. She reports that she does  not use drugs.  Allergies:  Allergies  Allergen Reactions   Statins Other (See Comments)    "intermittent loss of circulation; hands and arms will go to sleep; feet will get cramps; fatigue" (05/13/2012).  This occurred with Lipitor, Zocor, Crestor 5 mg qd, and she thinks pravastatin as well   Amlodipine Itching   Gadolinium Derivatives Itching and Nausea Only    Pt had severe nausea and itching on legs, neck and back, per dr [name] pt needs 13 hour prep before contrast in the future    Allergies as of 10/26/2020       Reactions   Statins Other (See Comments)   "intermittent loss of circulation; hands and arms will go to sleep; feet will get cramps; fatigue" (05/13/2012).  This occurred with Lipitor, Zocor, Crestor 5 mg qd, and she thinks pravastatin as well   Amlodipine Itching   Gadolinium Derivatives Itching, Nausea Only   Pt had severe nausea and itching on legs, neck and back, per dr [name] pt needs 13 hour prep before contrast in the future        Medication List        Accurate as of October 26, 2020 10:07 AM. If you have any questions, ask your nurse or doctor.          acetaminophen 500 MG tablet Commonly known as: TYLENOL Take 500 mg by mouth every 6 (six) hours as needed for mild pain or  headache.   aspirin 81 MG tablet Take 81 mg by mouth daily.   calcium-vitamin D 500-200 MG-UNIT tablet Commonly known as: OSCAL WITH D Take 1 tablet by mouth daily as needed.   carvedilol 12.5 MG tablet Commonly known as: COREG Take 12.5 mg by mouth 2 (two) times daily with a meal.   clopidogrel 75 MG tablet Commonly known as: PLAVIX TAKE 1 TABLET(75 MG) BY MOUTH DAILY   diclofenac sodium 1 % Gel Commonly known as: VOLTAREN Apply 2 g topically daily as needed. For pain   Dilt-XR 240 MG 24 hr capsule Generic drug: diltiazem Take 240 mg by mouth daily.   ezetimibe 10 MG tablet Commonly known as: ZETIA TAKE 1 TABLET(10 MG) BY MOUTH DAILY   fenofibrate 160 MG tablet TAKE 1 TABLET(160 MG) BY MOUTH DAILY What changed: See the new instructions.   glucose blood test strip Commonly known as: OneTouch Verio Use to test blood sugar once daily Dx code E11.9   hydrocortisone valerate cream 0.2 % Commonly known as: WESTCORT Apply 1 application topically daily as needed (ITCHING).   irbesartan 300 MG tablet Commonly known as: AVAPRO TAKE 1/2 TABLET BY MOUTH TWICE DAILY( IN THE MORNING AND AT BEDTIME)   levothyroxine 25 MCG tablet Commonly known as: SYNTHROID TAKE 1 TABLET(25 MCG) BY MOUTH DAILY BEFORE BREAKFAST What changed: See the new instructions.   metFORMIN 500 MG tablet Commonly known as: GLUCOPHAGE Take 1,000 mg by mouth daily with breakfast.   multivitamin with minerals Tabs tablet Take 1 tablet by mouth daily.   nitroGLYCERIN 0.4 MG SL tablet Commonly known as: NITROSTAT PLACE 1 TABLET UNDER THE TONGUE EVERY 5 MINUTES AS NEEDED FOR CHEST PAIN   omega-3 acid ethyl esters 1 g capsule Commonly known as: LOVAZA Take 2 capsules (2 g total) by mouth 2 (two) times daily.   PRESERVISION AREDS 2+MULTI VIT PO Take 1 tablet by mouth in the morning and at bedtime.   Repatha SureClick 527 MG/ML Soaj Generic drug: Evolocumab INJECT 1 PEN UNDER THE SKIN EVERY  14  DAYS   triamterene-hydrochlorothiazide 37.5-25 MG tablet Commonly known as: MAXZIDE-25 Take 1 tablet by mouth daily. Patient taking  1/4 tablet   Vitamin D3 50 MCG (2000 UT) Tabs Take 2,000 Units by mouth 2 (two) times daily.        LABS:  Lab on 10/23/2020  Component Date Value Ref Range Status   VITD 10/23/2020 39.32  30.00 - 100.00 ng/mL Final   Free T4 10/23/2020 1.21  0.60 - 1.60 ng/dL Final   Comment: Specimens from patients who are undergoing biotin therapy and /or ingesting biotin supplements may contain high levels of biotin.  The higher biotin concentration in these specimens interferes with this Free T4 assay.  Specimens that contain high levels  of biotin may cause false high results for this Free T4 assay.  Please interpret results in light of the total clinical presentation of the patient.     TSH 10/23/2020 3.15  0.35 - 4.50 uIU/mL Final   Sodium 10/23/2020 135  135 - 145 mEq/L Final   Potassium 10/23/2020 4.1  3.5 - 5.1 mEq/L Final   Chloride 10/23/2020 101  96 - 112 mEq/L Final   CO2 10/23/2020 22  19 - 32 mEq/L Final   Glucose, Bld 10/23/2020 99  70 - 99 mg/dL Final   BUN 10/23/2020 12  6 - 23 mg/dL Final   Creatinine, Ser 10/23/2020 0.64  0.40 - 1.20 mg/dL Final   GFR 10/23/2020 85.06  >60.00 mL/min Final   Calculated using the CKD-EPI Creatinine Equation (2021)   Calcium 10/23/2020 9.6  8.4 - 10.5 mg/dL Final   Hgb A1c MFr Bld 10/23/2020 6.0  4.6 - 6.5 % Final   Glycemic Control Guidelines for People with Diabetes:Non Diabetic:  <6%Goal of Therapy: <7%Additional Action Suggested:  >8%       Review of Systems    OSTEOPENIA:   Lowest T score is at the right neck femur previously -2.4  This is now only 0.8 as of 11/14/2019  However BMD at the left femoral neck is last -2.0 compared to -1.8  Apparently had tried Fosamax in the past and this caused some GI side effects She received RECLAST infusion on 12/15/17 and again on 01/07/2020 She will have her  next infusion in 12/2021  Vitamin D deficiency: She is taking OTC vitamin D 4000 U daily  Recent vitamin D level is 39.3  HYPERTENSION: Treated with Avapro, diltiazem, maxzide quarter tablet and Coreg, Followed by PCP.  Home BP has been checked regularly also  History of hyperlipidemia with some intolerance to statins, on Repatha and also on Zetia.   Has high triglycerides, apparently previously having muscle aches and was told to stop fenofibrate but now she is trying half a tablet daily, she does not think this is causing muscle cramps now   Last fasting lipids as follows  Lab Results  Component Value Date   CHOL 169 08/22/2020   HDL 47 08/22/2020   LDLCALC 83 08/22/2020   LDLDIRECT 130.1 04/04/2014   TRIG 236 (H) 08/22/2020   CHOLHDL 3.6 08/22/2020      HYPOTHYROIDISM: TSH level previously has been as high as 5.6  This spontaneously improved but in 05/2018 was mildly increased again  At that time she was complaining of some fatigue, constipation, dry skin, some cold intolerance and difficulty losing weight.  She is taking levothyroxine 37.5 mcg daily, previously had some improvement in her fatigue with increasing her dose   Her TSH is back to normal,  previously at 4.9  Lab Results  Component Value Date   TSH 3.15 10/23/2020   TSH 4.92 (H) 07/17/2020   TSH 4.47 04/03/2020   FREET4 1.21 10/23/2020   FREET4 1.08 07/17/2020   FREET4 1.13 04/03/2020     PHYSICAL EXAM:  BP 122/64 (BP Location: Left Arm, Patient Position: Sitting, Cuff Size: Large)   Pulse 81   Ht 5\' 1"  (1.549 m)   Wt 140 lb (63.5 kg)   SpO2 99%   BMI 26.45 kg/m      ASSESSMENT:   Mild diabetes with impaired fasting glucose and also impaired glucose tolerance  Most of her A1c and blood sugars in the past have been in the prediabetic range although she has had high blood sugars over 125 and A1c up to 6.7 partly from steroids  Her A1c is now consistently controlled at 6%  She is on 1000 mg of  Metformin at dinnertime which she is tolerating Home blood sugars are generally below 100 and only once higher at 111 She has only 1 nonfasting reading of 83 Weight is about the same as in March   HYPOTHYROIDISM: TSH is back to normal with increasing her dose of levothyroxine up to 37.5 mcg She may have had some improvement in her fatigue with increasing the dose  Hypertriglyceridemia: She is not taking the full dose of fenofibrate and triglycerides have been over 200   PLAN:   Continue 1000 mg Metformin at dinnertime Encourage her to exercise with walking and she can think she can try to walk about 10 minutes at a time   She will continue 1-1/2 tablets of her levothyroxine 25 mcg for hypothyroidism   She will continue 4000 units of vitamin D supplement and calcium for osteopenia Reclast will be infused next year  For her lipids she can try taking the full tablet of fenofibrate again to see if she can tolerate it and follow-up with cardiologist as scheduled  Follow-up in  4 months     Torrian Canion 10/26/2020, 10:07 AM

## 2020-10-29 DIAGNOSIS — E785 Hyperlipidemia, unspecified: Secondary | ICD-10-CM | POA: Diagnosis not present

## 2020-10-29 LAB — LIPID PANEL
Chol/HDL Ratio: 3.4 ratio (ref 0.0–4.4)
Cholesterol, Total: 162 mg/dL (ref 100–199)
HDL: 47 mg/dL (ref >39)
LDL Chol Calc (NIH): 76 mg/dL (ref 0–99)
Triglycerides: 239 mg/dL — ABNORMAL HIGH (ref 0–149)
VLDL Cholesterol Cal: 39 mg/dL (ref 5–40)

## 2020-10-29 NOTE — Telephone Encounter (Signed)
    MIRISSA LOPRESTI DOB:  Apr 23, 1941  MRN:  [id number]   Primary Cardiologist: None  Chart reviewed as part of pre-operative protocol coverage. Given past medical history and time since last visit, based on ACC/AHA guidelines, COURTNEI RUDDELL would be at acceptable risk for the planned procedure without further cardiovascular testing.   Per Dr. Gwenlyn Found, the patient may hold Plavix prior to procedure then resume once ok from a surgical standpoint thereafter.   I will route this recommendation to the requesting party via Epic fax function and remove from pre-op pool.  Please call with questions.  Kathyrn Drown, NP 10/29/2020, 7:52 AM

## 2020-10-31 ENCOUNTER — Encounter (INDEPENDENT_AMBULATORY_CARE_PROVIDER_SITE_OTHER): Payer: Medicare Other | Admitting: Ophthalmology

## 2020-11-05 DIAGNOSIS — M67441 Ganglion, right hand: Secondary | ICD-10-CM | POA: Diagnosis not present

## 2020-11-08 ENCOUNTER — Ambulatory Visit (INDEPENDENT_AMBULATORY_CARE_PROVIDER_SITE_OTHER): Payer: Medicare Other | Admitting: Ophthalmology

## 2020-11-08 ENCOUNTER — Encounter (INDEPENDENT_AMBULATORY_CARE_PROVIDER_SITE_OTHER): Payer: Self-pay | Admitting: Ophthalmology

## 2020-11-08 ENCOUNTER — Other Ambulatory Visit: Payer: Self-pay

## 2020-11-08 DIAGNOSIS — H353211 Exudative age-related macular degeneration, right eye, with active choroidal neovascularization: Secondary | ICD-10-CM

## 2020-11-08 DIAGNOSIS — H353132 Nonexudative age-related macular degeneration, bilateral, intermediate dry stage: Secondary | ICD-10-CM | POA: Diagnosis not present

## 2020-11-08 NOTE — Assessment & Plan Note (Signed)
No signs of CNVM by OCT OS

## 2020-11-08 NOTE — Assessment & Plan Note (Signed)
Improved overall at 7-week follow-up.  Post injection Avastin.  Planned injection today yet patient had recent hand, joint surgery, just 4 days previous.  I will suggest holding off on repeat injection for 2 more weeks in the right eye to allow the hand to begin its healing process

## 2020-11-08 NOTE — Progress Notes (Signed)
11/08/2020     CHIEF COMPLAINT Patient presents for Retina Follow Up (6 week fu OD and Avastin OD/Pt states, "I have been having some irritation in OD since last injection. But my vision seems the same."/A1C: 6.0/LBS: 94)   HISTORY OF PRESENT ILLNESS: Brittany Figueroa is a 80 y.o. female who presents to the clinic today for:   HPI     Retina Follow Up           Diagnosis: Wet AMD   Laterality: right eye   Onset: 6   Severity: mild   Duration: 6 weeks   Course: stable   Comments: 6 week fu OD and Avastin OD Pt states, "I have been having some irritation in OD since last injection. But my vision seems the same." A1C: 6.0 LBS: 94       Last edited by Kendra Opitz, COA on 11/08/2020  3:06 PM.      Referring physician: Lajean Manes, MD [address] [place],  [place] [address]  HISTORICAL INFORMATION:   Selected notes from the MEDICAL RECORD NUMBER    Lab Results  Component Value Date   HGBA1C 6.0 10/23/2020     CURRENT MEDICATIONS: No current outpatient medications on file. (Ophthalmic Drugs)   No current facility-administered medications for this visit. (Ophthalmic Drugs)   Current Outpatient Medications (Other)  Medication Sig   acetaminophen (TYLENOL) 500 MG tablet Take 500 mg by mouth every 6 (six) hours as needed for mild pain or headache.   aspirin 81 MG tablet Take 81 mg by mouth daily.   calcium-vitamin D (OSCAL WITH D) 500-200 MG-UNIT per tablet Take 1 tablet by mouth daily as needed.   carvedilol (COREG) 12.5 MG tablet Take 12.5 mg by mouth 2 (two) times daily with a meal.   Cholecalciferol (VITAMIN D3) 2000 UNITS TABS Take 2,000 Units by mouth 2 (two) times daily.   clopidogrel (PLAVIX) 75 MG tablet TAKE 1 TABLET(75 MG) BY MOUTH DAILY   diclofenac sodium (VOLTAREN) 1 % GEL Apply 2 g topically daily as needed. For pain   DILT-XR 240 MG 24 hr capsule Take 240 mg by mouth daily.   ezetimibe (ZETIA) 10 MG tablet TAKE 1 TABLET(10 MG) BY  MOUTH DAILY   fenofibrate 160 MG tablet TAKE 1 TABLET(160 MG) BY MOUTH DAILY (Patient taking differently: Take 80 mg by mouth daily.)   glucose blood (ONETOUCH VERIO) test strip Use to test blood sugar once daily Dx code E11.9   hydrocortisone valerate cream (WESTCORT) 0.2 % Apply 1 application topically daily as needed (ITCHING).   irbesartan (AVAPRO) 300 MG tablet TAKE 1/2 TABLET BY MOUTH TWICE DAILY( IN THE MORNING AND AT BEDTIME)   levothyroxine (SYNTHROID) 25 MCG tablet TAKE 1 1/2 TABLET BY MOUTH DAILY BEFORE BREAKFAST   metFORMIN (GLUCOPHAGE) 500 MG tablet Take 1,000 mg by mouth daily with breakfast.   Multiple Vitamin (MULTIVITAMIN WITH MINERALS) TABS Take 1 tablet by mouth daily.   Multiple Vitamins-Minerals (PRESERVISION AREDS 2+MULTI VIT PO) Take 1 tablet by mouth in the morning and at bedtime.   nitroGLYCERIN (NITROSTAT) 0.4 MG SL tablet PLACE 1 TABLET UNDER THE TONGUE EVERY 5 MINUTES AS NEEDED FOR CHEST PAIN   omega-3 acid ethyl esters (LOVAZA) 1 g capsule Take 2 capsules (2 g total) by mouth 2 (two) times daily.   REPATHA SURECLICK 315 MG/ML SOAJ INJECT 1 PEN UNDER THE SKIN EVERY 14 DAYS   triamterene-hydrochlorothiazide (MAXZIDE-25) 37.5-25 MG tablet Take 1 tablet  by mouth daily. Patient taking  1/4 tablet   No current facility-administered medications for this visit. (Other)      REVIEW OF SYSTEMS:    ALLERGIES Allergies  Allergen Reactions   Statins Other (See Comments)    "intermittent loss of circulation; hands and arms will go to sleep; feet will get cramps; fatigue" (05/13/2012).  This occurred with Lipitor, Zocor, Crestor 5 mg qd, and she thinks pravastatin as well   Amlodipine Itching   Gadolinium Derivatives Itching and Nausea Only    Pt had severe nausea and itching on legs, neck and back, per dr [name] pt needs 13 hour prep before contrast in the future    PAST MEDICAL HISTORY Past Medical History:  Diagnosis Date   Abnormal finding on EKG, new  anterolateral T-wave inversions  05/14/2012   Abnormal nuclear stress test, with infero-lateral ischemia 05/14/2012   Arthritis    "right thumb; all my fingers" (05/13/2012), cervical spondylosis    Atypical angina ([place]) 05/14/2012   CAD (coronary artery disease), 05/13/12, with 80% LAD 05/14/2012   Cancer ([place])    basal cell on facial in L temporal region     [facility] arthritis, thumb, degenerative    Complication of anesthesia 12/2009   "had endoscopy; larynx went into spasms; stopped breathing for 15 seconds" (05/13/2012)   GERD (gastroesophageal reflux disease) 2011   Hypercholesteremia    Hypertension    S/P angioplasty with stent, LAD 05/13/12 05/14/2012   Tuberculosis    test +- [place] [facility] - Dr. [name]- CXR- OK   Past Surgical History:  Procedure Laterality Date   ANTERIOR CERVICAL DECOMP/DISCECTOMY FUSION N/A 12/13/2013   Procedure: ANTERIOR CERVICAL DECOMPRESSION/DISCECTOMY FUSION 3 LEVELS Cervical four/five,five/six,six/seven. Anterior cervical disectomy and fusion with peek and plate ;  Surgeon: [name], MD;  Location: [facility] NEURO ORS;  Service: Neurosurgery;  Laterality: N/A;   CARDIAC CATHETERIZATION N/A 07/02/2015   Procedure: Left Heart Cath and Coronary Angiography;  Surgeon: [name], MD;  Location: [facility] LAB;  Service: Cardiovascular;  Laterality: N/A;   CORONARY ANGIOPLASTY WITH STENT PLACEMENT  05/13/2012   DES to LAD; she has total RCA with left to rt coll. normal LV function done for positive nuc study   DILATION AND CURETTAGE OF UTERUS  1960's; 1970's; 1980   "probably 3" (05/13/2012)   FOREARM / WRIST TENDON LESION EXCISION  ?11/2001   "right; did waver to try to get ulnar out of hole that it had cut" (05/13/2012   INGUINAL HERNIA REPAIR  ~ 1966; 07/21/2002   "right; left" (05/13/2012)   LEFT HEART CATHETERIZATION WITH CORONARY ANGIOGRAM N/A 05/13/2012   Procedure: LEFT HEART CATHETERIZATION WITH CORONARY ANGIOGRAM;  Surgeon: [name], MD;   Location: [facility] CATH LAB;  Service: Cardiovascular;  Laterality: N/A;   OSTEOTOMY AND ULNAR SHORTENING  07/21/2002   "right" (05/13/2012)   TONSILLECTOMY AND ADENOIDECTOMY  1950's   TUBAL LIGATION  1980    FAMILY HISTORY Family History  Problem Relation Age of Onset   Hypertension Mother    Diabetes Mother    Hyperlipidemia Father    Heart disease Father    Stroke Father    Heart disease Sister    Diabetes Sister    Heart disease Brother    Diabetes Maternal Aunt     SOCIAL HISTORY Social History   Tobacco Use   Smoking status: Former    Packs/day: 0.50    Years: 20.00    Pack years: 10.00  Types: Cigarettes    Quit date: 07/05/1988    Years since quitting: 32.3   Smokeless tobacco: Never  Substance Use Topics   Alcohol use: Yes    Alcohol/week: 12.0 standard drinks    Types: 12 Glasses of wine per week    Comment: 05/13/2012 "6-8 oz glass of red wine q hs"    Drug use: No         OPHTHALMIC EXAM:  Base Eye Exam     Visual Acuity (ETDRS)       Right Left   Dist cc 20/25 -2 20/25 -1         Tonometry (Tonopen, 3:11 PM)       Right Left   Pressure 13 15         Pupils       Pupils Dark Light Shape React APD   Right PERRL 4 3 Round Brisk None   Left PERRL 4 3 Round Brisk None         Visual Fields (Counting fingers)       Left Right    Full Full         Extraocular Movement       Right Left    Full Full         Neuro/Psych     Oriented x3: Yes   Mood/Affect: Normal         Dilation     Right eye: 1.0% Mydriacyl, 2.5% Phenylephrine @ 3:11 PM           Slit Lamp and Fundus Exam     External Exam       Right Left   External Normal Normal         Slit Lamp Exam       Right Left   Lids/Lashes Normal Normal   Conjunctiva/Sclera White and quiet White and quiet   Cornea Clear Clear   Anterior Chamber Deep and quiet Deep and quiet   Iris Round and reactive Round and reactive   Lens 2+ Nuclear sclerosis 2+  Nuclear sclerosis   Anterior Vitreous Normal, no cells Normal         Fundus Exam       Right Left   Posterior Vitreous Normal,  no cells    Disc Normal    C/D Ratio 0.45    Macula Hard drusen, Microaneurysms, no macular thickening    Vessels Normal    Periphery Normal             IMAGING AND PROCEDURES  Imaging and Procedures for 11/08/20  OCT, Retina - OU - Both Eyes       Right Eye Quality was good. Central Foveal Thickness: 263. Progression has improved. Findings include abnormal foveal contour, no SRF, no IRF.   Left Eye Quality was good. Scan locations included subfoveal. Central Foveal Thickness: 276. Progression has been stable. Findings include abnormal foveal contour.   Notes OD, much improved anatomy overall as compared to February 2022 onset of CNVM with intraretinal and subretinal fluid.  No signs of recurrences.  Currently at 7-week interval for planned injection today yet with recent hand surgery disclosed today by the patient only 4 days previous I do not recommend intravitreal Avastin because of its delayed healing effects               ASSESSMENT/PLAN:  Exudative age-related macular degeneration of right eye with active choroidal neovascularization ([place]) Improved overall at 7-week follow-up.  Post injection Avastin.  Planned injection today yet patient had recent hand, joint surgery, just 4 days previous.  I will suggest holding off on repeat injection for 2 more weeks in the right eye to allow the hand to begin its healing process  Intermediate stage nonexudative age-related macular degeneration of both eyes No signs of CNVM by OCT OS     ICD-10-CM   1. Exudative age-related macular degeneration of right eye with active choroidal neovascularization (HCC)  H35.3211 OCT, Retina - OU - Both Eyes    2. Intermediate stage nonexudative age-related macular degeneration of both eyes  H35.3132       1.  For planned injection repeat today at  extended interval 7 weeks post treatment of wet AMD OD with Avastin.  Nonetheless Avastin does have delaying of healing effects and distant systemic areas and with recent hand surgery just 4 days previous I will recommend delay next visit to 2 to 3 weeks from now consideration Avastin OCT at that time OD  2.  Dilate OD next in 2 to 3 weeks  3.  Ophthalmic Meds Ordered this visit:  No orders of the defined types were placed in this encounter.      Return in about 2 weeks (around 11/22/2020) for dilate, OD 2-3 weeks, OD, AVASTIN OCT.  There are no Patient Instructions on file for this visit.   Explained the diagnoses, plan, and follow up with the patient and they expressed understanding.  Patient expressed understanding of the importance of proper follow up care.   Clent Demark Laraina Sulton M.D. Diseases & Surgery of the Retina and Vitreous [facility] [place] 11/08/20     Abbreviations: M myopia (nearsighted); A astigmatism; H hyperopia (farsighted); P presbyopia; Mrx spectacle prescription;  CTL contact lenses; OD right eye; OS left eye; OU both eyes  XT exotropia; ET esotropia; PEK punctate epithelial keratitis; PEE punctate epithelial erosions; DES dry eye syndrome; MGD meibomian gland dysfunction; ATs artificial tears; PFAT's preservative free artificial tears; [place] nuclear sclerotic cataract; PSC posterior subcapsular cataract; ERM epi-retinal membrane; PVD posterior vitreous detachment; RD retinal detachment; DM diabetes mellitus; DR diabetic retinopathy; NPDR non-proliferative diabetic retinopathy; PDR proliferative diabetic retinopathy; CSME clinically significant macular edema; DME diabetic macular edema; dbh dot blot hemorrhages; CWS cotton wool spot; POAG primary open angle glaucoma; C/D cup-to-disc ratio; HVF humphrey visual field; GVF goldmann visual field; OCT optical coherence tomography; IOP intraocular pressure; BRVO Branch retinal vein occlusion; CRVO central retinal vein  occlusion; CRAO central retinal artery occlusion; BRAO branch retinal artery occlusion; RT retinal tear; SB scleral buckle; PPV pars plana vitrectomy; VH Vitreous hemorrhage; PRP panretinal laser photocoagulation; IVK intravitreal kenalog; VMT vitreomacular traction; MH Macular hole;  NVD neovascularization of the disc; NVE neovascularization elsewhere; AREDS age related eye disease study; ARMD age related macular degeneration; POAG primary open angle glaucoma; EBMD epithelial/anterior basement membrane dystrophy; ACIOL anterior chamber intraocular lens; IOL intraocular lens; PCIOL posterior chamber intraocular lens; Phaco/IOL phacoemulsification with intraocular lens placement; [address] photorefractive keratectomy; LASIK laser assisted in situ keratomileusis; HTN hypertension; DM diabetes mellitus; COPD chronic obstructive pulmonary disease

## 2020-11-14 ENCOUNTER — Encounter: Payer: Self-pay | Admitting: Cardiovascular Disease

## 2020-11-14 ENCOUNTER — Ambulatory Visit: Payer: Medicare Other | Admitting: Cardiovascular Disease

## 2020-11-14 ENCOUNTER — Other Ambulatory Visit: Payer: Self-pay

## 2020-11-14 VITALS — BP 154/78 | HR 74 | Ht 61.0 in | Wt 136.0 lb

## 2020-11-14 DIAGNOSIS — E785 Hyperlipidemia, unspecified: Secondary | ICD-10-CM | POA: Diagnosis not present

## 2020-11-14 DIAGNOSIS — I2510 Atherosclerotic heart disease of native coronary artery without angina pectoris: Secondary | ICD-10-CM | POA: Diagnosis not present

## 2020-11-14 DIAGNOSIS — I10 Essential (primary) hypertension: Secondary | ICD-10-CM

## 2020-11-14 DIAGNOSIS — Z95820 Peripheral vascular angioplasty status with implants and grafts: Secondary | ICD-10-CM

## 2020-11-14 NOTE — Progress Notes (Addendum)
05/28/2021 Brittany Figueroa   Oct 03, 1940  [id number]  Primary Physician Lajean Manes, MD Primary Cardiologist: [name] MD FACP, [place], [place], [place]  HPI:  Brittany Figueroa is a 80 y.o.    mildly overweight divorced Caucasian female, mother to 2, grandmother to 3 grandchildren, who I last saw in the office 02/28/2020. She has a history of hypertension and hyperlipidemia. I saw her in December of 2013 with new anterolateral T-wave inversion and a Myoview that showed inferolateral ischemia that was new compared to a prior study. She did have some unusual symptoms at that time. Based on this, I catheterized her on May 13, 2012, revealing an 80% proximal LAD lesion, which I stented using a drug-eluting stent, as well as a total dominant RCA with left to right collaterals and normal LV function. Since stenting her LAD, she is ultimately asymptomatic. Her other problems include hypertension and hyperlipidemia. I have reviewed her blood pressures, which were recorded during cardiac rehab, which were all normal. Her recent blood work revealed a total cholesterol of 161, LDL of 89 and HDL of 40.she denies chest pain or shortness of breath. She had a recent Myoview stress test performed several months ago that showed ischemia in the RCA territory but otherwise unremarkable is explainable by her known total dominant RCA with left to right collaterals. Chief complaint of excruciating neck pain thought to be related to cervical disc disease. She was evaluated by Dr. Because of her neck pain demonstrated cervical disc disease requiring fusion. Because of this she will need to undergo pharmacologic Myoview stress testing to risk stratify her. She underwent cervical discectomy by Dr. Deri Fuelling in 2016 with excellent clinical result. She no longer is in pain. Her major issue now is treatment of her hyperlipidemia. She is statin intolerant and complains of short-term memory loss.   She did contract COVID-19 in  July of last year but has recovered.  She is tolerating Repatha without side effects excellent lipid profile with LDL of 74 measured 03/14/2019.     Since I saw her 9 months ago she continues to do well.  Her blood pressure is under better control at home running in the 120/70 range.  She denies chest pain or shortness of breath.  She recently had hand surgery on one of her fingers by Dr. Amedeo Plenty.     Current Meds  Medication Sig   acetaminophen (TYLENOL) 500 MG tablet Take 500 mg by mouth every 6 (six) hours as needed for mild pain or headache.   aspirin 81 MG tablet Take 81 mg by mouth daily.   calcium-vitamin D (OSCAL WITH D) 500-200 MG-UNIT per tablet Take 1 tablet by mouth daily as needed.   carvedilol (COREG) 12.5 MG tablet Take 12.5 mg by mouth 2 (two) times daily with a meal.   Cholecalciferol (VITAMIN D3) 2000 UNITS TABS Take 2,000 Units by mouth 2 (two) times daily.   diclofenac sodium (VOLTAREN) 1 % GEL Apply 2 g topically daily as needed. For pain   DILT-XR 240 MG 24 hr capsule Take 240 mg by mouth daily.   glucose blood (ONETOUCH VERIO) test strip Use to test blood sugar once daily Dx code E11.9   hydrocortisone valerate cream (WESTCORT) 0.2 % Apply 1 application topically daily as needed (ITCHING).   Multiple Vitamin (MULTIVITAMIN WITH MINERALS) TABS Take 1 tablet by mouth daily.   Multiple Vitamins-Minerals (PRESERVISION AREDS 2+MULTI VIT PO) Take 1 tablet by mouth in the morning and  at bedtime.   nitroGLYCERIN (NITROSTAT) 0.4 MG SL tablet PLACE 1 TABLET UNDER THE TONGUE EVERY 5 MINUTES AS NEEDED FOR CHEST PAIN   omega-3 acid ethyl esters (LOVAZA) 1 g capsule Take 2 capsules (2 g total) by mouth 2 (two) times daily.   REPATHA SURECLICK 324 MG/ML SOAJ INJECT 1 PEN UNDER THE SKIN EVERY 14 DAYS   triamterene-hydrochlorothiazide (MAXZIDE-25) 37.5-25 MG tablet Take 1 tablet by mouth daily. Patient taking  1/4 tablet   [DISCONTINUED] clopidogrel (PLAVIX) 75 MG tablet TAKE 1 TABLET(75  MG) BY MOUTH DAILY   [DISCONTINUED] ezetimibe (ZETIA) 10 MG tablet TAKE 1 TABLET(10 MG) BY MOUTH DAILY   [DISCONTINUED] fenofibrate 160 MG tablet TAKE 1 TABLET(160 MG) BY MOUTH DAILY (Patient taking differently: Take 80 mg by mouth daily.)   [DISCONTINUED] irbesartan (AVAPRO) 300 MG tablet TAKE 1/2 TABLET BY MOUTH TWICE DAILY( IN THE MORNING AND AT BEDTIME)   [DISCONTINUED] levothyroxine (SYNTHROID) 25 MCG tablet TAKE 1 1/2 TABLET BY MOUTH DAILY BEFORE BREAKFAST   [DISCONTINUED] metFORMIN (GLUCOPHAGE) 500 MG tablet Take 1,000 mg by mouth daily with breakfast.     Allergies  Allergen Reactions   Statins Other (See Comments)    "intermittent loss of circulation; hands and arms will go to sleep; feet will get cramps; fatigue" (05/13/2012).  This occurred with Lipitor, Zocor, Crestor 5 mg qd, and she thinks pravastatin as well   Amlodipine Itching   Gadolinium Derivatives Itching and Nausea Only    Pt had severe nausea and itching on legs, neck and back, per dr [name] pt needs 13 hour prep before contrast in the future    Social History   Socioeconomic History   Marital status: Divorced    Spouse name: Not on file   Number of children: Not on file   Years of education: Not on file   Highest education level: Not on file  Occupational History   Not on file  Tobacco Use   Smoking status: Former    Packs/day: 0.50    Years: 20.00    Pack years: 10.00    Types: Cigarettes    Quit date: 07/05/1988    Years since quitting: 32.9   Smokeless tobacco: Never  Substance and Sexual Activity   Alcohol use: Yes    Alcohol/week: 12.0 standard drinks    Types: 12 Glasses of wine per week    Comment: 05/13/2012 "6-8 oz glass of red wine q hs"    Drug use: No   Sexual activity: Never  Other Topics Concern   Not on file  Social History Narrative   Not on file   Social Determinants of Health   Financial Resource Strain: Not on file  Food Insecurity: Not on file  Transportation Needs: Not  on file  Physical Activity: Not on file  Stress: Not on file  Social Connections: Not on file  Intimate Partner Violence: Not on file     Review of Systems: General: negative for chills, fever, night sweats or weight changes.  Cardiovascular: negative for chest pain, dyspnea on exertion, edema, orthopnea, palpitations, paroxysmal nocturnal dyspnea or shortness of breath Dermatological: negative for rash Respiratory: negative for cough or wheezing Urologic: negative for hematuria Abdominal: negative for nausea, vomiting, diarrhea, bright red blood per rectum, melena, or hematemesis Neurologic: negative for visual changes, syncope, or dizziness All other systems reviewed and are otherwise negative except as noted above.    Blood pressure (!) 154/78, pulse 74, height 5\' 1"  (1.549 m), weight 136 lb (61.7  kg), SpO2 99 %.  General appearance: alert and no distress Neck: no adenopathy, no carotid bruit, no JVD, supple, symmetrical, trachea midline, and thyroid not enlarged, symmetric, no tenderness/mass/nodules Lungs: clear to auscultation bilaterally Heart: regular rate and rhythm, S1, S2 normal, no murmur, click, rub or gallop Extremities: extremities normal, atraumatic, no cyanosis or edema Pulses: 2+ and symmetric Skin: Skin color, texture, turgor normal. No rashes or lesions Neurologic: Grossly normal  EKG sinus rhythm at 71 with septal Q waves and left anterior fascicular block.  I personally reviewed this EKG.  ASSESSMENT AND PLAN:   S/P angioplasty with stent, LAD 05/13/12 History of CAD s/p catheterization which I performed May 13, 2012 revealing 80% proximal LAD stenosis which I stented using a drug-eluting stent.  She did have a total dominant RCA with left-to-right collaterals and normal LV function.  She had a Myoview since that time that showed ischemia in the RCA territory explainable by her anatomy.  She is remained asymptomatic.  Dyslipidemia History of  dyslipidemia intolerant to statin therapy on Repatha, Zetia and fenofibrate with lipid profile performed 10/29/2020 revealing total cholesterol 162, LDL of 76 and HDL 47.  Recent lipid profile performed 04/18/2021 revealed total cholesterol 115, LDL 48 and HDL 45.  Essential hypertension History of essential hypertension a blood pressure measured today at 154/78.  She does measure her blood pressure at home and it usually runs in the 120/70 range.  She is on Avapro, diltiazem, and Maxide.     [name] MD FACP,FACC,FAHA, [facility] 05/28/2021 4:54 PM

## 2020-11-14 NOTE — Assessment & Plan Note (Addendum)
History of dyslipidemia intolerant to statin therapy on Repatha, Zetia and fenofibrate with lipid profile performed 10/29/2020 revealing total cholesterol 162, LDL of 76 and HDL 47.  Recent lipid profile performed 04/18/2021 revealed total cholesterol 115, LDL 48 and HDL 45.

## 2020-11-14 NOTE — Assessment & Plan Note (Signed)
History of CAD s/p catheterization which I performed May 13, 2012 revealing 80% proximal LAD stenosis which I stented using a drug-eluting stent.  She did have a total dominant RCA with left-to-right collaterals and normal LV function.  She had a Myoview since that time that showed ischemia in the RCA territory explainable by her anatomy.  She is remained asymptomatic.

## 2020-11-14 NOTE — Patient Instructions (Signed)
Medication Instructions:  °Your physician recommends that you continue on your current medications as directed. Please refer to the Current Medication list given to you today. ° °*If you need a refill on your cardiac medications before your next appointment, please call your pharmacy* ° °Lab Work: °NONE ordered at this time of appointment  ° °If you have labs (blood work) drawn today and your tests are completely normal, you will receive your results only by: °MyChart Message (if you have MyChart) OR °A paper copy in the mail °If you have any lab test that is abnormal or we need to change your treatment, we will call you to review the results. ° °Testing/Procedures: °NONE ordered at this time of appointment  ° °Follow-Up: °At [facility], you and your health needs are our priority.  As part of our continuing mission to provide you with exceptional heart care, we have created designated Provider Care Teams.  These Care Teams include your primary Cardiologist (physician) and Advanced Practice Providers (APPs -  Physician Assistants and Nurse Practitioners) who all work together to provide you with the care you need, when you need it. ° °Your next appointment:   °1 year(s) ° °The format for your next appointment:   °In Person ° °Provider:   °Jonathan Berry, MD   ° °Other Instructions ° ° °

## 2020-11-14 NOTE — Assessment & Plan Note (Signed)
History of essential hypertension a blood pressure measured today at 154/78.  She does measure her blood pressure at home and it usually runs in the 120/70 range.  She is on Avapro, diltiazem, and Maxide.

## 2020-11-15 ENCOUNTER — Other Ambulatory Visit: Payer: Self-pay | Admitting: Endocrinology

## 2020-11-18 ENCOUNTER — Other Ambulatory Visit: Payer: Self-pay | Admitting: Cardiovascular Disease

## 2020-11-21 ENCOUNTER — Other Ambulatory Visit (HOSPITAL_BASED_OUTPATIENT_CLINIC_OR_DEPARTMENT_OTHER): Payer: Self-pay

## 2020-11-21 MED ORDER — IRBESARTAN 300 MG PO TABS: ORAL_TABLET | ORAL | 3 refills | Status: DC

## 2020-11-22 ENCOUNTER — Encounter (INDEPENDENT_AMBULATORY_CARE_PROVIDER_SITE_OTHER): Payer: Self-pay | Admitting: Ophthalmology

## 2020-11-22 ENCOUNTER — Other Ambulatory Visit: Payer: Self-pay

## 2020-11-22 ENCOUNTER — Ambulatory Visit (INDEPENDENT_AMBULATORY_CARE_PROVIDER_SITE_OTHER): Payer: Medicare Other | Admitting: Ophthalmology

## 2020-11-22 DIAGNOSIS — H353132 Nonexudative age-related macular degeneration, bilateral, intermediate dry stage: Secondary | ICD-10-CM | POA: Diagnosis not present

## 2020-11-22 DIAGNOSIS — H353211 Exudative age-related macular degeneration, right eye, with active choroidal neovascularization: Secondary | ICD-10-CM | POA: Diagnosis not present

## 2020-11-22 MED ORDER — BEVACIZUMAB 2.5 MG/0.1ML IZ SOSY
2.5000 mg | PREFILLED_SYRINGE | INTRAVITREAL | Status: AC | PRN
  Administered 2020-11-22: 2.5 mg via INTRAVITREAL

## 2020-11-22 NOTE — Assessment & Plan Note (Signed)
The nature of wet macular degeneration was discussed with the patient.  Forms of therapy reviewed include the use of Anti-VEGF medications injected painlessly into the eye, as well as other possible treatment modalities, including thermal laser therapy. Fellow eye involvement and risks were discussed with the patient. Upon the finding of wet age related macular degeneration, treatment will be offered. The treatment regimen is on a treat as needed basis with the intent to treat if necessary and extend interval of exams when possible. On average 1 out of 6 patients do not need lifetime therapy. However, the risk of recurrent disease is high for a lifetime.  Initially monthly, then periodic, examinations and evaluations will determine whether the next treatment is required on the day of the examination.  OD, 9-week follow-up interval post Avastin, repeat injection today and examination OD next in 9 weeks

## 2020-11-22 NOTE — Assessment & Plan Note (Signed)
  No signs of development of CNVM

## 2020-11-22 NOTE — Progress Notes (Signed)
11/22/2020     CHIEF COMPLAINT Patient presents for No chief complaint on file.   HISTORY OF PRESENT ILLNESS: Brittany Figueroa is a 80 y.o. female who presents to the clinic today for:   HPI   Follow-up today scheduled originally in 6 to 7 weeks now 9 weeks since patient had recent finger surgery Last edited by Hurman Horn, MD on 11/22/2020  4:01 PM.      Referring physician: Lajean Manes, [place]. [address] [place],  [place] [address]  HISTORICAL INFORMATION:   Selected notes from the MEDICAL RECORD NUMBER    Lab Results  Component Value Date   HGBA1C 6.0 10/23/2020     CURRENT MEDICATIONS: No current outpatient medications on file. (Ophthalmic Drugs)   No current facility-administered medications for this visit. (Ophthalmic Drugs)   Current Outpatient Medications (Other)  Medication Sig   acetaminophen (TYLENOL) 500 MG tablet Take 500 mg by mouth every 6 (six) hours as needed for mild pain or headache.   aspirin 81 MG tablet Take 81 mg by mouth daily.   calcium-vitamin D (OSCAL WITH D) 500-200 MG-UNIT per tablet Take 1 tablet by mouth daily as needed.   carvedilol (COREG) 12.5 MG tablet Take 12.5 mg by mouth 2 (two) times daily with a meal.   Cholecalciferol (VITAMIN D3) 2000 UNITS TABS Take 2,000 Units by mouth 2 (two) times daily.   clopidogrel (PLAVIX) 75 MG tablet TAKE 1 TABLET(75 MG) BY MOUTH DAILY   diclofenac sodium (VOLTAREN) 1 % GEL Apply 2 g topically daily as needed. For pain   DILT-XR 240 MG 24 hr capsule Take 240 mg by mouth daily.   ezetimibe (ZETIA) 10 MG tablet TAKE 1 TABLET(10 MG) BY MOUTH DAILY   fenofibrate 160 MG tablet TAKE 1 TABLET(160 MG) BY MOUTH DAILY (Patient taking differently: Take 80 mg by mouth daily.)   glucose blood (ONETOUCH VERIO) test strip Use to test blood sugar once daily Dx code E11.9   hydrocortisone valerate cream (WESTCORT) 0.2 % Apply 1 application topically daily as needed (ITCHING).   irbesartan (AVAPRO) 300 MG  tablet TAKE 1/2 TABLET BY MOUTH TWICE DAILY IN THE MORNING AND AT BEDTIME   irbesartan (AVAPRO) 300 MG tablet TAKE 1/2 TABLET BY MOUTH TWICE DAILY( IN THE MORNING AND AT BEDTIME)   levothyroxine (SYNTHROID) 25 MCG tablet TAKE 1 1/2 TABLET BY MOUTH DAILY BEFORE BREAKFAST   metFORMIN (GLUCOPHAGE-XR) 500 MG 24 hr tablet TAKE 2 TABLETS BY MOUTH DAILY WITH SUPPER   Multiple Vitamin (MULTIVITAMIN WITH MINERALS) TABS Take 1 tablet by mouth daily.   Multiple Vitamins-Minerals (PRESERVISION AREDS 2+MULTI VIT PO) Take 1 tablet by mouth in the morning and at bedtime.   nitroGLYCERIN (NITROSTAT) 0.4 MG SL tablet PLACE 1 TABLET UNDER THE TONGUE EVERY 5 MINUTES AS NEEDED FOR CHEST PAIN   omega-3 acid ethyl esters (LOVAZA) 1 g capsule Take 2 capsules (2 g total) by mouth 2 (two) times daily.   REPATHA SURECLICK 992 MG/ML SOAJ INJECT 1 PEN UNDER THE SKIN EVERY 14 DAYS   triamterene-hydrochlorothiazide (MAXZIDE-25) 37.5-25 MG tablet Take 1 tablet by mouth daily. Patient taking  1/4 tablet   No current facility-administered medications for this visit. (Other)      REVIEW OF SYSTEMS:    ALLERGIES Allergies  Allergen Reactions   Statins Other (See Comments)    "intermittent loss of circulation; hands and arms will go to sleep; feet will get cramps; fatigue" (05/13/2012).  This occurred with  Lipitor, Zocor, Crestor 5 mg qd, and she thinks pravastatin as well   Amlodipine Itching   Gadolinium Derivatives Itching and Nausea Only    Pt had severe nausea and itching on legs, neck and back, per dr [name] pt needs 13 hour prep before contrast in the future    PAST MEDICAL HISTORY Past Medical History:  Diagnosis Date   Abnormal finding on EKG, new anterolateral T-wave inversions  05/14/2012   Abnormal nuclear stress test, with infero-lateral ischemia 05/14/2012   Arthritis    "right thumb; all my fingers" (05/13/2012), cervical spondylosis    Atypical angina ([place]) 05/14/2012   CAD (coronary artery  disease), 05/13/12, with 80% LAD 05/14/2012   Cancer ([place])    basal cell on facial in L temporal region     [facility] arthritis, thumb, degenerative    Complication of anesthesia 12/2009   "had endoscopy; larynx went into spasms; stopped breathing for 15 seconds" (05/13/2012)   GERD (gastroesophageal reflux disease) 2011   Hypercholesteremia    Hypertension    S/P angioplasty with stent, LAD 05/13/12 05/14/2012   Tuberculosis    test +- [place] [facility] - Dr. [name]- CXR- OK   Past Surgical History:  Procedure Laterality Date   ANTERIOR CERVICAL DECOMP/DISCECTOMY FUSION N/A 12/13/2013   Procedure: ANTERIOR CERVICAL DECOMPRESSION/DISCECTOMY FUSION 3 LEVELS Cervical four/five,five/six,six/seven. Anterior cervical disectomy and fusion with peek and plate ;  Surgeon: [name], MD;  Location: [place] [facility];  Service: Neurosurgery;  Laterality: N/A;   CARDIAC CATHETERIZATION N/A 07/02/2015   Procedure: Left Heart Cath and Coronary Angiography;  Surgeon: [name], MD;  Location: [facility];  Service: Cardiovascular;  Laterality: N/A;   CORONARY ANGIOPLASTY WITH STENT PLACEMENT  05/13/2012   DES to LAD; she has total RCA with left to rt coll. normal LV function done for positive nuc study   DILATION AND CURETTAGE OF UTERUS  1960's; 1970's; 1980   "probably 3" (05/13/2012)   FOREARM / WRIST TENDON LESION EXCISION  ?11/2001   "right; did waver to try to get ulnar out of hole that it had cut" (05/13/2012   INGUINAL HERNIA REPAIR  ~ 1966; 07/21/2002   "right; left" (05/13/2012)   LEFT HEART CATHETERIZATION WITH CORONARY ANGIOGRAM N/A 05/13/2012   Procedure: LEFT HEART CATHETERIZATION WITH CORONARY ANGIOGRAM;  Surgeon: [name], MD;  Location: [facility] CATH LAB;  Service: Cardiovascular;  Laterality: N/A;   OSTEOTOMY AND ULNAR SHORTENING  07/21/2002   "right" (05/13/2012)   TONSILLECTOMY AND ADENOIDECTOMY  1950's   TUBAL LIGATION  1980    FAMILY HISTORY Family History  Problem Relation Age  of Onset   Hypertension Mother    Diabetes Mother    Hyperlipidemia Father    Heart disease Father    Stroke Father    Heart disease Sister    Diabetes Sister    Heart disease Brother    Diabetes Maternal Aunt     SOCIAL HISTORY Social History   Tobacco Use   Smoking status: Former    Packs/day: 0.50    Years: 20.00    Pack years: 10.00    Types: Cigarettes    Quit date: 07/05/1988    Years since quitting: 32.4   Smokeless tobacco: Never  Substance Use Topics   Alcohol use: Yes    Alcohol/week: 12.0 standard drinks    Types: 12 Glasses of wine per week    Comment: 05/13/2012 "6-8 oz glass of red wine q hs"    Drug  use: No         OPHTHALMIC EXAM:  Base Eye Exam     Visual Acuity (ETDRS)       Right Left   Dist cc 20/25 +2 20/20         Tonometry (Tonopen, 4:01 PM)       Right Left   Pressure 12 12         Pupils       React APD   Right Brisk None   Left Brisk None         Visual Fields       Left Right    Full Full         Extraocular Movement       Right Left    Full, Ortho Full, Ortho         Neuro/Psych     Oriented x3: Yes   Mood/Affect: Normal         Dilation     Right eye: 1.0% Mydriacyl, 2.5% Phenylephrine @ 4:02 PM           Slit Lamp and Fundus Exam     External Exam       Right Left   External Normal Normal         Slit Lamp Exam       Right Left   Lids/Lashes Normal Normal   Conjunctiva/Sclera White and quiet White and quiet   Cornea Clear Clear   Anterior Chamber Deep and quiet Deep and quiet   Iris Round and reactive Round and reactive   Lens 2+ Nuclear sclerosis 2+ Nuclear sclerosis   Anterior Vitreous Normal, no cells Normal         Fundus Exam       Right Left   Posterior Vitreous Normal,  no cells    Disc Normal    C/D Ratio 0.45    Macula Hard drusen, Microaneurysms, no macular thickening    Vessels Normal    Periphery Normal             IMAGING AND PROCEDURES   Imaging and Procedures for 11/22/20  OCT, Retina - OU - Both Eyes       Right Eye Quality was good. Central Foveal Thickness: 258. Progression has improved. Findings include abnormal foveal contour, no SRF, no IRF.   Left Eye Quality was good. Scan locations included subfoveal. Central Foveal Thickness: 275. Progression has been stable. Findings include abnormal foveal contour.   Notes OD, much improved anatomy overall as compared to February 2022 onset of CNVM with intraretinal and subretinal fluid.  No signs of recurrences.  Currently at 9-week interval for planned injection today yet with recent hand surgery disclosed today by the patient only 4 days previous I do not recommend intravitreal Avastin because of its delayed healing effects       Intravitreal Injection, Pharmacologic Agent - OD - Right Eye       Time Out 11/22/2020. 4:40 PM. Confirmed correct patient, procedure, site, and patient consented.   Anesthesia Topical anesthesia was used. Anesthetic medications included Akten 3.5%.   Procedure Preparation included Tobramycin 0.3%, 10% betadine to eyelids, 5% betadine to ocular surface. A 30 gauge needle was used.   Injection: 2.5 mg bevacizumab 2.5 MG/0.1ML   Route: Intravitreal, Site: Right Eye   NDC: [phone], Lot: [id number]   Post-op Post injection exam found visual acuity of at least counting fingers. The patient tolerated the procedure well. There were no complications.  The patient received written and verbal post procedure care education. Post injection medications were not given.              ASSESSMENT/PLAN:  Exudative age-related macular degeneration of right eye with active choroidal neovascularization (HCC) The nature of wet macular degeneration was discussed with the patient.  Forms of therapy reviewed include the use of Anti-VEGF medications injected painlessly into the eye, as well as other possible treatment modalities, including thermal laser  therapy. Fellow eye involvement and risks were discussed with the patient. Upon the finding of wet age related macular degeneration, treatment will be offered. The treatment regimen is on a treat as needed basis with the intent to treat if necessary and extend interval of exams when possible. On average 1 out of 6 patients do not need lifetime therapy. However, the risk of recurrent disease is high for a lifetime.  Initially monthly, then periodic, examinations and evaluations will determine whether the next treatment is required on the day of the examination.  OD, 9-week follow-up interval post Avastin, repeat injection today and examination OD next in 9 weeks  Intermediate stage nonexudative age-related macular degeneration of both eyes  No signs of development of CNVM     ICD-10-CM   1. Exudative age-related macular degeneration of right eye with active choroidal neovascularization (HCC)  H35.3211 OCT, Retina - OU - Both Eyes    Intravitreal Injection, Pharmacologic Agent - OD - Right Eye    bevacizumab (AVASTIN) SOSY 2.5 mg    2. Intermediate stage nonexudative age-related macular degeneration of both eyes  H35.3132       1.  OD, improved overall initial evaluation is planned in 6 to 7 weeks now 9 weeks due to delay for another surgery on extremity.  No signs of recurrence of CNVM at 9-week interval.  We will repeat injection today and examination next again in 9 weeks  2.  3.  Ophthalmic Meds Ordered this visit:  Meds ordered this encounter  Medications   bevacizumab (AVASTIN) SOSY 2.5 mg       Return in about 9 weeks (around 01/24/2021) for dilate, OD, AVASTIN OCT.  There are no Patient Instructions on file for this visit.   Explained the diagnoses, plan, and follow up with the patient and they expressed understanding.  Patient expressed understanding of the importance of proper follow up care.   Clent Demark Bethel Gaglio M.D. Diseases & Surgery of the Retina and Vitreous Retina &  Diabetic [place] 11/22/20     Abbreviations: M myopia (nearsighted); A astigmatism; H hyperopia (farsighted); P presbyopia; Mrx spectacle prescription;  CTL contact lenses; OD right eye; OS left eye; OU both eyes  XT exotropia; ET esotropia; PEK punctate epithelial keratitis; PEE punctate epithelial erosions; DES dry eye syndrome; MGD meibomian gland dysfunction; ATs artificial tears; PFAT's preservative free artificial tears; [place] nuclear sclerotic cataract; PSC posterior subcapsular cataract; ERM epi-retinal membrane; PVD posterior vitreous detachment; RD retinal detachment; DM diabetes mellitus; DR diabetic retinopathy; NPDR non-proliferative diabetic retinopathy; PDR proliferative diabetic retinopathy; CSME clinically significant macular edema; DME diabetic macular edema; dbh dot blot hemorrhages; CWS cotton wool spot; POAG primary open angle glaucoma; C/D cup-to-disc ratio; HVF humphrey visual field; GVF goldmann visual field; OCT optical coherence tomography; IOP intraocular pressure; BRVO Branch retinal vein occlusion; CRVO central retinal vein occlusion; CRAO central retinal artery occlusion; BRAO branch retinal artery occlusion; RT retinal tear; SB scleral buckle; PPV pars plana vitrectomy; VH Vitreous hemorrhage; PRP panretinal laser photocoagulation; IVK intravitreal kenalog;  VMT vitreomacular traction; MH Macular hole;  NVD neovascularization of the disc; NVE neovascularization elsewhere; AREDS age related eye disease study; ARMD age related macular degeneration; POAG primary open angle glaucoma; EBMD epithelial/anterior basement membrane dystrophy; ACIOL anterior chamber intraocular lens; IOL intraocular lens; PCIOL posterior chamber intraocular lens; Phaco/IOL phacoemulsification with intraocular lens placement; [place] photorefractive keratectomy; LASIK laser assisted in situ keratomileusis; HTN hypertension; DM diabetes mellitus; COPD chronic obstructive pulmonary disease

## 2021-01-01 DIAGNOSIS — C44319 Basal cell carcinoma of skin of other parts of face: Secondary | ICD-10-CM | POA: Diagnosis not present

## 2021-01-01 DIAGNOSIS — D485 Neoplasm of uncertain behavior of skin: Secondary | ICD-10-CM | POA: Diagnosis not present

## 2021-01-01 DIAGNOSIS — L820 Inflamed seborrheic keratosis: Secondary | ICD-10-CM | POA: Diagnosis not present

## 2021-01-02 ENCOUNTER — Telehealth: Payer: Self-pay | Admitting: Cardiovascular Disease

## 2021-01-02 NOTE — Telephone Encounter (Signed)
[employer] is calling stating they received the patient's application for Repatha, but there is missing information needed before they can move forward in regards to it. The pt's household income info is missing from the application as well as the PA. The application is invalid at this time from lack of the NPI and prescribing physicians information.

## 2021-01-02 NOTE — Telephone Encounter (Signed)
Nurse called back [employer], spoke with Lelan Pons. She reports that the pt's patient assistance form was denied because the patient is missing the Prior auth determination letter, NPI number, and may need a new script for Repatha. Lelan Pons advised that this information can be sent to [employer]'s fax number [phone]. Nurse to send to PharmD for review and advice.

## 2021-01-02 NOTE — Telephone Encounter (Signed)
Called and lmomed the pt that they need to contact the [employer] ditrctly to provid-e the missing information that they need to [phone]. I faxed the repatha approval letter to the [phone] number as requested

## 2021-01-03 DIAGNOSIS — E039 Hypothyroidism, unspecified: Secondary | ICD-10-CM | POA: Diagnosis not present

## 2021-01-03 DIAGNOSIS — E782 Mixed hyperlipidemia: Secondary | ICD-10-CM | POA: Diagnosis not present

## 2021-01-03 DIAGNOSIS — I10 Essential (primary) hypertension: Secondary | ICD-10-CM | POA: Diagnosis not present

## 2021-01-03 DIAGNOSIS — I2510 Atherosclerotic heart disease of native coronary artery without angina pectoris: Secondary | ICD-10-CM | POA: Diagnosis not present

## 2021-01-03 NOTE — Telephone Encounter (Signed)
Pt returned our call and was thankful that we relayed the message from the pt assistance I told her that we faxed the approval letter and she voiced understanding

## 2021-01-24 ENCOUNTER — Other Ambulatory Visit: Payer: Self-pay

## 2021-01-24 ENCOUNTER — Ambulatory Visit (INDEPENDENT_AMBULATORY_CARE_PROVIDER_SITE_OTHER): Payer: Medicare Other | Admitting: Ophthalmology

## 2021-01-24 ENCOUNTER — Encounter (INDEPENDENT_AMBULATORY_CARE_PROVIDER_SITE_OTHER): Payer: Self-pay | Admitting: Ophthalmology

## 2021-01-24 DIAGNOSIS — H353211 Exudative age-related macular degeneration, right eye, with active choroidal neovascularization: Secondary | ICD-10-CM | POA: Diagnosis not present

## 2021-01-24 MED ORDER — BEVACIZUMAB 2.5 MG/0.1ML IZ SOSY
2.5000 mg | PREFILLED_SYRINGE | INTRAVITREAL | Status: AC | PRN
  Administered 2021-01-24: 2.5 mg via INTRAVITREAL

## 2021-01-24 NOTE — Assessment & Plan Note (Signed)
Today at some 9 weeks post most recent injection Avastin controlling successfully with good acuity CNVM which was onset February 2022.  Repeat injection today and maintain 9 to 10-week interval examination

## 2021-01-24 NOTE — Progress Notes (Signed)
01/24/2021     CHIEF COMPLAINT Patient presents for  Chief Complaint  Patient presents with   Retina Follow Up      HISTORY OF PRESENT ILLNESS: Brittany Figueroa is a 80 y.o. female who presents to the clinic today for:   HPI     Retina Follow Up   Patient presents with  Wet AMD.  In right eye.  This started 9 weeks ago.  Duration of 9 weeks.        Comments   9 week f/u OD with OCT, possible Avastin  Pt denies any visual changes since previous visit. Pt denies any new floaters or flashes. Pt denies any eye pain.   Eye Meds: Generic Zaditor       Last edited by Reather Littler, COA on 01/24/2021  1:40 PM.      Referring physician: Lajean Manes, [place]. [address] [place],  [place] [address]  HISTORICAL INFORMATION:   Selected notes from the MEDICAL RECORD NUMBER    Lab Results  Component Value Date   HGBA1C 6.0 10/23/2020     CURRENT MEDICATIONS: No current outpatient medications on file. (Ophthalmic Drugs)   No current facility-administered medications for this visit. (Ophthalmic Drugs)   Current Outpatient Medications (Other)  Medication Sig   acetaminophen (TYLENOL) 500 MG tablet Take 500 mg by mouth every 6 (six) hours as needed for mild pain or headache.   aspirin 81 MG tablet Take 81 mg by mouth daily.   calcium-vitamin D (OSCAL WITH D) 500-200 MG-UNIT per tablet Take 1 tablet by mouth daily as needed.   carvedilol (COREG) 12.5 MG tablet Take 12.5 mg by mouth 2 (two) times daily with a meal.   Cholecalciferol (VITAMIN D3) 2000 UNITS TABS Take 2,000 Units by mouth 2 (two) times daily.   clopidogrel (PLAVIX) 75 MG tablet TAKE 1 TABLET(75 MG) BY MOUTH DAILY   diclofenac sodium (VOLTAREN) 1 % GEL Apply 2 g topically daily as needed. For pain   DILT-XR 240 MG 24 hr capsule Take 240 mg by mouth daily.   ezetimibe (ZETIA) 10 MG tablet TAKE 1 TABLET(10 MG) BY MOUTH DAILY   fenofibrate 160 MG tablet TAKE 1 TABLET(160 MG) BY MOUTH DAILY (Patient  taking differently: Take 80 mg by mouth daily.)   glucose blood (ONETOUCH VERIO) test strip Use to test blood sugar once daily Dx code E11.9   hydrocortisone valerate cream (WESTCORT) 0.2 % Apply 1 application topically daily as needed (ITCHING).   irbesartan (AVAPRO) 300 MG tablet TAKE 1/2 TABLET BY MOUTH TWICE DAILY IN THE MORNING AND AT BEDTIME   irbesartan (AVAPRO) 300 MG tablet TAKE 1/2 TABLET BY MOUTH TWICE DAILY( IN THE MORNING AND AT BEDTIME)   levothyroxine (SYNTHROID) 25 MCG tablet TAKE 1 1/2 TABLET BY MOUTH DAILY BEFORE BREAKFAST   metFORMIN (GLUCOPHAGE-XR) 500 MG 24 hr tablet TAKE 2 TABLETS BY MOUTH DAILY WITH SUPPER   Multiple Vitamin (MULTIVITAMIN WITH MINERALS) TABS Take 1 tablet by mouth daily.   Multiple Vitamins-Minerals (PRESERVISION AREDS 2+MULTI VIT PO) Take 1 tablet by mouth in the morning and at bedtime.   nitroGLYCERIN (NITROSTAT) 0.4 MG SL tablet PLACE 1 TABLET UNDER THE TONGUE EVERY 5 MINUTES AS NEEDED FOR CHEST PAIN   omega-3 acid ethyl esters (LOVAZA) 1 g capsule Take 2 capsules (2 g total) by mouth 2 (two) times daily.   REPATHA SURECLICK [id number] MG/ML SOAJ INJECT 1 PEN UNDER THE SKIN EVERY 14 DAYS   triamterene-hydrochlorothiazide (  MAXZIDE-25) 37.5-25 MG tablet Take 1 tablet by mouth daily. Patient taking  1/4 tablet   No current facility-administered medications for this visit. (Other)      REVIEW OF SYSTEMS:    ALLERGIES Allergies  Allergen Reactions   Statins Other (See Comments)    "intermittent loss of circulation; hands and arms will go to sleep; feet will get cramps; fatigue" (05/13/2012).  This occurred with Lipitor, Zocor, Crestor 5 mg qd, and she thinks pravastatin as well   Amlodipine Itching   Gadolinium Derivatives Itching and Nausea Only    Pt had severe nausea and itching on legs, neck and back, per dr [name] pt needs 13 hour prep before contrast in the future    PAST MEDICAL HISTORY Past Medical History:  Diagnosis Date   Abnormal  finding on EKG, new anterolateral T-wave inversions  05/14/2012   Abnormal nuclear stress test, with infero-lateral ischemia 05/14/2012   Arthritis    "right thumb; all my fingers" (05/13/2012), cervical spondylosis    Atypical angina ([place]) 05/14/2012   CAD (coronary artery disease), 05/13/12, with 80% LAD 05/14/2012   Cancer ([place])    basal cell on facial in L temporal region     [facility] arthritis, thumb, degenerative    Complication of anesthesia 12/2009   "had endoscopy; larynx went into spasms; stopped breathing for 15 seconds" (05/13/2012)   GERD (gastroesophageal reflux disease) 2011   Hypercholesteremia    Hypertension    S/P angioplasty with stent, LAD 05/13/12 05/14/2012   Tuberculosis    test +- [facility] [facility] - Dr. [name]- CXR- OK   Past Surgical History:  Procedure Laterality Date   ANTERIOR CERVICAL DECOMP/DISCECTOMY FUSION N/A 12/13/2013   Procedure: ANTERIOR CERVICAL DECOMPRESSION/DISCECTOMY FUSION 3 LEVELS Cervical four/five,five/six,six/seven. Anterior cervical disectomy and fusion with peek and plate ;  Surgeon: [name], MD;  Location: [facility];  Service: Neurosurgery;  Laterality: N/A;   CARDIAC CATHETERIZATION N/A 07/02/2015   Procedure: Left Heart Cath and Coronary Angiography;  Surgeon: [name], MD;  Location: [facility];  Service: Cardiovascular;  Laterality: N/A;   CORONARY ANGIOPLASTY WITH STENT PLACEMENT  05/13/2012   DES to LAD; she has total RCA with left to rt coll. normal LV function done for positive nuc study   DILATION AND CURETTAGE OF UTERUS  1960's; 1970's; 1980   "probably 3" (05/13/2012)   FOREARM / WRIST TENDON LESION EXCISION  ?11/2001   "right; did waver to try to get ulnar out of hole that it had cut" (05/13/2012   INGUINAL HERNIA REPAIR  ~ 1966; 07/21/2002   "right; left" (05/13/2012)   LEFT HEART CATHETERIZATION WITH CORONARY ANGIOGRAM N/A 05/13/2012   Procedure: LEFT HEART CATHETERIZATION WITH CORONARY ANGIOGRAM;  Surgeon:  [name], MD;  Location: [facility] LAB;  Service: Cardiovascular;  Laterality: N/A;   OSTEOTOMY AND ULNAR SHORTENING  07/21/2002   "right" (05/13/2012)   TONSILLECTOMY AND ADENOIDECTOMY  1950's   TUBAL LIGATION  1980    FAMILY HISTORY Family History  Problem Relation Age of Onset   Hypertension Mother    Diabetes Mother    Hyperlipidemia Father    Heart disease Father    Stroke Father    Heart disease Sister    Diabetes Sister    Heart disease Brother    Diabetes Maternal Aunt     SOCIAL HISTORY Social History   Tobacco Use   Smoking status: Former    Packs/day: 0.50    Years: 20.00  Pack years: 10.00    Types: Cigarettes    Quit date: 07/05/1988    Years since quitting: 32.5   Smokeless tobacco: Never  Substance Use Topics   Alcohol use: Yes    Alcohol/week: 12.0 standard drinks    Types: 12 Glasses of wine per week    Comment: 05/13/2012 "6-8 oz glass of red wine q hs"    Drug use: No         OPHTHALMIC EXAM:  Base Eye Exam     Visual Acuity (ETDRS)       Right Left   Dist cc 20/25 +2 20/20 -2    Correction: Glasses         Tonometry (Tonopen, 1:48 PM)       Right Left   Pressure 8 8         Pupils       Pupils Dark Light Shape React APD   Right PERRL 4 3 Round Brisk None   Left PERRL 4 3 Round Brisk None         Visual Fields (Counting fingers)       Left Right    Full Full         Extraocular Movement       Right Left    Full, Ortho Full, Ortho         Neuro/Psych     Oriented x3: Yes   Mood/Affect: Normal         Dilation     Right eye: 1.0% Mydriacyl, 2.5% Phenylephrine @ 1:48 PM           Slit Lamp and Fundus Exam     External Exam       Right Left   External Normal Normal         Slit Lamp Exam       Right Left   Lids/Lashes Normal Normal   Conjunctiva/Sclera White and quiet White and quiet   Cornea Clear Clear   Anterior Chamber Deep and quiet Deep and quiet   Iris Round and  reactive Round and reactive   Lens 2+ Nuclear sclerosis 2+ Nuclear sclerosis   Anterior Vitreous Normal, no cells Normal         Fundus Exam       Right Left   Posterior Vitreous Normal,  no cells    Disc Normal    C/D Ratio 0.45    Macula Hard drusen, Microaneurysms, no macular thickening    Vessels Normal    Periphery Normal             IMAGING AND PROCEDURES  Imaging and Procedures for 01/24/21  OCT, Retina - OU - Both Eyes       Right Eye Quality was good. Scan locations included subfoveal. Central Foveal Thickness: 258. Progression has improved. Findings include abnormal foveal contour, no SRF, no IRF.   Left Eye Quality was good. Scan locations included subfoveal. Central Foveal Thickness: 279. Progression has been stable. Findings include abnormal foveal contour.   Notes OD, much improved anatomy overall as compared to February 2022 onset of CNVM with intraretinal and subretinal fluid.  No signs of recurrences.        Intravitreal Injection, Pharmacologic Agent - OD - Right Eye       Time Out 01/24/2021. 2:12 PM. Confirmed correct patient, procedure, site, and patient consented.   Anesthesia Topical anesthesia was used. Anesthetic medications included Akten 3.5%.   Procedure Preparation included Tobramycin 0.3%, 10%  betadine to eyelids, 5% betadine to ocular surface. A 30 gauge needle was used.   Injection: 2.5 mg bevacizumab 2.5 MG/0.1ML   Route: Intravitreal, Site: Right Eye   NDC: [phone], Lot: [id number]   Post-op Post injection exam found visual acuity of at least counting fingers. The patient tolerated the procedure well. There were no complications. The patient received written and verbal post procedure care education. Post injection medications were not given.              ASSESSMENT/PLAN:  Exudative age-related macular degeneration of right eye with active choroidal neovascularization ([place]) Today at some 9 weeks post most recent  injection Avastin controlling successfully with good acuity CNVM which was onset February 2022.  Repeat injection today and maintain 9 to 10-week interval examination     ICD-10-CM   1. Exudative age-related macular degeneration of right eye with active choroidal neovascularization (H[facility])  H35.3211 OCT, Retina - OU - Both Eyes    Intravitreal Injection, Pharmacologic Agent - OD - Right Eye    bevacizumab (AVASTIN) SOSY 2.5 mg      1.  OD, with much less active CNVM as compared to onset February 2022 currently at 9-week interval examination.  We will repeat examination today and extend exam interval next to 10 weeks  2.  3.  Ophthalmic Meds Ordered this visit:  Meds ordered this encounter  Medications   bevacizumab (AVASTIN) SOSY 2.5 mg       Return in about 10 weeks (around 04/04/2021) for DILATE OU, AVASTIN OCT, OD.  There are no Patient Instructions on file for this visit.   Explained the diagnoses, plan, and follow up with the patient and they expressed understanding.  Patient expressed understanding of the importance of proper follow up care.   Clent Demark Porcha Deblanc M.D. Diseases & Surgery of the Retina and Vitreous [facility] [place] 01/24/21     Abbreviations: M myopia (nearsighted); A astigmatism; H hyperopia (farsighted); P presbyopia; Mrx spectacle prescription;  CTL contact lenses; OD right eye; OS left eye; OU both eyes  XT exotropia; ET esotropia; PEK punctate epithelial keratitis; PEE punctate epithelial erosions; DES dry eye syndrome; MGD meibomian gland dysfunction; ATs artificial tears; PFAT's preservative free artificial tears; [place] nuclear sclerotic cataract; PSC posterior subcapsular cataract; ERM epi-retinal membrane; PVD posterior vitreous detachment; RD retinal detachment; DM diabetes mellitus; DR diabetic retinopathy; NPDR non-proliferative diabetic retinopathy; PDR proliferative diabetic retinopathy; CSME clinically significant macular edema; DME  diabetic macular edema; dbh dot blot hemorrhages; CWS cotton wool spot; POAG primary open angle glaucoma; C/D cup-to-disc ratio; HVF humphrey visual field; GVF goldmann visual field; OCT optical coherence tomography; IOP intraocular pressure; BRVO Branch retinal vein occlusion; CRVO central retinal vein occlusion; CRAO central retinal artery occlusion; BRAO branch retinal artery occlusion; RT retinal tear; SB scleral buckle; PPV pars plana vitrectomy; VH Vitreous hemorrhage; PRP panretinal laser photocoagulation; IVK intravitreal kenalog; VMT vitreomacular traction; MH Macular hole;  NVD neovascularization of the disc; NVE neovascularization elsewhere; AREDS age related eye disease study; ARMD age related macular degeneration; POAG primary open angle glaucoma; EBMD epithelial/anterior basement membrane dystrophy; ACIOL anterior chamber intraocular lens; IOL intraocular lens; PCIOL posterior chamber intraocular lens; Phaco/IOL phacoemulsification with intraocular lens placement; [place] photorefractive keratectomy; LASIK laser assisted in situ keratomileusis; HTN hypertension; DM diabetes mellitus; COPD chronic obstructive pulmonary disease

## 2021-01-31 DIAGNOSIS — Z1231 Encounter for screening mammogram for malignant neoplasm of breast: Secondary | ICD-10-CM | POA: Diagnosis not present

## 2021-02-10 DIAGNOSIS — H353122 Nonexudative age-related macular degeneration, left eye, intermediate dry stage: Secondary | ICD-10-CM | POA: Diagnosis not present

## 2021-02-15 DIAGNOSIS — L538 Other specified erythematous conditions: Secondary | ICD-10-CM | POA: Diagnosis not present

## 2021-02-15 DIAGNOSIS — L298 Other pruritus: Secondary | ICD-10-CM | POA: Diagnosis not present

## 2021-02-15 DIAGNOSIS — L820 Inflamed seborrheic keratosis: Secondary | ICD-10-CM | POA: Diagnosis not present

## 2021-02-15 DIAGNOSIS — L244 Irritant contact dermatitis due to drugs in contact with skin: Secondary | ICD-10-CM | POA: Diagnosis not present

## 2021-02-25 ENCOUNTER — Other Ambulatory Visit: Payer: Self-pay

## 2021-02-25 ENCOUNTER — Other Ambulatory Visit (INDEPENDENT_AMBULATORY_CARE_PROVIDER_SITE_OTHER): Payer: Medicare Other

## 2021-02-25 DIAGNOSIS — I10 Essential (primary) hypertension: Secondary | ICD-10-CM | POA: Diagnosis not present

## 2021-02-25 DIAGNOSIS — E039 Hypothyroidism, unspecified: Secondary | ICD-10-CM | POA: Diagnosis not present

## 2021-02-25 DIAGNOSIS — R7303 Prediabetes: Secondary | ICD-10-CM

## 2021-02-25 LAB — COMPREHENSIVE METABOLIC PANEL
ALT: 15 U/L (ref 0–35)
AST: 18 U/L (ref 0–37)
Albumin: 4.1 g/dL (ref 3.5–5.2)
Alkaline Phosphatase: 36 U/L — ABNORMAL LOW (ref 39–117)
BUN: 16 mg/dL (ref 6–23)
CO2: 25 mEq/L (ref 19–32)
Calcium: 9.9 mg/dL (ref 8.4–10.5)
Chloride: 99 mEq/L (ref 96–112)
Creatinine, Ser: 0.74 mg/dL (ref 0.40–1.20)
GFR: 77.68 mL/min (ref >60.00)
Glucose, Bld: 119 mg/dL — ABNORMAL HIGH (ref 70–99)
Potassium: 4.2 mEq/L (ref 3.5–5.1)
Sodium: 132 mEq/L — ABNORMAL LOW (ref 135–145)
Total Bilirubin: 0.4 mg/dL (ref 0.2–1.2)
Total Protein: 7 g/dL (ref 6.0–8.3)

## 2021-02-25 LAB — TSH: TSH: 2.68 u[IU]/mL (ref 0.35–5.50)

## 2021-02-25 LAB — T4, FREE: Free T4: 1.16 ng/dL (ref 0.60–1.60)

## 2021-02-25 LAB — HEMOGLOBIN A1C: Hgb A1c MFr Bld: 6.4 % (ref 4.6–6.5)

## 2021-02-28 ENCOUNTER — Other Ambulatory Visit: Payer: Self-pay

## 2021-02-28 ENCOUNTER — Ambulatory Visit (INDEPENDENT_AMBULATORY_CARE_PROVIDER_SITE_OTHER): Payer: Medicare Other | Admitting: Endocrinology

## 2021-02-28 VITALS — BP 122/70 | HR 83 | Ht 61.0 in | Wt 135.0 lb

## 2021-02-28 DIAGNOSIS — R7303 Prediabetes: Secondary | ICD-10-CM | POA: Diagnosis not present

## 2021-02-28 DIAGNOSIS — E871 Hypo-osmolality and hyponatremia: Secondary | ICD-10-CM | POA: Diagnosis not present

## 2021-02-28 DIAGNOSIS — E039 Hypothyroidism, unspecified: Secondary | ICD-10-CM

## 2021-02-28 DIAGNOSIS — E559 Vitamin D deficiency, unspecified: Secondary | ICD-10-CM | POA: Diagnosis not present

## 2021-02-28 NOTE — Progress Notes (Signed)
+Patient ID: Brittany Figueroa, female   DOB: Feb 24, 1941, 80 y.o.   MRN: [id number]           Chief complaint: Endocrinology follow-up    History of Present Illness:  PREVIOUS history: She may have been told about 10 years ago that she had prediabetes and reportedly had a normal glucose tolerance test. She was told by her PCP that she has type 2 diabetes based on an A1c of 6.5% in 07/2013 She has not had any other A1c is subsequently above normal, normal range and previous lab 6.2 or less She also has not had any abnormal blood sugars except a glucose of 125 in 3/16  Has history of increased fasting blood sugars ranging from 108-125 since about 2012  GLUCOSE TOLERANCE test on 05/23/15: Fasting glucose 110, two-hour glucose 192  RECENT history:   She has impaired fasting glucose and glucose intolerance  She has been intermittently on metformin ER since about 08/2015 In 1/18 when A1c was 6.5 and glucose 133 fasting she was told to start back on metformin and also improve her diet She was taking 1500 mg a day but could not tolerate this because of feeling of bloating  Current regimen: Metformin ER 500 mg 2 tablets a day in pm  A1c is 6.4, previously 6.0  Current blood sugar data, management and history    She is able to bring her weight back down recently She generally fairly consistent with watching her sugars and starches Has had some difficulty with doing any walking for exercise because of musculoskeletal issues and foot pain Lowest A1c last year was 5.9  she is mostly checking blood sugars in the mornings and rarely later in the day Lab fasting glucose was 119 She thinks that metformin ER 500 is now causing heartburn as symptoms were better when she stopped it for 3 to 4 days; takes both tablets at dinnertime daily, previously had not reported the symptoms   Using freestyle neo monitor RECENT range 97-117 FASTING and 89, 104 before dinner AVERAGE 108  Weight history:  Previous range 139-143  Wt Readings from Last 3 Encounters:  02/28/21 135 lb (61.2 kg)  11/14/20 136 lb (61.7 kg)  10/26/20 140 lb (63.5 kg)    Lab Results  Component Value Date   HGBA1C 6.4 02/25/2021   HGBA1C 6.0 10/23/2020   HGBA1C 6.0 07/17/2020   Lab Results  Component Value Date   MICROALBUR <0.7 07/17/2020   LDLCALC 76 10/29/2020   CREATININE 0.74 02/25/2021   HYPOTHYROIDISM and other problems reviewed today: See review of systems    Lab on 02/25/2021  Component Date Value Ref Range Status   Free T4 02/25/2021 1.16  0.60 - 1.60 ng/dL Final   Comment: Specimens from patients who are undergoing biotin therapy and /or ingesting biotin supplements may contain high levels of biotin.  The higher biotin concentration in these specimens interferes with this Free T4 assay.  Specimens that contain high levels  of biotin may cause false high results for this Free T4 assay.  Please interpret results in light of the total clinical presentation of the patient.     TSH 02/25/2021 2.68  0.35 - 5.50 uIU/mL Final   Sodium 02/25/2021 132 (A) 135 - 145 mEq/L Final   Potassium 02/25/2021 4.2  3.5 - 5.1 mEq/L Final   Chloride 02/25/2021 99  96 - 112 mEq/L Final   CO2 02/25/2021 25  19 - 32 mEq/L Final   Glucose, Bld 02/25/2021 119 (  A) 70 - 99 mg/dL Final   BUN 02/25/2021 16  6 - 23 mg/dL Final   Creatinine, Ser 02/25/2021 0.74  0.40 - 1.20 mg/dL Final   Total Bilirubin 02/25/2021 0.4  0.2 - 1.2 mg/dL Final   Alkaline Phosphatase 02/25/2021 36 (A) 39 - 117 U/L Final   AST 02/25/2021 18  0 - 37 U/L Final   ALT 02/25/2021 15  0 - 35 U/L Final   Total Protein 02/25/2021 7.0  6.0 - 8.3 g/dL Final   Albumin 02/25/2021 4.1  3.5 - 5.2 g/dL Final   GFR 02/25/2021 77.68  >60.00 mL/min Final   Calculated using the CKD-EPI Creatinine Equation (2021)   Calcium 02/25/2021 9.9  8.4 - 10.5 mg/dL Final   Hgb A1c MFr Bld 02/25/2021 6.4  4.6 - 6.5 % Final   Glycemic Control Guidelines for People with  Diabetes:Non Diabetic:  <6%Goal of Therapy: <7%Additional Action Suggested:  >8%       Past Medical History:  Diagnosis Date   Abnormal finding on EKG, new anterolateral T-wave inversions  05/14/2012   Abnormal nuclear stress test, with infero-lateral ischemia 05/14/2012   Arthritis    "right thumb; all my fingers" (05/13/2012), cervical spondylosis    Atypical angina ([place]) 05/14/2012   CAD (coronary artery disease), 05/13/12, with 80% LAD 05/14/2012   Cancer ([place])    basal cell on facial in L temporal region     [facility] arthritis, thumb, degenerative    Complication of anesthesia 12/2009   "had endoscopy; larynx went into spasms; stopped breathing for 15 seconds" (05/13/2012)   GERD (gastroesophageal reflux disease) 2011   Hypercholesteremia    Hypertension    S/P angioplasty with stent, LAD 05/13/12 05/14/2012   Tuberculosis    test +- [place] [facility] - Dr. [name]- CXR- OK    Past Surgical History:  Procedure Laterality Date   ANTERIOR CERVICAL DECOMP/DISCECTOMY FUSION N/A 12/13/2013   Procedure: ANTERIOR CERVICAL DECOMPRESSION/DISCECTOMY FUSION 3 LEVELS Cervical four/five,five/six,six/seven. Anterior cervical disectomy and fusion with peek and plate ;  Surgeon: [name], MD;  Location: [place] [facility]URO ORS;  Service: Neurosurgery;  Laterality: N/A;   CARDIAC CATHETERIZATION N/A 07/02/2015   Procedure: Left Heart Cath and Coronary Angiography;  Surgeon: [name], MD;  Location: [place] CV LAB;  Service: Cardiovascular;  Laterality: N/A;   CORONARY ANGIOPLASTY WITH STENT PLACEMENT  05/13/2012   DES to LAD; she has total RCA with left to rt coll. normal LV function done for positive nuc study   DILATION AND CURETTAGE OF UTERUS  1960's; 1970's; 1980   "probably 3" (05/13/2012)   FOREARM / WRIST TENDON LESION EXCISION  ?11/2001   "right; did waver to try to get ulnar out of hole that it had cut" (05/13/2012   INGUINAL HERNIA REPAIR  ~ 1966; 07/21/2002   "right; left" (05/13/2012)    LEFT HEART CATHETERIZATION WITH CORONARY ANGIOGRAM N/A 05/13/2012   Procedure: LEFT HEART CATHETERIZATION WITH CORONARY ANGIOGRAM;  Surgeon: [name], MD;  Location[facility] CATH LAB;  Service: Cardiovascular;  Laterality: N/A;   OSTEOTOMY AND ULNAR SHORTENING  07/21/2002   "right" (05/13/2012)   TONSILLECTOMY AND ADENOIDECTOMY  1950's   TUBAL LIGATION  1980    Family History  Problem Relation Age of Onset   Hypertension Mother    Diabetes Mother    Hyperlipidemia Father    Heart disease Father    Stroke Father    Heart disease Sister    Diabetes Sister  Heart disease Brother    Diabetes Maternal Aunt     Social History:  reports that she quit smoking about 32 years ago. Her smoking use included cigarettes. She has a 10.00 pack-year smoking history. She has never used smokeless tobacco. She reports current alcohol use of about 12.0 standard drinks per week. She reports that she does not use drugs.  Allergies:  Allergies  Allergen Reactions   Statins Other (See Comments)    "intermittent loss of circulation; hands and arms will go to sleep; feet will get cramps; fatigue" (05/13/2012).  This occurred with Lipitor, Zocor, Crestor 5 mg qd, and she thinks pravastatin as well   Amlodipine Itching   Gadolinium Derivatives Itching and Nausea Only    Pt had severe nausea and itching on legs, neck and back, per dr [name] pt needs 13 hour prep before contrast in the future    Allergies as of 02/28/2021       Reactions   Statins Other (See Comments)   "intermittent loss of circulation; hands and arms will go to sleep; feet will get cramps; fatigue" (05/13/2012).  This occurred with Lipitor, Zocor, Crestor 5 mg qd, and she thinks pravastatin as well   Amlodipine Itching   Gadolinium Derivatives Itching, Nausea Only   Pt had severe nausea and itching on legs, neck and back, per dr [name] pt needs 13 hour prep before contrast in the future        Medication List        Accurate  as of February 28, 2021 10:44 AM. If you have any questions, ask your nurse or doctor.          acetaminophen 500 MG tablet Commonly known as: TYLENOL Take 500 mg by mouth every 6 (six) hours as needed for mild pain or headache.   aspirin 81 MG tablet Take 81 mg by mouth daily.   calcium-vitamin D 500-200 MG-UNIT tablet Commonly known as: OSCAL WITH D Take 1 tablet by mouth daily as needed.   carvedilol 12.5 MG tablet Commonly known as: COREG Take 12.5 mg by mouth 2 (two) times daily with a meal.   clopidogrel 75 MG tablet Commonly known as: PLAVIX TAKE 1 TABLET(75 MG) BY MOUTH DAILY   diclofenac sodium 1 % Gel Commonly known as: VOLTAREN Apply 2 g topically daily as needed. For pain   Dilt-XR 240 MG 24 hr capsule Generic drug: diltiazem Take 240 mg by mouth daily.   ezetimibe 10 MG tablet Commonly known as: ZETIA TAKE 1 TABLET(10 MG) BY MOUTH DAILY   fenofibrate 160 MG tablet TAKE 1 TABLET(160 MG) BY MOUTH DAILY What changed: See the new instructions.   glucose blood test strip Commonly known as: OneTouch Verio Use to test blood sugar once daily Dx code E11.9   hydrocortisone valerate cream 0.2 % Commonly known as: WESTCORT Apply 1 application topically daily as needed (ITCHING).   irbesartan 300 MG tablet Commonly known as: AVAPRO TAKE 1/2 TABLET BY MOUTH TWICE DAILY IN THE MORNING AND AT BEDTIME   irbesartan 300 MG tablet Commonly known as: AVAPRO TAKE 1/2 TABLET BY MOUTH TWICE DAILY( IN THE MORNING AND AT BEDTIME)   levothyroxine 25 MCG tablet Commonly known as: SYNTHROID TAKE 1 1/2 TABLET BY MOUTH DAILY BEFORE BREAKFAST   metFORMIN 500 MG 24 hr tablet Commonly known as: GLUCOPHAGE-XR TAKE 2 TABLETS BY MOUTH DAILY WITH SUPPER   multivitamin with minerals Tabs tablet Take 1 tablet by mouth daily.   nitroGLYCERIN 0.4 MG SL  tablet Commonly known as: NITROSTAT PLACE 1 TABLET UNDER THE TONGUE EVERY 5 MINUTES AS NEEDED FOR CHEST PAIN   omega-3  acid ethyl esters 1 g capsule Commonly known as: LOVAZA Take 2 capsules (2 g total) by mouth 2 (two) times daily.   PRESERVISION AREDS 2+MULTI VIT PO Take 1 tablet by mouth in the morning and at bedtime.   Repatha SureClick 562 MG/ML [place] Generic drug: Evolocumab INJECT 1 PEN UNDER THE SKIN EVERY 14 DAYS   triamterene-hydrochlorothiazide 37.5-25 MG tablet Commonly known as: MAXZIDE-25 Take 1 tablet by mouth daily. Patient taking  1/4 tablet   Vitamin D3 50 MCG (2000 UT) Tabs Take 2,000 Units by mouth 2 (two) times daily.        LABS:  Lab on 02/25/2021  Component Date Value Ref Range Status   Free T4 02/25/2021 1.16  0.60 - 1.60 ng/dL Final   Comment: Specimens from patients who are undergoing biotin therapy and /or ingesting biotin supplements may contain high levels of biotin.  The higher biotin concentration in these specimens interferes with this Free T4 assay.  Specimens that contain high levels  of biotin may cause false high results for this Free T4 assay.  Please interpret results in light of the total clinical presentation of the patient.     TSH 02/25/2021 2.68  0.35 - 5.50 uIU/mL Final   Sodium 02/25/2021 132 (A) 135 - 145 mEq/L Final   Potassium 02/25/2021 4.2  3.5 - 5.1 mEq/L Final   Chloride 02/25/2021 99  96 - 112 mEq/L Final   CO2 02/25/2021 25  19 - 32 mEq/L Final   Glucose, Bld 02/25/2021 119 (A) 70 - 99 mg/dL Final   BUN 02/25/2021 16  6 - 23 mg/dL Final   Creatinine, Ser 02/25/2021 0.74  0.40 - 1.20 mg/dL Final   Total Bilirubin 02/25/2021 0.4  0.2 - 1.2 mg/dL Final   Alkaline Phosphatase 02/25/2021 36 (A) 39 - 117 U/L Final   AST 02/25/2021 18  0 - 37 U/L Final   ALT 02/25/2021 15  0 - 35 U/L Final   Total Protein 02/25/2021 7.0  6.0 - 8.3 g/dL Final   Albumin 02/25/2021 4.1  3.5 - 5.2 g/dL Final   GFR 02/25/2021 77.68  >60.00 mL/min Final   Calculated using the CKD-EPI Creatinine Equation (2021)   Calcium 02/25/2021 9.9  8.4 - 10.5 mg/dL Final    Hgb A1c MFr Bld 02/25/2021 6.4  4.6 - 6.5 % Final   Glycemic Control Guidelines for People with Diabetes:Non Diabetic:  <6%Goal of Therapy: <7%Additional Action Suggested:  >8%       Review of Systems    OSTEOPENIA:   Lowest T score is at the right neck femur previously -2.4  This is now only 0.8 as of 11/14/2019  However BMD at the left femoral neck is last -2.0 compared to -1.8  Apparently had tried Fosamax in the past and this caused some GI side effects She received RECLAST infusion on 12/15/17 and again on 01/07/2020 She will have her next infusion in 12/2021  Vitamin D deficiency: She is taking OTC vitamin D 4000 U daily  Last vitamin D level is 39.3  HYPERTENSION: Treated with Avapro, diltiazem, maxzide quarter tablet and Coreg, Followed by PCP.  Home BP has been checked also  History of hyperlipidemia with some intolerance to statins, on Repatha and also on Zetia.   Has high triglycerides, apparently previously having muscle aches and is very cold fenofibrate but has  not had follow-up labs   Last fasting lipids as follows  Lab Results  Component Value Date   CHOL 162 10/29/2020   HDL 47 10/29/2020   LDLCALC 76 10/29/2020   LDLDIRECT 130.1 04/04/2014   TRIG 239 (H) 10/29/2020   CHOLHDL 3.4 10/29/2020      HYPOTHYROIDISM: TSH level previously has been as high as 5.6  This spontaneously improved but in 05/2018 was mildly increased again  At that time she was complaining of some fatigue, constipation, dry skin, some cold intolerance and difficulty losing weight.  She is taking levothyroxine 37.5 mcg daily, she did have some improvement in her fatigue with increasing her dose   Her TSH is back to normal again, previously at 4.9  Lab Results  Component Value Date   TSH 2.68 02/25/2021   TSH 3.15 10/23/2020   TSH 4.92 (H) 07/17/2020   FREET4 1.16 02/25/2021   FREET4 1.21 10/23/2020   FREET4 1.08 07/17/2020     PHYSICAL EXAM:  BP 122/70   Pulse 83   Ht  5\' 1"  (1.549 m)   Wt 135 lb (61.2 kg)   SpO2 97%   BMI 25.51 kg/m      ASSESSMENT:   Prediabetes with impaired fasting glucose and also impaired glucose tolerance  Most of her A1c and blood sugars in the past have been in the prediabetic range  Transiently she has had high blood sugars over 125 and A1c up to 6.7 partly from steroids  Her A1c is now fairly well controlled at 6 .4  She is on 1000 mg of Metformin at dinnertime which she is now not tolerating because of reported heartburn  Home blood sugars are generally mildly increased over 100 compared to before Lab glucose 119 fasting   HYPOTHYROIDISM: With increasing her dose of levothyroxine up to 37.5 mcg she has had more consistent control and TSH normal  She may have had some improvement in her fatigue with increasing the dose  Hypertriglyceridemia: She needs to request her PCP to follow-up on the labs  Advised her to take the new booster for COVID, reassured her that this is an important for her because of her age and comorbid conditions She will also have her influenza vaccine at her PCP office at upcoming visit  Mild hyponatremia: Asymptomatic and likely to be from HCTZ  PLAN:   She can try taking 500 mg Metformin at breakfast and another dose at dinnertime to see if this is better tolerable Otherwise we may consider Farxiga Her monitor was set to the correct date and time  She will continue 1-1/2 tablets of 25 mcg levothyroxine and have labs done periodically  She will continue 4000 units of vitamin D supplement and calcium for osteopenia   Follow-up in  4 months     Brittany Figueroa 02/28/2021, 10:44 AM

## 2021-02-28 NOTE — Patient Instructions (Signed)
Take Metformin 1 am and 1 pm with food

## 2021-03-12 DIAGNOSIS — H353122 Nonexudative age-related macular degeneration, left eye, intermediate dry stage: Secondary | ICD-10-CM | POA: Diagnosis not present

## 2021-03-18 DIAGNOSIS — I2510 Atherosclerotic heart disease of native coronary artery without angina pectoris: Secondary | ICD-10-CM | POA: Diagnosis not present

## 2021-03-18 DIAGNOSIS — E039 Hypothyroidism, unspecified: Secondary | ICD-10-CM | POA: Diagnosis not present

## 2021-03-18 DIAGNOSIS — E782 Mixed hyperlipidemia: Secondary | ICD-10-CM | POA: Diagnosis not present

## 2021-03-18 DIAGNOSIS — I10 Essential (primary) hypertension: Secondary | ICD-10-CM | POA: Diagnosis not present

## 2021-03-29 DIAGNOSIS — L820 Inflamed seborrheic keratosis: Secondary | ICD-10-CM | POA: Diagnosis not present

## 2021-03-29 DIAGNOSIS — Z08 Encounter for follow-up examination after completed treatment for malignant neoplasm: Secondary | ICD-10-CM | POA: Diagnosis not present

## 2021-03-29 DIAGNOSIS — Z85828 Personal history of other malignant neoplasm of skin: Secondary | ICD-10-CM | POA: Diagnosis not present

## 2021-03-29 DIAGNOSIS — L249 Irritant contact dermatitis, unspecified cause: Secondary | ICD-10-CM | POA: Diagnosis not present

## 2021-03-29 DIAGNOSIS — L298 Other pruritus: Secondary | ICD-10-CM | POA: Diagnosis not present

## 2021-03-29 DIAGNOSIS — L538 Other specified erythematous conditions: Secondary | ICD-10-CM | POA: Diagnosis not present

## 2021-04-04 ENCOUNTER — Encounter (INDEPENDENT_AMBULATORY_CARE_PROVIDER_SITE_OTHER): Payer: Self-pay | Admitting: Ophthalmology

## 2021-04-04 ENCOUNTER — Ambulatory Visit (INDEPENDENT_AMBULATORY_CARE_PROVIDER_SITE_OTHER): Payer: Medicare Other | Admitting: Ophthalmology

## 2021-04-04 ENCOUNTER — Other Ambulatory Visit: Payer: Self-pay

## 2021-04-04 DIAGNOSIS — H353132 Nonexudative age-related macular degeneration, bilateral, intermediate dry stage: Secondary | ICD-10-CM | POA: Diagnosis not present

## 2021-04-04 DIAGNOSIS — H353211 Exudative age-related macular degeneration, right eye, with active choroidal neovascularization: Secondary | ICD-10-CM | POA: Diagnosis not present

## 2021-04-04 MED ORDER — BEVACIZUMAB 2.5 MG/0.1ML IZ SOSY
2.5000 mg | PREFILLED_SYRINGE | INTRAVITREAL | Status: AC | PRN
  Administered 2021-04-04: 14:00:00 2.5 mg via INTRAVITREAL

## 2021-04-04 NOTE — Progress Notes (Signed)
04/04/2021     CHIEF COMPLAINT Patient presents for  Chief Complaint  Patient presents with   Retina Follow Up      HISTORY OF PRESENT ILLNESS: Brittany Figueroa is a 80 y.o. female who presents to the clinic today for:   HPI     Retina Follow Up   Patient presents with  Wet AMD.  In both eyes.  This started 10 weeks ago.  Duration of 10 weeks.  Since onset it is stable.        Comments   10 week f/u OU with OCT and possible Avastin injection OD  Pt c/o significant irritation to the right eye following her previous injection.      Last edited by Reather Littler, COA on 04/04/2021  1:02 PM.      Referring physician: Lajean Manes, [place]. [address] [place],  [place] [address]  HISTORICAL INFORMATION:   Selected notes from the MEDICAL RECORD NUMBER    Lab Results  Component Value Date   HGBA1C 6.4 02/25/2021     CURRENT MEDICATIONS: No current outpatient medications on file. (Ophthalmic Drugs)   No current facility-administered medications for this visit. (Ophthalmic Drugs)   Current Outpatient Medications (Other)  Medication Sig   acetaminophen (TYLENOL) 500 MG tablet Take 500 mg by mouth every 6 (six) hours as needed for mild pain or headache.   aspirin 81 MG tablet Take 81 mg by mouth daily.   calcium-vitamin D (OSCAL WITH D) 500-200 MG-UNIT per tablet Take 1 tablet by mouth daily as needed.   carvedilol (COREG) 12.5 MG tablet Take 12.5 mg by mouth 2 (two) times daily with a meal.   Cholecalciferol (VITAMIN D3) 2000 UNITS TABS Take 2,000 Units by mouth 2 (two) times daily.   clopidogrel (PLAVIX) 75 MG tablet TAKE 1 TABLET(75 MG) BY MOUTH DAILY   diclofenac sodium (VOLTAREN) 1 % GEL Apply 2 g topically daily as needed. For pain   DILT-XR 240 MG 24 hr capsule Take 240 mg by mouth daily.   ezetimibe (ZETIA) 10 MG tablet TAKE 1 TABLET(10 MG) BY MOUTH DAILY   fenofibrate 160 MG tablet TAKE 1 TABLET(160 MG) BY MOUTH DAILY (Patient taking  differently: Take 80 mg by mouth daily.)   glucose blood (ONETOUCH VERIO) test strip Use to test blood sugar once daily Dx code E11.9   hydrocortisone valerate cream (WESTCORT) 0.2 % Apply 1 application topically daily as needed (ITCHING).   irbesartan (AVAPRO) 300 MG tablet TAKE 1/2 TABLET BY MOUTH TWICE DAILY IN THE MORNING AND AT BEDTIME   irbesartan (AVAPRO) 300 MG tablet TAKE 1/2 TABLET BY MOUTH TWICE DAILY( IN THE MORNING AND AT BEDTIME)   levothyroxine (SYNTHROID) 25 MCG tablet TAKE 1 1/2 TABLET BY MOUTH DAILY BEFORE BREAKFAST   metFORMIN (GLUCOPHAGE-XR) 500 MG 24 hr tablet TAKE 2 TABLETS BY MOUTH DAILY WITH SUPPER   Multiple Vitamin (MULTIVITAMIN WITH MINERALS) TABS Take 1 tablet by mouth daily.   Multiple Vitamins-Minerals (PRESERVISION AREDS 2+MULTI VIT PO) Take 1 tablet by mouth in the morning and at bedtime.   nitroGLYCERIN (NITROSTAT) 0.4 MG SL tablet PLACE 1 TABLET UNDER THE TONGUE EVERY 5 MINUTES AS NEEDED FOR CHEST PAIN   omega-3 acid ethyl esters (LOVAZA) 1 g capsule Take 2 capsules (2 g total) by mouth 2 (two) times daily.   REPATHA SURECLICK 470 MG/ML SOAJ INJECT 1 PEN UNDER THE SKIN EVERY 14 DAYS   triamterene-hydrochlorothiazide (MAXZIDE-25) 37.5-25 MG tablet Take 1  tablet by mouth daily. Patient taking  1/4 tablet   No current facility-administered medications for this visit. (Other)      REVIEW OF SYSTEMS:    ALLERGIES Allergies  Allergen Reactions   Statins Other (See Comments)    "intermittent loss of circulation; hands and arms will go to sleep; feet will get cramps; fatigue" (05/13/2012).  This occurred with Lipitor, Zocor, Crestor 5 mg qd, and she thinks pravastatin as well   Amlodipine Itching   Gadolinium Derivatives Itching and Nausea Only    Pt had severe nausea and itching on legs, neck and back, per dr [name] pt needs 13 hour prep before contrast in the future    PAST MEDICAL HISTORY Past Medical History:  Diagnosis Date   Abnormal finding on  EKG, new anterolateral T-wave inversions  05/14/2012   Abnormal nuclear stress test, with infero-lateral ischemia 05/14/2012   Arthritis    "right thumb; all my fingers" (05/13/2012), cervical spondylosis    Atypical angina ([place]) 05/14/2012   CAD (coronary artery disease), 05/13/12, with 80% LAD 05/14/2012   Cancer ([place])    basal cell on facial in L temporal region     [facility] arthritis, thumb, degenerative    Complication of anesthesia 12/2009   "had endoscopy; larynx went into spasms; stopped breathing for 15 seconds" (05/13/2012)   GERD (gastroesophageal reflux disease) 2011   Hypercholesteremia    Hypertension    S/P angioplasty with stent, LAD 05/13/12 05/14/2012   Tuberculosis    test +- [facility] - Dr. [name]- CXR- OK   Past Surgical History:  Procedure Laterality Date   ANTERIOR CERVICAL DECOMP/DISCECTOMY FUSION N/A 12/13/2013   Procedure: ANTERIOR CERVICAL DECOMPRESSION/DISCECTOMY FUSION 3 LEVELS Cervical four/five,five/six,six/seven. Anterior cervical disectomy and fusion with peek and plate ;  Surgeon: [name], MD;  Location: [facility];  Service: Neurosurgery;  Laterality: N/A;   CARDIAC CATHETERIZATION N/A 07/02/2015   Procedure: Left Heart Cath and Coronary Angiography;  Surgeon: [name], MD;  Location: [facility] CV LAB;  Service: Cardiovascular;  Laterality: N/A;   CORONARY ANGIOPLASTY WITH STENT PLACEMENT  05/13/2012   DES to LAD; she has total RCA with left to rt coll. normal LV function done for positive nuc study   DILATION AND CURETTAGE OF UTERUS  1960's; 1970's; 1980   "probably 3" (05/13/2012)   FOREARM / WRIST TENDON LESION EXCISION  ?11/2001   "right; did waver to try to get ulnar out of hole that it had cut" (05/13/2012   INGUINAL HERNIA REPAIR  ~ 1966; 07/21/2002   "right; left" (05/13/2012)   LEFT HEART CATHETERIZATION WITH CORONARY ANGIOGRAM N/A 05/13/2012   Procedure: LEFT HEART CATHETERIZATION WITH CORONARY ANGIOGRAM;  Surgeon: [name],  MD;  Location: [facility] LAB;  Service: Cardiovascular;  Laterality: N/A;   OSTEOTOMY AND ULNAR SHORTENING  07/21/2002   "right" (05/13/2012)   TONSILLECTOMY AND ADENOIDECTOMY  1950's   TUBAL LIGATION  1980    FAMILY HISTORY Family History  Problem Relation Age of Onset   Hypertension Mother    Diabetes Mother    Hyperlipidemia Father    Heart disease Father    Stroke Father    Heart disease Sister    Diabetes Sister    Heart disease Brother    Diabetes Maternal Aunt     SOCIAL HISTORY Social History   Tobacco Use   Smoking status: Former    Packs/day: 0.50    Years: 20.00    Pack years: 10.00  Types: Cigarettes    Quit date: 07/05/1988    Years since quitting: 32.7   Smokeless tobacco: Never  Substance Use Topics   Alcohol use: Yes    Alcohol/week: 12.0 standard drinks    Types: 12 Glasses of wine per week    Comment: 05/13/2012 "6-8 oz glass of red wine q hs"    Drug use: No         OPHTHALMIC EXAM:  Base Eye Exam     Visual Acuity (ETDRS)       Right Left   Dist cc 20/20 20/20 -2    Correction: Glasses         Tonometry (Tonopen, 1:07 PM)       Right Left   Pressure 14 14         Pupils       Pupils Dark Light Shape React APD   Right PERRL 4 3 Round Brisk None   Left PERRL 4 3 Round Brisk None         Visual Fields (Counting fingers)       Left Right    Full Full         Extraocular Movement       Right Left    Full, Ortho Full, Ortho         Neuro/Psych     Oriented x3: Yes   Mood/Affect: Normal         Dilation     Both eyes: 1.0% Mydriacyl, 2.5% Phenylephrine @ 1:07 PM           Slit Lamp and Fundus Exam     External Exam       Right Left   External Normal Normal         Slit Lamp Exam       Right Left   Lids/Lashes Normal Normal   Conjunctiva/Sclera White and quiet White and quiet   Cornea Clear Clear   Anterior Chamber Deep and quiet Deep and quiet   Iris Round and reactive Round and  reactive   Lens 2+ Nuclear sclerosis 2+ Nuclear sclerosis   Anterior Vitreous Normal, no cells Normal         Fundus Exam       Right Left   Posterior Vitreous Normal,  no cells Normal   Disc Normal Normal   C/D Ratio 0.4 0.45   Macula Hard drusen, Microaneurysms, no macular thickening, no hemorrhage Hard drusen, Intermediate age related macular degeneration, no hemorrhage, no macular thickening   Vessels Normal Normal   Periphery Normal Normal            IMAGING AND PROCEDURES  Imaging and Procedures for 04/04/21  OCT, Retina - OU - Both Eyes       Right Eye Quality was good. Scan locations included subfoveal. Central Foveal Thickness: 244. Progression has improved. Findings include abnormal foveal contour, no SRF, no IRF.   Left Eye Quality was good. Scan locations included subfoveal. Central Foveal Thickness: 278. Progression has been stable. Findings include abnormal foveal contour.   Notes OD, much improved anatomy overall as compared to February 2022 onset of CNVM with intraretinal and subretinal fluid.  No signs of recurrences.  Today at 10 weeks post most recent examination and injection OD      Intravitreal Injection, Pharmacologic Agent - OD - Right Eye       Time Out 04/04/2021. 1:43 PM. Confirmed correct patient, procedure, site, and patient consented.   Anesthesia Topical  anesthesia was used. Anesthetic medications included Lidocaine 4%.   Procedure Preparation included Tobramycin 0.3%, 10% betadine to eyelids, 5% betadine to ocular surface. A 30 gauge needle was used.   Injection: 2.5 mg bevacizumab 2.5 MG/0.1ML   Route: Intravitreal, Site: Right Eye   NDC: [id number], Lot: [id number]   Post-op Post injection exam found visual acuity of at least counting fingers. The patient tolerated the procedure well. There were no complications. The patient received written and verbal post procedure care education. Post injection medications included  ocuflox.              ASSESSMENT/PLAN:  Exudative age-related macular degeneration of right eye with active choroidal neovascularization (HCC) The nature of wet macular degeneration was discussed with the patient.  Forms of therapy reviewed include the use of Anti-VEGF medications injected painlessly into the eye, as well as other possible treatment modalities, including thermal laser therapy. Fellow eye involvement and risks were discussed with the patient. Upon the finding of wet age related macular degeneration, treatment will be offered. The treatment regimen is on a treat as needed basis with the intent to treat if necessary and extend interval of exams when possible. On average 1 out of 6 patients do not need lifetime therapy. However, the risk of recurrent disease is high for a lifetime.  Initially monthly, then periodic, examinations and evaluations will determine whether the next treatment is required on the day of the examination.  OD, vastly improved since onset of disease February 2022, currently at 10-week follow-up interval post Avastin follow-up in 3 months after Avastin injection today  Intermediate stage nonexudative age-related macular degeneration of both eyes No sign of CNVM OS     ICD-10-CM   1. Exudative age-related macular degeneration of right eye with active choroidal neovascularization (HCC)  H35.3211 OCT, Retina - OU - Both Eyes    Intravitreal Injection, Pharmacologic Agent - OD - Right Eye    bevacizumab (AVASTIN) SOSY 2.5 mg    2. Intermediate stage nonexudative age-related macular degeneration of both eyes  H35.3132       1.  OD much improved macular findings and acuity since onset of therapy for wet AMD February 2022.  Follow-up today at 10-week interval post injection Avastin, condition is remained stable we will repeat injection today follow-up next OD in 3 months  2.  Dilate OU next  3.  Ophthalmic Meds Ordered this visit:  Meds ordered this  encounter  Medications   bevacizumab (AVASTIN) SOSY 2.5 mg       Return in about 3 months (around 07/05/2021) for DILATE OU, AVASTIN OCT, OD.  There are no Patient Instructions on file for this visit.   Explained the diagnoses, plan, and follow up with the patient and they expressed understanding.  Patient expressed understanding of the importance of proper follow up care.   Clent Demark Fatime Biswell M.D. Diseases & Surgery of the Retina and Vitreous [facility]ina [facility] Di[facility] [place] 04/04/21     Abbreviations: M myopia (nearsighted); A astigmatism; H hyperopia (farsighted); P presbyopia; Mrx spectacle prescription;  CTL contact lenses; OD right eye; OS left eye; OU both eyes  XT exotropia; ET esotropia; PEK punctate epithelial keratitis; PEE punctate epithelial erosions; DES dry eye syndrome; MGD meibomian gland dysfunction; ATs artificial tears; PFAT's preservative free artificial tears; [place] nuclear sclerotic cataract; PSC posterior subcapsular cataract; ERM epi-retinal membrane; PVD posterior vitreous detachment; RD retinal detachment; DM diabetes mellitus; DR diabetic retinopathy; NPDR non-proliferative diabetic retinopathy; PDR proliferative diabetic retinopathy; CSME  clinically significant macular edema; DME diabetic macular edema; dbh dot blot hemorrhages; CWS cotton wool spot; POAG primary open angle glaucoma; C/D cup-to-disc ratio; HVF humphrey visual field; GVF goldmann visual field; OCT optical coherence tomography; IOP intraocular pressure; BRVO Branch retinal vein occlusion; CRVO central retinal vein occlusion; CRAO central retinal artery occlusion; BRAO branch retinal artery occlusion; RT retinal tear; SB scleral buckle; PPV pars plana vitrectomy; VH Vitreous hemorrhage; PRP panretinal laser photocoagulation; IVK intravitreal kenalog; VMT vitreomacular traction; MH Macular hole;  NVD neovascularization of the disc; NVE neovascularization elsewhere; AREDS age related eye disease study; ARMD age  related macular degeneration; POAG primary open angle glaucoma; EBMD epithelial/anterior basement membrane dystrophy; ACIOL anterior chamber intraocular lens; IOL intraocular lens; PCIOL posterior chamber intraocular lens; Phaco/IOL phacoemulsification with intraocular lens placement; [place] photorefractive keratectomy; LASIK laser assisted in situ keratomileusis; HTN hypertension; DM diabetes mellitus; COPD chronic obstructive pulmonary disease

## 2021-04-04 NOTE — Assessment & Plan Note (Signed)
No sign of CNVM OS 

## 2021-04-04 NOTE — Assessment & Plan Note (Addendum)
The nature of wet macular degeneration was discussed with the patient.  Forms of therapy reviewed include the use of Anti-VEGF medications injected painlessly into the eye, as well as other possible treatment modalities, including thermal laser therapy. Fellow eye involvement and risks were discussed with the patient. Upon the finding of wet age related macular degeneration, treatment will be offered. The treatment regimen is on a treat as needed basis with the intent to treat if necessary and extend interval of exams when possible. On average 1 out of 6 patients do not need lifetime therapy. However, the risk of recurrent disease is high for a lifetime.  Initially monthly, then periodic, examinations and evaluations will determine whether the next treatment is required on the day of the examination.  OD, vastly improved since onset of disease February 2022, currently at 10-week follow-up interval post Avastin follow-up in 3 months after Avastin injection today

## 2021-04-11 DIAGNOSIS — H353122 Nonexudative age-related macular degeneration, left eye, intermediate dry stage: Secondary | ICD-10-CM | POA: Diagnosis not present

## 2021-04-18 ENCOUNTER — Ambulatory Visit
Admission: RE | Admit: 2021-04-18 | Discharge: 2021-04-18 | Disposition: A | Discharge status: Home / Self Care | Payer: Medicare Other | Source: Ambulatory Visit | Attending: Geriatric Medicine | Admitting: Geriatric Medicine

## 2021-04-18 ENCOUNTER — Other Ambulatory Visit: Payer: Self-pay | Admitting: Geriatric Medicine

## 2021-04-18 DIAGNOSIS — M79644 Pain in right finger(s): Secondary | ICD-10-CM | POA: Diagnosis not present

## 2021-04-18 DIAGNOSIS — R7303 Prediabetes: Secondary | ICD-10-CM | POA: Diagnosis not present

## 2021-04-18 DIAGNOSIS — Z1389 Encounter for screening for other disorder: Secondary | ICD-10-CM | POA: Diagnosis not present

## 2021-04-18 DIAGNOSIS — H353211 Exudative age-related macular degeneration, right eye, with active choroidal neovascularization: Secondary | ICD-10-CM | POA: Diagnosis not present

## 2021-04-18 DIAGNOSIS — R059 Cough, unspecified: Secondary | ICD-10-CM | POA: Diagnosis not present

## 2021-04-18 DIAGNOSIS — Z0000 Encounter for general adult medical examination without abnormal findings: Secondary | ICD-10-CM | POA: Diagnosis not present

## 2021-04-18 DIAGNOSIS — E782 Mixed hyperlipidemia: Secondary | ICD-10-CM | POA: Diagnosis not present

## 2021-04-18 DIAGNOSIS — Z79899 Other long term (current) drug therapy: Secondary | ICD-10-CM | POA: Diagnosis not present

## 2021-04-18 DIAGNOSIS — I700 Atherosclerosis of aorta: Secondary | ICD-10-CM | POA: Diagnosis not present

## 2021-04-18 DIAGNOSIS — E559 Vitamin D deficiency, unspecified: Secondary | ICD-10-CM | POA: Diagnosis not present

## 2021-04-18 DIAGNOSIS — I10 Essential (primary) hypertension: Secondary | ICD-10-CM | POA: Diagnosis not present

## 2021-04-18 DIAGNOSIS — E039 Hypothyroidism, unspecified: Secondary | ICD-10-CM | POA: Diagnosis not present

## 2021-04-29 ENCOUNTER — Other Ambulatory Visit: Payer: Self-pay | Admitting: Endocrinology

## 2021-05-01 ENCOUNTER — Encounter (INDEPENDENT_AMBULATORY_CARE_PROVIDER_SITE_OTHER): Payer: Self-pay

## 2021-05-01 DIAGNOSIS — H04123 Dry eye syndrome of bilateral lacrimal glands: Secondary | ICD-10-CM | POA: Diagnosis not present

## 2021-05-01 DIAGNOSIS — H25013 Cortical age-related cataract, bilateral: Secondary | ICD-10-CM | POA: Diagnosis not present

## 2021-05-01 DIAGNOSIS — H2513 Age-related nuclear cataract, bilateral: Secondary | ICD-10-CM | POA: Diagnosis not present

## 2021-05-01 DIAGNOSIS — H353132 Nonexudative age-related macular degeneration, bilateral, intermediate dry stage: Secondary | ICD-10-CM | POA: Diagnosis not present

## 2021-05-09 DIAGNOSIS — I10 Essential (primary) hypertension: Secondary | ICD-10-CM | POA: Diagnosis not present

## 2021-05-09 DIAGNOSIS — E039 Hypothyroidism, unspecified: Secondary | ICD-10-CM | POA: Diagnosis not present

## 2021-05-09 DIAGNOSIS — I2510 Atherosclerotic heart disease of native coronary artery without angina pectoris: Secondary | ICD-10-CM | POA: Diagnosis not present

## 2021-05-09 DIAGNOSIS — E782 Mixed hyperlipidemia: Secondary | ICD-10-CM | POA: Diagnosis not present

## 2021-05-11 DIAGNOSIS — H353122 Nonexudative age-related macular degeneration, left eye, intermediate dry stage: Secondary | ICD-10-CM | POA: Diagnosis not present

## 2021-05-23 ENCOUNTER — Other Ambulatory Visit: Payer: Self-pay | Admitting: Cardiovascular Disease

## 2021-05-23 DIAGNOSIS — H353211 Exudative age-related macular degeneration, right eye, with active choroidal neovascularization: Secondary | ICD-10-CM | POA: Diagnosis not present

## 2021-05-23 DIAGNOSIS — H2513 Age-related nuclear cataract, bilateral: Secondary | ICD-10-CM | POA: Diagnosis not present

## 2021-05-23 DIAGNOSIS — H1045 Other chronic allergic conjunctivitis: Secondary | ICD-10-CM | POA: Diagnosis not present

## 2021-05-23 DIAGNOSIS — H353121 Nonexudative age-related macular degeneration, left eye, early dry stage: Secondary | ICD-10-CM | POA: Diagnosis not present

## 2021-05-27 DIAGNOSIS — Z4789 Encounter for other orthopedic aftercare: Secondary | ICD-10-CM | POA: Diagnosis not present

## 2021-05-27 DIAGNOSIS — M79644 Pain in right finger(s): Secondary | ICD-10-CM | POA: Diagnosis not present

## 2021-05-27 DIAGNOSIS — M65321 Trigger finger, right index finger: Secondary | ICD-10-CM | POA: Diagnosis not present

## 2021-05-27 DIAGNOSIS — M13849 Other specified arthritis, unspecified hand: Secondary | ICD-10-CM | POA: Diagnosis not present

## 2021-05-27 DIAGNOSIS — M67441 Ganglion, right hand: Secondary | ICD-10-CM | POA: Diagnosis not present

## 2021-06-03 ENCOUNTER — Other Ambulatory Visit (INDEPENDENT_AMBULATORY_CARE_PROVIDER_SITE_OTHER): Payer: Medicare Other

## 2021-06-03 ENCOUNTER — Other Ambulatory Visit: Payer: Self-pay

## 2021-06-03 DIAGNOSIS — E559 Vitamin D deficiency, unspecified: Secondary | ICD-10-CM

## 2021-06-03 DIAGNOSIS — E039 Hypothyroidism, unspecified: Secondary | ICD-10-CM

## 2021-06-03 DIAGNOSIS — R7303 Prediabetes: Secondary | ICD-10-CM

## 2021-06-03 LAB — COMPREHENSIVE METABOLIC PANEL
ALT: 12 U/L (ref 0–35)
AST: 15 U/L (ref 0–37)
Albumin: 4.4 g/dL (ref 3.5–5.2)
Alkaline Phosphatase: 42 U/L (ref 39–117)
BUN: 17 mg/dL (ref 6–23)
CO2: 27 mEq/L (ref 19–32)
Calcium: 9.7 mg/dL (ref 8.4–10.5)
Chloride: 100 mEq/L (ref 96–112)
Creatinine, Ser: 0.69 mg/dL (ref 0.40–1.20)
GFR: 83.17 mL/min (ref >60.00)
Glucose, Bld: 118 mg/dL — ABNORMAL HIGH (ref 70–99)
Potassium: 4.4 mEq/L (ref 3.5–5.1)
Sodium: 134 mEq/L — ABNORMAL LOW (ref 135–145)
Total Bilirubin: 0.4 mg/dL (ref 0.2–1.2)
Total Protein: 7.3 g/dL (ref 6.0–8.3)

## 2021-06-03 LAB — HEMOGLOBIN A1C: Hgb A1c MFr Bld: 6.3 % (ref 4.6–6.5)

## 2021-06-03 LAB — VITAMIN D 25 HYDROXY (VIT D DEFICIENCY, FRACTURES): VITD: 61.39 ng/mL (ref 30.00–100.00)

## 2021-06-03 LAB — T4, FREE: Free T4: 1.29 ng/dL (ref 0.60–1.60)

## 2021-06-03 LAB — TSH: TSH: 3.05 u[IU]/mL (ref 0.35–5.50)

## 2021-06-04 ENCOUNTER — Other Ambulatory Visit: Payer: Medicare Other

## 2021-06-06 ENCOUNTER — Other Ambulatory Visit: Payer: Self-pay

## 2021-06-06 ENCOUNTER — Encounter: Payer: Self-pay | Admitting: Endocrinology

## 2021-06-06 ENCOUNTER — Ambulatory Visit: Payer: Medicare Other | Admitting: Endocrinology

## 2021-06-06 VITALS — BP 144/82 | HR 82 | Ht 61.0 in | Wt 135.0 lb

## 2021-06-06 DIAGNOSIS — E039 Hypothyroidism, unspecified: Secondary | ICD-10-CM | POA: Diagnosis not present

## 2021-06-06 DIAGNOSIS — M8580 Other specified disorders of bone density and structure, unspecified site: Secondary | ICD-10-CM | POA: Diagnosis not present

## 2021-06-06 DIAGNOSIS — Z780 Asymptomatic menopausal state: Secondary | ICD-10-CM

## 2021-06-06 DIAGNOSIS — R7303 Prediabetes: Secondary | ICD-10-CM | POA: Diagnosis not present

## 2021-06-06 DIAGNOSIS — E871 Hypo-osmolality and hyponatremia: Secondary | ICD-10-CM | POA: Diagnosis not present

## 2021-06-06 NOTE — Progress Notes (Signed)
Patient ID: Brittany Figueroa, female   DOB: 28-Nov-1940, 81 y.o.   MRN: [id number]           Chief complaint: Endocrinology follow-up    History of Present Illness:  PREVIOUS history: She may have been told about 10 years ago that she had prediabetes and reportedly had a normal glucose tolerance test. She was told by her PCP that she has type 2 diabetes based on an A1c of 6.5% in 07/2013 She has not had any other A1c is subsequently above normal, normal range and previous lab 6.2 or less She also has not had any abnormal blood sugars except a glucose of 125 in 3/16  Has history of increased fasting blood sugars ranging from 108-125 since about 2012  GLUCOSE TOLERANCE test on 05/23/15: Fasting glucose 110, two-hour glucose 192  RECENT history:   She has impaired fasting glucose and glucose intolerance  She has been intermittently on metformin ER since about 08/2015 In 1/18 when A1c was 6.5 and glucose 133 fasting she was told to start back on metformin and also improve her diet She was taking 1500 mg a day but could not tolerate this because of feeling of bloating  Current regimen: Metformin ER 500 mg twice daily   A1c is 6.3, previously 6.4  Current blood sugar readings, management and history    She is able to tolerate metformin better with splitting of the dose to twice a day instead of taking 1 tablet in the evening  She has less heartburn which she is managing symptomatically  Generally she is watching her diet most of the time and most of her blood sugars are fairly good  Not able to exercise because of foot pain Her weight has leveled off She likes to check her sugars mostly in the mornings about once or twice a week   Using freestyle neo monitor   PRE-MEAL Fasting Lunch Dinner Bedtime Overall  Glucose range: 91-113 106 92    Mean/median: 104    110   POST-MEAL PC Breakfast PC Lunch PC Dinner  Glucose range:  166   Mean/median:        PREVIOUS range 97-117  FASTING and 89, 104 before dinner AVERAGE 108  Weight history: Previous range 139-143  Wt Readings from Last 3 Encounters:  06/06/21 135 lb (61.2 kg)  02/28/21 135 lb (61.2 kg)  11/14/20 136 lb (61.7 kg)    Lab Results  Component Value Date   HGBA1C 6.3 06/03/2021   HGBA1C 6.4 02/25/2021   HGBA1C 6.0 10/23/2020   Lab Results  Component Value Date   MICROALBUR <0.7 07/17/2020   LDLCALC 76 10/29/2020   CREATININE 0.69 06/03/2021   HYPOTHYROIDISM and other problems reviewed today: See review of systems    Lab on 06/03/2021  Component Date Value Ref Range Status   VITD 06/03/2021 61.39  30.00 - 100.00 ng/mL Final   Free T4 06/03/2021 1.29  0.60 - 1.60 ng/dL Final   Comment: Specimens from patients who are undergoing biotin therapy and /or ingesting biotin supplements may contain high levels of biotin.  The higher biotin concentration in these specimens interferes with this Free T4 assay.  Specimens that contain high levels  of biotin may cause false high results for this Free T4 assay.  Please interpret results in light of the total clinical presentation of the patient.     TSH 06/03/2021 3.05  0.35 - 5.50 uIU/mL Final   Sodium 06/03/2021 134 (L)  135 - 145  mEq/L Final   Potassium 06/03/2021 4.4  3.5 - 5.1 mEq/L Final   Chloride 06/03/2021 100  96 - 112 mEq/L Final   CO2 06/03/2021 27  19 - 32 mEq/L Final   Glucose, Bld 06/03/2021 118 (H)  70 - 99 mg/dL Final   BUN 06/03/2021 17  6 - 23 mg/dL Final   Creatinine, Ser 06/03/2021 0.69  0.40 - 1.20 mg/dL Final   Total Bilirubin 06/03/2021 0.4  0.2 - 1.2 mg/dL Final   Alkaline Phosphatase 06/03/2021 42  39 - 117 U/L Final   AST 06/03/2021 15  0 - 37 U/L Final   ALT 06/03/2021 12  0 - 35 U/L Final   Total Protein 06/03/2021 7.3  6.0 - 8.3 g/dL Final   Albumin 06/03/2021 4.4  3.5 - 5.2 g/dL Final   GFR 06/03/2021 83.17  >60.00 mL/min Final   Calculated using the CKD-EPI Creatinine Equation (2021)   Calcium 06/03/2021 9.7  8.4 -  10.5 mg/dL Final   Hgb A1c MFr Bld 06/03/2021 6.3  4.6 - 6.5 % Final   Glycemic Control Guidelines for People with Diabetes:Non Diabetic:  <6%Goal of Therapy: <7%Additional Action Suggested:  >8%       Past Medical History:  Diagnosis Date   Abnormal finding on EKG, new anterolateral T-wave inversions  05/14/2012   Abnormal nuclear stress test, with infero-lateral ischemia 05/14/2012   Arthritis    "right thumb; all my fingers" (05/13/2012), cervical spondylosis    Atypical angina ([place]) 05/14/2012   CAD (coronary artery disease), 05/13/12, with 80% LAD 05/14/2012   Cancer ([place])    basal cell on facial in L temporal region     CMC arthritis, thumb, degenerative    Complication of anesthesia 12/2009   "had endoscopy; larynx went into spasms; stopped breathing for 15 seconds" (05/13/2012)   GERD (gastroesophageal reflux disease) 2011   Hypercholesteremia    Hypertension    S/P angioplasty with stent, LAD 05/13/12 05/14/2012   Tuberculosis    test +- [place] [facility] - Dr. [name]- CXR- OK    Past Surgical History:  Procedure Laterality Date   ANTERIOR CERVICAL DECOMP/DISCECTOMY FUSION N/A 12/13/2013   Procedure: ANTERIOR CERVICAL DECOMPRESSION/DISCECTOMY FUSION 3 LEVELS Cervical four/five,five/six,six/seven. Anterior cervical disectomy and fusion with peek and plate ;  Surgeon: [name], MD;  Location: [facility];  Service: Neurosurgery;  Laterality: N/A;   CARDIAC CATHETERIZATION N/A 07/02/2015   Procedure: Left Heart Cath and Coronary Angiography;  Surgeon: [name], MD;  Location: [facility];  Service: Cardiovascular;  Laterality: N/A;   CORONARY ANGIOPLASTY WITH STENT PLACEMENT  05/13/2012   DES to LAD; she has total RCA with left to rt coll. normal LV function done for positive nuc study   DILATION AND CURETTAGE OF UTERUS  1960's; 1970's; 1980   "probably 3" (05/13/2012)   FOREARM / WRIST TENDON LESION EXCISION  ?11/2001   "right; did waver to try to get ulnar out  of hole that it had cut" (05/13/2012   INGUINAL HERNIA REPAIR  ~ 1966; 07/21/2002   "right; left" (05/13/2012)   LEFT HEART CATHETERIZATION WITH CORONARY ANGIOGRAM N/A 05/13/2012   Procedure: LEFT HEART CATHETERIZATION WITH CORONARY ANGIOGRAM;  Surgeon: [name], MD;  Location: [facility];  Service: Cardiovascular;  Laterality: N/A;   OSTEOTOMY AND ULNAR SHORTENING  07/21/2002   "right" (05/13/2012)   TONSILLECTOMY AND ADENOIDECTOMY  1950's   TUBAL LIGATION  1980    Family History  Problem Relation  Age of Onset   Hypertension Mother    Diabetes Mother    Hyperlipidemia Father    Heart disease Father    Stroke Father    Heart disease Sister    Diabetes Sister    Heart disease Brother    Diabetes Maternal Aunt     Social History:  reports that she quit smoking about 32 years ago. Her smoking use included cigarettes. She has a 10.00 pack-year smoking history. She has never used smokeless tobacco. She reports current alcohol use of about 12.0 standard drinks per week. She reports that she does not use drugs.  Allergies:  Allergies  Allergen Reactions   Statins Other (See Comments)    "intermittent loss of circulation; hands and arms will go to sleep; feet will get cramps; fatigue" (05/13/2012).  This occurred with Lipitor, Zocor, Crestor 5 mg qd, and she thinks pravastatin as well   Amlodipine Itching   Gadolinium Derivatives Itching and Nausea Only    Pt had severe nausea and itching on legs, neck and back, per dr [name] pt needs 13 hour prep before contrast in the future    Allergies as of 06/06/2021       Reactions   Statins Other (See Comments)   "intermittent loss of circulation; hands and arms will go to sleep; feet will get cramps; fatigue" (05/13/2012).  This occurred with Lipitor, Zocor, Crestor 5 mg qd, and she thinks pravastatin as well   Amlodipine Itching   Gadolinium Derivatives Itching, Nausea Only   Pt had severe nausea and itching on legs, neck and back,  per dr [name] pt needs 13 hour prep before contrast in the future        Medication List        Accurate as of June 06, 2021 11:11 AM. If you have any questions, ask your nurse or doctor.          acetaminophen 500 MG tablet Commonly known as: TYLENOL Take 500 mg by mouth every 6 (six) hours as needed for mild pain or headache.   aspirin 81 MG tablet Take 81 mg by mouth daily.   calcium-vitamin D 500-200 MG-UNIT tablet Commonly known as: OSCAL WITH D Take 1 tablet by mouth daily as needed.   carvedilol 12.5 MG tablet Commonly known as: COREG Take 12.5 mg by mouth 2 (two) times daily with a meal.   clopidogrel 75 MG tablet Commonly known as: PLAVIX TAKE 1 TABLET(75 MG) BY MOUTH DAILY   diclofenac sodium 1 % Gel Commonly known as: VOLTAREN Apply 2 g topically daily as needed. For pain   Dilt-XR 240 MG 24 hr capsule Generic drug: diltiazem Take 240 mg by mouth daily.   ezetimibe 10 MG tablet Commonly known as: ZETIA TAKE 1 TABLET(10 MG) BY MOUTH DAILY   fenofibrate 160 MG tablet TAKE 1 TABLET(160 MG) BY MOUTH DAILY   glucose blood test strip Commonly known as: OneTouch Verio Use to test blood sugar once daily Dx code E11.9   hydrocortisone valerate cream 0.2 % Commonly known as: WESTCORT Apply 1 application topically daily as needed (ITCHING).   irbesartan 300 MG tablet Commonly known as: AVAPRO TAKE 1/2 TABLET BY MOUTH TWICE DAILY IN THE MORNING AND AT BEDTIME   irbesartan 300 MG tablet Commonly known as: AVAPRO TAKE 1/2 TABLET BY MOUTH TWICE DAILY( IN THE MORNING AND AT BEDTIME)   levothyroxine 25 MCG tablet Commonly known as: SYNTHROID TAKE 1 AND 1/2 TABLETS BY MOUTH DAILY BEFORE BREAKFAST  metFORMIN 500 MG 24 hr tablet Commonly known as: GLUCOPHAGE-XR TAKE 2 TABLETS BY MOUTH DAILY WITH SUPPER   multivitamin with minerals Tabs tablet Take 1 tablet by mouth daily.   nitroGLYCERIN 0.4 MG SL tablet Commonly known as: NITROSTAT PLACE 1  TABLET UNDER THE TONGUE EVERY 5 MINUTES AS NEEDED FOR CHEST PAIN   omega-3 acid ethyl esters 1 g capsule Commonly known as: LOVAZA Take 2 capsules (2 g total) by mouth 2 (two) times daily.   PRESERVISION AREDS 2+MULTI VIT PO Take 1 tablet by mouth in the morning and at bedtime.   Repatha SureClick 884 MG/ML [place] Generic drug: Evolocumab INJECT 1 PEN UNDER THE SKIN EVERY 14 DAYS   triamterene-hydrochlorothiazide 37.5-25 MG tablet Commonly known as: MAXZIDE-25 Take 1 tablet by mouth daily. Patient taking  1/4 tablet   Vitamin D3 50 MCG (2000 UT) Tabs Take 2,000 Units by mouth 2 (two) times daily.        LABS:  Lab on 06/03/2021  Component Date Value Ref Range Status   VITD 06/03/2021 61.39  30.00 - 100.00 ng/mL Final   Free T4 06/03/2021 1.29  0.60 - 1.60 ng/dL Final   Comment: Specimens from patients who are undergoing biotin therapy and /or ingesting biotin supplements may contain high levels of biotin.  The higher biotin concentration in these specimens interferes with this Free T4 assay.  Specimens that contain high levels  of biotin may cause false high results for this Free T4 assay.  Please interpret results in light of the total clinical presentation of the patient.     TSH 06/03/2021 3.05  0.35 - 5.50 uIU/mL Final   Sodium 06/03/2021 134 (L)  135 - 145 mEq/L Final   Potassium 06/03/2021 4.4  3.5 - 5.1 mEq/L Final   Chloride 06/03/2021 100  96 - 112 mEq/L Final   CO2 06/03/2021 27  19 - 32 mEq/L Final   Glucose, Bld 06/03/2021 118 (H)  70 - 99 mg/dL Final   BUN 06/03/2021 17  6 - 23 mg/dL Final   Creatinine, Ser 06/03/2021 0.69  0.40 - 1.20 mg/dL Final   Total Bilirubin 06/03/2021 0.4  0.2 - 1.2 mg/dL Final   Alkaline Phosphatase 06/03/2021 42  39 - 117 U/L Final   AST 06/03/2021 15  0 - 37 U/L Final   ALT 06/03/2021 12  0 - 35 U/L Final   Total Protein 06/03/2021 7.3  6.0 - 8.3 g/dL Final   Albumin 06/03/2021 4.4  3.5 - 5.2 g/dL Final   GFR 06/03/2021 83.17   >60.00 mL/min Final   Calculated using the CKD-EPI Creatinine Equation (2021)   Calcium 06/03/2021 9.7  8.4 - 10.5 mg/dL Final   Hgb A1c MFr Bld 06/03/2021 6.3  4.6 - 6.5 % Final   Glycemic Control Guidelines for People with Diabetes:Non Diabetic:  <6%Goal of Therapy: <7%Additional Action Suggested:  >8%       Review of Systems    OSTEOPENIA:   Lowest T score is at the right neck femur previously -2.4  This is now only 0.8 as of 11/14/2019  However BMD at the left femoral neck is last -2.0 compared to -1.8  Apparently had tried Fosamax in the past and this caused some GI side effects She received RECLAST infusion on 12/15/17 and again on 01/07/2020 She will have her next infusion in 12/2021  Vitamin D deficiency: She is taking OTC vitamin D 4000 U daily  Last vitamin D level is 39.3, now 61  HYPERTENSION: Treated with Avapro, diltiazem, maxzide quarter tablet and Coreg, Followed by PCP.  Home BP has been checked also  HYPERLIPIDEMIA: Followed by cardiology History of hyperlipidemia with some intolerance to statins, on Repatha and also on Zetia.   Has high triglycerides, has been back on fenofibrate   Last fasting lipids as follows  Lab Results  Component Value Date   CHOL 162 10/29/2020   HDL 47 10/29/2020   LDLCALC 76 10/29/2020   LDLDIRECT 130.1 04/04/2014   TRIG 239 (H) 10/29/2020   CHOLHDL 3.4 10/29/2020      HYPOTHYROIDISM: TSH level previously has been as high as 5.6  This spontaneously improved but in 05/2018 was mildly increased again  At that time she was complaining of some fatigue, constipation, dry skin, some cold intolerance and difficulty losing weight.  She is taking levothyroxine 37.5 mcg daily, she did have some improvement in her fatigue with the higher dose   Her TSH is back to normal again, previously at 4.9  Lab Results  Component Value Date   TSH 3.05 06/03/2021   TSH 2.68 02/25/2021   TSH 3.15 10/23/2020   FREET4 1.29 06/03/2021    FREET4 1.16 02/25/2021   FREET4 1.21 10/23/2020     PHYSICAL EXAM:  BP (!) 144/82    Pulse 82    Ht 5\' 1"  (1.549 m)    Wt 135 lb (61.2 kg)    SpO2 97%    BMI 25.51 kg/m      ASSESSMENT:   Prediabetes with impaired fasting glucose and also impaired glucose tolerance  Most of her A1c and blood sugars in the past have been in the prediabetic range  Transiently she has had high blood sugars over 125 and A1c up to 6.7 partly from steroids  Her A1c is now fairly well controlled at 6 .4  She is on 500 mg of Metformin twice a day  She has maintain her diet and weight Blood sugars are averaging about 104 at home fasting Does not appear to have consistently higher postprandial readings Lab glucose 118 fasting   HYPOTHYROIDISM: With increasing her dose of levothyroxine up to 37.5 mcg she has had more consistent control and TSH normal  She may have had some improvement in her fatigue with increasing the dose  Hypertriglyceridemia: She needs to request her cardiologist to follow-up on the labs  Mild hyponatremia: Very mild and likely to be from HCTZ  OSTEOPENIA: Has had 2 infusions of Reclast  PLAN:   She low-dose metformin unchanged Encouraged her to start exercise when she is able to  She will continue 1-1/2 tablets of 25 mcg levothyroxine and have labs done on the next visit continue  She will reduce her dose down to 2000 units of vitamin D supplement since her level is high normal Meanwhile continue calcium for osteopenia  Will consider bone density for follow-up later this year, due for Reclast in August  Follow-up in  4 months     Glorious Flicker 06/06/2021, 11:11 AM

## 2021-06-06 NOTE — Patient Instructions (Signed)
Take 2000 U of Vitamin D3

## 2021-07-09 ENCOUNTER — Ambulatory Visit (INDEPENDENT_AMBULATORY_CARE_PROVIDER_SITE_OTHER): Payer: Medicare Other | Admitting: Ophthalmology

## 2021-07-09 ENCOUNTER — Other Ambulatory Visit: Payer: Self-pay

## 2021-07-09 ENCOUNTER — Encounter (INDEPENDENT_AMBULATORY_CARE_PROVIDER_SITE_OTHER): Payer: Self-pay | Admitting: Ophthalmology

## 2021-07-09 DIAGNOSIS — H353132 Nonexudative age-related macular degeneration, bilateral, intermediate dry stage: Secondary | ICD-10-CM | POA: Diagnosis not present

## 2021-07-09 DIAGNOSIS — H2513 Age-related nuclear cataract, bilateral: Secondary | ICD-10-CM | POA: Diagnosis not present

## 2021-07-09 DIAGNOSIS — H353211 Exudative age-related macular degeneration, right eye, with active choroidal neovascularization: Secondary | ICD-10-CM | POA: Diagnosis not present

## 2021-07-09 MED ORDER — BEVACIZUMAB 2.5 MG/0.1ML IZ SOSY
2.5000 mg | PREFILLED_SYRINGE | INTRAVITREAL | Status: AC | PRN
  Administered 2021-07-09: 2.5 mg via INTRAVITREAL

## 2021-07-09 NOTE — Assessment & Plan Note (Signed)
OD, slight recurrence with intraretinal fluid superior to FAZ but protected in good acuity.  Repeat injection Avastin today at 7-month interval and will shorten interval examinations in the right eye next to 8 weeks

## 2021-07-09 NOTE — Assessment & Plan Note (Signed)
Patient reports of on going impairment of activities of daily (and nightly) living particularly with driving at night.  The degree of color change with her current [place] changes in each eye are consistent with this and visual functioning and nighttime and low light situations could improve in my opinion with cataract surgery

## 2021-07-09 NOTE — Progress Notes (Signed)
07/09/2021     CHIEF COMPLAINT Patient presents for  Chief Complaint  Patient presents with   Macular Degeneration      HISTORY OF PRESENT ILLNESS: Brittany Figueroa is a 81 y.o. female who presents to the clinic today for:   HPI   3 mos dilate OU, Avastin OD, oct. Patient states vision is stable and unchanged since last visit. Denies any new floaters or FOL. Pt states she has a 2nd opinion appt to Dr. Talbert Forest for cataracts. Pt states she has a problem with night driving and glare. Pt request copy of todays visit be sent to Dr. Lucianne Lei and Dr. Sharren Bridge.  Last edited by Laurin Coder on 07/09/2021  1:31 PM.      Referring physician: Stephannie Li, [place],  [place] [id number]  HISTORICAL INFORMATION:   Selected notes from the MEDICAL RECORD NUMBER    Lab Results  Component Value Date   HGBA1C 6.3 06/03/2021     CURRENT MEDICATIONS: No current outpatient medications on file. (Ophthalmic Drugs)   No current facility-administered medications for this visit. (Ophthalmic Drugs)   Current Outpatient Medications (Other)  Medication Sig   acetaminophen (TYLENOL) 500 MG tablet Take 500 mg by mouth every 6 (six) hours as needed for mild pain or headache.   aspirin 81 MG tablet Take 81 mg by mouth daily.   calcium-vitamin D (OSCAL WITH D) 500-200 MG-UNIT per tablet Take 1 tablet by mouth daily as needed.   carvedilol (COREG) 12.5 MG tablet Take 12.5 mg by mouth 2 (two) times daily with a meal.   Cholecalciferol (VITAMIN D3) 2000 UNITS TABS Take 2,000 Units by mouth 2 (two) times daily.   clopidogrel (PLAVIX) 75 MG tablet TAKE 1 TABLET(75 MG) BY MOUTH DAILY   diclofenac sodium (VOLTAREN) 1 % GEL Apply 2 g topically daily as needed. For pain   DILT-XR 240 MG 24 hr capsule Take 240 mg by mouth daily.   ezetimibe (ZETIA) 10 MG tablet TAKE 1 TABLET(10 MG) BY MOUTH DAILY   fenofibrate 160 MG tablet TAKE 1 TABLET(160 MG) BY MOUTH DAILY   glucose blood (ONETOUCH  VERIO) test strip Use to test blood sugar once daily Dx code E11.9   hydrocortisone valerate cream (WESTCORT) 0.2 % Apply 1 application topically daily as needed (ITCHING).   irbesartan (AVAPRO) 300 MG tablet TAKE 1/2 TABLET BY MOUTH TWICE DAILY IN THE MORNING AND AT BEDTIME   irbesartan (AVAPRO) 300 MG tablet TAKE 1/2 TABLET BY MOUTH TWICE DAILY( IN THE MORNING AND AT BEDTIME)   levothyroxine (SYNTHROID) 25 MCG tablet TAKE 1 AND 1/2 TABLETS BY MOUTH DAILY BEFORE BREAKFAST   metFORMIN (GLUCOPHAGE-XR) 500 MG 24 hr tablet TAKE 2 TABLETS BY MOUTH DAILY WITH SUPPER   Multiple Vitamin (MULTIVITAMIN WITH MINERALS) TABS Take 1 tablet by mouth daily.   Multiple Vitamins-Minerals (PRESERVISION AREDS 2+MULTI VIT PO) Take 1 tablet by mouth in the morning and at bedtime.   nitroGLYCERIN (NITROSTAT) 0.4 MG SL tablet PLACE 1 TABLET UNDER THE TONGUE EVERY 5 MINUTES AS NEEDED FOR CHEST PAIN   omega-3 acid ethyl esters (LOVAZA) 1 g capsule Take 2 capsules (2 g total) by mouth 2 (two) times daily.   REPATHA SURECLICK 740 MG/ML SOAJ INJECT 1 PEN UNDER THE SKIN EVERY 14 DAYS   triamterene-hydrochlorothiazide (MAXZIDE-25) 37.5-25 MG tablet Take 1 tablet by mouth daily. Patient taking  1/4 tablet   No current facility-administered medications for this visit. (Other)  REVIEW OF SYSTEMS: ROS   Negative for: Constitutional, Gastrointestinal, Neurological, Skin, Genitourinary, Musculoskeletal, HENT, Endocrine, Cardiovascular, Eyes, Respiratory, Psychiatric, Allergic/Imm, Heme/Lymph Last edited by Hurman Horn, MD on 07/09/2021  1:59 PM.       ALLERGIES Allergies  Allergen Reactions   Statins Other (See Comments)    "intermittent loss of circulation; hands and arms will go to sleep; feet will get cramps; fatigue" (05/13/2012).  This occurred with Lipitor, Zocor, Crestor 5 mg qd, and she thinks pravastatin as well   Amlodipine Itching   Gadolinium Derivatives Itching and Nausea Only    Pt had severe  nausea and itching on legs, neck and back, per dr [name] pt needs 13 hour prep before contrast in the future    PAST MEDICAL HISTORY Past Medical History:  Diagnosis Date   Abnormal finding on EKG, new anterolateral T-wave inversions  05/14/2012   Abnormal nuclear stress test, with infero-lateral ischemia 05/14/2012   Arthritis    "right thumb; all my fingers" (05/13/2012), cervical spondylosis    Atypical angina ([place]) 05/14/2012   CAD (coronary artery disease), 05/13/12, with 80% LAD 05/14/2012   Cancer ([place])    basal cell on facial in L temporal region     [facility] arthritis, thumb, degenerative    Complication of anesthesia 12/2009   "had endoscopy; larynx went into spasms; stopped breathing for 15 seconds" (05/13/2012)   GERD (gastroesophageal reflux disease) 2011   Hypercholesteremia    Hypertension    S/P angioplasty with stent, LAD 05/13/12 05/14/2012   Tuberculosis    test +- [facility] - Dr. [name]- CXR- OK   Past Surgical History:  Procedure Laterality Date   ANTERIOR CERVICAL DECOMP/DISCECTOMY FUSION N/A 12/13/2013   Procedure: ANTERIOR CERVICAL DECOMPRESSION/DISCECTOMY FUSION 3 LEVELS Cervical four/five,five/six,six/seven. Anterior cervical disectomy and fusion with peek and plate ;  Surgeon: [name], MD;  Location: [facility]URO ORS;  Service: Neurosurgery;  Laterality: N/A;   CARDIAC CATHETERIZATION N/A 07/02/2015   Procedure: Left Heart Cath and Coronary Angiography;  Surgeon: [name], MD;  Location: [facility] CV LAB;  Service: Cardiovascular;  Laterality: N/A;   CORONARY ANGIOPLASTY WITH STENT PLACEMENT  05/13/2012   DES to LAD; she has total RCA with left to rt coll. normal LV function done for positive nuc study   DILATION AND CURETTAGE OF UTERUS  1960's; 1970's; 1980   "probably 3" (05/13/2012)   FOREARM / WRIST TENDON LESION EXCISION  ?11/2001   "right; did waver to try to get ulnar out of hole that it had cut" (05/13/2012   INGUINAL HERNIA REPAIR  ~ 1966;  07/21/2002   "right; left" (05/13/2012)   LEFT HEART CATHETERIZATION WITH CORONARY ANGIOGRAM N/A 05/13/2012   Procedure: LEFT HEART CATHETERIZATION WITH CORONARY ANGIOGRAM;  Surgeon: [name], MD;  Location: [facility] C[facility] LAB;  Service: Cardiovascular;  Laterality: N/A;   OSTEOTOMY AND ULNAR SHORTENING  07/21/2002   "right" (05/13/2012)   TONSILLECTOMY AND ADENOIDECTOMY  1950's   TUBAL LIGATION  1980    FAMILY HISTORY Family History  Problem Relation Age of Onset   Hypertension Mother    Diabetes Mother    Hyperlipidemia Father    Heart disease Father    Stroke Father    Heart disease Sister    Diabetes Sister    Heart disease Brother    Diabetes Maternal Aunt     SOCIAL HISTORY Social History   Tobacco Use   Smoking status: Former    Packs/day: 0.50  Years: 20.00    Pack years: 10.00    Types: Cigarettes    Quit date: 07/05/1988    Years since quitting: 33.0   Smokeless tobacco: Never  Substance Use Topics   Alcohol use: Yes    Alcohol/week: 12.0 standard drinks    Types: 12 Glasses of wine per week    Comment: 05/13/2012 "6-8 oz glass of red wine q hs"    Drug use: No         OPHTHALMIC EXAM:  Base Eye Exam     Visual Acuity (ETDRS)       Right Left   Dist cc 20/25 +1 20/20    Correction: Glasses         Tonometry (Tonopen, 1:35 PM)       Right Left   Pressure 16 12         Pupils       Pupils Dark Light APD   Right PERRL 4 3 None   Left PERRL 4 3 None         Extraocular Movement       Right Left    Full Full         Neuro/Psych     Oriented x3: Yes   Mood/Affect: Normal         Dilation     Both eyes: 1.0% Mydriacyl, 2.5% Phenylephrine @ 1:35 PM           Slit Lamp and Fundus Exam     External Exam       Right Left   External Normal Normal         Slit Lamp Exam       Right Left   Lids/Lashes Normal Normal   Conjunctiva/Sclera White and quiet White and quiet   Cornea Clear Clear   Anterior  Chamber Deep and quiet Deep and quiet   Iris Round and reactive Round and reactive   Lens 2+ Nuclear sclerosis 2+ Nuclear sclerosis   Anterior Vitreous Normal, no cells Normal         Fundus Exam       Right Left   Posterior Vitreous Normal,  no cells Normal   Disc Normal Normal   C/D Ratio 0.4 0.45   Macula Hard drusen, Microaneurysms, no macular thickening, no hemorrhage Hard drusen, Intermediate age related macular degeneration, no hemorrhage, no macular thickening   Vessels Normal Normal   Periphery Normal Normal            IMAGING AND PROCEDURES  Imaging and Procedures for 07/09/21  Intravitreal Injection, Pharmacologic Agent - OD - Right Eye       Time Out 07/09/2021. 2:01 PM. Confirmed correct patient, procedure, site, and patient consented.   Anesthesia Topical anesthesia was used. Anesthetic medications included Lidocaine 4%.   Procedure Preparation included Tobramycin 0.3%, 10% betadine to eyelids, 5% betadine to ocular surface. A 30 gauge needle was used.   Injection: 2.5 mg bevacizumab 2.5 MG/0.1ML   Route: Intravitreal, Site: Right Eye   NDC: [phone], Lot: [id number]   Post-op Post injection exam found visual acuity of at least counting fingers. The patient tolerated the procedure well. There were no complications. The patient received written and verbal post procedure care education. Post injection medications included ocuflox.      OCT, Retina - OU - Both Eyes       Right Eye Quality was good. Scan locations included subfoveal. Central Foveal Thickness: 272. Progression has improved. Findings include abnormal  foveal contour, no SRF, no IRF, cystoid macular edema.   Left Eye Quality was good. Scan locations included subfoveal. Central Foveal Thickness: 283. Progression has been stable. Findings include abnormal foveal contour.   Notes OD, much improved anatomy overall as compared to February 2022 onset of CNVM with intraretinal and subretinal  fluid.  Yet small amount of subretinal fluid is newly found superior to FAZ.  Today at 3 months post most recent examination and injection OD, and will need follow-up order interval, 8 weeks              ASSESSMENT/PLAN:  Nuclear sclerotic cataract of both eyes Patient reports of on going impairment of activities of daily (and nightly) living particularly with driving at night.  The degree of color change with her current [place] changes in each eye are consistent with this and visual functioning and nighttime and low light situations could improve in my opinion with cataract surgery  Intermediate stage nonexudative age-related macular degeneration of both eyes No sign of CNVM OS today  Exudative age-related macular degeneration of right eye with active choroidal neovascularization (HCC) OD, slight recurrence with intraretinal fluid superior to FAZ but protected in good acuity.  Repeat injection Avastin today at 72-month interval and will shorten interval examinations in the right eye next to 8 weeks     ICD-10-CM   1. Exudative age-related macular degeneration of right eye with active choroidal neovascularization (HCC)  H35.3211 Intravitreal Injection, Pharmacologic Agent - OD - Right Eye    OCT, Retina - OU - Both Eyes    bevacizumab (AVASTIN) SOSY 2.5 mg    2. Nuclear sclerotic cataract of both eyes  H25.13     3. Intermediate stage nonexudative age-related macular degeneration of both eyes  H35.3132       1.  OD with slight recurrence of intraretinal fluid from CNVM at longer interval of examination today, 3 months.  We will repeat injection Avastin OD today and will examination again in 8 weeks  2.  OU okay to proceed with cataract surgery as patient has nighttime driving symptoms coincident with a degree of nuclear sclerotic change in color changes each lens  3.  Ophthalmic Meds Ordered this visit:  Meds ordered this encounter  Medications   bevacizumab (AVASTIN) SOSY 2.5  mg       Return in about 8 weeks (around 09/03/2021) for DILATE OU, AVASTIN OCT, OD.  There are no Patient Instructions on file for this visit.   Explained the diagnoses, plan, and follow up with the patient and they expressed understanding.  Patient expressed understanding of the importance of proper follow up care.   Clent Demark Darielle Hancher M.D. Diseases & Surgery of the Retina and Vitreous [facility] [place] 07/09/21     Abbreviations: M myopia (nearsighted); A astigmatism; H hyperopia (farsighted); P presbyopia; Mrx spectacle prescription;  CTL contact lenses; OD right eye; OS left eye; OU both eyes  XT exotropia; ET esotropia; PEK punctate epithelial keratitis; PEE punctate epithelial erosions; DES dry eye syndrome; MGD meibomian gland dysfunction; ATs artificial tears; PFAT's preservative free artificial tears; [place] nuclear sclerotic cataract; PSC posterior subcapsular cataract; ERM epi-retinal membrane; PVD posterior vitreous detachment; RD retinal detachment; DM diabetes mellitus; DR diabetic retinopathy; NPDR non-proliferative diabetic retinopathy; PDR proliferative diabetic retinopathy; CSME clinically significant macular edema; DME diabetic macular edema; dbh dot blot hemorrhages; CWS cotton wool spot; POAG primary open angle glaucoma; C/D cup-to-disc ratio; HVF humphrey visual field; GVF goldmann visual field; OCT optical  coherence tomography; IOP intraocular pressure; BRVO Branch retinal vein occlusion; CRVO central retinal vein occlusion; CRAO central retinal artery occlusion; BRAO branch retinal artery occlusion; RT retinal tear; SB scleral buckle; PPV pars plana vitrectomy; VH Vitreous hemorrhage; PRP panretinal laser photocoagulation; IVK intravitreal kenalog; VMT vitreomacular traction; MH Macular hole;  NVD neovascularization of the disc; NVE neovascularization elsewhere; AREDS age related eye disease study; ARMD age related macular degeneration; POAG primary open angle  glaucoma; EBMD epithelial/anterior basement membrane dystrophy; ACIOL anterior chamber intraocular lens; IOL intraocular lens; PCIOL posterior chamber intraocular lens; Phaco/IOL phacoemulsification with intraocular lens placement; [place] photorefractive keratectomy; LASIK laser assisted in situ keratomileusis; HTN hypertension; DM diabetes mellitus; COPD chronic obstructive pulmonary disease

## 2021-07-09 NOTE — Assessment & Plan Note (Signed)
No sign of CNVM OS today 

## 2021-07-10 DIAGNOSIS — H353122 Nonexudative age-related macular degeneration, left eye, intermediate dry stage: Secondary | ICD-10-CM | POA: Diagnosis not present

## 2021-07-10 DIAGNOSIS — I10 Essential (primary) hypertension: Secondary | ICD-10-CM | POA: Diagnosis not present

## 2021-07-10 DIAGNOSIS — E782 Mixed hyperlipidemia: Secondary | ICD-10-CM | POA: Diagnosis not present

## 2021-07-16 DIAGNOSIS — H2513 Age-related nuclear cataract, bilateral: Secondary | ICD-10-CM | POA: Diagnosis not present

## 2021-07-16 DIAGNOSIS — K219 Gastro-esophageal reflux disease without esophagitis: Secondary | ICD-10-CM | POA: Diagnosis not present

## 2021-07-16 DIAGNOSIS — H25013 Cortical age-related cataract, bilateral: Secondary | ICD-10-CM | POA: Diagnosis not present

## 2021-07-16 DIAGNOSIS — H353131 Nonexudative age-related macular degeneration, bilateral, early dry stage: Secondary | ICD-10-CM | POA: Diagnosis not present

## 2021-07-16 DIAGNOSIS — H18413 Arcus senilis, bilateral: Secondary | ICD-10-CM | POA: Diagnosis not present

## 2021-07-16 DIAGNOSIS — I10 Essential (primary) hypertension: Secondary | ICD-10-CM | POA: Diagnosis not present

## 2021-07-17 ENCOUNTER — Other Ambulatory Visit: Payer: Self-pay | Admitting: Endocrinology

## 2021-07-29 ENCOUNTER — Other Ambulatory Visit: Payer: Self-pay | Admitting: Endocrinology

## 2021-08-06 DIAGNOSIS — L814 Other melanin hyperpigmentation: Secondary | ICD-10-CM | POA: Diagnosis not present

## 2021-08-06 DIAGNOSIS — D485 Neoplasm of uncertain behavior of skin: Secondary | ICD-10-CM | POA: Diagnosis not present

## 2021-08-06 DIAGNOSIS — L538 Other specified erythematous conditions: Secondary | ICD-10-CM | POA: Diagnosis not present

## 2021-08-06 DIAGNOSIS — Z08 Encounter for follow-up examination after completed treatment for malignant neoplasm: Secondary | ICD-10-CM | POA: Diagnosis not present

## 2021-08-06 DIAGNOSIS — L820 Inflamed seborrheic keratosis: Secondary | ICD-10-CM | POA: Diagnosis not present

## 2021-08-06 DIAGNOSIS — Z85828 Personal history of other malignant neoplasm of skin: Secondary | ICD-10-CM | POA: Diagnosis not present

## 2021-08-06 DIAGNOSIS — D225 Melanocytic nevi of trunk: Secondary | ICD-10-CM | POA: Diagnosis not present

## 2021-08-06 DIAGNOSIS — L821 Other seborrheic keratosis: Secondary | ICD-10-CM | POA: Diagnosis not present

## 2021-08-06 DIAGNOSIS — L570 Actinic keratosis: Secondary | ICD-10-CM | POA: Diagnosis not present

## 2021-08-09 DIAGNOSIS — H353122 Nonexudative age-related macular degeneration, left eye, intermediate dry stage: Secondary | ICD-10-CM | POA: Diagnosis not present

## 2021-08-12 DIAGNOSIS — K219 Gastro-esophageal reflux disease without esophagitis: Secondary | ICD-10-CM | POA: Diagnosis not present

## 2021-08-12 DIAGNOSIS — E782 Mixed hyperlipidemia: Secondary | ICD-10-CM | POA: Diagnosis not present

## 2021-08-12 DIAGNOSIS — I10 Essential (primary) hypertension: Secondary | ICD-10-CM | POA: Diagnosis not present

## 2021-08-19 ENCOUNTER — Other Ambulatory Visit: Payer: Self-pay | Admitting: Cardiovascular Disease

## 2021-08-22 ENCOUNTER — Other Ambulatory Visit: Payer: Self-pay | Admitting: Cardiovascular Disease

## 2021-08-30 DIAGNOSIS — S0502XA Injury of conjunctiva and corneal abrasion without foreign body, left eye, initial encounter: Secondary | ICD-10-CM | POA: Diagnosis not present

## 2021-09-03 ENCOUNTER — Ambulatory Visit (INDEPENDENT_AMBULATORY_CARE_PROVIDER_SITE_OTHER): Payer: Medicare Other | Admitting: Ophthalmology

## 2021-09-03 ENCOUNTER — Encounter (INDEPENDENT_AMBULATORY_CARE_PROVIDER_SITE_OTHER): Payer: Medicare Other | Admitting: Ophthalmology

## 2021-09-03 ENCOUNTER — Encounter (INDEPENDENT_AMBULATORY_CARE_PROVIDER_SITE_OTHER): Payer: Self-pay | Admitting: Ophthalmology

## 2021-09-03 DIAGNOSIS — H2513 Age-related nuclear cataract, bilateral: Secondary | ICD-10-CM

## 2021-09-03 DIAGNOSIS — H353132 Nonexudative age-related macular degeneration, bilateral, intermediate dry stage: Secondary | ICD-10-CM

## 2021-09-03 DIAGNOSIS — H353211 Exudative age-related macular degeneration, right eye, with active choroidal neovascularization: Secondary | ICD-10-CM | POA: Diagnosis not present

## 2021-09-03 MED ORDER — BEVACIZUMAB 2.5 MG/0.1ML IZ SOSY
2.5000 mg | PREFILLED_SYRINGE | INTRAVITREAL | Status: AC | PRN
  Administered 2021-09-03: 2.5 mg via INTRAVITREAL

## 2021-09-03 NOTE — Assessment & Plan Note (Addendum)
OD, much improved macular findings and stable acuity since onset of therapy when intraretinal subretinal fluid existed in the foveal location, February 2022.  Today at 8-week interval examination stabilized ? ?Plan extend interval next to 9 weeks ?

## 2021-09-03 NOTE — Assessment & Plan Note (Signed)
Moderate OU 

## 2021-09-03 NOTE — Progress Notes (Signed)
? ? ?09/03/2021 ? ?  ? ?CHIEF COMPLAINT ?Patient presents for  ?Chief Complaint  ?Patient presents with  ? Macular Degeneration  ? ? ? ? ?HISTORY OF PRESENT ILLNESS: ?Brittany Figueroa is a 81 y.o. female who presents to the clinic today for:  ? ?HPI   ?8 weeks dilate OU, Avastin OD, OCT. ?Patient states about a week ago she thinks her contact lens scratched her eye, she went to see her primary doctor, Dr. Felipa Eth on Friday and he reported he did not see any contact stuck in her eye and her eye is not infected. Pt is not using any prescription eye drops. ?Pt states her vision is stable. ?Last edited by Laurin Coder on 09/03/2021  1:19 PM.  ?  ? ? ?Referring physician: ?Stephannie Li, [place] ?[address],  [place] [id number] ? ?HISTORICAL INFORMATION:  ? ?Selected notes from the [facility] ?  ? ?Lab Results  ?Component Value Date  ? HGBA1C 6.3 06/03/2021  ?  ? ?CURRENT MEDICATIONS: ?No current outpatient medications on file. (Ophthalmic Drugs)  ? ?No current facility-administered medications for this visit. (Ophthalmic Drugs)  ? ?Current Outpatient Medications (Other)  ?Medication Sig  ? acetaminophen (TYLENOL) 500 MG tablet Take 500 mg by mouth every 6 (six) hours as needed for mild pain or headache.  ? aspirin 81 MG tablet Take 81 mg by mouth daily.  ? calcium-vitamin D (OSCAL WITH D) 500-200 MG-UNIT per tablet Take 1 tablet by mouth daily as needed.  ? carvedilol (COREG) 12.5 MG tablet Take 12.5 mg by mouth 2 (two) times daily with a meal.  ? Cholecalciferol (VITAMIN D3) 2000 UNITS TABS Take 2,000 Units by mouth 2 (two) times daily.  ? clopidogrel (PLAVIX) 75 MG tablet TAKE 1 TABLET(75 MG) BY MOUTH DAILY  ? diclofenac sodium (VOLTAREN) 1 % GEL Apply 2 g topically daily as needed. For pain  ? DILT-XR 240 MG 24 hr capsule Take 240 mg by mouth daily.  ? ezetimibe (ZETIA) 10 MG tablet TAKE 1 TABLET(10 MG) BY MOUTH DAILY  ? fenofibrate 160 MG tablet TAKE 1 TABLET(160 MG) BY MOUTH DAILY  ? glucose  blood (ONETOUCH VERIO) test strip Use to test blood sugar once daily ?Dx code E11.9  ? hydrocortisone valerate cream (WESTCORT) 0.2 % Apply 1 application topically daily as needed (ITCHING).  ? irbesartan (AVAPRO) 300 MG tablet TAKE 1/2 TABLET BY MOUTH TWICE DAILY IN THE MORNING AND AT BEDTIME  ? irbesartan (AVAPRO) 300 MG tablet TAKE 1/2 TABLET BY MOUTH TWICE DAILY( IN THE MORNING AND AT BEDTIME)  ? levothyroxine (SYNTHROID) 25 MCG tablet TAKE 1 AND 1/2 TABLETS BY MOUTH DAILY BEFORE BREAKFAST  ? metFORMIN (GLUCOPHAGE-XR) 500 MG 24 hr tablet TAKE 2 TABLETS BY MOUTH DAILY WITH SUPPER  ? Multiple Vitamin (MULTIVITAMIN WITH MINERALS) TABS Take 1 tablet by mouth daily.  ? Multiple Vitamins-Minerals (PRESERVISION AREDS 2+MULTI VIT PO) Take 1 tablet by mouth in the morning and at bedtime.  ? nitroGLYCERIN (NITROSTAT) 0.4 MG SL tablet PLACE 1 TABLET UNDER THE TONGUE EVERY 5 MINUTES AS NEEDED FOR CHEST PAIN  ? omega-3 acid ethyl esters (LOVAZA) 1 g capsule Take 2 capsules (2 g total) by mouth 2 (two) times daily.  ? REPATHA SURECLICK 846 MG/ML SOAJ INJECT 1 PEN UNDER THE SKIN EVERY 14 DAYS  ? triamterene-hydrochlorothiazide (MAXZIDE-25) 37.5-25 MG tablet Take 1 tablet by mouth daily. Patient taking  1/4 tablet  ? ?No current facility-administered medications for this visit. (Other)  ? ? ? ? ?  REVIEW OF SYSTEMS: ? ? ? ?ALLERGIES ?Allergies  ?Allergen Reactions  ? Statins Other (See Comments)  ?  "intermittent loss of circulation; hands and arms will go to sleep; feet will get cramps; fatigue" (05/13/2012).  This occurred with Lipitor, Zocor, Crestor 5 mg qd, and she thinks pravastatin as well  ? Amlodipine Itching  ? Gadolinium Derivatives Itching and Nausea Only  ?  Pt had severe nausea and itching on legs, neck and back, per dr [name] pt needs 13 hour prep before contrast in the future  ? ? ?PAST MEDICAL HISTORY ?Past Medical History:  ?Diagnosis Date  ? Abnormal finding on EKG, new anterolateral T-wave inversions   05/14/2012  ? Abnormal nuclear stress test, with infero-lateral ischemia 05/14/2012  ? Arthritis   ? "right thumb; all my fingers" (05/13/2012), cervical spondylosis   ? Atypical angina ([place]) 05/14/2012  ? CAD (coronary artery disease), 05/13/12, with 80% LAD 05/14/2012  ? Cancer [facility])   ? basal cell on facial in L temporal region    ? [facility] arthritis, thumb, degenerative   ? Complication of anesthesia 12/2009  ? "had endoscopy; larynx went into spasms; stopped breathing for 15 seconds" (05/13/2012)  ? GERD (gastroesophageal reflux disease) 2011  ? Hypercholesteremia   ? Hypertension   ? S/P angioplasty with stent, LAD 05/13/12 05/14/2012  ? Tuberculosis   ? test +- [place] [facility] - Dr. [name]- CXR- OK  ? ?Past Surgical History:  ?Procedure Laterality Date  ? ANTERIOR CERVICAL DECOMP/DISCECTOMY FUSION N/A 12/13/2013  ? Procedure: ANTERIOR CERVICAL DECOMPRESSION/DISCECTOMY FUSION 3 LEVELS Cervical four/five,five/six,six/seven. Anterior cervical disectomy and fusion with peek and plate ;  Surgeon: [name], MD;  Location: [facility] NEURO ORS;  Service: Neurosurgery;  Laterality: N/A;  ? CARDIAC CATHETERIZATION N/A 07/02/2015  ? Procedure: Left Heart Cath and Coronary Angiography;  Surgeon: [name], MD;  Location: [facility] CV LAB;  Service: Cardiovascular;  Laterality: N/A;  ? CORONARY ANGIOPLASTY WITH STENT PLACEMENT  05/13/2012  ? DES to LAD; she has total RCA with left to rt coll. normal LV function done for positive nuc study  ? DILATION AND CURETTAGE OF UTERUS  1960's; 1970's; 1980  ? "probably 3" (05/13/2012)  ? FOREARM / WRIST TENDON LESION EXCISION  ?11/2001  ? "right; did waver to try to get ulnar out of hole that it had cut" (05/13/2012  ? INGUINAL HERNIA REPAIR  ~ 1966; 07/21/2002  ? "right; left" (05/13/2012)  ? LEFT HEART CATHETERIZATION WITH CORONARY ANGIOGRAM N/A 05/13/2012  ? Procedure: LEFT HEART CATHETERIZATION WITH CORONARY ANGIOGRAM;  Surgeon: [name], MD;  Location: [facility] CATH LAB;  Service:  Cardiovascular;  Laterality: N/A;  ? OSTEOTOMY AND ULNAR SHORTENING  07/21/2002  ? "right" (05/13/2012)  ? TONSILLECTOMY AND ADENOIDECTOMY  1950's  ? TUBAL LIGATION  1980  ? ? ?FAMILY HISTORY ?Family History  ?Problem Relation Age of Onset  ? Hypertension Mother   ? Diabetes Mother   ? Hyperlipidemia Father   ? Heart disease Father   ? Stroke Father   ? Heart disease Sister   ? Diabetes Sister   ? Heart disease Brother   ? Diabetes Maternal Aunt   ? ? ?SOCIAL HISTORY ?Social History  ? ?Tobacco Use  ? Smoking status: Former  ?  Packs/day: 0.50  ?  Years: 20.00  ?  Pack years: 10.00  ?  Types: Cigarettes  ?  Quit date: 07/05/1988  ?  Years since quitting: 33.1  ? Smokeless tobacco: Never  ?Substance  Use Topics  ? Alcohol use: Yes  ?  Alcohol/week: 12.0 standard drinks  ?  Types: 12 Glasses of wine per week  ?  Comment: 05/13/2012 "6-8 oz glass of red wine q hs"   ? Drug use: No  ? ?  ? ?  ? ?OPHTHALMIC EXAM: ? ?Base Eye Exam   ? ? Visual Acuity (ETDRS)   ? ?   Right Left  ? Dist cc 20/25 -1 20/20  ? ? Correction: Glasses  ? ?  ?  ? ? Tonometry (Tonopen, 1:21 PM)   ? ?   Right Left  ? Pressure 10 12  ? ?  ?  ? ? Pupils   ? ?   Pupils Dark Light APD  ? Right PERRL 4 3 None  ? Left PERRL 4 3 None  ? ?  ?  ? ? Visual Fields   ? ?   Left Right  ?  Full Full  ? ?  ?  ? ? Extraocular Movement   ? ?   Right Left  ?  Full Full  ? ?  ?  ? ? Neuro/Psych   ? ? Oriented x3: Yes  ? Mood/Affect: Normal  ? ?  ?  ? ? Dilation   ? ? Both eyes: 1.0% Mydriacyl, 2.5% Phenylephrine @ 1:21 PM  ? ?  ?  ? ?  ? ?Slit Lamp and Fundus Exam   ? ? External Exam   ? ?   Right Left  ? External Normal Normal  ? ?  ?  ? ? Slit Lamp Exam   ? ?   Right Left  ? Lids/Lashes Normal Normal  ? Conjunctiva/Sclera White and quiet White and quiet  ? Cornea Clear Clear  ? Anterior Chamber Deep and quiet Deep and quiet  ? Iris Round and reactive Round and reactive  ? Lens 2+ Nuclear sclerosis 2+ Nuclear sclerosis  ? Anterior Vitreous Normal, no cells Normal  ? ?  ?   ? ? Fundus Exam   ? ?   Right Left  ? Posterior Vitreous Normal,  no cells Normal  ? Disc Normal Normal  ? C/D Ratio 0.4 0.45  ? Macula Hard drusen, Microaneurysms, no macular thickening, no hemorrhage Hard druse

## 2021-09-03 NOTE — Assessment & Plan Note (Signed)
No sign of CNVM OS 

## 2021-09-08 DIAGNOSIS — H353122 Nonexudative age-related macular degeneration, left eye, intermediate dry stage: Secondary | ICD-10-CM | POA: Diagnosis not present

## 2021-09-09 ENCOUNTER — Telehealth: Payer: Self-pay | Admitting: Cardiovascular Disease

## 2021-09-09 ENCOUNTER — Telehealth: Payer: Self-pay

## 2021-09-09 NOTE — Telephone Encounter (Signed)
Patient has 1 month left of repatha injections and needs a new assistance form completed. She wants to be notified when the form is completed. ?

## 2021-09-09 NOTE — Telephone Encounter (Signed)
LMOM the pt to contact [employer] durectly as it wouldn't allow me to renew her in the portal. I stated to call [phone] and then if she has trouble to call us ?

## 2021-09-09 NOTE — Telephone Encounter (Signed)
Pt c/o medication issue: ? ?1. Name of Medication:  ?REPATHA SURECLICK 289 MG/ML SOAJ ? ?2. How are you currently taking this medication (dosage and times per day)?  ?As prescribed ? ?3. Are you having a reaction (difficulty breathing--STAT)?  ? ?4. What is your medication issue?  ? ?Patient states she is unable to afford Repatha and she would like help with starting a patient assistance application.  ?

## 2021-10-07 ENCOUNTER — Other Ambulatory Visit (INDEPENDENT_AMBULATORY_CARE_PROVIDER_SITE_OTHER): Payer: Medicare Other

## 2021-10-07 DIAGNOSIS — R7303 Prediabetes: Secondary | ICD-10-CM | POA: Diagnosis not present

## 2021-10-07 LAB — BASIC METABOLIC PANEL
BUN: 21 mg/dL (ref 6–23)
CO2: 28 mEq/L (ref 19–32)
Calcium: 11.4 mg/dL — ABNORMAL HIGH (ref 8.4–10.5)
Chloride: 98 mEq/L (ref 96–112)
Creatinine, Ser: 0.77 mg/dL (ref 0.40–1.20)
GFR: 73.75 mL/min (ref >60.00)
Glucose, Bld: 106 mg/dL — ABNORMAL HIGH (ref 70–99)
Potassium: 4.3 mEq/L (ref 3.5–5.1)
Sodium: 133 mEq/L — ABNORMAL LOW (ref 135–145)

## 2021-10-07 LAB — LIPID PANEL
Cholesterol: 155 mg/dL (ref 0–200)
HDL: 48.4 mg/dL (ref >39.00)
LDL Cholesterol: 67 mg/dL (ref 0–99)
NonHDL: 106.65
Total CHOL/HDL Ratio: 3
Triglycerides: 200 mg/dL — ABNORMAL HIGH (ref 0.0–149.0)
VLDL: 40 mg/dL (ref 0.0–40.0)

## 2021-10-07 LAB — HEMOGLOBIN A1C: Hgb A1c MFr Bld: 6.1 % (ref 4.6–6.5)

## 2021-10-08 DIAGNOSIS — H353122 Nonexudative age-related macular degeneration, left eye, intermediate dry stage: Secondary | ICD-10-CM | POA: Diagnosis not present

## 2021-10-10 ENCOUNTER — Encounter: Payer: Self-pay | Admitting: Endocrinology

## 2021-10-10 ENCOUNTER — Ambulatory Visit: Payer: Medicare Other | Admitting: Endocrinology

## 2021-10-10 VITALS — BP 110/60 | HR 77 | Ht 61.0 in | Wt 133.0 lb

## 2021-10-10 DIAGNOSIS — R7303 Prediabetes: Secondary | ICD-10-CM

## 2021-10-10 DIAGNOSIS — M8580 Other specified disorders of bone density and structure, unspecified site: Secondary | ICD-10-CM | POA: Diagnosis not present

## 2021-10-10 DIAGNOSIS — E559 Vitamin D deficiency, unspecified: Secondary | ICD-10-CM | POA: Diagnosis not present

## 2021-10-10 DIAGNOSIS — Z780 Asymptomatic menopausal state: Secondary | ICD-10-CM

## 2021-10-10 DIAGNOSIS — E039 Hypothyroidism, unspecified: Secondary | ICD-10-CM | POA: Diagnosis not present

## 2021-10-10 LAB — CALCIUM: Calcium: 10.1 mg/dL (ref 8.4–10.5)

## 2021-10-10 NOTE — Patient Instructions (Signed)
Check blood sugars on waking up 2 days a week  Also check blood sugars about 2 hours after meals and do this after different meals by rotation  Recommended blood sugar levels on waking up are 90-120 and about 2 hours after meal is 130-160  Please bring your blood sugar monitor to each visit, thank you

## 2021-10-10 NOTE — Progress Notes (Signed)
Patient ID: Brittany Figueroa, female   DOB: [birth year], 81 y.o.   MRN: [id number]           Chief complaint: Endocrinology follow-up    History of Present Illness:  PREVIOUS history: She may have been told about 10 years ago that she had prediabetes and reportedly had a normal glucose tolerance test. She was told by her PCP that she has type 2 diabetes based on an A1c of 6.5% in 07/2013 She has not had any other A1c is subsequently above normal, normal range and previous lab 6.2 or less She also has not had any abnormal blood sugars except a glucose of 125 in 3/16  Has history of increased fasting blood sugars ranging from 108-125 since about 2012  GLUCOSE TOLERANCE test on 05/23/15: Fasting glucose 110, two-hour glucose 192  RECENT history:   She has impaired fasting glucose and glucose intolerance  She has been intermittently on metformin ER since about 08/2015 In 1/18 when A1c was 6.5 and glucose 133 fasting she was told to start back on metformin and also improve her diet She was taking 1500 mg a day but could not tolerate this because of feeling of bloating  Current regimen: Metformin ER 500 mg twice daily   A1c is 6.1  Current blood sugar readings, management and history    She is still continuing metformin without side effects  Again she has some limitations for walking because of foot pain  Again generally trying to watch her portions and carbohydrates Weight is about the same  As below her blood sugars are excellent and near normal at home However she has not checked readings after meals lately   Using freestyle neo monitor Recently has only 4 or 5 readings mostly in the morning with only 1 reading over 100 at 105 Lab glucose 106 fasting Overall AVERAGE 96  Previously:  PRE-MEAL Fasting Lunch Dinner Bedtime Overall  Glucose range: 91-113 106 92    Mean/median: 104    110   POST-MEAL PC Breakfast PC Lunch PC Dinner  Glucose range:  166   Mean/median:        Weight history: Previous range 139-143  Wt Readings from Last 3 Encounters:  10/10/21 133 lb (60.3 kg)  06/06/21 135 lb (61.2 kg)  02/28/21 135 lb (61.2 kg)    Lab Results  Component Value Date   HGBA1C 6.1 10/07/2021   HGBA1C 6.3 06/03/2021   HGBA1C 6.4 02/25/2021   Lab Results  Component Value Date   MICROALBUR <0.7 07/17/2020   LDLCALC 67 10/07/2021   CREATININE 0.77 10/07/2021   HYPOTHYROIDISM and other problems reviewed today: See review of systems    Lab on 10/07/2021  Component Date Value Ref Range Status   Sodium 10/07/2021 133 (L)  135 - 145 mEq/L Final   Potassium 10/07/2021 4.3  3.5 - 5.1 mEq/L Final   Chloride 10/07/2021 98  96 - 112 mEq/L Final   CO2 10/07/2021 28  19 - 32 mEq/L Final   Glucose, Bld 10/07/2021 106 (H)  70 - 99 mg/dL Final   BUN 10/07/2021 21  6 - 23 mg/dL Final   Creatinine, Ser 10/07/2021 0.77  0.40 - 1.20 mg/dL Final   GFR 10/07/2021 73.75  >60.00 mL/min Final   Calculated using the CKD-EPI Creatinine Equation (2021)   Calcium 10/07/2021 11.4 (H)  8.4 - 10.5 mg/dL Final   Cholesterol 10/07/2021 155  0 - 200 mg/dL Final   ATP III Classification  Desirable:  < 200 mg/dL               Borderline High:  200 - 239 mg/dL          High:  > = 240 mg/dL   Triglycerides 10/07/2021 200.0 (H)  0.0 - 149.0 mg/dL Final   Normal:  <150 mg/dLBorderline High:  150 - 199 mg/dL   HDL 10/07/2021 48.40  >39.00 mg/dL Final   VLDL 10/07/2021 40.0  0.0 - 40.0 mg/dL Final   LDL Cholesterol 10/07/2021 67  0 - 99 mg/dL Final   Total CHOL/HDL Ratio 10/07/2021 3   Final                  Men          Women1/2 Average Risk     3.4          3.3Average Risk          5.0          4.42X Average Risk          9.6          7.13X Average Risk          15.0          11.0                       NonHDL 10/07/2021 106.65   Final   NOTE:  Non-HDL goal should be 30 mg/dL higher than patient's LDL goal (i.e. LDL goal of < 70 mg/dL, would have non-HDL goal of < 100 mg/dL)    Hgb A1c MFr Bld 10/07/2021 6.1  4.6 - 6.5 % Final   Glycemic Control Guidelines for People with Diabetes:Non Diabetic:  <6%Goal of Therapy: <7%Additional Action Suggested:  >8%       Past Medical History:  Diagnosis Date   Abnormal finding on EKG, new anterolateral T-wave inversions  05/14/2012   Abnormal nuclear stress test, with infero-lateral ischemia 05/14/2012   Arthritis    "right thumb; all my fingers" (05/13/2012), cervical spondylosis    Atypical angina ([place]) 05/14/2012   CAD (coronary artery disease), 05/13/12, with 80% LAD 05/14/2012   Cancer ([place])    basal cell on facial in L temporal region     [facility] arthritis, thumb, degenerative    Complication of anesthesia 12/2009   "had endoscopy; larynx went into spasms; stopped breathing for 15 seconds" (05/13/2012)   GERD (gastroesophageal reflux disease) 2011   Hypercholesteremia    Hypertension    S/P angioplasty with stent, LAD 05/13/12 05/14/2012   Tuberculosis    test +- [place] [facility] - Dr. [name]- CXR- OK    Past Surgical History:  Procedure Laterality Date   ANTERIOR CERVICAL DECOMP/DISCECTOMY FUSION N/A 12/13/2013   Procedure: ANTERIOR CERVICAL DECOMPRESSION/DISCECTOMY FUSION 3 LEVELS Cervical four/five,five/six,six/seven. Anterior cervical disectomy and fusion with peek and plate ;  Surgeon: [name], MD;  Location: [place] [facility];  Service: Neurosurgery;  Laterality: N/A;   CARDIAC CATHETERIZATION N/A 07/02/2015   Procedure: Left Heart Cath and Coronary Angiography;  Surgeon: [name], MD;  Location: [facility];  Service: Cardiovascular;  Laterality: N/A;   CORONARY ANGIOPLASTY WITH STENT PLACEMENT  05/13/2012   DES to LAD; she has total RCA with left to rt coll. normal LV function done for positive nuc study   DILATION AND CURETTAGE OF UTERUS  1960's; 1970's; 1980   "probably 3" (05/13/2012)   FOREARM / WRIST TENDON  LESION EXCISION  ?11/2001   "right; did waver to try to get ulnar out of hole that it had  cut" (05/13/2012   INGUINAL HERNIA REPAIR  ~ 1966; 07/21/2002   "right; left" (05/13/2012)   LEFT HEART CATHETERIZATION WITH CORONARY ANGIOGRAM N/A 05/13/2012   Procedure: LEFT HEART CATHETERIZATION WITH CORONARY ANGIOGRAM;  Surgeon: [name], MD;  Location: [facility] C[facility] LAB;  Service: Cardiovascular;  Laterality: N/A;   OSTEOTOMY AND ULNAR SHORTENING  07/21/2002   "right" (05/13/2012)   TONSILLECTOMY AND ADENOIDECTOMY  1950's   TUBAL LIGATION  1980    Family History  Problem Relation Age of Onset   Hypertension Mother    Diabetes Mother    Hyperlipidemia Father    Heart disease Father    Stroke Father    Heart disease Sister    Diabetes Sister    Heart disease Brother    Diabetes Maternal Aunt     Social History:  reports that she quit smoking about 33 years ago. Her smoking use included cigarettes. She has a 10.00 pack-year smoking history. She has never used smokeless tobacco. She reports current alcohol use of about 12.0 standard drinks per week. She reports that she does not use drugs.  Allergies:  Allergies  Allergen Reactions   Statins Other (See Comments)    "intermittent loss of circulation; hands and arms will go to sleep; feet will get cramps; fatigue" (05/13/2012).  This occurred with Lipitor, Zocor, Crestor 5 mg qd, and she thinks pravastatin as well   Amlodipine Itching   Gadolinium Derivatives Itching and Nausea Only    Pt had severe nausea and itching on legs, neck and back, per dr [name] pt needs 13 hour prep before contrast in the future    Allergies as of 10/10/2021       Reactions   Statins Other (See Comments)   "intermittent loss of circulation; hands and arms will go to sleep; feet will get cramps; fatigue" (05/13/2012).  This occurred with Lipitor, Zocor, Crestor 5 mg qd, and she thinks pravastatin as well   Amlodipine Itching   Gadolinium Derivatives Itching, Nausea Only   Pt had severe nausea and itching on legs, neck and back, per dr [name] pt  needs 13 hour prep before contrast in the future        Medication List        Accurate as of Oct 10, 2021 10:51 AM. If you have any questions, ask your nurse or doctor.          acetaminophen 500 MG tablet Commonly known as: TYLENOL Take 500 mg by mouth every 6 (six) hours as needed for mild pain or headache.   ASPIRIN 81 PO 1 tablet   aspirin 81 MG tablet Take 81 mg by mouth daily.   calcium-vitamin D 500-200 MG-UNIT tablet Commonly known as: OSCAL WITH D Take 1 tablet by mouth daily as needed.   carvedilol 12.5 MG tablet Commonly known as: COREG Take 12.5 mg by mouth 2 (two) times daily with a meal.   clopidogrel 75 MG tablet Commonly known as: PLAVIX TAKE 1 TABLET(75 MG) BY MOUTH DAILY   diclofenac sodium 1 % Gel Commonly known as: VOLTAREN Apply 2 g topically daily as needed. For pain   diltiazem 240 MG 24 hr capsule Commonly known as: DILACOR XR TAKE 1 CAPSULE   Dilt-XR 240 MG 24 hr capsule Generic drug: diltiazem Take 240 mg by mouth daily.   ezetimibe 10 MG tablet Commonly known as:  ZETIA TAKE 1 TABLET(10 MG) BY MOUTH DAILY   fenofibrate 160 MG tablet TAKE 1 TABLET(160 MG) BY MOUTH DAILY   glucose blood test strip Commonly known as: OneTouch Verio Use to test blood sugar once daily Dx code E11.9   hydrocortisone valerate cream 0.2 % Commonly known as: WESTCORT Apply 1 application topically daily as needed (ITCHING).   irbesartan 300 MG tablet Commonly known as: AVAPRO TAKE 1/2 TABLET BY MOUTH TWICE DAILY IN THE MORNING AND AT BEDTIME   irbesartan 300 MG tablet Commonly known as: AVAPRO TAKE 1/2 TABLET BY MOUTH TWICE DAILY( IN THE MORNING AND AT BEDTIME)   levothyroxine 25 MCG tablet Commonly known as: SYNTHROID TAKE 1 AND 1/2 TABLETS BY MOUTH DAILY BEFORE BREAKFAST   metFORMIN 500 MG 24 hr tablet Commonly known as: GLUCOPHAGE-XR TAKE 2 TABLETS BY MOUTH DAILY WITH SUPPER   multivitamin with minerals Tabs tablet Take 1 tablet by  mouth daily.   nitroGLYCERIN 0.4 MG SL tablet Commonly known as: NITROSTAT PLACE 1 TABLET UNDER THE TONGUE EVERY 5 MINUTES AS NEEDED FOR CHEST PAIN   omega-3 acid ethyl esters 1 g capsule Commonly known as: LOVAZA Take 2 capsules (2 g total) by mouth 2 (two) times daily.   PRESERVISION AREDS 2+MULTI VIT PO Take 1 tablet by mouth in the morning and at bedtime.   Repatha SureClick 462 MG/ML [place] Generic drug: Evolocumab INJECT 1 PEN UNDER THE SKIN EVERY 14 DAYS   triamterene-hydrochlorothiazide 37.5-25 MG tablet Commonly known as: MAXZIDE-25 Take 1 tablet by mouth daily. Patient taking  1/4 tablet   Vitamin D3 50 MCG (2000 UT) Tabs Take 2,000 Units by mouth 2 (two) times daily.        LABS:  Lab on 10/07/2021  Component Date Value Ref Range Status   Sodium 10/07/2021 133 (L)  135 - 145 mEq/L Final   Potassium 10/07/2021 4.3  3.5 - 5.1 mEq/L Final   Chloride 10/07/2021 98  96 - 112 mEq/L Final   CO2 10/07/2021 28  19 - 32 mEq/L Final   Glucose, Bld 10/07/2021 106 (H)  70 - 99 mg/dL Final   BUN 10/07/2021 21  6 - 23 mg/dL Final   Creatinine, Ser 10/07/2021 0.77  0.40 - 1.20 mg/dL Final   GFR 10/07/2021 73.75  >60.00 mL/min Final   Calculated using the CKD-EPI Creatinine Equation (2021)   Calcium 10/07/2021 11.4 (H)  8.4 - 10.5 mg/dL Final   Cholesterol 10/07/2021 155  0 - 200 mg/dL Final   ATP III Classification       Desirable:  < 200 mg/dL               Borderline High:  200 - 239 mg/dL          High:  > = 240 mg/dL   Triglycerides 10/07/2021 200.0 (H)  0.0 - 149.0 mg/dL Final   Normal:  <150 mg/dLBorderline High:  150 - 199 mg/dL   HDL 10/07/2021 48.40  >39.00 mg/dL Final   VLDL 10/07/2021 40.0  0.0 - 40.0 mg/dL Final   LDL Cholesterol 10/07/2021 67  0 - 99 mg/dL Final   Total CHOL/HDL Ratio 10/07/2021 3   Final                  Men          Women1/2 Average Risk     3.4          3.3Average Risk  5.0          4.42X Average Risk          9.6          7.13X  Average Risk          15.0          11.0                       NonHDL 10/07/2021 106.65   Final   NOTE:  Non-HDL goal should be 30 mg/dL higher than patient's LDL goal (i.e. LDL goal of < 70 mg/dL, would have non-HDL goal of < 100 mg/dL)   Hgb A1c MFr Bld 10/07/2021 6.1  4.6 - 6.5 % Final   Glycemic Control Guidelines for People with Diabetes:Non Diabetic:  <6%Goal of Therapy: <7%Additional Action Suggested:  >8%       Review of Systems    OSTEOPENIA:   Lowest T score is at the right neck femur previously -2.4  This is now only 0.8 as of 11/14/2019  However BMD at the left femoral neck is last -2.0 compared to -1.8  Apparently had tried Fosamax in the past and this caused some GI side effects She received RECLAST infusion on 12/15/17 and again on 01/07/2020 She will have her next infusion in 12/2021  Vitamin D deficiency: She is taking OTC vitamin D 4000 U daily  Last vitamin D level is 39.3, last 61  HYPERTENSION: Treated with Avapro, diltiazem, maxzide quarter tablet and Coreg, Followed by PCP.  Home BP has been checked also  HYPERLIPIDEMIA: Followed by cardiology History of hyperlipidemia with some intolerance to statins, on Repatha and also on Zetia.   Has high triglycerides, with the following triglycerides   Last fasting lipids as follows  Lab Results  Component Value Date   CHOL 155 10/07/2021   CHOL 162 10/29/2020   CHOL 169 08/22/2020   Lab Results  Component Value Date   HDL 48.40 10/07/2021   HDL 47 10/29/2020   HDL 47 08/22/2020   Lab Results  Component Value Date   LDLCALC 67 10/07/2021   LDLCALC 76 10/29/2020   LDLCALC 83 08/22/2020   Lab Results  Component Value Date   TRIG 200.0 (H) 10/07/2021   TRIG 239 (H) 10/29/2020   TRIG 236 (H) 08/22/2020   Lab Results  Component Value Date   CHOLHDL 3 10/07/2021   CHOLHDL 3.4 10/29/2020   CHOLHDL 3.6 08/22/2020   Lab Results  Component Value Date   LDLDIRECT 130.1 04/04/2014        HYPOTHYROIDISM: TSH level previously has been as high as 5.6  This spontaneously improved but in 05/2018 was mildly increased again  At that time she was complaining of some fatigue, constipation, dry skin, some cold intolerance and difficulty losing weight.  She is taking levothyroxine 37.5 mcg daily, she did have some improvement in her fatigue with the higher dose   Her TSH is usually quite normal  Lab Results  Component Value Date   TSH 3.05 06/03/2021   TSH 2.68 02/25/2021   TSH 3.15 10/23/2020   FREET4 1.29 06/03/2021   FREET4 1.16 02/25/2021   FREET4 1.21 10/23/2020   CALCIUM levels: Usually has quite normal calcium levels usually below 10 and now is unusually high at 11.4 Has not had any excessive vitamin D usage  Lab Results  Component Value Date   CALCIUM 10.1 10/10/2021     PHYSICAL EXAM:  BP 110/60  Pulse 77   Ht '5\' 1"'$  (1.549 m)   Wt 133 lb (60.3 kg)   SpO2 97%   BMI 25.13 kg/m      ASSESSMENT:   Prediabetes with impaired fasting glucose and also impaired glucose tolerance  Most of her A1c and blood sugars in the past have been in the prediabetic range  Transiently she has had high blood sugars over 125 and A1c up to 6.7 partly from steroids  Her A1c is excellent and improved at 6.1  She is on 500 mg of Metformin twice a day  She has continue to follow her diet Lab glucose 106 fasting   HYPOTHYROIDISM: To recheck TSH on the next visit   Hypertriglyceridemia: Still mildly increased despite multiple drugs and fairly good diet  Mild hyponatremia: Again stable and asymptomatic, now 133 and likely to be from HCTZ  OSTEOPENIA: Has had 2 infusions of Reclast  PLAN:   No change in metformin Low blood sugars after meals  She will continue 1-1/2 tablets of 25 mcg levothyroxine and have labs done on the next visit  She will have vitamin D level checked again  Recheck calcium level as it may be artifact of the weight was drawn with tight  tourniquet  Recheck CALCIUM with no tourniquet use if possible  Will consider bone density for follow-up later this year, to be scheduled for Reclast in August  Follow-up in  4 months     Parys Elenbaas 10/10/2021, 10:51 AM

## 2021-10-11 ENCOUNTER — Encounter: Payer: Self-pay | Admitting: Endocrinology

## 2021-10-11 DIAGNOSIS — M8580 Other specified disorders of bone density and structure, unspecified site: Secondary | ICD-10-CM | POA: Insufficient documentation

## 2021-10-15 ENCOUNTER — Telehealth: Payer: Self-pay | Admitting: Pharmacy Technician

## 2021-10-15 NOTE — Telephone Encounter (Signed)
Dr. Dwyane [name],  Josem Kaufmann Submission: no Josem Kaufmann needed Payer: [employer] Medication & CPT/J Code(s) submitted: Reclast (Zolendronic acid) Q9447 Route of submission (phone, fax, portal): phone Auth type: Buy/Bill Units/visits requested: x1 dose Reference number: [id number] Approval from: 10/15/21 to 10/16/22   Patient will be scheduled as soon as possible

## 2021-10-16 ENCOUNTER — Other Ambulatory Visit: Payer: Self-pay | Admitting: Endocrinology

## 2021-10-16 ENCOUNTER — Other Ambulatory Visit: Payer: Self-pay | Admitting: Cardiovascular Disease

## 2021-10-16 DIAGNOSIS — I700 Atherosclerosis of aorta: Secondary | ICD-10-CM | POA: Diagnosis not present

## 2021-10-16 DIAGNOSIS — R7303 Prediabetes: Secondary | ICD-10-CM | POA: Diagnosis not present

## 2021-10-16 DIAGNOSIS — I10 Essential (primary) hypertension: Secondary | ICD-10-CM | POA: Diagnosis not present

## 2021-10-16 DIAGNOSIS — K219 Gastro-esophageal reflux disease without esophagitis: Secondary | ICD-10-CM | POA: Diagnosis not present

## 2021-10-16 DIAGNOSIS — E782 Mixed hyperlipidemia: Secondary | ICD-10-CM | POA: Diagnosis not present

## 2021-10-27 ENCOUNTER — Other Ambulatory Visit: Payer: Self-pay | Admitting: Cardiovascular Disease

## 2021-10-31 ENCOUNTER — Ambulatory Visit (INDEPENDENT_AMBULATORY_CARE_PROVIDER_SITE_OTHER): Payer: Medicare Other

## 2021-10-31 VITALS — BP 128/61 | HR 68 | Temp 97.7°F | Resp 18 | Ht 61.0 in | Wt 132.0 lb

## 2021-10-31 DIAGNOSIS — M8580 Other specified disorders of bone density and structure, unspecified site: Secondary | ICD-10-CM

## 2021-10-31 DIAGNOSIS — Z780 Asymptomatic menopausal state: Secondary | ICD-10-CM | POA: Diagnosis not present

## 2021-10-31 MED ORDER — ZOLEDRONIC ACID 5 MG/100ML IV SOLN
5.0000 mg | Freq: Once | INTRAVENOUS | Status: AC
  Administered 2021-10-31: 5 mg via INTRAVENOUS
  Filled 2021-10-31: qty 100

## 2021-10-31 MED ORDER — ACETAMINOPHEN 325 MG PO TABS
650.0000 mg | ORAL_TABLET | Freq: Once | ORAL | Status: AC
  Administered 2021-10-31: 650 mg via ORAL
  Filled 2021-10-31: qty 2

## 2021-10-31 MED ORDER — SODIUM CHLORIDE 0.9 % IV SOLN: INTRAVENOUS | Status: DC

## 2021-10-31 NOTE — Progress Notes (Signed)
Diagnosis: Osteoporosis  Provider:  Marshell Garfinkel, MD  Procedure: Infusion  IV Type: Peripheral, IV Location: R Antecubital  Reclast (Zolendronic Acid), Dose: 5 mg  Infusion Start Time: 5366  Infusion Stop Time: 4403  Post Infusion IV Care: Peripheral IV Discontinued  Discharge: Condition: Good, Destination: Home . AVS provided to patient.   Performed by:  Adelina Mings, LPN

## 2021-11-05 ENCOUNTER — Encounter (INDEPENDENT_AMBULATORY_CARE_PROVIDER_SITE_OTHER): Payer: Medicare Other | Admitting: Ophthalmology

## 2021-11-06 ENCOUNTER — Encounter (INDEPENDENT_AMBULATORY_CARE_PROVIDER_SITE_OTHER): Payer: Self-pay | Admitting: Ophthalmology

## 2021-11-06 ENCOUNTER — Ambulatory Visit (INDEPENDENT_AMBULATORY_CARE_PROVIDER_SITE_OTHER): Payer: Medicare Other | Admitting: Ophthalmology

## 2021-11-06 DIAGNOSIS — H353211 Exudative age-related macular degeneration, right eye, with active choroidal neovascularization: Secondary | ICD-10-CM

## 2021-11-06 DIAGNOSIS — H353132 Nonexudative age-related macular degeneration, bilateral, intermediate dry stage: Secondary | ICD-10-CM

## 2021-11-06 DIAGNOSIS — H2513 Age-related nuclear cataract, bilateral: Secondary | ICD-10-CM | POA: Diagnosis not present

## 2021-11-06 DIAGNOSIS — H43391 Other vitreous opacities, right eye: Secondary | ICD-10-CM | POA: Diagnosis not present

## 2021-11-06 MED ORDER — BEVACIZUMAB 2.5 MG/0.1ML IZ SOSY
2.5000 mg | PREFILLED_SYRINGE | INTRAVITREAL | Status: AC | PRN
  Administered 2021-11-06: 2.5 mg via INTRAVITREAL

## 2021-11-06 NOTE — Assessment & Plan Note (Signed)
Stable OD °

## 2021-11-06 NOTE — Assessment & Plan Note (Signed)
No sign of CNVM OS 

## 2021-11-06 NOTE — Progress Notes (Signed)
11/06/2021     CHIEF COMPLAINT Patient presents for  Chief Complaint  Patient presents with   Macular Degeneration      HISTORY OF PRESENT ILLNESS: Brittany Figueroa is a 81 y.o. female who presents to the clinic today for:   HPI   9 weeks dilate OD, Avastin OCT OD. Patient states vision is stable and unchanged since last visit. Denies any new floaters or FOL. Patient states she does a test for Macular Degeneration at home from Dr. Marzetta Board office, she used to see Dr. Kathlen Mody and she has seen Dr. Lucianne Lei in December. She is wondering if she should continue the test she takes if she is being seen by Dr. Zadie Rhine for injections. Last edited by Laurin Coder on 11/06/2021  2:26 PM.      Referring physician: Lajean Manes, MD [address] [place],  [place] [address]  HISTORICAL INFORMATION:   Selected notes from the MEDICAL RECORD NUMBER    Lab Results  Component Value Date   HGBA1C 6.1 10/07/2021     CURRENT MEDICATIONS: No current outpatient medications on file. (Ophthalmic Drugs)   No current facility-administered medications for this visit. (Ophthalmic Drugs)   Current Outpatient Medications (Other)  Medication Sig   acetaminophen (TYLENOL) 500 MG tablet Take 500 mg by mouth every 6 (six) hours as needed for mild pain or headache.   aspirin 81 MG tablet Take 81 mg by mouth daily.   ASPIRIN 81 PO 1 tablet   calcium-vitamin D (OSCAL WITH D) 500-200 MG-UNIT per tablet Take 1 tablet by mouth daily as needed.   carvedilol (COREG) 12.5 MG tablet Take 12.5 mg by mouth 2 (two) times daily with a meal.   Cholecalciferol (VITAMIN D3) 2000 UNITS TABS Take 2,000 Units by mouth 2 (two) times daily.   clopidogrel (PLAVIX) 75 MG tablet TAKE 1 TABLET(75 MG) BY MOUTH DAILY   diclofenac sodium (VOLTAREN) 1 % GEL Apply 2 g topically daily as needed. For pain   DILT-XR 240 MG 24 hr capsule Take 240 mg by mouth daily.   diltiazem (DILACOR XR) 240 MG 24 hr capsule TAKE 1 CAPSULE    ezetimibe (ZETIA) 10 MG tablet TAKE 1 TABLET(10 MG) BY MOUTH DAILY   fenofibrate 160 MG tablet TAKE 1 TABLET(160 MG) BY MOUTH DAILY   glucose blood (ONETOUCH VERIO) test strip Use to test blood sugar once daily Dx code E11.9   hydrocortisone valerate cream (WESTCORT) 0.2 % Apply 1 application topically daily as needed (ITCHING).   irbesartan (AVAPRO) 300 MG tablet TAKE 1/2 TABLET BY MOUTH TWICE DAILY IN THE MORNING AND AT BEDTIME   irbesartan (AVAPRO) 300 MG tablet TAKE 1/2 TABLET BY MOUTH TWICE DAILY( IN THE MORNING AND AT BEDTIME)   levothyroxine (SYNTHROID) 25 MCG tablet TAKE 1 AND 1/2 TABLETS BY MOUTH DAILY BEFORE BREAKFAST   metFORMIN (GLUCOPHAGE-XR) 500 MG 24 hr tablet TAKE 2 TABLETS BY MOUTH DAILY WITH SUPPER   Multiple Vitamin (MULTIVITAMIN WITH MINERALS) TABS Take 1 tablet by mouth daily.   Multiple Vitamins-Minerals (PRESERVISION AREDS 2+MULTI VIT PO) Take 1 tablet by mouth in the morning and at bedtime.   nitroGLYCERIN (NITROSTAT) 0.4 MG SL tablet PLACE 1 TABLET UNDER THE TONGUE EVERY 5 MINUTES AS NEEDED FOR CHEST PAIN   omega-3 acid ethyl esters (LOVAZA) 1 g capsule Take 2 capsules (2 g total) by mouth 2 (two) times daily.   REPATHA SURECLICK 546 MG/ML SOAJ INJECT 1 PEN UNDER THE SKIN EVERY 14  DAYS   triamterene-hydrochlorothiazide (MAXZIDE-25) 37.5-25 MG tablet Take 1 tablet by mouth daily. Patient taking  1/4 tablet   No current facility-administered medications for this visit. (Other)      REVIEW OF SYSTEMS: ROS   Negative for: Constitutional, Gastrointestinal, Neurological, Skin, Genitourinary, Musculoskeletal, HENT, Endocrine, Cardiovascular, Eyes, Respiratory, Psychiatric, Allergic/Imm, Heme/Lymph Last edited by Hurman Horn, MD on 11/06/2021  3:13 PM.       ALLERGIES Allergies  Allergen Reactions   Statins Other (See Comments)    "intermittent loss of circulation; hands and arms will go to sleep; feet will get cramps; fatigue" (05/13/2012).  This occurred with  Lipitor, Zocor, Crestor 5 mg qd, and she thinks pravastatin as well   Amlodipine Itching   Gadolinium Derivatives Itching and Nausea Only    Pt had severe nausea and itching on legs, neck and back, per dr [name] pt needs 13 hour prep before contrast in the future    PAST MEDICAL HISTORY Past Medical History:  Diagnosis Date   Abnormal finding on EKG, new anterolateral T-wave inversions  05/14/2012   Abnormal nuclear stress test, with infero-lateral ischemia 05/14/2012   Arthritis    "right thumb; all my fingers" (05/13/2012), cervical spondylosis    Atypical angina ([place]) 05/14/2012   CAD (coronary artery disease), 05/13/12, with 80% LAD 05/14/2012   Cancer ([place])    basal cell on facial in L temporal region     [facility] arthritis, thumb, degenerative    Complication of anesthesia 12/2009   "had endoscopy; larynx went into spasms; stopped breathing for 15 seconds" (05/13/2012)   GERD (gastroesophageal reflux disease) 2011   Hypercholesteremia    Hypertension    S/P angioplasty with stent, LAD 05/13/12 05/14/2012   Tuberculosis    test +- [facility] - Dr. [name]- CXR- OK   Past Surgical History:  Procedure Laterality Date   ANTERIOR CERVICAL DECOMP/DISCECTOMY FUSION N/A 12/13/2013   Procedure: ANTERIOR CERVICAL DECOMPRESSION/DISCECTOMY FUSION 3 LEVELS Cervical four/five,five/six,six/seven. Anterior cervical disectomy and fusion with peek and plate ;  Surgeon: [name], MD;  Location: [facility];  Service: Neurosurgery;  Laterality: N/A;   CARDIAC CATHETERIZATION N/A 07/02/2015   Procedure: Left Heart Cath and Coronary Angiography;  Surgeon: [name], MD;  Location: [facility];  Service: Cardiovascular;  Laterality: N/A;   CORONARY ANGIOPLASTY WITH STENT PLACEMENT  05/13/2012   DES to LAD; she has total RCA with left to rt coll. normal LV function done for positive nuc study   DILATION AND CURETTAGE OF UTERUS  1960's; 1970's; 1980   "probably 3" (05/13/2012)   FOREARM  / WRIST TENDON LESION EXCISION  ?11/2001   "right; did waver to try to get ulnar out of hole that it had cut" (05/13/2012   INGUINAL HERNIA REPAIR  ~ 1966; 07/21/2002   "right; left" (05/13/2012)   LEFT HEART CATHETERIZATION WITH CORONARY ANGIOGRAM N/A 05/13/2012   Procedure: LEFT HEART CATHETERIZATION WITH CORONARY ANGIOGRAM;  Surgeon: [name], MD;  Location: [facility] LAB;  Service: Cardiovascular;  Laterality: N/A;   OSTEOTOMY AND ULNAR SHORTENING  07/21/2002   "right" (05/13/2012)   TONSILLECTOMY AND ADENOIDECTOMY  1950's   TUBAL LIGATION  1980    FAMILY HISTORY Family History  Problem Relation Age of Onset   Hypertension Mother    Diabetes Mother    Hyperlipidemia Father    Heart disease Father    Stroke Father    Heart disease Sister    Diabetes Sister  Heart disease Brother    Diabetes Maternal Aunt     SOCIAL HISTORY Social History   Tobacco Use   Smoking status: Former    Packs/day: 0.50    Years: 20.00    Total pack years: 10.00    Types: Cigarettes    Quit date: 07/05/1988    Years since quitting: 33.3   Smokeless tobacco: Never  Substance Use Topics   Alcohol use: Yes    Alcohol/week: 12.0 standard drinks of alcohol    Types: 12 Glasses of wine per week    Comment: 05/13/2012 "6-8 oz glass of red wine q hs"    Drug use: No         OPHTHALMIC EXAM:  Base Eye Exam     Visual Acuity (ETDRS)       Right Left   Dist cc 20/25 -2 20/20 -1         Tonometry (Tonopen, 2:28 PM)       Right Left   Pressure 16 12         Pupils       Pupils APD   Right PERRL None   Left PERRL None         Extraocular Movement       Right Left    Full Full         Neuro/Psych     Oriented x3: Yes   Mood/Affect: Normal         Dilation     Right eye: 1.0% Mydriacyl, 2.5% Phenylephrine @ 2:28 PM           Slit Lamp and Fundus Exam     External Exam       Right Left   External Normal Normal         Slit Lamp Exam        Right Left   Lids/Lashes Normal Normal   Conjunctiva/Sclera White and quiet White and quiet   Cornea Clear Clear   Anterior Chamber Deep and quiet Deep and quiet   Iris Round and reactive Round and reactive   Lens 2+ Nuclear sclerosis 2+ Nuclear sclerosis   Anterior Vitreous Normal, no cells Normal         Fundus Exam       Right Left   Posterior Vitreous Normal,  no cells    Disc Normal    C/D Ratio 0.4    Macula Hard drusen, Microaneurysms, no macular thickening, no hemorrhage    Vessels Normal    Periphery Normal             IMAGING AND PROCEDURES  Imaging and Procedures for 11/06/21  Intravitreal Injection, Pharmacologic Agent - OD - Right Eye       Time Out 11/06/2021. 3:16 PM. Confirmed correct patient, procedure, site, and patient consented.   Anesthesia Topical anesthesia was used. Anesthetic medications included Lidocaine 4%.   Procedure Preparation included 5% betadine to ocular surface, 10% betadine to eyelids, Tobramycin 0.3%. A 30 gauge needle was used.   Injection: 2.5 mg bevacizumab 2.5 MG/0.1ML   Route: Intravitreal, Site: Right Eye   NDC: [phone]   Post-op Post injection exam found visual acuity of at least counting fingers. The patient tolerated the procedure well. There were no complications. The patient received written and verbal post procedure care education. Post injection medications included ocuflox.      OCT, Retina - OU - Both Eyes       Right Eye Quality was good.  Scan locations included subfoveal. Central Foveal Thickness: 258. Progression has improved. Findings include no IRF, no SRF, abnormal foveal contour.   Left Eye Quality was good. Scan locations included subfoveal. Central Foveal Thickness: 297. Progression has been stable. Findings include abnormal foveal contour.   Notes OD, much improved anatomy overall as compared to February 2022 onset of CNVM with intraretinal and subretinal fluid.  No residual subretinal  fluid or intraretinal fluid today at 9-week follow-up examination              ASSESSMENT/PLAN:  Exudative age-related macular degeneration of right eye with active choroidal neovascularization ([place]) OD today with resolved CNVM persistent at 9-week interval post injection Avastin.  We will repeat injection today on the treat and extend strategy, and extend interval next examination to 11 weeks  Nuclear sclerotic cataract of both eyes Stable overall  Vitreous floaters of right eye Stable OD  Intermediate stage nonexudative age-related macular degeneration of both eyes No sign of CNVM OS     ICD-10-CM   1. Exudative age-related macular degeneration of right eye with active choroidal neovascularization (HCC)  H35.3211 Intravitreal Injection, Pharmacologic Agent - OD - Right Eye    OCT, Retina - OU - Both Eyes    bevacizumab (AVASTIN) SOSY 2.5 mg    2. Nuclear sclerotic cataract of both eyes  H25.13     3. Vitreous floaters of right eye  H43.391     4. Intermediate stage nonexudative age-related macular degeneration of both eyes  H35.3132       1.  OD, stable today at 9-week follow-up interval post Avastin.  We will need to repeat today and we will now to extend interval examination to 11 weeks  2.  No sign of CNVM developing OS  3.  Ophthalmic Meds Ordered this visit:  Meds ordered this encounter  Medications   bevacizumab (AVASTIN) SOSY 2.5 mg       Return in about 11 weeks (around 01/22/2022) for DILATE OU, AVASTIN OCT, OD.  There are no Patient Instructions on file for this visit.   Explained the diagnoses, plan, and follow up with the patient and they expressed understanding.  Patient expressed understanding of the importance of proper follow up care.   Clent Demark Jailey Booton M.D. Diseases & Surgery of the Retina and Vitreous [facility] [place] 11/06/21     Abbreviations: M myopia (nearsighted); A astigmatism; H hyperopia (farsighted); P presbyopia;  Mrx spectacle prescription;  CTL contact lenses; OD right eye; OS left eye; OU both eyes  XT exotropia; ET esotropia; PEK punctate epithelial keratitis; PEE punctate epithelial erosions; DES dry eye syndrome; MGD meibomian gland dysfunction; ATs artificial tears; PFAT's preservative free artificial tears; [place] nuclear sclerotic cataract; PSC posterior subcapsular cataract; ERM epi-retinal membrane; PVD posterior vitreous detachment; RD retinal detachment; DM diabetes mellitus; DR diabetic retinopathy; NPDR non-proliferative diabetic retinopathy; PDR proliferative diabetic retinopathy; CSME clinically significant macular edema; DME diabetic macular edema; dbh dot blot hemorrhages; CWS cotton wool spot; POAG primary open angle glaucoma; C/D cup-to-disc ratio; HVF humphrey visual field; GVF goldmann visual field; OCT optical coherence tomography; IOP intraocular pressure; BRVO Branch retinal vein occlusion; CRVO central retinal vein occlusion; CRAO central retinal artery occlusion; BRAO branch retinal artery occlusion; RT retinal tear; SB scleral buckle; PPV pars plana vitrectomy; VH Vitreous hemorrhage; PRP panretinal laser photocoagulation; IVK intravitreal kenalog; VMT vitreomacular traction; MH Macular hole;  NVD neovascularization of the disc; NVE neovascularization elsewhere; AREDS age related eye disease study; ARMD age related  macular degeneration; POAG primary open angle glaucoma; EBMD epithelial/anterior basement membrane dystrophy; ACIOL anterior chamber intraocular lens; IOL intraocular lens; PCIOL posterior chamber intraocular lens; Phaco/IOL phacoemulsification with intraocular lens placement; [place] photorefractive keratectomy; LASIK laser assisted in situ keratomileusis; HTN hypertension; DM diabetes mellitus; COPD chronic obstructive pulmonary disease

## 2021-11-06 NOTE — Assessment & Plan Note (Signed)
OD today with resolved CNVM persistent at 9-week interval post injection Avastin.  We will repeat injection today on the treat and extend strategy, and extend interval next examination to 11 weeks

## 2021-11-06 NOTE — Assessment & Plan Note (Signed)
Stable overall.  

## 2021-11-07 DIAGNOSIS — H353122 Nonexudative age-related macular degeneration, left eye, intermediate dry stage: Secondary | ICD-10-CM | POA: Diagnosis not present

## 2021-11-12 DIAGNOSIS — K219 Gastro-esophageal reflux disease without esophagitis: Secondary | ICD-10-CM | POA: Diagnosis not present

## 2021-11-12 DIAGNOSIS — I10 Essential (primary) hypertension: Secondary | ICD-10-CM | POA: Diagnosis not present

## 2021-11-12 DIAGNOSIS — E039 Hypothyroidism, unspecified: Secondary | ICD-10-CM | POA: Diagnosis not present

## 2021-11-12 DIAGNOSIS — R7303 Prediabetes: Secondary | ICD-10-CM | POA: Diagnosis not present

## 2021-11-12 DIAGNOSIS — E782 Mixed hyperlipidemia: Secondary | ICD-10-CM | POA: Diagnosis not present

## 2021-11-12 DIAGNOSIS — L239 Allergic contact dermatitis, unspecified cause: Secondary | ICD-10-CM | POA: Diagnosis not present

## 2021-11-15 ENCOUNTER — Encounter: Payer: Self-pay | Admitting: Cardiovascular Disease

## 2021-11-15 ENCOUNTER — Ambulatory Visit: Payer: Medicare Other | Admitting: Cardiovascular Disease

## 2021-11-15 VITALS — BP 118/66 | HR 72 | Ht 61.0 in | Wt 133.0 lb

## 2021-11-15 DIAGNOSIS — I701 Atherosclerosis of renal artery: Secondary | ICD-10-CM | POA: Diagnosis not present

## 2021-11-15 DIAGNOSIS — I2510 Atherosclerotic heart disease of native coronary artery without angina pectoris: Secondary | ICD-10-CM | POA: Diagnosis not present

## 2021-11-15 DIAGNOSIS — E785 Hyperlipidemia, unspecified: Secondary | ICD-10-CM

## 2021-11-15 DIAGNOSIS — I10 Essential (primary) hypertension: Secondary | ICD-10-CM

## 2021-11-15 NOTE — Progress Notes (Signed)
11/15/2021 Brittany Figueroa   [birth year]  [id number]  Primary Physician Lajean Manes, MD Primary Cardiologist: [name] MD FACP, [place], [place], [place]  HPI:  Brittany Figueroa is a 81 y.o.  mildly overweight divorced Caucasian female, mother to 2, grandmother to 3 grandchildren, who I last saw in the office 05/28/2021. She has a history of hypertension and hyperlipidemia. I saw her in December of 2013 with new anterolateral T-wave inversion and a Myoview that showed inferolateral ischemia that was new compared to a prior study. She did have some unusual symptoms at that time. Based on this, I catheterized her on May 13, 2012, revealing an 80% proximal LAD lesion, which I stented using a drug-eluting stent, as well as a total dominant RCA with left to right collaterals and normal LV function. Since stenting her LAD, she is ultimately asymptomatic. Her other problems include hypertension and hyperlipidemia. I have reviewed her blood pressures, which were recorded during cardiac rehab, which were all normal. Her recent blood work revealed a total cholesterol of 161, LDL of 89 and HDL of 40.she denies chest pain or shortness of breath. She had a recent Myoview stress test performed several months ago that showed ischemia in the RCA territory but otherwise unremarkable is explainable by her known total dominant RCA with left to right collaterals. Chief complaint of excruciating neck pain thought to be related to cervical disc disease. She was evaluated by Dr. Because of her neck pain demonstrated cervical disc disease requiring fusion. Because of this she will need to undergo pharmacologic Myoview stress testing to risk stratify her. She underwent cervical discectomy by Dr. Deri Fuelling in 2016 with excellent clinical result. She no longer is in pain. Her major issue now is treatment of her hyperlipidemia. She is statin intolerant and complains of short-term memory loss.   She did contract COVID-19 in  July of last year but has recovered.  She is tolerating Repatha without side effects excellent lipid profile with LDL of 74 measured 03/14/2019.     Since I saw her in the office a year and a half ago she continues to do well.  I reviewed her home blood pressure monitoring results which were excellent.  Dr. Amedeo Plenty continues to follow her finger.  She denies chest pain or shortness of breath.   Current Meds  Medication Sig   acetaminophen (TYLENOL) 500 MG tablet Take 500 mg by mouth every 6 (six) hours as needed for mild pain or headache.   aspirin 81 MG tablet Take 81 mg by mouth daily.   ASPIRIN 81 PO 1 tablet   calcium-vitamin D (OSCAL WITH D) 500-200 MG-UNIT per tablet Take 1 tablet by mouth daily as needed.   carvedilol (COREG) 12.5 MG tablet Take 12.5 mg by mouth 2 (two) times daily with a meal.   Cholecalciferol (VITAMIN D3) 2000 UNITS TABS Take 2,000 Units by mouth 2 (two) times daily.   clopidogrel (PLAVIX) 75 MG tablet TAKE 1 TABLET(75 MG) BY MOUTH DAILY   diclofenac sodium (VOLTAREN) 1 % GEL Apply 2 g topically daily as needed. For pain   DILT-XR 240 MG 24 hr capsule Take 240 mg by mouth daily.   diltiazem (DILACOR XR) 240 MG 24 hr capsule TAKE 1 CAPSULE   ezetimibe (ZETIA) 10 MG tablet TAKE 1 TABLET(10 MG) BY MOUTH DAILY   fenofibrate 160 MG tablet TAKE 1 TABLET(160 MG) BY MOUTH DAILY   glucose blood (ONETOUCH VERIO) test strip Use to test blood  sugar once daily Dx code E11.9   irbesartan (AVAPRO) 300 MG tablet TAKE 1/2 TABLET BY MOUTH TWICE DAILY IN THE MORNING AND AT BEDTIME   levothyroxine (SYNTHROID) 25 MCG tablet TAKE 1 AND 1/2 TABLETS BY MOUTH DAILY BEFORE BREAKFAST   metFORMIN (GLUCOPHAGE-XR) 500 MG 24 hr tablet TAKE 2 TABLETS BY MOUTH DAILY WITH SUPPER   Multiple Vitamin (MULTIVITAMIN WITH MINERALS) TABS Take 1 tablet by mouth daily.   Multiple Vitamins-Minerals (PRESERVISION AREDS 2+MULTI VIT PO) Take 1 tablet by mouth in the morning and at bedtime.   nitroGLYCERIN  (NITROSTAT) 0.4 MG SL tablet PLACE 1 TABLET UNDER THE TONGUE EVERY 5 MINUTES AS NEEDED FOR CHEST PAIN   omega-3 acid ethyl esters (LOVAZA) 1 g capsule Take 2 capsules (2 g total) by mouth 2 (two) times daily.   REPATHA SURECLICK 191 MG/ML SOAJ INJECT 1 PEN UNDER THE SKIN EVERY 14 DAYS   triamterene-hydrochlorothiazide (MAXZIDE-25) 37.5-25 MG tablet Take 1 tablet by mouth daily. Patient taking  1/4 tablet     Allergies  Allergen Reactions   Statins Other (See Comments)    "intermittent loss of circulation; hands and arms will go to sleep; feet will get cramps; fatigue" (05/13/2012).  This occurred with Lipitor, Zocor, Crestor 5 mg qd, and she thinks pravastatin as well   Amlodipine Itching   Gadolinium Derivatives Itching and Nausea Only    Pt had severe nausea and itching on legs, neck and back, per dr [name] pt needs 13 hour prep before contrast in the future    Social History   Socioeconomic History   Marital status: Divorced    Spouse name: Not on file   Number of children: Not on file   Years of education: Not on file   Highest education level: Not on file  Occupational History   Not on file  Tobacco Use   Smoking status: Former    Packs/day: 0.50    Years: 20.00    Total pack years: 10.00    Types: Cigarettes    Quit date: 07/05/1988    Years since quitting: 33.3   Smokeless tobacco: Never  Substance and Sexual Activity   Alcohol use: Yes    Alcohol/week: 12.0 standard drinks of alcohol    Types: 12 Glasses of wine per week    Comment: 05/13/2012 "6-8 oz glass of red wine q hs"    Drug use: No   Sexual activity: Never  Other Topics Concern   Not on file  Social History Narrative   Not on file   Social Determinants of Health   Financial Resource Strain: Not on file  Food Insecurity: Not on file  Transportation Needs: Not on file  Physical Activity: Not on file  Stress: Not on file  Social Connections: Not on file  Intimate Partner Violence: Not on file      Review of Systems: General: negative for chills, fever, night sweats or weight changes.  Cardiovascular: negative for chest pain, dyspnea on exertion, edema, orthopnea, palpitations, paroxysmal nocturnal dyspnea or shortness of breath Dermatological: negative for rash Respiratory: negative for cough or wheezing Urologic: negative for hematuria Abdominal: negative for nausea, vomiting, diarrhea, bright red blood per rectum, melena, or hematemesis Neurologic: negative for visual changes, syncope, or dizziness All other systems reviewed and are otherwise negative except as noted above.    Blood pressure 118/66, pulse 72, height '5\' 1"'$  (1.549 m), weight 133 lb (60.3 kg).  General appearance: alert and no distress Neck: no adenopathy, no carotid  bruit, no JVD, supple, symmetrical, trachea midline, and thyroid not enlarged, symmetric, no tenderness/mass/nodules Lungs: clear to auscultation bilaterally Heart: regular rate and rhythm, S1, S2 normal, no murmur, click, rub or gallop Extremities: extremities normal, atraumatic, no cyanosis or edema Pulses: 2+ and symmetric Skin: Skin color, texture, turgor normal. No rashes or lesions Neurologic: Grossly normal  EKG sinus rhythm at 72 with septal Q waves and left axis deviation.  Personally reviewed this EKG.  ASSESSMENT AND PLAN:   CAD- residual total RCA and Dx disease History of CAD status post cardiac catheterization performed by myself 05/13/2012 revealing an 80% proximal LAD lesion which I stented using a drug-eluting stent.  She also had a total dominant RCA with left-to-right collaterals and normal LV function.  She is completely asymptomatic.  Dyslipidemia History of dyslipidemia on Repatha, Lovaza and Zetia with lipid profile performed 10/07/2021 revealing a total cholesterol 155, LDL 67 and HDL 48.  Essential hypertension History of essential hypertension a blood pressure measured today at 118/66.  She is on carvedilol, diltiazem  and Avapro.     [name] MD FACP,FACC,FAHA, [facility] 11/15/2021 10:34 AM

## 2021-11-15 NOTE — Assessment & Plan Note (Signed)
History of essential hypertension a blood pressure measured today at 118/66.  She is on carvedilol, diltiazem and Avapro.

## 2021-11-15 NOTE — Assessment & Plan Note (Signed)
History of CAD status post cardiac catheterization performed by myself 05/13/2012 revealing an 80% proximal LAD lesion which I stented using a drug-eluting stent.  She also had a total dominant RCA with left-to-right collaterals and normal LV function.  She is completely asymptomatic.

## 2021-11-15 NOTE — Patient Instructions (Signed)

## 2021-11-15 NOTE — Assessment & Plan Note (Addendum)
History of dyslipidemia on Repatha, Lovaza and Zetia with lipid profile performed 10/07/2021 revealing a total cholesterol 155, LDL 67 and HDL 48.

## 2021-12-07 DIAGNOSIS — H353122 Nonexudative age-related macular degeneration, left eye, intermediate dry stage: Secondary | ICD-10-CM | POA: Diagnosis not present

## 2021-12-25 DIAGNOSIS — L538 Other specified erythematous conditions: Secondary | ICD-10-CM | POA: Diagnosis not present

## 2021-12-25 DIAGNOSIS — L814 Other melanin hyperpigmentation: Secondary | ICD-10-CM | POA: Diagnosis not present

## 2021-12-25 DIAGNOSIS — L820 Inflamed seborrheic keratosis: Secondary | ICD-10-CM | POA: Diagnosis not present

## 2021-12-25 DIAGNOSIS — L821 Other seborrheic keratosis: Secondary | ICD-10-CM | POA: Diagnosis not present

## 2021-12-25 DIAGNOSIS — L570 Actinic keratosis: Secondary | ICD-10-CM | POA: Diagnosis not present

## 2022-01-06 DIAGNOSIS — H353122 Nonexudative age-related macular degeneration, left eye, intermediate dry stage: Secondary | ICD-10-CM | POA: Diagnosis not present

## 2022-01-23 ENCOUNTER — Encounter (INDEPENDENT_AMBULATORY_CARE_PROVIDER_SITE_OTHER): Payer: Medicare Other | Admitting: Ophthalmology

## 2022-01-28 ENCOUNTER — Encounter (INDEPENDENT_AMBULATORY_CARE_PROVIDER_SITE_OTHER): Payer: Self-pay | Admitting: Ophthalmology

## 2022-01-28 ENCOUNTER — Ambulatory Visit (INDEPENDENT_AMBULATORY_CARE_PROVIDER_SITE_OTHER): Payer: Medicare Other | Admitting: Ophthalmology

## 2022-01-28 DIAGNOSIS — H353132 Nonexudative age-related macular degeneration, bilateral, intermediate dry stage: Secondary | ICD-10-CM | POA: Diagnosis not present

## 2022-01-28 DIAGNOSIS — H353211 Exudative age-related macular degeneration, right eye, with active choroidal neovascularization: Secondary | ICD-10-CM

## 2022-01-28 DIAGNOSIS — H2513 Age-related nuclear cataract, bilateral: Secondary | ICD-10-CM

## 2022-01-28 DIAGNOSIS — H43391 Other vitreous opacities, right eye: Secondary | ICD-10-CM | POA: Diagnosis not present

## 2022-01-28 NOTE — Progress Notes (Signed)
01/28/2022     CHIEF COMPLAINT Patient presents for  Chief Complaint  Patient presents with   Macular Degeneration      HISTORY OF PRESENT ILLNESS: Brittany Figueroa is a 81 y.o. female who presents to the clinic today for:   HPI   Macular degeneration of right eye, possible injection OD   11 weeks dilate ou avastin oct od Pt states her vision has been stable Pt denies any floaters or FOL Pt states she believes her right eye cataract is bothering her vision  Last edited by Hurman Horn, MD on 01/28/2022  2:09 PM.      Referring physician: Lajean Manes, [place] [address] [place],  [place] [address]  HISTORICAL INFORMATION:   Selected notes from the MEDICAL RECORD NUMBER    Lab Results  Component Value Date   HGBA1C 6.1 10/07/2021     CURRENT MEDICATIONS: No current outpatient medications on file. (Ophthalmic Drugs)   No current facility-administered medications for this visit. (Ophthalmic Drugs)   Current Outpatient Medications (Other)  Medication Sig   acetaminophen (TYLENOL) 500 MG tablet Take 500 mg by mouth every 6 (six) hours as needed for mild pain or headache.   aspirin 81 MG tablet Take 81 mg by mouth daily.   ASPIRIN 81 PO 1 tablet   calcium-vitamin D (OSCAL WITH D) 500-200 MG-UNIT per tablet Take 1 tablet by mouth daily as needed.   carvedilol (COREG) 12.5 MG tablet Take 12.5 mg by mouth 2 (two) times daily with a meal.   Cholecalciferol (VITAMIN D3) 2000 UNITS TABS Take 2,000 Units by mouth 2 (two) times daily.   clopidogrel (PLAVIX) 75 MG tablet TAKE 1 TABLET(75 MG) BY MOUTH DAILY   diclofenac sodium (VOLTAREN) 1 % GEL Apply 2 g topically daily as needed. For pain   DILT-XR 240 MG 24 hr capsule Take 240 mg by mouth daily.   diltiazem (DILACOR XR) 240 MG 24 hr capsule TAKE 1 CAPSULE   ezetimibe (ZETIA) 10 MG tablet TAKE 1 TABLET(10 MG) BY MOUTH DAILY   fenofibrate 160 MG tablet TAKE 1 TABLET(160 MG) BY MOUTH DAILY   glucose blood  (ONETOUCH VERIO) test strip Use to test blood sugar once daily Dx code E11.9   irbesartan (AVAPRO) 300 MG tablet TAKE 1/2 TABLET BY MOUTH TWICE DAILY IN THE MORNING AND AT BEDTIME   levothyroxine (SYNTHROID) 25 MCG tablet TAKE 1 AND 1/2 TABLETS BY MOUTH DAILY BEFORE BREAKFAST   metFORMIN (GLUCOPHAGE-XR) 500 MG 24 hr tablet TAKE 2 TABLETS BY MOUTH DAILY WITH SUPPER   Multiple Vitamin (MULTIVITAMIN WITH MINERALS) TABS Take 1 tablet by mouth daily.   Multiple Vitamins-Minerals (PRESERVISION AREDS 2+MULTI VIT PO) Take 1 tablet by mouth in the morning and at bedtime.   nitroGLYCERIN (NITROSTAT) 0.4 MG SL tablet PLACE 1 TABLET UNDER THE TONGUE EVERY 5 MINUTES AS NEEDED FOR CHEST PAIN   omega-3 acid ethyl esters (LOVAZA) 1 g capsule Take 2 capsules (2 g total) by mouth 2 (two) times daily.   REPATHA SURECLICK 518 MG/ML SOAJ INJECT 1 PEN UNDER THE SKIN EVERY 14 DAYS   triamterene-hydrochlorothiazide (MAXZIDE-25) 37.5-25 MG tablet Take 1 tablet by mouth daily. Patient taking  1/4 tablet   No current facility-administered medications for this visit. (Other)      REVIEW OF SYSTEMS: ROS   Negative for: Constitutional, Gastrointestinal, Neurological, Skin, Genitourinary, Musculoskeletal, HENT, Endocrine, Cardiovascular, Eyes, Respiratory, Psychiatric, Allergic/Imm, Heme/Lymph Last edited by Morene Rankins, CMA on 01/28/2022  1:29 PM.       ALLERGIES Allergies  Allergen Reactions   Statins Other (See Comments)    "intermittent loss of circulation; hands and arms will go to sleep; feet will get cramps; fatigue" (05/13/2012).  This occurred with Lipitor, Zocor, Crestor 5 mg qd, and she thinks pravastatin as well   Amlodipine Itching   Gadolinium Derivatives Itching and Nausea Only    Pt had severe nausea and itching on legs, neck and back, per dr [name] pt needs 13 hour prep before contrast in the future    PAST MEDICAL HISTORY Past Medical History:  Diagnosis Date   Abnormal finding on EKG,  new anterolateral T-wave inversions  05/14/2012   Abnormal nuclear stress test, with infero-lateral ischemia 05/14/2012   Arthritis    "right thumb; all my fingers" (05/13/2012), cervical spondylosis    Atypical angina ([place]) 05/14/2012   CAD (coronary artery disease), 05/13/12, with 80% LAD 05/14/2012   Cancer ([place])    basal cell on facial in L temporal region     [facility] arthritis, thumb, degenerative    Complication of anesthesia 12/2009   "had endoscopy; larynx went into spasms; stopped breathing for 15 seconds" (05/13/2012)   GERD (gastroesophageal reflux disease) 2011   Hypercholesteremia    Hypertension    S/P angioplasty with stent, LAD 05/13/12 05/14/2012   Tuberculosis    test +- [place] [facility] - Dr. [name]- CXR- OK   Past Surgical History:  Procedure Laterality Date   ANTERIOR CERVICAL DECOMP/DISCECTOMY FUSION N/A 12/13/2013   Procedure: ANTERIOR CERVICAL DECOMPRESSION/DISCECTOMY FUSION 3 LEVELS Cervical four/five,five/six,six/seven. Anterior cervical disectomy and fusion with peek and plate ;  Surgeon: [name], MD;  Location: [facility];  Service: Neurosurgery;  Laterality: N/A;   CARDIAC CATHETERIZATION N/A 07/02/2015   Procedure: Left Heart Cath and Coronary Angiography;  Surgeon: [name], MD;  Location: [facility];  Service: Cardiovascular;  Laterality: N/A;   CORONARY ANGIOPLASTY WITH STENT PLACEMENT  05/13/2012   DES to LAD; she has total RCA with left to rt coll. normal LV function done for positive nuc study   DILATION AND CURETTAGE OF UTERUS  1960's; 1970's; 1980   "probably 3" (05/13/2012)   FOREARM / WRIST TENDON LESION EXCISION  ?11/2001   "right; did waver to try to get ulnar out of hole that it had cut" (05/13/2012   INGUINAL HERNIA REPAIR  ~ 1966; 07/21/2002   "right; left" (05/13/2012)   LEFT HEART CATHETERIZATION WITH CORONARY ANGIOGRAM N/A 05/13/2012   Procedure: LEFT HEART CATHETERIZATION WITH CORONARY ANGIOGRAM;  Surgeon: [name], MD;   Location: [facility];  Service: Cardiovascular;  Laterality: N/A;   OSTEOTOMY AND ULNAR SHORTENING  07/21/2002   "right" (05/13/2012)   TONSILLECTOMY AND ADENOIDECTOMY  1950's   TUBAL LIGATION  1980    FAMILY HISTORY Family History  Problem Relation Age of Onset   Hypertension Mother    Diabetes Mother    Hyperlipidemia Father    Heart disease Father    Stroke Father    Heart disease Sister    Diabetes Sister    Heart disease Brother    Diabetes Maternal Aunt     SOCIAL HISTORY Social History   Tobacco Use   Smoking status: Former    Packs/day: 0.50    Years: 20.00    Total pack years: 10.00    Types: Cigarettes    Quit date: 07/05/1988    Years since quitting: 33.5   Smokeless tobacco:  Never  Substance Use Topics   Alcohol use: Yes    Alcohol/week: 12.0 standard drinks of alcohol    Types: 12 Glasses of wine per week    Comment: 05/13/2012 "6-8 oz glass of red wine q hs"    Drug use: No         OPHTHALMIC EXAM:  Base Eye Exam     Visual Acuity (ETDRS)       Right Left   Dist cc 20/25 +1 20/20 -1    Correction: Glasses         Tonometry (Tonopen, 1:35 PM)       Right Left   Pressure 6 11         Pupils       Pupils   Right PERRL   Left PERRL         Visual Fields       Left Right    Full Full         Extraocular Movement       Right Left    Full, Ortho Full, Ortho         Neuro/Psych     Oriented x3: Yes   Mood/Affect: Normal         Dilation     Both eyes: 1.0% Mydriacyl, 2.5% Phenylephrine @ 1:31 PM           Slit Lamp and Fundus Exam     External Exam       Right Left   External Normal Normal         Slit Lamp Exam       Right Left   Lids/Lashes Normal Normal   Conjunctiva/Sclera White and quiet White and quiet   Cornea Clear Clear   Anterior Chamber Deep and quiet Deep and quiet   Iris Round and reactive Round and reactive   Lens 3+ Nuclear sclerosis 3+ Nuclear sclerosis   Anterior  Vitreous Normal, no cells Normal         Fundus Exam       Right Left   Posterior Vitreous Posterior vitreous detachment Posterior vitreous detachment   Disc Normal Normal   C/D Ratio 0.4 0.4   Macula Hard drusen, Microaneurysms, no macular thickening, no hemorrhage Intermediate age related macular degeneration   Vessels Normal Normal   Periphery Normal Normal            IMAGING AND PROCEDURES  Imaging and Procedures for 01/28/22  OCT, Retina - OU - Both Eyes       Right Eye Quality was good. Scan locations included subfoveal. Central Foveal Thickness: 260. Progression has improved. Findings include no IRF, no SRF, abnormal foveal contour.   Left Eye Quality was good. Scan locations included subfoveal. Central Foveal Thickness: 292. Progression has been stable. Findings include abnormal foveal contour.   Notes OD, much improved anatomy overall as compared to February 2022 onset of CNVM with intraretinal and subretinal fluid.  No residual subretinal fluid or intraretinal fluid today at 11-week follow-up examination             ASSESSMENT/PLAN:  Nuclear sclerotic cataract of both eyes The nature of cataract was discussed with the patient as well as the elective nature of surgery. The patient was reassured that surgery at a later date does not put the patient at risk for a worse outcome. It was emphasized that the need for surgery is dictated by the patient's quality of life as influenced by the cataract. Patient  was instructed to maintain close follow up with their general eye care doctor.  Cataract(s) account for the patient's complaint. I discussed the risks and benefits of cataract surgery. Options were explained to the patient. The patient understands that new glasses may not improve their vision and desires to have cataract surgery. I have recommended follow up with their general eye care doctor for evaluation and consideration of cataract extraction with new  intraocular lens insertion. Fu Dr. Sharren Bridge,, likley needs CEIOL to dark rainy days, and night driving  Vitreous floaters of right eye Stable OD  Intermediate stage nonexudative age-related macular degeneration of both eyes Stable OS no sign of CNVM     ICD-10-CM   1. Exudative age-related macular degeneration of right eye with active choroidal neovascularization (HCC)  H35.3211 OCT, Retina - OU - Both Eyes    2. Nuclear sclerotic cataract of both eyes  H25.13     3. Vitreous floaters of right eye  H43.391     4. Intermediate stage nonexudative age-related macular degeneration of both eyes  H35.3132       1.  OD today no sign of wet AMD continuing, will continue to monitor and observe  2.  Bilaterally visually significant cataract.  Patient states she has trouble sitting on dark rainy days at nighttime.  3.  Follow-up Dr. Stephannie Li and possibly Dr. Talbert Forest to consider cataract extraction with intraocular lens placement in the future  Ophthalmic Meds Ordered this visit:  No orders of the defined types were placed in this encounter.      Return in about 3 months (around 04/29/2022) for DILATE OU, OCT.  There are no Patient Instructions on file for this visit.   Explained the diagnoses, plan, and follow up with the patient and they expressed understanding.  Patient expressed understanding of the importance of proper follow up care.   Clent Demark Derric Dealmeida M.D. Diseases & Surgery of the Retina and Vitreous [facility] [place] 01/28/22     Abbreviations: M myopia (nearsighted); A astigmatism; H hyperopia (farsighted); P presbyopia; Mrx spectacle prescription;  CTL contact lenses; OD right eye; OS left eye; OU both eyes  XT exotropia; ET esotropia; PEK punctate epithelial keratitis; PEE punctate epithelial erosions; DES dry eye syndrome; MGD meibomian gland dysfunction; ATs artificial tears; PFAT's preservative free artificial tears; [place] nuclear sclerotic cataract; PSC  posterior subcapsular cataract; ERM epi-retinal membrane; PVD posterior vitreous detachment; RD retinal detachment; DM diabetes mellitus; DR diabetic retinopathy; NPDR non-proliferative diabetic retinopathy; PDR proliferative diabetic retinopathy; CSME clinically significant macular edema; DME diabetic macular edema; dbh dot blot hemorrhages; CWS cotton wool spot; POAG primary open angle glaucoma; C/D cup-to-disc ratio; HVF humphrey visual field; GVF goldmann visual field; OCT optical coherence tomography; IOP intraocular pressure; BRVO Branch retinal vein occlusion; CRVO central retinal vein occlusion; CRAO central retinal artery occlusion; BRAO branch retinal artery occlusion; RT retinal tear; SB scleral buckle; PPV pars plana vitrectomy; VH Vitreous hemorrhage; PRP panretinal laser photocoagulation; IVK intravitreal kenalog; VMT vitreomacular traction; MH Macular hole;  NVD neovascularization of the disc; NVE neovascularization elsewhere; AREDS age related eye disease study; ARMD age related macular degeneration; POAG primary open angle glaucoma; EBMD epithelial/anterior basement membrane dystrophy; ACIOL anterior chamber intraocular lens; IOL intraocular lens; PCIOL posterior chamber intraocular lens; Phaco/IOL phacoemulsification with intraocular lens placement; [place] photorefractive keratectomy; LASIK laser assisted in situ keratomileusis; HTN hypertension; DM diabetes mellitus; COPD chronic obstructive pulmonary disease

## 2022-01-28 NOTE — Assessment & Plan Note (Signed)
Stable OD °

## 2022-01-28 NOTE — Assessment & Plan Note (Signed)
The nature of cataract was discussed with the patient as well as the elective nature of surgery. The patient was reassured that surgery at a later date does not put the patient at risk for a worse outcome. It was emphasized that the need for surgery is dictated by the patient's quality of life as influenced by the cataract. Patient was instructed to maintain close follow up with their general eye care doctor.  Cataract(s) account for the patient's complaint. I discussed the risks and benefits of cataract surgery. Options were explained to the patient. The patient understands that new glasses may not improve their vision and desires to have cataract surgery. I have recommended follow up with their general eye care doctor for evaluation and consideration of cataract extraction with new intraocular lens insertion. Fu Dr. Sharren Bridge,, likley needs CEIOL to dark rainy days, and night driving

## 2022-01-28 NOTE — Assessment & Plan Note (Signed)
Stable OS no sign of CNVM

## 2022-01-29 ENCOUNTER — Encounter (INDEPENDENT_AMBULATORY_CARE_PROVIDER_SITE_OTHER): Payer: Medicare Other | Admitting: Ophthalmology

## 2022-02-04 DIAGNOSIS — Z1231 Encounter for screening mammogram for malignant neoplasm of breast: Secondary | ICD-10-CM | POA: Diagnosis not present

## 2022-02-04 DIAGNOSIS — Z6824 Body mass index (BMI) 24.0-24.9, adult: Secondary | ICD-10-CM | POA: Diagnosis not present

## 2022-02-04 DIAGNOSIS — M545 Low back pain, unspecified: Secondary | ICD-10-CM | POA: Diagnosis not present

## 2022-02-05 ENCOUNTER — Other Ambulatory Visit (INDEPENDENT_AMBULATORY_CARE_PROVIDER_SITE_OTHER): Payer: Medicare Other

## 2022-02-05 DIAGNOSIS — R7303 Prediabetes: Secondary | ICD-10-CM

## 2022-02-05 DIAGNOSIS — E039 Hypothyroidism, unspecified: Secondary | ICD-10-CM

## 2022-02-05 DIAGNOSIS — H353122 Nonexudative age-related macular degeneration, left eye, intermediate dry stage: Secondary | ICD-10-CM | POA: Diagnosis not present

## 2022-02-05 DIAGNOSIS — E559 Vitamin D deficiency, unspecified: Secondary | ICD-10-CM | POA: Diagnosis not present

## 2022-02-05 LAB — BASIC METABOLIC PANEL
BUN: 31 mg/dL — ABNORMAL HIGH (ref 6–23)
CO2: 25 mEq/L (ref 19–32)
Calcium: 10.3 mg/dL (ref 8.4–10.5)
Chloride: 99 mEq/L (ref 96–112)
Creatinine, Ser: 0.9 mg/dL (ref 0.40–1.20)
GFR: 61.01 mL/min (ref >60.00)
Glucose, Bld: 100 mg/dL — ABNORMAL HIGH (ref 70–99)
Potassium: 4.6 mEq/L (ref 3.5–5.1)
Sodium: 131 mEq/L — ABNORMAL LOW (ref 135–145)

## 2022-02-05 LAB — HEMOGLOBIN A1C: Hgb A1c MFr Bld: 6.4 % (ref 4.6–6.5)

## 2022-02-05 LAB — T4, FREE: Free T4: 1.15 ng/dL (ref 0.60–1.60)

## 2022-02-05 LAB — TSH: TSH: 3.66 u[IU]/mL (ref 0.35–5.50)

## 2022-02-05 LAB — VITAMIN D 25 HYDROXY (VIT D DEFICIENCY, FRACTURES): VITD: 46.56 ng/mL (ref 30.00–100.00)

## 2022-02-11 ENCOUNTER — Other Ambulatory Visit: Payer: Medicare Other

## 2022-02-11 ENCOUNTER — Ambulatory Visit: Payer: Medicare Other | Admitting: Endocrinology

## 2022-02-11 DIAGNOSIS — R7303 Prediabetes: Secondary | ICD-10-CM | POA: Diagnosis not present

## 2022-02-11 DIAGNOSIS — Z780 Asymptomatic menopausal state: Secondary | ICD-10-CM | POA: Diagnosis not present

## 2022-02-11 DIAGNOSIS — M8580 Other specified disorders of bone density and structure, unspecified site: Secondary | ICD-10-CM | POA: Diagnosis not present

## 2022-02-11 DIAGNOSIS — E039 Hypothyroidism, unspecified: Secondary | ICD-10-CM | POA: Diagnosis not present

## 2022-02-11 NOTE — Progress Notes (Unsigned)
Patient ID: Brittany Figueroa, female   DOB: [birth year], 81 y.o.   MRN: [id number]           Chief complaint: Endocrinology follow-up    History of Present Illness:  PREVIOUS history: She may have been told about 10 years ago that she had prediabetes and reportedly had a normal glucose tolerance test. She was told by her PCP that she has type 2 diabetes based on an A1c of 6.5% in 07/2013 She has not had any other A1c is subsequently above normal, normal range and previous lab 6.2 or less She also has not had any abnormal blood sugars except a glucose of 125 in 3/16  Has history of increased fasting blood sugars ranging from 108-125 since about 2012  GLUCOSE TOLERANCE test on 05/23/15: Fasting glucose 110, two-hour glucose 192  RECENT history:   She has impaired fasting glucose and glucose intolerance  She has been intermittently on metformin ER since about 08/2015 In 1/18 when A1c was 6.5 and glucose 133 fasting she was told to start back on metformin and also improve her diet She was taking 1500 mg a day but could not tolerate this because of feeling of bloating  Current regimen: Metformin ER 500 mg twice daily   A1c is 6.4 compared to 6.1  Current blood sugar readings, management and history    She is not checking her blood sugars much and only sporadically Unclear whether her meter has a right date programmed as there are no readings since 9/7 She only shows 3 fasting readings between 93 and 100 in the last 30 days readings around 90 late afternoon She is still continuing metformin without GI side effects  She does generally watch her diet with healthy meals Currently not able to exercise or walk much because of leg and foot pains   Using freestyle neo monitor Blood sugar data is as above   Weight history: Previous range 139-143  Wt Readings from Last 3 Encounters:  11/15/21 133 lb (60.3 kg)  10/31/21 132 lb (59.9 kg)  10/10/21 133 lb (60.3 kg)    Lab Results   Component Value Date   HGBA1C 6.4 02/05/2022   HGBA1C 6.1 10/07/2021   HGBA1C 6.3 06/03/2021   Lab Results  Component Value Date   MICROALBUR <0.7 07/17/2020   LDLCALC 67 10/07/2021   CREATININE 0.90 02/05/2022   HYPOTHYROIDISM and other problems reviewed today: See review of systems    Lab on 02/05/2022  Component Date Value Ref Range Status   VITD 02/05/2022 46.56  30.00 - 100.00 ng/mL Final   Free T4 02/05/2022 1.15  0.60 - 1.60 ng/dL Final   Comment: Specimens from patients who are undergoing biotin therapy and /or ingesting biotin supplements may contain high levels of biotin.  The higher biotin concentration in these specimens interferes with this Free T4 assay.  Specimens that contain high levels  of biotin may cause false high results for this Free T4 assay.  Please interpret results in light of the total clinical presentation of the patient.     TSH 02/05/2022 3.66  0.35 - 5.50 uIU/mL Final   Sodium 02/05/2022 131 (L)  135 - 145 mEq/L Final   Potassium 02/05/2022 4.6  3.5 - 5.1 mEq/L Final   Chloride 02/05/2022 99  96 - 112 mEq/L Final   CO2 02/05/2022 25  19 - 32 mEq/L Final   Glucose, Bld 02/05/2022 100 (H)  70 - 99 mg/dL Final   BUN 02/05/2022 31 (H)  6 - 23 mg/dL Final   Creatinine, Ser 02/05/2022 0.90  0.40 - 1.20 mg/dL Final   GFR 02/05/2022 61.01  >60.00 mL/min Final   Calculated using the CKD-EPI Creatinine Equation (2021)   Calcium 02/05/2022 10.3  8.4 - 10.5 mg/dL Final   Hgb A1c MFr Bld 02/05/2022 6.4  4.6 - 6.5 % Final   Glycemic Control Guidelines for People with Diabetes:Non Diabetic:  <6%Goal of Therapy: <7%Additional Action Suggested:  >8%       Past Medical History:  Diagnosis Date   Abnormal finding on EKG, new anterolateral T-wave inversions  05/14/2012   Abnormal nuclear stress test, with infero-lateral ischemia 05/14/2012   Arthritis    "right thumb; all my fingers" (05/13/2012), cervical spondylosis    Atypical angina ([place]) 05/14/2012    CAD (coronary artery disease), 05/13/12, with 80% LAD 05/14/2012   Cancer ([place])    basal cell on facial in L temporal region     [facility] arthritis, thumb, degenerative    Complication of anesthesia 12/2009   "had endoscopy; larynx went into spasms; stopped breathing for 15 seconds" (05/13/2012)   GERD (gastroesophageal reflux disease) 2011   Hypercholesteremia    Hypertension    S/P angioplasty with stent, LAD 05/13/12 05/14/2012   Tuberculosis    test +- [place] [facility] - Dr. [name]- CXR- OK    Past Surgical History:  Procedure Laterality Date   ANTERIOR CERVICAL DECOMP/DISCECTOMY FUSION N/A 12/13/2013   Procedure: ANTERIOR CERVICAL DECOMPRESSION/DISCECTOMY FUSION 3 LEVELS Cervical four/five,five/six,six/seven. Anterior cervical disectomy and fusion with peek and plate ;  Surgeon: [name], MD;  Location: [facility];  Service: Neurosurgery;  Laterality: N/A;   CARDIAC CATHETERIZATION N/A 07/02/2015   Procedure: Left Heart Cath and Coronary Angiography;  Surgeon: [name], MD;  Location: [facility];  Service: Cardiovascular;  Laterality: N/A;   CORONARY ANGIOPLASTY WITH STENT PLACEMENT  05/13/2012   DES to LAD; she has total RCA with left to rt coll. normal LV function done for positive nuc study   DILATION AND CURETTAGE OF UTERUS  1960's; 1970's; 1980   "probably 3" (05/13/2012)   FOREARM / WRIST TENDON LESION EXCISION  ?11/2001   "right; did waver to try to get ulnar out of hole that it had cut" (05/13/2012   INGUINAL HERNIA REPAIR  ~ 1966; 07/21/2002   "right; left" (05/13/2012)   LEFT HEART CATHETERIZATION WITH CORONARY ANGIOGRAM N/A 05/13/2012   Procedure: LEFT HEART CATHETERIZATION WITH CORONARY ANGIOGRAM;  Surgeon: [name], MD;  [facility];  Service: Cardiovascular;  Laterality: N/A;   OSTEOTOMY AND ULNAR SHORTENING  07/21/2002   "right" (05/13/2012)   TONSILLECTOMY AND ADENOIDECTOMY  1950's   TUBAL LIGATION  1980    Family History  Problem  Relation Age of Onset   Hypertension Mother    Diabetes Mother    Hyperlipidemia Father    Heart disease Father    Stroke Father    Heart disease Sister    Diabetes Sister    Heart disease Brother    Diabetes Maternal Aunt     Social History:  reports that she quit smoking about 33 years ago. Her smoking use included cigarettes. She has a 10.00 pack-year smoking history. She has never used smokeless tobacco. She reports current alcohol use of about 12.0 standard drinks of alcohol per week. She reports that she does not use drugs.  Allergies:  Allergies  Allergen Reactions   Statins Other (See Comments)    "  intermittent loss of circulation; hands and arms will go to sleep; feet will get cramps; fatigue" (05/13/2012).  This occurred with Lipitor, Zocor, Crestor 5 mg qd, and she thinks pravastatin as well   Amlodipine Itching   Gadolinium Derivatives Itching and Nausea Only    Pt had severe nausea and itching on legs, neck and back, per dr [name] pt needs 13 hour prep before contrast in the future    Allergies as of 02/11/2022       Reactions   Statins Other (See Comments)   "intermittent loss of circulation; hands and arms will go to sleep; feet will get cramps; fatigue" (05/13/2012).  This occurred with Lipitor, Zocor, Crestor 5 mg qd, and she thinks pravastatin as well   Amlodipine Itching   Gadolinium Derivatives Itching, Nausea Only   Pt had severe nausea and itching on legs, neck and back, per dr [name] pt needs 13 hour prep before contrast in the future        Medication List        Accurate as of February 11, 2022 11:59 PM. If you have any questions, ask your nurse or doctor.          acetaminophen 500 MG tablet Commonly known as: TYLENOL Take 500 mg by mouth every 6 (six) hours as needed for mild pain or headache.   ASPIRIN 81 PO 1 tablet   aspirin 81 MG tablet Take 81 mg by mouth daily.   calcium-vitamin D 500-200 MG-UNIT tablet Commonly known as:  OSCAL WITH D Take 1 tablet by mouth daily as needed.   carvedilol 12.5 MG tablet Commonly known as: COREG Take 12.5 mg by mouth 2 (two) times daily with a meal.   clopidogrel 75 MG tablet Commonly known as: PLAVIX TAKE 1 TABLET(75 MG) BY MOUTH DAILY   diclofenac sodium 1 % Gel Commonly known as: VOLTAREN Apply 2 g topically daily as needed. For pain   diltiazem 240 MG 24 hr capsule Commonly known as: DILACOR XR TAKE 1 CAPSULE   Dilt-XR 240 MG 24 hr capsule Generic drug: diltiazem Take 240 mg by mouth daily.   ezetimibe 10 MG tablet Commonly known as: ZETIA TAKE 1 TABLET(10 MG) BY MOUTH DAILY   fenofibrate 160 MG tablet TAKE 1 TABLET(160 MG) BY MOUTH DAILY   glucose blood test strip Commonly known as: OneTouch Verio Use to test blood sugar once daily Dx code E11.9   irbesartan 300 MG tablet Commonly known as: AVAPRO TAKE 1/2 TABLET BY MOUTH TWICE DAILY IN THE MORNING AND AT BEDTIME   levothyroxine 25 MCG tablet Commonly known as: SYNTHROID TAKE 1 AND 1/2 TABLETS BY MOUTH DAILY BEFORE BREAKFAST   metFORMIN 500 MG 24 hr tablet Commonly known as: GLUCOPHAGE-XR TAKE 2 TABLETS BY MOUTH DAILY WITH SUPPER   multivitamin with minerals Tabs tablet Take 1 tablet by mouth daily.   nitroGLYCERIN 0.4 MG SL tablet Commonly known as: NITROSTAT PLACE 1 TABLET UNDER THE TONGUE EVERY 5 MINUTES AS NEEDED FOR CHEST PAIN   omega-3 acid ethyl esters 1 g capsule Commonly known as: LOVAZA Take 2 capsules (2 g total) by mouth 2 (two) times daily.   PRESERVISION AREDS 2+MULTI VIT PO Take 1 tablet by mouth in the morning and at bedtime.   Repatha SureClick 347 MG/ML [place] Generic drug: Evolocumab INJECT 1 PEN UNDER THE SKIN EVERY 14 DAYS   triamterene-hydrochlorothiazide 37.5-25 MG tablet Commonly known as: MAXZIDE-25 Take 1 tablet by mouth daily. Patient taking  1/4 tablet  Vitamin D3 50 MCG (2000 UT) Tabs Take 2,000 Units by mouth 2 (two) times daily.         LABS:  Lab on 02/05/2022  Component Date Value Ref Range Status   VITD 02/05/2022 46.56  30.00 - 100.00 ng/mL Final   Free T4 02/05/2022 1.15  0.60 - 1.60 ng/dL Final   Comment: Specimens from patients who are undergoing biotin therapy and /or ingesting biotin supplements may contain high levels of biotin.  The higher biotin concentration in these specimens interferes with this Free T4 assay.  Specimens that contain high levels  of biotin may cause false high results for this Free T4 assay.  Please interpret results in light of the total clinical presentation of the patient.     TSH 02/05/2022 3.66  0.35 - 5.50 uIU/mL Final   Sodium 02/05/2022 131 (L)  135 - 145 mEq/L Final   Potassium 02/05/2022 4.6  3.5 - 5.1 mEq/L Final   Chloride 02/05/2022 99  96 - 112 mEq/L Final   CO2 02/05/2022 25  19 - 32 mEq/L Final   Glucose, Bld 02/05/2022 100 (H)  70 - 99 mg/dL Final   BUN 02/05/2022 31 (H)  6 - 23 mg/dL Final   Creatinine, Ser 02/05/2022 0.90  0.40 - 1.20 mg/dL Final   GFR 02/05/2022 61.01  >60.00 mL/min Final   Calculated using the CKD-EPI Creatinine Equation (2021)   Calcium 02/05/2022 10.3  8.4 - 10.5 mg/dL Final   Hgb A1c MFr Bld 02/05/2022 6.4  4.6 - 6.5 % Final   Glycemic Control Guidelines for People with Diabetes:Non Diabetic:  <6%Goal of Therapy: <7%Additional Action Suggested:  >8%       Review of Systems    OSTEOPENIA:   Lowest T score is at the right neck femur previously -2.4  This is now only 0.8 as of 11/14/2019  However BMD at the left femoral neck is last -2.0 compared to -1.8  Apparently had tried Fosamax in the past and this caused some GI side effects She received RECLAST infusions on 12/15/17, 01/07/2020 and also 6/23   Vitamin D deficiency: She is taking OTC vitamin D 4000 U daily  Last vitamin D level is 39.3, last 61  HYPERTENSION: Treated with Avapro, diltiazem, maxzide quarter tablet and Coreg, Followed by PCP.  Home BP has been checked  also  HYPERLIPIDEMIA: Followed by cardiology History of hyperlipidemia with some intolerance to statins, on Repatha and also on Zetia.   Has high triglycerides, with the following triglycerides   Last fasting lipids as follows  Lab Results  Component Value Date   CHOL 155 10/07/2021   CHOL 162 10/29/2020   CHOL 169 08/22/2020   Lab Results  Component Value Date   HDL 48.40 10/07/2021   HDL 47 10/29/2020   HDL 47 08/22/2020   Lab Results  Component Value Date   LDLCALC 67 10/07/2021   LDLCALC 76 10/29/2020   LDLCALC 83 08/22/2020   Lab Results  Component Value Date   TRIG 200.0 (H) 10/07/2021   TRIG 239 (H) 10/29/2020   TRIG 236 (H) 08/22/2020   Lab Results  Component Value Date   CHOLHDL 3 10/07/2021   CHOLHDL 3.4 10/29/2020   CHOLHDL 3.6 08/22/2020   Lab Results  Component Value Date   LDLDIRECT 130.1 04/04/2014       HYPOTHYROIDISM: TSH level previously has been as high as 5.6  This spontaneously improved but in 05/2018 was mildly increased again  At that time  she was complaining of some fatigue, constipation, dry skin, some cold intolerance and difficulty losing weight.  She is taking levothyroxine 37.5 mcg daily, she did have some improvement in her fatigue with the higher dose   Her TSH is usually quite normal  Lab Results  Component Value Date   TSH 3.66 02/05/2022   TSH 3.05 06/03/2021   TSH 2.68 02/25/2021   FREET4 1.15 02/05/2022   FREET4 1.29 06/03/2021   FREET4 1.16 02/25/2021   CALCIUM levels: Usually has quite normal calcium levels usually below 10 and now is unusually high at 11.4 Has not had any excessive vitamin D usage  Lab Results  Component Value Date   CALCIUM 10.3 02/05/2022     PHYSICAL EXAM:  There were no vitals taken for this visit.     ASSESSMENT:   Prediabetes with impaired fasting glucose and also impaired glucose tolerance  Most of her A1c and blood sugars in the past have been in the prediabetic range   Transiently she has had high blood sugars over 125 and A1c up to 6.7 partly from steroids  Her A1c is slightly higher but still in the prediabetic range at 6.4  She is on 500 mg of Metformin twice a day  Weight is stable, has limitations with her exercise regimen Lab glucose 100 fasting HYPOTHYROIDISM: Stable   Mild hyponatremia: Again stable and asymptomatic, now 131 likely to be from HCTZ  OSTEOPENIA: Has had 3 infusions of Reclast with last bone density improved  PLAN:   She will continue metformin unchanged and monitor her blood sugars more consistently at different times including after meals  She will continue 1-1/2 tablets of 25 mcg levothyroxine for her mild hypothyroidism  Encouraged her to discuss stopping HCTZ with her PCP since she has chronic hyponatremia and this could be potentially causing any central nervous system effects or balance issues  She will have Reclast every 2 years for osteopenia  Follow-up in  4 months     Livier Hendel 02/12/2022, 12:31 PM

## 2022-02-11 NOTE — Patient Instructions (Signed)
Walk daily  Check about low sodium with PCP

## 2022-02-12 ENCOUNTER — Other Ambulatory Visit: Payer: Self-pay | Admitting: Cardiovascular Disease

## 2022-02-14 ENCOUNTER — Ambulatory Visit: Payer: Medicare Other | Admitting: Endocrinology

## 2022-02-21 DIAGNOSIS — K219 Gastro-esophageal reflux disease without esophagitis: Secondary | ICD-10-CM | POA: Diagnosis not present

## 2022-02-21 DIAGNOSIS — E871 Hypo-osmolality and hyponatremia: Secondary | ICD-10-CM | POA: Diagnosis not present

## 2022-02-21 DIAGNOSIS — I2510 Atherosclerotic heart disease of native coronary artery without angina pectoris: Secondary | ICD-10-CM | POA: Diagnosis not present

## 2022-02-21 DIAGNOSIS — E782 Mixed hyperlipidemia: Secondary | ICD-10-CM | POA: Diagnosis not present

## 2022-02-21 DIAGNOSIS — I10 Essential (primary) hypertension: Secondary | ICD-10-CM | POA: Diagnosis not present

## 2022-03-07 DIAGNOSIS — H353122 Nonexudative age-related macular degeneration, left eye, intermediate dry stage: Secondary | ICD-10-CM | POA: Diagnosis not present

## 2022-03-19 DIAGNOSIS — E871 Hypo-osmolality and hyponatremia: Secondary | ICD-10-CM | POA: Diagnosis not present

## 2022-04-06 DIAGNOSIS — H353122 Nonexudative age-related macular degeneration, left eye, intermediate dry stage: Secondary | ICD-10-CM | POA: Diagnosis not present

## 2022-04-14 DIAGNOSIS — L538 Other specified erythematous conditions: Secondary | ICD-10-CM | POA: Diagnosis not present

## 2022-04-14 DIAGNOSIS — L820 Inflamed seborrheic keratosis: Secondary | ICD-10-CM | POA: Diagnosis not present

## 2022-04-14 DIAGNOSIS — L218 Other seborrheic dermatitis: Secondary | ICD-10-CM | POA: Diagnosis not present

## 2022-04-14 DIAGNOSIS — L298 Other pruritus: Secondary | ICD-10-CM | POA: Diagnosis not present

## 2022-04-21 DIAGNOSIS — E039 Hypothyroidism, unspecified: Secondary | ICD-10-CM | POA: Diagnosis not present

## 2022-04-21 DIAGNOSIS — I10 Essential (primary) hypertension: Secondary | ICD-10-CM | POA: Diagnosis not present

## 2022-04-21 DIAGNOSIS — M19071 Primary osteoarthritis, right ankle and foot: Secondary | ICD-10-CM | POA: Diagnosis not present

## 2022-04-21 DIAGNOSIS — R5383 Other fatigue: Secondary | ICD-10-CM | POA: Diagnosis not present

## 2022-04-22 ENCOUNTER — Other Ambulatory Visit: Payer: Self-pay

## 2022-04-22 MED ORDER — LEVOTHYROXINE SODIUM 25 MCG PO TABS: ORAL_TABLET | ORAL | 1 refills | Status: DC

## 2022-04-29 ENCOUNTER — Encounter (INDEPENDENT_AMBULATORY_CARE_PROVIDER_SITE_OTHER): Payer: Medicare Other | Admitting: Ophthalmology

## 2022-04-29 ENCOUNTER — Encounter (INDEPENDENT_AMBULATORY_CARE_PROVIDER_SITE_OTHER): Payer: Self-pay

## 2022-04-29 DIAGNOSIS — M6281 Muscle weakness (generalized): Secondary | ICD-10-CM | POA: Diagnosis not present

## 2022-04-29 DIAGNOSIS — M19071 Primary osteoarthritis, right ankle and foot: Secondary | ICD-10-CM | POA: Diagnosis not present

## 2022-05-01 DIAGNOSIS — H524 Presbyopia: Secondary | ICD-10-CM | POA: Diagnosis not present

## 2022-05-01 DIAGNOSIS — H25813 Combined forms of age-related cataract, bilateral: Secondary | ICD-10-CM | POA: Diagnosis not present

## 2022-05-01 DIAGNOSIS — H04123 Dry eye syndrome of bilateral lacrimal glands: Secondary | ICD-10-CM | POA: Diagnosis not present

## 2022-05-01 DIAGNOSIS — H353212 Exudative age-related macular degeneration, right eye, with inactive choroidal neovascularization: Secondary | ICD-10-CM | POA: Diagnosis not present

## 2022-05-01 DIAGNOSIS — H353122 Nonexudative age-related macular degeneration, left eye, intermediate dry stage: Secondary | ICD-10-CM | POA: Diagnosis not present

## 2022-05-05 DIAGNOSIS — H2513 Age-related nuclear cataract, bilateral: Secondary | ICD-10-CM | POA: Diagnosis not present

## 2022-05-05 DIAGNOSIS — H43391 Other vitreous opacities, right eye: Secondary | ICD-10-CM | POA: Diagnosis not present

## 2022-05-05 DIAGNOSIS — H353132 Nonexudative age-related macular degeneration, bilateral, intermediate dry stage: Secondary | ICD-10-CM | POA: Diagnosis not present

## 2022-05-05 DIAGNOSIS — H353211 Exudative age-related macular degeneration, right eye, with active choroidal neovascularization: Secondary | ICD-10-CM | POA: Diagnosis not present

## 2022-05-06 DIAGNOSIS — H353122 Nonexudative age-related macular degeneration, left eye, intermediate dry stage: Secondary | ICD-10-CM | POA: Diagnosis not present

## 2022-05-07 DIAGNOSIS — M6281 Muscle weakness (generalized): Secondary | ICD-10-CM | POA: Diagnosis not present

## 2022-05-07 DIAGNOSIS — M19071 Primary osteoarthritis, right ankle and foot: Secondary | ICD-10-CM | POA: Diagnosis not present

## 2022-05-15 DIAGNOSIS — M19071 Primary osteoarthritis, right ankle and foot: Secondary | ICD-10-CM | POA: Diagnosis not present

## 2022-05-15 DIAGNOSIS — M6281 Muscle weakness (generalized): Secondary | ICD-10-CM | POA: Diagnosis not present

## 2022-05-26 ENCOUNTER — Other Ambulatory Visit: Payer: Self-pay | Admitting: Physician Assistant

## 2022-05-26 ENCOUNTER — Ambulatory Visit
Admission: RE | Admit: 2022-05-26 | Discharge: 2022-05-26 | Disposition: A | Discharge status: Home / Self Care | Payer: Medicare Other | Source: Ambulatory Visit | Attending: Physician Assistant | Admitting: Physician Assistant

## 2022-05-26 DIAGNOSIS — R079 Chest pain, unspecified: Secondary | ICD-10-CM | POA: Diagnosis not present

## 2022-05-26 DIAGNOSIS — R0781 Pleurodynia: Secondary | ICD-10-CM

## 2022-05-26 DIAGNOSIS — M898X1 Other specified disorders of bone, shoulder: Secondary | ICD-10-CM | POA: Diagnosis not present

## 2022-06-02 DIAGNOSIS — M19071 Primary osteoarthritis, right ankle and foot: Secondary | ICD-10-CM | POA: Diagnosis not present

## 2022-06-02 DIAGNOSIS — M6281 Muscle weakness (generalized): Secondary | ICD-10-CM | POA: Diagnosis not present

## 2022-06-04 DIAGNOSIS — M542 Cervicalgia: Secondary | ICD-10-CM | POA: Diagnosis not present

## 2022-06-05 DIAGNOSIS — H353122 Nonexudative age-related macular degeneration, left eye, intermediate dry stage: Secondary | ICD-10-CM | POA: Diagnosis not present

## 2022-06-06 DIAGNOSIS — R7989 Other specified abnormal findings of blood chemistry: Secondary | ICD-10-CM | POA: Diagnosis not present

## 2022-06-06 DIAGNOSIS — E871 Hypo-osmolality and hyponatremia: Secondary | ICD-10-CM | POA: Diagnosis not present

## 2022-06-06 DIAGNOSIS — H353211 Exudative age-related macular degeneration, right eye, with active choroidal neovascularization: Secondary | ICD-10-CM | POA: Diagnosis not present

## 2022-06-06 DIAGNOSIS — H6091 Unspecified otitis externa, right ear: Secondary | ICD-10-CM | POA: Diagnosis not present

## 2022-06-06 DIAGNOSIS — E559 Vitamin D deficiency, unspecified: Secondary | ICD-10-CM | POA: Diagnosis not present

## 2022-06-06 DIAGNOSIS — I10 Essential (primary) hypertension: Secondary | ICD-10-CM | POA: Diagnosis not present

## 2022-06-06 DIAGNOSIS — R7303 Prediabetes: Secondary | ICD-10-CM | POA: Diagnosis not present

## 2022-06-06 DIAGNOSIS — Z0000 Encounter for general adult medical examination without abnormal findings: Secondary | ICD-10-CM | POA: Diagnosis not present

## 2022-06-06 DIAGNOSIS — I700 Atherosclerosis of aorta: Secondary | ICD-10-CM | POA: Diagnosis not present

## 2022-06-06 DIAGNOSIS — E782 Mixed hyperlipidemia: Secondary | ICD-10-CM | POA: Diagnosis not present

## 2022-06-06 DIAGNOSIS — I2510 Atherosclerotic heart disease of native coronary artery without angina pectoris: Secondary | ICD-10-CM | POA: Diagnosis not present

## 2022-06-09 ENCOUNTER — Other Ambulatory Visit (HOSPITAL_COMMUNITY): Payer: Self-pay

## 2022-06-09 ENCOUNTER — Encounter: Payer: Self-pay | Admitting: Endocrinology

## 2022-06-11 DIAGNOSIS — M19071 Primary osteoarthritis, right ankle and foot: Secondary | ICD-10-CM | POA: Diagnosis not present

## 2022-06-11 DIAGNOSIS — M6281 Muscle weakness (generalized): Secondary | ICD-10-CM | POA: Diagnosis not present

## 2022-06-13 DIAGNOSIS — H52223 Regular astigmatism, bilateral: Secondary | ICD-10-CM | POA: Diagnosis not present

## 2022-06-13 DIAGNOSIS — H353132 Nonexudative age-related macular degeneration, bilateral, intermediate dry stage: Secondary | ICD-10-CM | POA: Diagnosis not present

## 2022-06-13 DIAGNOSIS — H2513 Age-related nuclear cataract, bilateral: Secondary | ICD-10-CM | POA: Diagnosis not present

## 2022-06-13 DIAGNOSIS — H5203 Hypermetropia, bilateral: Secondary | ICD-10-CM | POA: Diagnosis not present

## 2022-06-13 DIAGNOSIS — H524 Presbyopia: Secondary | ICD-10-CM | POA: Diagnosis not present

## 2022-06-13 DIAGNOSIS — R7303 Prediabetes: Secondary | ICD-10-CM | POA: Diagnosis not present

## 2022-06-16 ENCOUNTER — Other Ambulatory Visit (INDEPENDENT_AMBULATORY_CARE_PROVIDER_SITE_OTHER): Payer: Medicare Other

## 2022-06-16 ENCOUNTER — Other Ambulatory Visit: Payer: Self-pay | Admitting: Cardiovascular Disease

## 2022-06-16 DIAGNOSIS — E039 Hypothyroidism, unspecified: Secondary | ICD-10-CM | POA: Diagnosis not present

## 2022-06-16 DIAGNOSIS — R7303 Prediabetes: Secondary | ICD-10-CM

## 2022-06-16 LAB — BASIC METABOLIC PANEL
BUN: 15 mg/dL (ref 6–23)
CO2: 26 mEq/L (ref 19–32)
Calcium: 9.7 mg/dL (ref 8.4–10.5)
Chloride: 99 mEq/L (ref 96–112)
Creatinine, Ser: 0.62 mg/dL (ref 0.40–1.20)
GFR: 84.72 mL/min (ref >60.00)
Glucose, Bld: 133 mg/dL — ABNORMAL HIGH (ref 70–99)
Potassium: 4.4 mEq/L (ref 3.5–5.1)
Sodium: 132 mEq/L — ABNORMAL LOW (ref 135–145)

## 2022-06-16 LAB — HEMOGLOBIN A1C: Hgb A1c MFr Bld: 6.8 % — ABNORMAL HIGH (ref 4.6–6.5)

## 2022-06-16 LAB — TSH: TSH: 2.71 u[IU]/mL (ref 0.35–5.50)

## 2022-06-17 DIAGNOSIS — D649 Anemia, unspecified: Secondary | ICD-10-CM | POA: Diagnosis not present

## 2022-06-17 DIAGNOSIS — M5412 Radiculopathy, cervical region: Secondary | ICD-10-CM | POA: Diagnosis not present

## 2022-06-17 DIAGNOSIS — I10 Essential (primary) hypertension: Secondary | ICD-10-CM | POA: Diagnosis not present

## 2022-06-17 DIAGNOSIS — M25511 Pain in right shoulder: Secondary | ICD-10-CM | POA: Diagnosis not present

## 2022-06-17 DIAGNOSIS — E039 Hypothyroidism, unspecified: Secondary | ICD-10-CM | POA: Diagnosis not present

## 2022-06-17 DIAGNOSIS — K219 Gastro-esophageal reflux disease without esophagitis: Secondary | ICD-10-CM | POA: Diagnosis not present

## 2022-06-17 DIAGNOSIS — E782 Mixed hyperlipidemia: Secondary | ICD-10-CM | POA: Diagnosis not present

## 2022-06-17 DIAGNOSIS — I2510 Atherosclerotic heart disease of native coronary artery without angina pectoris: Secondary | ICD-10-CM | POA: Diagnosis not present

## 2022-06-17 DIAGNOSIS — M6281 Muscle weakness (generalized): Secondary | ICD-10-CM | POA: Diagnosis not present

## 2022-06-18 ENCOUNTER — Ambulatory Visit: Payer: Medicare Other | Admitting: Endocrinology

## 2022-06-18 ENCOUNTER — Encounter: Payer: Self-pay | Admitting: Endocrinology

## 2022-06-18 VITALS — BP 120/78 | HR 82 | Ht 61.0 in | Wt 133.0 lb

## 2022-06-18 DIAGNOSIS — R7303 Prediabetes: Secondary | ICD-10-CM

## 2022-06-18 DIAGNOSIS — E871 Hypo-osmolality and hyponatremia: Secondary | ICD-10-CM | POA: Diagnosis not present

## 2022-06-18 DIAGNOSIS — Z780 Asymptomatic menopausal state: Secondary | ICD-10-CM

## 2022-06-18 DIAGNOSIS — M8580 Other specified disorders of bone density and structure, unspecified site: Secondary | ICD-10-CM | POA: Diagnosis not present

## 2022-06-18 DIAGNOSIS — E039 Hypothyroidism, unspecified: Secondary | ICD-10-CM

## 2022-06-18 NOTE — Progress Notes (Unsigned)
Patient ID: Brittany Figueroa, female   DOB: 1940/05/29, 82 y.o.   MRN: [id number]           Chief complaint: Endocrinology follow-up    History of Present Illness:  PREVIOUS history: She may have been told about 10 years ago that she had prediabetes and reportedly had a normal glucose tolerance test. She was told by her PCP that she has type 2 diabetes based on an A1c of 6.5% in 07/2013 She has not had any other A1c is subsequently above normal, normal range and previous lab 6.2 or less She also has not had any abnormal blood sugars except a glucose of 125 in 3/16  Has history of increased fasting blood sugars ranging from 108-125 since about 2012  GLUCOSE TOLERANCE test on 05/23/15: Fasting glucose 110, two-hour glucose 192  RECENT history:   She has impaired fasting glucose and glucose intolerance  She has been intermittently on metformin ER since about 08/2015 In 1/18 when A1c was 6.5 and glucose 133 fasting she was told to start back on metformin and also improve her diet She was taking 1500 mg a day but could not tolerate this because of feeling of bloating  Current regimen: Metformin ER 500 mg twice daily   A1c is 6.8, previously 6.4  Current blood sugar readings, management and history    She is checking her blood sugars once in a while and mostly in the mornings Although she had lab glucose of 174 earlier this month this was on prednisone Highest reading at home in the morning is 141 She is off steroids for a few days now because of high readings and blood sugars are better She is tolerating metformin without GI side effects and renal function is normal She does try to watch her diet with healthy meals Currently is able to start walking a little bit improved leg and foot pain Weight is stable   Using freestyle neo monitor Blood sugar data for the last month showed range of 107-149 fasting with 4 readings and evening 141  Weight history: Previous range 139-143  Wt  Readings from Last 3 Encounters:  06/18/22 133 lb (60.3 kg)  11/15/21 133 lb (60.3 kg)  10/31/21 132 lb (59.9 kg)    Lab Results  Component Value Date   HGBA1C 6.8 (H) 06/16/2022   HGBA1C 6.4 02/05/2022   HGBA1C 6.1 10/07/2021   Lab Results  Component Value Date   MICROALBUR <0.7 07/17/2020   LDLCALC 67 10/07/2021   CREATININE 0.62 06/16/2022   HYPOTHYROIDISM and other problems reviewed today: See review of systems    Lab on 06/16/2022  Component Date Value Ref Range Status   TSH 06/16/2022 2.71  0.35 - 5.50 uIU/mL Final   Sodium 06/16/2022 132 (L)  135 - 145 mEq/L Final   Potassium 06/16/2022 4.4  3.5 - 5.1 mEq/L Final   Chloride 06/16/2022 99  96 - 112 mEq/L Final   CO2 06/16/2022 26  19 - 32 mEq/L Final   Glucose, Bld 06/16/2022 133 (H)  70 - 99 mg/dL Final   BUN 06/16/2022 15  6 - 23 mg/dL Final   Creatinine, Ser 06/16/2022 0.62  0.40 - 1.20 mg/dL Final   GFR 06/16/2022 84.72  >60.00 mL/min Final   Calculated using the CKD-EPI Creatinine Equation (2021)   Calcium 06/16/2022 9.7  8.4 - 10.5 mg/dL Final   Hgb A1c MFr Bld 06/16/2022 6.8 (H)  4.6 - 6.5 % Final   Glycemic Control Guidelines for  People with Diabetes:Non Diabetic:  <6%Goal of Therapy: <7%Additional Action Suggested:  >8%       Past Medical History:  Diagnosis Date   Abnormal finding on EKG, new anterolateral T-wave inversions  05/14/2012   Abnormal nuclear stress test, with infero-lateral ischemia 05/14/2012   Arthritis    "right thumb; all my fingers" (05/13/2012), cervical spondylosis    Atypical angina 05/14/2012   CAD (coronary artery disease), 05/13/12, with 80% LAD 05/14/2012   Cancer ([place])    basal cell on facial in L temporal region     [facility] arthritis, thumb, degenerative    Complication of anesthesia 12/2009   "had endoscopy; larynx went into spasms; stopped breathing for 15 seconds" (05/13/2012)   GERD (gastroesophageal reflux disease) 2011   Hypercholesteremia    Hypertension    S/P  angioplasty with stent, LAD 05/13/12 05/14/2012   Tuberculosis    test +- [place] [facility] - Dr. [name]- CXR- OK    Past Surgical History:  Procedure Laterality Date   ANTERIOR CERVICAL DECOMP/DISCECTOMY FUSION N/A 12/13/2013   Procedure: ANTERIOR CERVICAL DECOMPRESSION/DISCECTOMY FUSION 3 LEVELS Cervical four/five,five/six,six/seven. Anterior cervical disectomy and fusion with peek and plate ;  Surgeon: [name], MD;  Location: [facility];  Service: Neurosurgery;  Laterality: N/A;   CARDIAC CATHETERIZATION N/A 07/02/2015   Procedure: Left Heart Cath and Coronary Angiography;  Surgeon: [name], MD;  Location: [facility];  Service: Cardiovascular;  Laterality: N/A;   CORONARY ANGIOPLASTY WITH STENT PLACEMENT  05/13/2012   DES to LAD; she has total RCA with left to rt coll. normal LV function done for positive nuc study   DILATION AND CURETTAGE OF UTERUS  1960's; 1970's; 1980   "probably 3" (05/13/2012)   FOREARM / WRIST TENDON LESION EXCISION  ?11/2001   "right; did waver to try to get ulnar out of hole that it had cut" (05/13/2012   INGUINAL HERNIA REPAIR  ~ 1966; 07/21/2002   "right; left" (05/13/2012)   LEFT HEART CATHETERIZATION WITH CORONARY ANGIOGRAM N/A 05/13/2012   Procedure: LEFT HEART CATHETERIZATION WITH CORONARY ANGIOGRAM;  Surgeon: [name], MD;  Location: [facility] CATH LAB;  Service: Cardiovascular;  Laterality: N/A;   OSTEOTOMY AND ULNAR SHORTENING  07/21/2002   "right" (05/13/2012)   TONSILLECTOMY AND ADENOIDECTOMY  1950's   TUBAL LIGATION  1980    Family History  Problem Relation Age of Onset   Hypertension Mother    Diabetes Mother    Hyperlipidemia Father    Heart disease Father    Stroke Father    Heart disease Sister    Diabetes Sister    Heart disease Brother    Diabetes Maternal Aunt     Social History:  reports that she quit smoking about 33 years ago. Her smoking use included cigarettes. She has a 10.00 pack-year smoking history. She has  never used smokeless tobacco. She reports current alcohol use of about 12.0 standard drinks of alcohol per week. She reports that she does not use drugs.  Allergies:  Allergies  Allergen Reactions   Statins Other (See Comments)    "intermittent loss of circulation; hands and arms will go to sleep; feet will get cramps; fatigue" (05/13/2012).  This occurred with Lipitor, Zocor, Crestor 5 mg qd, and she thinks pravastatin as well   Amlodipine Itching   Gadolinium Derivatives Itching and Nausea Only    Pt had severe nausea and itching on legs, neck and back, per dr [name] pt needs 13 hour prep  before contrast in the future    Allergies as of 06/18/2022       Reactions   Statins Other (See Comments)   "intermittent loss of circulation; hands and arms will go to sleep; feet will get cramps; fatigue" (05/13/2012).  This occurred with Lipitor, Zocor, Crestor 5 mg qd, and she thinks pravastatin as well   Amlodipine Itching   Gadolinium Derivatives Itching, Nausea Only   Pt had severe nausea and itching on legs, neck and back, per dr [name] pt needs 13 hour prep before contrast in the future        Medication List        Accurate as of June 18, 2022 10:54 AM. If you have any questions, ask your nurse or doctor.          acetaminophen 500 MG tablet Commonly known as: TYLENOL Take 500 mg by mouth every 6 (six) hours as needed for mild pain or headache.   ASPIRIN 81 PO 1 tablet   aspirin 81 MG tablet Take 81 mg by mouth daily.   calcium-vitamin D 500-200 MG-UNIT tablet Commonly known as: OSCAL WITH D Take 1 tablet by mouth daily as needed.   carvedilol 12.5 MG tablet Commonly known as: COREG Take 12.5 mg by mouth 2 (two) times daily with a meal.   clopidogrel 75 MG tablet Commonly known as: PLAVIX TAKE 1 TABLET(75 MG) BY MOUTH DAILY   diclofenac sodium 1 % Gel Commonly known as: VOLTAREN Apply 2 g topically daily as needed. For pain   diltiazem 240 MG 24 hr  capsule Commonly known as: DILACOR XR TAKE 1 CAPSULE   Dilt-XR 240 MG 24 hr capsule Generic drug: diltiazem Take 240 mg by mouth daily.   ezetimibe 10 MG tablet Commonly known as: ZETIA TAKE 1 TABLET(10 MG) BY MOUTH DAILY   fenofibrate 160 MG tablet TAKE 1 TABLET(160 MG) BY MOUTH DAILY   glucose blood test strip Commonly known as: OneTouch Verio Use to test blood sugar once daily Dx code E11.9   irbesartan 300 MG tablet Commonly known as: AVAPRO TAKE 1/2 TABLET BY MOUTH TWICE DAILY IN THE MORNING AND AT BEDTIME   levothyroxine 25 MCG tablet Commonly known as: SYNTHROID TAKE 1 AND 1/2 TABLETS BY MOUTH DAILY BEFORE BREAKFAST   metFORMIN 500 MG 24 hr tablet Commonly known as: GLUCOPHAGE-XR TAKE 2 TABLETS BY MOUTH DAILY WITH SUPPER   multivitamin with minerals Tabs tablet Take 1 tablet by mouth daily.   nitroGLYCERIN 0.4 MG SL tablet Commonly known as: NITROSTAT PLACE 1 TABLET UNDER THE TONGUE EVERY 5 MINUTES AS NEEDED FOR CHEST PAIN   omega-3 acid ethyl esters 1 g capsule Commonly known as: LOVAZA Take 2 capsules (2 g total) by mouth 2 (two) times daily.   PRESERVISION AREDS 2+MULTI VIT PO Take 1 tablet by mouth in the morning and at bedtime.   Repatha SureClick 161 MG/ML [place] Generic drug: Evolocumab INJECT 1 PEN UNDER THE SKIN EVERY 14 DAYS   triamterene-hydrochlorothiazide 37.5-25 MG tablet Commonly known as: MAXZIDE-25 Take 1 tablet by mouth daily. Patient taking  1/4 tablet   Vitamin D3 50 MCG (2000 UT) Tabs Take 2,000 Units by mouth 2 (two) times daily.        LABS:  Lab on 06/16/2022  Component Date Value Ref Range Status   TSH 06/16/2022 2.71  0.35 - 5.50 uIU/mL Final   Sodium 06/16/2022 132 (L)  135 - 145 mEq/L Final   Potassium 06/16/2022 4.4  3.5 -  5.1 mEq/L Final   Chloride 06/16/2022 99  96 - 112 mEq/L Final   CO2 06/16/2022 26  19 - 32 mEq/L Final   Glucose, Bld 06/16/2022 133 (H)  70 - 99 mg/dL Final   BUN 06/16/2022 15  6 - 23 mg/dL  Final   Creatinine, Ser 06/16/2022 0.62  0.40 - 1.20 mg/dL Final   GFR 06/16/2022 84.72  >60.00 mL/min Final   Calculated using the CKD-EPI Creatinine Equation (2021)   Calcium 06/16/2022 9.7  8.4 - 10.5 mg/dL Final   Hgb A1c MFr Bld 06/16/2022 6.8 (H)  4.6 - 6.5 % Final   Glycemic Control Guidelines for People with Diabetes:Non Diabetic:  <6%Goal of Therapy: <7%Additional Action Suggested:  >8%       Review of Systems    OSTEOPENIA:   Lowest T score is at the right neck femur previously -2.4  This is only 0.8 as of 11/14/2019  However BMD at the left femoral neck is last -2.0 compared to -1.8  Apparently had tried Fosamax in the past and this caused some GI side effects She received RECLAST infusions on 12/15/17, 01/07/2020 and also 6/23   Vitamin D deficiency: She is taking OTC vitamin D 4000 U daily  Last vitamin D level is normal  HYPERTENSION: Treated with Avapro, diltiazem,  and Coreg, now off triamterene HCTZ This is followed by PCP.  Home BP has been checked regularly  HYPONATREMIA: This was felt to be from HCTZ but was worse when she was on steroids and sodium was 129, now improving at 132  HYPERLIPIDEMIA: Followed by cardiology History of hyperlipidemia with some intolerance to statins, on Repatha and also on Zetia.   Has high triglycerides, with the following triglycerides   Last fasting lipids as follows  Lab Results  Component Value Date   CHOL 155 10/07/2021   CHOL 162 10/29/2020   CHOL 169 08/22/2020   Lab Results  Component Value Date   HDL 48.40 10/07/2021   HDL 47 10/29/2020   HDL 47 08/22/2020   Lab Results  Component Value Date   LDLCALC 67 10/07/2021   LDLCALC 76 10/29/2020   LDLCALC 83 08/22/2020   Lab Results  Component Value Date   TRIG 200.0 (H) 10/07/2021   TRIG 239 (H) 10/29/2020   TRIG 236 (H) 08/22/2020   Lab Results  Component Value Date   CHOLHDL 3 10/07/2021   CHOLHDL 3.4 10/29/2020   CHOLHDL 3.6 08/22/2020   Lab  Results  Component Value Date   LDLDIRECT 130.1 04/04/2014       HYPOTHYROIDISM: TSH level previously has been as high as 5.6  This spontaneously improved but in 05/2018 was mildly increased again  At that time she was complaining of some fatigue, constipation, dry skin, some cold intolerance and difficulty losing weight.  She is taking levothyroxine 37.5 mcg daily, she did have some improvement in her fatigue with the higher dose Currently however she is still complaining of some fatigue  Her TSH is usually quite normal  Lab Results  Component Value Date   TSH 2.71 06/16/2022   TSH 3.66 02/05/2022   TSH 3.05 06/03/2021   FREET4 1.15 02/05/2022   FREET4 1.29 06/03/2021   FREET4 1.16 02/25/2021   CALCIUM levels: Usually has quite normal calcium levels usually below 10 and now is unusually high at 11.4 Has not had any excessive vitamin D usage  Lab Results  Component Value Date   CALCIUM 9.7 06/16/2022     PHYSICAL  EXAM:  BP 120/78 (BP Location: Left Arm, Patient Position: Sitting, Cuff Size: Normal)   Pulse 82   Ht '5\' 1"'$  (1.549 m)   Wt 133 lb (60.3 kg)   SpO2 98%   BMI 25.13 kg/m      ASSESSMENT:   Prediabetes with impaired fasting glucose and also impaired glucose tolerance  Most of her A1c results and blood sugars in the past have been in the prediabetic range  Again she has had high blood sugars recently and A1c up to 6.8 likely from steroids Most recent fasting glucose at home 112  She is on 500 mg of Metformin twice a day  Weight is stable  HYPOTHYROIDISM: Stable   Mild hyponatremia: Again stable and asymptomatic, now 132  OSTEOPENIA: Has had 3 infusions of Reclast with last bone density improved  PLAN:   She will continue metformin 500 mg twice daily She will start to check blood sugars more consistently including after meals and let us know if they are starting to increase She will try to increase her walking as tolerated  She will continue  1-1/2 tablets of 25 mcg levothyroxine for her mild hypothyroidism  Continue to follow sodium levels  She will have Reclast every 2 years for osteopenia  Bone density to be scheduled   Follow-up in  4 months     Frimet Durfee 06/18/2022, 10:54 AM

## 2022-06-18 NOTE — Patient Instructions (Addendum)
Check blood sugars on waking up 2-3 days a week  Also check blood sugars about 2 hours after meals and do this after different meals by rotation  Recommended blood sugar levels on waking up are 90-130 and about 2 hours after meal is 130-160

## 2022-06-24 DIAGNOSIS — H43813 Vitreous degeneration, bilateral: Secondary | ICD-10-CM | POA: Diagnosis not present

## 2022-06-24 DIAGNOSIS — H35033 Hypertensive retinopathy, bilateral: Secondary | ICD-10-CM | POA: Diagnosis not present

## 2022-06-24 DIAGNOSIS — H353122 Nonexudative age-related macular degeneration, left eye, intermediate dry stage: Secondary | ICD-10-CM | POA: Diagnosis not present

## 2022-06-24 DIAGNOSIS — H353211 Exudative age-related macular degeneration, right eye, with active choroidal neovascularization: Secondary | ICD-10-CM | POA: Diagnosis not present

## 2022-06-24 DIAGNOSIS — H2513 Age-related nuclear cataract, bilateral: Secondary | ICD-10-CM | POA: Diagnosis not present

## 2022-06-26 DIAGNOSIS — M6281 Muscle weakness (generalized): Secondary | ICD-10-CM | POA: Diagnosis not present

## 2022-06-26 DIAGNOSIS — M5412 Radiculopathy, cervical region: Secondary | ICD-10-CM | POA: Diagnosis not present

## 2022-06-26 DIAGNOSIS — M25511 Pain in right shoulder: Secondary | ICD-10-CM | POA: Diagnosis not present

## 2022-07-02 DIAGNOSIS — M25511 Pain in right shoulder: Secondary | ICD-10-CM | POA: Diagnosis not present

## 2022-07-03 DIAGNOSIS — I700 Atherosclerosis of aorta: Secondary | ICD-10-CM | POA: Diagnosis not present

## 2022-07-03 DIAGNOSIS — I25119 Atherosclerotic heart disease of native coronary artery with unspecified angina pectoris: Secondary | ICD-10-CM | POA: Diagnosis not present

## 2022-07-03 DIAGNOSIS — I10 Essential (primary) hypertension: Secondary | ICD-10-CM | POA: Diagnosis not present

## 2022-07-05 DIAGNOSIS — H353122 Nonexudative age-related macular degeneration, left eye, intermediate dry stage: Secondary | ICD-10-CM | POA: Diagnosis not present

## 2022-07-07 DIAGNOSIS — M6281 Muscle weakness (generalized): Secondary | ICD-10-CM | POA: Diagnosis not present

## 2022-07-07 DIAGNOSIS — M19071 Primary osteoarthritis, right ankle and foot: Secondary | ICD-10-CM | POA: Diagnosis not present

## 2022-07-11 ENCOUNTER — Other Ambulatory Visit: Payer: Self-pay | Admitting: Cardiovascular Disease

## 2022-07-17 DIAGNOSIS — M6281 Muscle weakness (generalized): Secondary | ICD-10-CM | POA: Diagnosis not present

## 2022-07-17 DIAGNOSIS — M25511 Pain in right shoulder: Secondary | ICD-10-CM | POA: Diagnosis not present

## 2022-07-17 DIAGNOSIS — M5412 Radiculopathy, cervical region: Secondary | ICD-10-CM | POA: Diagnosis not present

## 2022-07-20 ENCOUNTER — Other Ambulatory Visit: Payer: Self-pay | Admitting: Endocrinology

## 2022-07-21 ENCOUNTER — Telehealth: Payer: Self-pay

## 2022-07-21 NOTE — Telephone Encounter (Signed)
Pt called regarding rx for Levothyroxine refill needed. Rx sent. Pt advised she has not heard back regarding her Bone Density Scan being scheduled. Unsure of where it was sent and who to follow up with for scheduling.

## 2022-07-22 NOTE — Telephone Encounter (Signed)
Patient given number to schedule bone density at [phone].

## 2022-07-28 ENCOUNTER — Other Ambulatory Visit: Payer: Self-pay

## 2022-07-28 DIAGNOSIS — R7303 Prediabetes: Secondary | ICD-10-CM

## 2022-07-28 MED ORDER — METFORMIN HCL ER 500 MG PO TB24: ORAL_TABLET | ORAL | 3 refills | Status: DC

## 2022-08-04 DIAGNOSIS — H353122 Nonexudative age-related macular degeneration, left eye, intermediate dry stage: Secondary | ICD-10-CM | POA: Diagnosis not present

## 2022-08-05 DIAGNOSIS — H353211 Exudative age-related macular degeneration, right eye, with active choroidal neovascularization: Secondary | ICD-10-CM | POA: Diagnosis not present

## 2022-08-07 DIAGNOSIS — Z08 Encounter for follow-up examination after completed treatment for malignant neoplasm: Secondary | ICD-10-CM | POA: Diagnosis not present

## 2022-08-07 DIAGNOSIS — L820 Inflamed seborrheic keratosis: Secondary | ICD-10-CM | POA: Diagnosis not present

## 2022-08-07 DIAGNOSIS — D225 Melanocytic nevi of trunk: Secondary | ICD-10-CM | POA: Diagnosis not present

## 2022-08-07 DIAGNOSIS — L814 Other melanin hyperpigmentation: Secondary | ICD-10-CM | POA: Diagnosis not present

## 2022-08-07 DIAGNOSIS — L821 Other seborrheic keratosis: Secondary | ICD-10-CM | POA: Diagnosis not present

## 2022-08-07 DIAGNOSIS — Z85828 Personal history of other malignant neoplasm of skin: Secondary | ICD-10-CM | POA: Diagnosis not present

## 2022-08-08 DIAGNOSIS — M19071 Primary osteoarthritis, right ankle and foot: Secondary | ICD-10-CM | POA: Diagnosis not present

## 2022-08-08 DIAGNOSIS — M6281 Muscle weakness (generalized): Secondary | ICD-10-CM | POA: Diagnosis not present

## 2022-08-12 ENCOUNTER — Encounter: Payer: Self-pay | Admitting: Pharmacist

## 2022-08-12 ENCOUNTER — Telehealth: Payer: Self-pay | Admitting: Cardiovascular Disease

## 2022-08-12 ENCOUNTER — Other Ambulatory Visit: Payer: Self-pay | Admitting: Cardiovascular Disease

## 2022-08-12 NOTE — Telephone Encounter (Signed)
Looks like pt's grant expired yesterday. I have re-enrolled her in the [employer] another year for her cholesterol meds and sent updated grant info to her via [employer]. Called pt to advise her, no answer, left detailed message.

## 2022-08-12 NOTE — Telephone Encounter (Signed)
Pt c/o medication issue:  1. Name of Medication: REPATHA SURECLICK [id number] MG/ML SOAJ   2. How are you currently taking this medication (dosage and times per day)?   3. Are you having a reaction (difficulty breathing--STAT)?   4. What is your medication issue? Patient is requesting call back to discuss grant given for this medication. She states she needs to apply for another since her previous one has run out. Please advise.

## 2022-08-13 DIAGNOSIS — M542 Cervicalgia: Secondary | ICD-10-CM | POA: Diagnosis not present

## 2022-08-14 ENCOUNTER — Ambulatory Visit (INDEPENDENT_AMBULATORY_CARE_PROVIDER_SITE_OTHER)
Admission: RE | Admit: 2022-08-14 | Discharge: 2022-08-14 | Disposition: A | Discharge status: Home / Self Care | Payer: Medicare Other | Source: Ambulatory Visit | Attending: Endocrinology | Admitting: Endocrinology

## 2022-08-14 DIAGNOSIS — Z780 Asymptomatic menopausal state: Secondary | ICD-10-CM

## 2022-08-24 DIAGNOSIS — I444 Left anterior fascicular block: Secondary | ICD-10-CM | POA: Diagnosis not present

## 2022-08-24 DIAGNOSIS — R0602 Shortness of breath: Secondary | ICD-10-CM | POA: Diagnosis not present

## 2022-08-24 DIAGNOSIS — K219 Gastro-esophageal reflux disease without esophagitis: Secondary | ICD-10-CM | POA: Diagnosis not present

## 2022-08-24 DIAGNOSIS — K5900 Constipation, unspecified: Secondary | ICD-10-CM | POA: Diagnosis not present

## 2022-08-24 DIAGNOSIS — R1084 Generalized abdominal pain: Secondary | ICD-10-CM | POA: Diagnosis not present

## 2022-08-24 DIAGNOSIS — R030 Elevated blood-pressure reading, without diagnosis of hypertension: Secondary | ICD-10-CM | POA: Diagnosis not present

## 2022-08-25 DIAGNOSIS — K5900 Constipation, unspecified: Secondary | ICD-10-CM | POA: Diagnosis not present

## 2022-08-25 DIAGNOSIS — R10819 Abdominal tenderness, unspecified site: Secondary | ICD-10-CM | POA: Diagnosis not present

## 2022-08-25 DIAGNOSIS — R142 Eructation: Secondary | ICD-10-CM | POA: Diagnosis not present

## 2022-08-26 ENCOUNTER — Other Ambulatory Visit: Payer: Self-pay | Admitting: Internal Medicine

## 2022-08-26 DIAGNOSIS — L298 Other pruritus: Secondary | ICD-10-CM | POA: Diagnosis not present

## 2022-08-26 DIAGNOSIS — L820 Inflamed seborrheic keratosis: Secondary | ICD-10-CM | POA: Diagnosis not present

## 2022-08-26 DIAGNOSIS — K5900 Constipation, unspecified: Secondary | ICD-10-CM | POA: Diagnosis not present

## 2022-08-26 DIAGNOSIS — R142 Eructation: Secondary | ICD-10-CM

## 2022-08-26 DIAGNOSIS — R10819 Abdominal tenderness, unspecified site: Secondary | ICD-10-CM

## 2022-08-26 DIAGNOSIS — K5730 Diverticulosis of large intestine without perforation or abscess without bleeding: Secondary | ICD-10-CM | POA: Diagnosis not present

## 2022-08-30 ENCOUNTER — Other Ambulatory Visit: Payer: Self-pay | Admitting: Cardiovascular Disease

## 2022-09-01 DIAGNOSIS — M5412 Radiculopathy, cervical region: Secondary | ICD-10-CM | POA: Diagnosis not present

## 2022-09-01 DIAGNOSIS — M6281 Muscle weakness (generalized): Secondary | ICD-10-CM | POA: Diagnosis not present

## 2022-09-01 DIAGNOSIS — M25511 Pain in right shoulder: Secondary | ICD-10-CM | POA: Diagnosis not present

## 2022-09-02 NOTE — Progress Notes (Signed)
Please call to let patient know that the bone density is gradually improving and no further action needed

## 2022-09-03 DIAGNOSIS — I10 Essential (primary) hypertension: Secondary | ICD-10-CM | POA: Diagnosis not present

## 2022-09-03 DIAGNOSIS — R1084 Generalized abdominal pain: Secondary | ICD-10-CM | POA: Diagnosis not present

## 2022-09-03 DIAGNOSIS — R7401 Elevation of levels of liver transaminase levels: Secondary | ICD-10-CM | POA: Diagnosis not present

## 2022-09-03 DIAGNOSIS — H353122 Nonexudative age-related macular degeneration, left eye, intermediate dry stage: Secondary | ICD-10-CM | POA: Diagnosis not present

## 2022-09-04 DIAGNOSIS — R1010 Upper abdominal pain, unspecified: Secondary | ICD-10-CM | POA: Diagnosis not present

## 2022-09-04 DIAGNOSIS — R7989 Other specified abnormal findings of blood chemistry: Secondary | ICD-10-CM | POA: Diagnosis not present

## 2022-09-04 DIAGNOSIS — R945 Abnormal results of liver function studies: Secondary | ICD-10-CM | POA: Diagnosis not present

## 2022-09-04 DIAGNOSIS — R030 Elevated blood-pressure reading, without diagnosis of hypertension: Secondary | ICD-10-CM | POA: Diagnosis not present

## 2022-09-04 DIAGNOSIS — D473 Essential (hemorrhagic) thrombocythemia: Secondary | ICD-10-CM | POA: Diagnosis not present

## 2022-09-11 DIAGNOSIS — H04123 Dry eye syndrome of bilateral lacrimal glands: Secondary | ICD-10-CM | POA: Diagnosis not present

## 2022-09-11 DIAGNOSIS — D473 Essential (hemorrhagic) thrombocythemia: Secondary | ICD-10-CM | POA: Diagnosis not present

## 2022-09-11 DIAGNOSIS — H353122 Nonexudative age-related macular degeneration, left eye, intermediate dry stage: Secondary | ICD-10-CM | POA: Diagnosis not present

## 2022-09-11 DIAGNOSIS — R7989 Other specified abnormal findings of blood chemistry: Secondary | ICD-10-CM | POA: Diagnosis not present

## 2022-09-11 DIAGNOSIS — H353212 Exudative age-related macular degeneration, right eye, with inactive choroidal neovascularization: Secondary | ICD-10-CM | POA: Diagnosis not present

## 2022-09-11 DIAGNOSIS — H25813 Combined forms of age-related cataract, bilateral: Secondary | ICD-10-CM | POA: Diagnosis not present

## 2022-09-11 DIAGNOSIS — R1013 Epigastric pain: Secondary | ICD-10-CM | POA: Diagnosis not present

## 2022-09-11 DIAGNOSIS — E871 Hypo-osmolality and hyponatremia: Secondary | ICD-10-CM | POA: Diagnosis not present

## 2022-09-11 DIAGNOSIS — R945 Abnormal results of liver function studies: Secondary | ICD-10-CM | POA: Diagnosis not present

## 2022-09-16 DIAGNOSIS — H353211 Exudative age-related macular degeneration, right eye, with active choroidal neovascularization: Secondary | ICD-10-CM | POA: Diagnosis not present

## 2022-10-03 DIAGNOSIS — H353122 Nonexudative age-related macular degeneration, left eye, intermediate dry stage: Secondary | ICD-10-CM | POA: Diagnosis not present

## 2022-10-15 ENCOUNTER — Other Ambulatory Visit (INDEPENDENT_AMBULATORY_CARE_PROVIDER_SITE_OTHER): Payer: Medicare Other

## 2022-10-15 DIAGNOSIS — R7303 Prediabetes: Secondary | ICD-10-CM | POA: Diagnosis not present

## 2022-10-15 DIAGNOSIS — E871 Hypo-osmolality and hyponatremia: Secondary | ICD-10-CM

## 2022-10-15 DIAGNOSIS — Z780 Asymptomatic menopausal state: Secondary | ICD-10-CM

## 2022-10-15 DIAGNOSIS — M8580 Other specified disorders of bone density and structure, unspecified site: Secondary | ICD-10-CM

## 2022-10-15 DIAGNOSIS — E039 Hypothyroidism, unspecified: Secondary | ICD-10-CM | POA: Diagnosis not present

## 2022-10-15 LAB — HEMOGLOBIN A1C: Hgb A1c MFr Bld: 6.5 % (ref 4.6–6.5)

## 2022-10-15 LAB — TSH: TSH: 2.53 u[IU]/mL (ref 0.35–5.50)

## 2022-10-15 LAB — BASIC METABOLIC PANEL
BUN: 20 mg/dL (ref 6–23)
CO2: 24 mEq/L (ref 19–32)
Calcium: 9.9 mg/dL (ref 8.4–10.5)
Chloride: 96 mEq/L (ref 96–112)
Creatinine, Ser: 0.79 mg/dL (ref 0.40–1.20)
GFR: 71 mL/min (ref >60.00)
Glucose, Bld: 133 mg/dL — ABNORMAL HIGH (ref 70–99)
Potassium: 4.6 mEq/L (ref 3.5–5.1)
Sodium: 127 mEq/L — ABNORMAL LOW (ref 135–145)

## 2022-10-15 LAB — VITAMIN D 25 HYDROXY (VIT D DEFICIENCY, FRACTURES): VITD: 38.34 ng/mL (ref 30.00–100.00)

## 2022-10-16 ENCOUNTER — Telehealth: Payer: Self-pay | Admitting: Cardiovascular Disease

## 2022-10-16 NOTE — Telephone Encounter (Signed)
Patient states she was getting muscle pain from fenofibrate but it was here and there. She stated her PCP had her to stop taking it for two weeks because she was getting a CT scan. Now that she is having now having muscle pain in her legs again and its waking her up at night. She stated she no longer wants to take the fenofibrate.

## 2022-10-16 NOTE — Telephone Encounter (Signed)
Last TG level 182 with fenofibrate treatment on board  (goal <150 mg/dl)  L/M to try taking every other day and see if myalgia is manageable. Or we can lower the dose to 45 mg daily  Current other lipid lowering agents patient is on  Repatha 140 every 14 days  Lovaza 2 g twice daily  Zetia 10 mg daily

## 2022-10-16 NOTE — Telephone Encounter (Signed)
Pt c/o medication issue:  1. Name of Medication: fenofibrate 160 MG tablet   2. How are you currently taking this medication (dosage and times per day)?   TAKE 1 TABLET(160 MG) BY MOUTH DAILY    3. Are you having a reaction (difficulty breathing--STAT)? No  4. What is your medication issue? Pt stated she'd like a callback to discuss this medication causing her to have bad cramps at night in her left leg. She stated she's going to stop taking it. Please advise

## 2022-10-17 ENCOUNTER — Ambulatory Visit: Payer: Medicare Other | Admitting: Endocrinology

## 2022-10-17 ENCOUNTER — Encounter: Payer: Self-pay | Admitting: Endocrinology

## 2022-10-17 VITALS — BP 110/72 | HR 77 | Ht 61.0 in | Wt 134.0 lb

## 2022-10-17 DIAGNOSIS — E039 Hypothyroidism, unspecified: Secondary | ICD-10-CM

## 2022-10-17 DIAGNOSIS — E871 Hypo-osmolality and hyponatremia: Secondary | ICD-10-CM | POA: Diagnosis not present

## 2022-10-17 DIAGNOSIS — M8580 Other specified disorders of bone density and structure, unspecified site: Secondary | ICD-10-CM | POA: Diagnosis not present

## 2022-10-17 DIAGNOSIS — Z780 Asymptomatic menopausal state: Secondary | ICD-10-CM

## 2022-10-17 DIAGNOSIS — R7303 Prediabetes: Secondary | ICD-10-CM | POA: Diagnosis not present

## 2022-10-17 NOTE — Progress Notes (Signed)
Patient ID: Brittany Figueroa, female   DOB: January 18, 1941, 82 y.o.   MRN: [id number]           Chief complaint: Endocrinology follow-up    History of Present Illness:  PREVIOUS history: Brittany Figueroa may have been told about 10 years ago that Brittany Figueroa had prediabetes and reportedly had a normal glucose tolerance test. Brittany Figueroa was told by her PCP that Brittany Figueroa has type 2 diabetes based on an A1c of 6.5% in 07/2013 Brittany Figueroa has not had any other A1c is subsequently above normal, normal range and previous lab 6.2 or less Brittany Figueroa also has not had any abnormal blood sugars except a glucose of 125 in 3/16  Has history of increased fasting blood sugars ranging from 108-125 since about 2012  GLUCOSE TOLERANCE test on 05/23/15: Fasting glucose 110, two-hour glucose 192  RECENT history:   Brittany Figueroa has impaired fasting glucose and glucose intolerance  Brittany Figueroa has been intermittently on metformin ER since about 08/2015 In 1/18 when A1c was 6.5 and glucose 133 fasting Brittany Figueroa was told to start back on metformin and also improve her diet Brittany Figueroa was taking 1500 mg a day but could not tolerate this because of feeling of bloating  Current regimen: Metformin ER 500 mg twice daily   A1c is 6.5 compared to 6.8, previously 6.4  Current blood sugar readings, management and history    Brittany Figueroa is checking her blood sugars occasionally fasting and did not bring her monitor today Brittany Figueroa is trying to walk as much as possible, has some issues with leg and foot pain Fasting readings are generally higher than in the evening and 133 again in the lab fasting Weight is stable   Using freestyle neo monitor  Weight history: Previous range 139-143  Wt Readings from Last 3 Encounters:  10/17/22 134 lb (60.8 kg)  06/18/22 133 lb (60.3 kg)  11/15/21 133 lb (60.3 kg)    Lab Results  Component Value Date   HGBA1C 6.5 10/15/2022   HGBA1C 6.8 (H) 06/16/2022   HGBA1C 6.4 02/05/2022   Lab Results  Component Value Date   MICROALBUR <0.7 07/17/2020   LDLCALC 67  10/07/2021   CREATININE 0.79 10/15/2022   HYPOTHYROIDISM and other problems reviewed today: See review of systems    Lab on 10/15/2022  Component Date Value Ref Range Status   VITD 10/15/2022 38.34  30.00 - 100.00 ng/mL Final   TSH 10/15/2022 2.53  0.35 - 5.50 uIU/mL Final   Sodium 10/15/2022 127 (L)  135 - 145 mEq/L Final   Potassium 10/15/2022 4.6  3.5 - 5.1 mEq/L Final   Chloride 10/15/2022 96  96 - 112 mEq/L Final   CO2 10/15/2022 24  19 - 32 mEq/L Final   Glucose, Bld 10/15/2022 133 (H)  70 - 99 mg/dL Final   BUN 40/98/1191 20  6 - 23 mg/dL Final   Creatinine, Ser 10/15/2022 0.79  0.40 - 1.20 mg/dL Final   GFR 47/82/9562 71.00  >60.00 mL/min Final   Calculated using the CKD-EPI Creatinine Equation (2021)   Calcium 10/15/2022 9.9  8.4 - 10.5 mg/dL Final   Hgb Z3Y MFr Bld 10/15/2022 6.5  4.6 - 6.5 % Final   Glycemic Control Guidelines for People with Diabetes:Non Diabetic:  <6%Goal of Therapy: <7%Additional Action Suggested:  >8%       Past Medical History:  Diagnosis Date   Abnormal finding on EKG, new anterolateral T-wave inversions  05/14/2012   Abnormal nuclear stress test, with infero-lateral ischemia 05/14/2012   Arthritis    "  right thumb; all my fingers" (05/13/2012), cervical spondylosis    Atypical angina 05/14/2012   CAD (coronary artery disease), 05/13/12, with 80% LAD 05/14/2012   Cancer (HCC)    basal cell on facial in L temporal region     [facility] arthritis, thumb, degenerative    Complication of anesthesia 12/2009   "had endoscopy; larynx went into spasms; stopped breathing for 15 seconds" (05/13/2012)   GERD (gastroesophageal reflux disease) 2011   Hypercholesteremia    Hypertension    S/P angioplasty with stent, LAD 05/13/12 05/14/2012   Tuberculosis    test +- [place] [facility] - Dr. Ronne Binning- CXR- OK    Past Surgical History:  Procedure Laterality Date   ANTERIOR CERVICAL DECOMP/DISCECTOMY FUSION N/A 12/13/2013   Procedure: ANTERIOR CERVICAL  DECOMPRESSION/DISCECTOMY FUSION 3 LEVELS Cervical four/five,five/six,six/seven. Anterior cervical disectomy and fusion with peek and plate ;  Surgeon: Temple Pacini, MD;  Location: [facility] NEURO ORS;  Service: Neurosurgery;  Laterality: N/A;   CARDIAC CATHETERIZATION N/A 07/02/2015   Procedure: Left Heart Cath and Coronary Angiography;  Surgeon: Runell Gess, MD;  Location: [facility] INVASIVE CV LAB;  Service: Cardiovascular;  Laterality: N/A;   CORONARY ANGIOPLASTY WITH STENT PLACEMENT  05/13/2012   DES to LAD; Brittany Figueroa has total RCA with left to rt coll. normal LV function done for positive nuc study   DILATION AND CURETTAGE OF UTERUS  1960's; 1970's; 1980   "probably 3" (05/13/2012)   FOREARM / WRIST TENDON LESION EXCISION  ?11/2001   "right; did waver to try to get ulnar out of hole that it had cut" (05/13/2012   INGUINAL HERNIA REPAIR  ~ 1966; 07/21/2002   "right; left" (05/13/2012)   LEFT HEART CATHETERIZATION WITH CORONARY ANGIOGRAM N/A 05/13/2012   Procedure: LEFT HEART CATHETERIZATION WITH CORONARY ANGIOGRAM;  Surgeon: Runell Gess, MD;  Location: [facility] CATH LAB;  Service: Cardiovascular;  Laterality: N/A;   OSTEOTOMY AND ULNAR SHORTENING  07/21/2002   "right" (05/13/2012)   TONSILLECTOMY AND ADENOIDECTOMY  1950's   TUBAL LIGATION  1980    Family History  Problem Relation Age of Onset   Hypertension Mother    Diabetes Mother    Hyperlipidemia Father    Heart disease Father    Stroke Father    Heart disease Sister    Diabetes Sister    Heart disease Brother    Diabetes Maternal Aunt     Social History:  reports that Brittany Figueroa quit smoking about 34 years ago. Her smoking use included cigarettes. Brittany Figueroa has a 10.00 pack-year smoking history. Brittany Figueroa has never used smokeless tobacco. Brittany Figueroa reports current alcohol use of about 12.0 standard drinks of alcohol per week. Brittany Figueroa reports that Brittany Figueroa does not use drugs.  Allergies:  Allergies  Allergen Reactions   Statins Other (See Comments)    "intermittent loss of  circulation; hands and arms will go to sleep; feet will get cramps; fatigue" (05/13/2012).  This occurred with Lipitor, Zocor, Crestor 5 mg qd, and Brittany Figueroa thinks pravastatin as well   Amlodipine Itching   Gadolinium Derivatives Itching and Nausea Only    Brittany Figueroa had severe nausea and itching on legs, neck and back, per dr Alfredo Batty Brittany Figueroa needs 13 hour prep before contrast in the future    Allergies as of 10/17/2022       Reactions   Statins Other (See Comments)   "intermittent loss of circulation; hands and arms will go to sleep; feet will get cramps; fatigue" (05/13/2012).  This occurred with Lipitor, Zocor, Crestor 5 mg  qd, and Brittany Figueroa thinks pravastatin as well   Amlodipine Itching   Gadolinium Derivatives Itching, Nausea Only   Brittany Figueroa had severe nausea and itching on legs, neck and back, per dr Alfredo Batty Brittany Figueroa needs 13 hour prep before contrast in the future        Medication List        Accurate as of Oct 17, 2022 10:35 AM. If you have any questions, ask your nurse or doctor.          acetaminophen 500 MG tablet Commonly known as: TYLENOL Take 500 mg by mouth every 6 (six) hours as needed for mild pain or headache.   ASPIRIN 81 PO 1 tablet   aspirin 81 MG tablet Take 81 mg by mouth daily.   calcium-vitamin D 500-200 MG-UNIT tablet Commonly known as: OSCAL WITH D Take 1 tablet by mouth daily as needed.   carvedilol 12.5 MG tablet Commonly known as: COREG Take 12.5 mg by mouth 2 (two) times daily with a meal.   clopidogrel 75 MG tablet Commonly known as: PLAVIX TAKE 1 TABLET(75 MG) BY MOUTH DAILY   diclofenac sodium 1 % Gel Commonly known as: VOLTAREN Apply 2 g topically daily as needed. For pain   diltiazem 240 MG 24 hr capsule Commonly known as: DILACOR XR TAKE 1 CAPSULE   Dilt-XR 240 MG 24 hr capsule Generic drug: diltiazem Take 240 mg by mouth daily.   ezetimibe 10 MG tablet Commonly known as: ZETIA TAKE 1 TABLET(10 MG) BY MOUTH DAILY   fenofibrate 160 MG tablet TAKE 1  TABLET(160 MG) BY MOUTH DAILY   glucose blood test strip Commonly known as: OneTouch Verio Use to test blood sugar once daily Dx code E11.9   irbesartan 300 MG tablet Commonly known as: AVAPRO TAKE 1/2 TABLET BY MOUTH TWICE DAILY IN THE MORNING AND AT BEDTIME   levothyroxine 25 MCG tablet Commonly known as: SYNTHROID TAKE 1 AND 1/2 TABLETS BY MOUTH DAILY BEFORE BREAKFAST   metFORMIN 500 MG 24 hr tablet Commonly known as: GLUCOPHAGE-XR TAKE 2 TABLETS BY MOUTH DAILY WITH SUPPER   multivitamin with minerals Tabs tablet Take 1 tablet by mouth daily.   nitroGLYCERIN 0.4 MG SL tablet Commonly known as: NITROSTAT PLACE 1 TABLET UNDER THE TONGUE EVERY 5 MINUTES AS NEEDED FOR CHEST PAIN   omega-3 acid ethyl esters 1 g capsule Commonly known as: LOVAZA Take 2 capsules (2 g total) by mouth 2 (two) times daily.   PRESERVISION AREDS 2+MULTI VIT PO Take 1 tablet by mouth in the morning and at bedtime.   Repatha SureClick 140 MG/ML Soaj Generic drug: Evolocumab INJECT CONTENTS OF ONE PEN UNDER THE SKIN EVERY 14 DAYS   triamterene-hydrochlorothiazide 37.5-25 MG tablet Commonly known as: MAXZIDE-25 Take 1 tablet by mouth daily. Patient taking  1/4 tablet   Vitamin D3 50 MCG (2000 UT) Tabs Take 2,000 Units by mouth 2 (two) times daily.        LABS:  Lab on 10/15/2022  Component Date Value Ref Range Status   VITD 10/15/2022 38.34  30.00 - 100.00 ng/mL Final   TSH 10/15/2022 2.53  0.35 - 5.50 uIU/mL Final   Sodium 10/15/2022 127 (L)  135 - 145 mEq/L Final   Potassium 10/15/2022 4.6  3.5 - 5.1 mEq/L Final   Chloride 10/15/2022 96  96 - 112 mEq/L Final   CO2 10/15/2022 24  19 - 32 mEq/L Final   Glucose, Bld 10/15/2022 133 (H)  70 - 99 mg/dL Final  BUN 10/15/2022 20  6 - 23 mg/dL Final   Creatinine, Ser 10/15/2022 0.79  0.40 - 1.20 mg/dL Final   GFR 16/02/9603 71.00  >60.00 mL/min Final   Calculated using the CKD-EPI Creatinine Equation (2021)   Calcium 10/15/2022 9.9  8.4 -  10.5 mg/dL Final   Hgb V4U MFr Bld 10/15/2022 6.5  4.6 - 6.5 % Final   Glycemic Control Guidelines for People with Diabetes:Non Diabetic:  <6%Goal of Therapy: <7%Additional Action Suggested:  >8%       Review of Systems    OSTEOPENIA:   Lowest T score is at the right neck femur previously -2.4  This is only 0.8 as of 11/14/2019 BMD at the left femoral neck was -2.0 compared to -1.8  Results from 08/14/2022 done on a different machine:   Lumbar spine L1-L2 Femoral neck (FN)   T-score -2.0 RFN: -2.2 LFN: -1.7   Apparently had tried Fosamax in the past and this caused some GI side effects Brittany Figueroa received RECLAST infusions on 12/15/17, 01/07/2020 and also 6/23   Vitamin D deficiency: Brittany Figueroa is taking OTC vitamin D 4000 U daily  Last vitamin D level is normal  HYPERTENSION: Treated with Avapro, diltiazem,  and Coreg, now taking spironolactone 25 mg instead of triamterene HCTZ This is followed by PCP.  Home BP has been checked regularly  BP as low as 110 systolic at home  HYPONATREMIA: This was felt to be from HCTZ use which was stopped but is  worse now at 127 Brittany Figueroa does not drink excessive fluid Brittany Figueroa is now on spironolactone but no other new medications   HYPERLIPIDEMIA: Followed by cardiology History of hyperlipidemia with some intolerance to statins, on Repatha and also on Zetia.   Has high triglycerides, with the following triglycerides   Last fasting lipids as follows  Lab Results  Component Value Date   CHOL 155 10/07/2021   CHOL 162 10/29/2020   CHOL 169 08/22/2020   Lab Results  Component Value Date   HDL 48.40 10/07/2021   HDL 47 10/29/2020   HDL 47 08/22/2020   Lab Results  Component Value Date   LDLCALC 67 10/07/2021   LDLCALC 76 10/29/2020   LDLCALC 83 08/22/2020   Lab Results  Component Value Date   TRIG 200.0 (H) 10/07/2021   TRIG 239 (H) 10/29/2020   TRIG 236 (H) 08/22/2020   Lab Results  Component Value Date   CHOLHDL 3 10/07/2021   CHOLHDL 3.4  10/29/2020   CHOLHDL 3.6 08/22/2020   Lab Results  Component Value Date   LDLDIRECT 130.1 04/04/2014       HYPOTHYROIDISM: TSH level previously has been as high as 5.6  This spontaneously improved but in 05/2018 was mildly increased again  At that time Brittany Figueroa was complaining of some fatigue, constipation, dry skin, some cold intolerance and difficulty losing weight.  Brittany Figueroa is taking levothyroxine 37.5 mcg daily, Brittany Figueroa did have some improvement in her fatigue with the higher dose Currently however Brittany Figueroa is still complaining of some fatigue  Her TSH is usually quite normal  Lab Results  Component Value Date   TSH 2.53 10/15/2022   TSH 2.71 06/16/2022   TSH 3.66 02/05/2022   FREET4 1.15 02/05/2022   FREET4 1.29 06/03/2021   FREET4 1.16 02/25/2021   CALCIUM levels: Usually has quite normal calcium levels usually below 10  Has not had any excessive vitamin D usage  Lab Results  Component Value Date   CALCIUM 9.9 10/15/2022  PHYSICAL EXAM:  BP 110/72   Pulse 77   Ht 5\' 1"  (1.549 m)   Wt 134 lb (60.8 kg)   SpO2 99%   BMI 25.32 kg/m      ASSESSMENT:   Prediabetes with impaired fasting glucose and also impaired glucose tolerance  Most of her A1c results and blood sugars in the past have been in the prediabetic range  Previously A1c was up to 6.8 likely from steroids but now are down to 6.5 Most recent fasting glucose in the lab 133  Brittany Figueroa is on 500 mg of Metformin twice a day Not gaining weight  HYPOTHYROIDISM: Well-controlled with TSH normal  Mild hyponatremia: Possibly from spironolactone as this is the only change from last visit Not drinking excessive limits  OSTEOPENIA: Has had 3 infusions of Reclast with last bone density stable Since the last study was done from a different machine difficult to compare, lowest T-score now -2.2  PLAN:   Brittany Figueroa will continue metformin 500 mg twice daily Brittany Figueroa will check blood sugars alternating fasting and after meals and bring  monitor for download on the next visit To walk is much as possible  Hypothyroidism: Brittany Figueroa will continue 1-1/2 tablets of 25 mcg levothyroxine  STOP spironolactone, should not affect her blood pressure which is low normal currently Follow-up in 1 month, may need further workup if sodium still low  Brittany Figueroa will have Reclast every 2 years for osteopenia    Follow-up in  4 months     Brittany Figueroa 10/17/2022, 10:35 AM

## 2022-10-17 NOTE — Patient Instructions (Signed)
Stop Spironolactone   

## 2022-10-19 ENCOUNTER — Other Ambulatory Visit: Payer: Self-pay | Admitting: Cardiovascular Disease

## 2022-10-22 ENCOUNTER — Telehealth: Payer: Self-pay | Admitting: Cardiovascular Disease

## 2022-10-22 NOTE — Telephone Encounter (Signed)
Pt c/o medication issue:  1. Name of Medication:   aspirin 81 MG tablet   2. How are you currently taking this medication (dosage and times per day)?  As prescribed  3. Are you having a reaction (difficulty breathing--STAT)?   4. What is your medication issue?   Patient stated her endocrinologist has taken her off of the aspirin as she has been having red spots on her arms and legs.  Patient stated she was also taken off Spironolactone which she states caused her sodium levels to decrease.

## 2022-10-23 MED ORDER — ASPIRIN 81 MG PO TBEC: 81.0000 mg | DELAYED_RELEASE_TABLET | Freq: Every day | ORAL | 3 refills | Status: AC

## 2022-10-23 NOTE — Telephone Encounter (Signed)
Was told that the Aspirin was causing the bruises/dark marks on arms and was discontinued. She went on 10/15/22 to Dr Lucianne Muss, and she said that the spots are already much better. She was also taken off of spironolactone due to low sodium.  Gave her Dr Hazle Coca message, that she needs to be on ASA 81mg  since she has CAD. She said that she will go back on the aspirin if that is what he recommends. She asked about the spironolactone. I told her that medication is a diuretic, so she should monitor for swelling and weight gain. She verbalized understanding. Appt made for 12 month f/u

## 2022-10-28 DIAGNOSIS — H353211 Exudative age-related macular degeneration, right eye, with active choroidal neovascularization: Secondary | ICD-10-CM | POA: Diagnosis not present

## 2022-10-28 DIAGNOSIS — H43813 Vitreous degeneration, bilateral: Secondary | ICD-10-CM | POA: Diagnosis not present

## 2022-10-28 DIAGNOSIS — H353122 Nonexudative age-related macular degeneration, left eye, intermediate dry stage: Secondary | ICD-10-CM | POA: Diagnosis not present

## 2022-10-28 DIAGNOSIS — H35033 Hypertensive retinopathy, bilateral: Secondary | ICD-10-CM | POA: Diagnosis not present

## 2022-10-29 DIAGNOSIS — L820 Inflamed seborrheic keratosis: Secondary | ICD-10-CM | POA: Diagnosis not present

## 2022-10-29 DIAGNOSIS — L538 Other specified erythematous conditions: Secondary | ICD-10-CM | POA: Diagnosis not present

## 2022-10-29 DIAGNOSIS — L298 Other pruritus: Secondary | ICD-10-CM | POA: Diagnosis not present

## 2022-11-02 DIAGNOSIS — H353122 Nonexudative age-related macular degeneration, left eye, intermediate dry stage: Secondary | ICD-10-CM | POA: Diagnosis not present

## 2022-11-17 ENCOUNTER — Other Ambulatory Visit (INDEPENDENT_AMBULATORY_CARE_PROVIDER_SITE_OTHER): Payer: Medicare Other

## 2022-11-17 DIAGNOSIS — E871 Hypo-osmolality and hyponatremia: Secondary | ICD-10-CM

## 2022-11-17 LAB — BASIC METABOLIC PANEL
BUN: 17 mg/dL (ref 6–23)
CO2: 26 mEq/L (ref 19–32)
Calcium: 10.4 mg/dL (ref 8.4–10.5)
Chloride: 101 mEq/L (ref 96–112)
Creatinine, Ser: 0.73 mg/dL (ref 0.40–1.20)
GFR: 78.01 mL/min (ref >60.00)
Glucose, Bld: 108 mg/dL — ABNORMAL HIGH (ref 70–99)
Potassium: 4.5 mEq/L (ref 3.5–5.1)
Sodium: 134 mEq/L — ABNORMAL LOW (ref 135–145)

## 2022-11-19 ENCOUNTER — Ambulatory Visit: Payer: Medicare Other | Attending: Cardiovascular Disease | Admitting: Cardiovascular Disease

## 2022-11-19 ENCOUNTER — Encounter: Payer: Self-pay | Admitting: Cardiovascular Disease

## 2022-11-19 VITALS — BP 122/68 | HR 81 | Ht 61.0 in | Wt 134.0 lb

## 2022-11-19 DIAGNOSIS — I2510 Atherosclerotic heart disease of native coronary artery without angina pectoris: Secondary | ICD-10-CM

## 2022-11-19 DIAGNOSIS — I4510 Unspecified right bundle-branch block: Secondary | ICD-10-CM | POA: Diagnosis not present

## 2022-11-19 DIAGNOSIS — I10 Essential (primary) hypertension: Secondary | ICD-10-CM

## 2022-11-19 DIAGNOSIS — E785 Hyperlipidemia, unspecified: Secondary | ICD-10-CM | POA: Diagnosis not present

## 2022-11-19 MED ORDER — OMEGA-3-ACID ETHYL ESTERS 1 G PO CAPS
2.00 g | ORAL_CAPSULE | Freq: Two times a day (BID) | ORAL | 12 refills | Status: DC
Start: 2022-11-19 — End: 2022-11-21

## 2022-11-19 NOTE — Assessment & Plan Note (Signed)
History of dyslipidemia on Repatha, Zetia, fenofibrate as well as Lovaza which she is not taking.  She says the fenofibrate causes cramping in her legs.  Her most recent lipid profile performed 06/06/2022 revealed total cholesterol 173, LDL of 83, HDL 59 and triglyceride level of 182.  I told her that she can stop her fenofibrate but she should be taking her Lovaza 2 2 capsules twice a day.  Will recheck a lipid and liver profile in 3 months.

## 2022-11-19 NOTE — Assessment & Plan Note (Signed)
History of CAD status post cardiac catheterization 05/13/2012 revealing an 80% proximal LAD lesion which I stented using drug-eluting stent as well as a total dominant RCA with left-to-right collaterals and normal LV function.  She has had no interventional procedures since that time.  She denies chest pain or shortness of breath.

## 2022-11-19 NOTE — Assessment & Plan Note (Signed)
History of essential hypertension blood pressure measured today at 122/68.  I reviewed her blood pressure log and her blood pressures for the most part are within normal range.  She is on carvedilol, diltiazem, and Avapro as well as Maxide.

## 2022-11-19 NOTE — Patient Instructions (Signed)
Medication Instructions:  Your physician has recommended you make the following change in your medication:   -Stop taking fenofibrate.  -Make sure you are taking Lovaza 2g twice daily.  *If you need a refill on your cardiac medications before your next appointment, please call your pharmacy*   Lab Work: Your physician recommends that you return for lab work in: 3 months for FASTING lipid/liver panel  If you have labs (blood work) drawn today and your tests are completely normal, you will receive your results only by: MyChart Message (if you have MyChart) OR A paper copy in the mail If you have any lab test that is abnormal or we need to change your treatment, we will call you to review the results.   Follow-Up: At [facility], you and your health needs are our priority.  As part of our continuing mission to provide you with exceptional heart care, we have created designated Provider Care Teams.  These Care Teams include your primary Cardiologist (physician) and Advanced Practice Providers (APPs -  Physician Assistants and Nurse Practitioners) who all work together to provide you with the care you need, when you need it.  We recommend signing up for the patient portal called "MyChart".  Sign up information is provided on this After Visit Summary.  MyChart is used to connect with patients for Virtual Visits (Telemedicine).  Patients are able to view lab/test results, encounter notes, upcoming appointments, etc.  Non-urgent messages can be sent to your provider as well.   To learn more about what you can do with MyChart, go to [email or web address].    Your next appointment:   12 month(s)  Provider:   Nanetta Batty, MD

## 2022-11-19 NOTE — Addendum Note (Signed)
Addended by: Bernita Buffy on: 11/19/2022 11:41 AM   Modules accepted: Orders

## 2022-11-19 NOTE — Assessment & Plan Note (Signed)
new since previous tracing

## 2022-11-19 NOTE — Progress Notes (Signed)
11/19/2022 Brittany Figueroa   24-Dec-1940  [id number]  Primary Physician Emilio Aspen, MD Primary Cardiologist: Runell Gess MD FACP, [place], [place], [place]  HPI:  Brittany Figueroa is a 82 y.o.  mildly overweight divorced Caucasian female, mother to 2, grandmother to 3 grandchildren, who I last saw in the office 11/15/2021. She has a history of hypertension and hyperlipidemia. I saw her in December of 2013 with new anterolateral T-wave inversion and a Myoview that showed inferolateral ischemia that was new compared to a prior study. She did have some unusual symptoms at that time. Based on this, I catheterized her on May 13, 2012, revealing an 80% proximal LAD lesion, which I stented using a drug-eluting stent, as well as a total dominant RCA with left to right collaterals and normal LV function. Since stenting her LAD, she is ultimately asymptomatic. Her other problems include hypertension and hyperlipidemia. I have reviewed her blood pressures, which were recorded during cardiac rehab, which were all normal. Her recent blood work revealed a total cholesterol of 161, LDL of 89 and HDL of 40.she denies chest pain or shortness of breath. She had a recent Myoview stress test performed several months ago that showed ischemia in the RCA territory but otherwise unremarkable is explainable by her known total dominant RCA with left to right collaterals. Chief complaint of excruciating neck pain thought to be related to cervical disc disease. She was evaluated by Dr. Because of her neck pain demonstrated cervical disc disease requiring fusion. Because of this she will need to undergo pharmacologic Myoview stress testing to risk stratify her. She underwent cervical discectomy by Dr. Lelon Perla in 2016 with excellent clinical result. She no longer is in pain. Her major issue now is treatment of her hyperlipidemia. She is statin intolerant and complains of short-term memory loss.   She did contract  COVID-19 in July of last year but has recovered.  She is tolerating Repatha without side effects excellent lipid profile with LDL of 74 measured 03/14/2019.     Since I saw her in the office a year ago she continues to do well.  She denies chest pain or shortness of breath.  She denies chest the fenofibrate causes pain in her calf.  She has a pleuritic pain in her left shoulder blade.   Current Meds  Medication Sig   acetaminophen (TYLENOL) 500 MG tablet Take 500 mg by mouth every 6 (six) hours as needed for mild pain or headache.   aspirin EC (ASPIRIN 81) 81 MG tablet Take 1 tablet (81 mg total) by mouth daily.   calcium-vitamin D (OSCAL WITH D) 500-200 MG-UNIT per tablet Take 1 tablet by mouth daily as needed.   carvedilol (COREG) 12.5 MG tablet Take 12.5 mg by mouth 2 (two) times daily with a meal.   Cholecalciferol (VITAMIN D3) 2000 UNITS TABS Take 2,000 Units by mouth 2 (two) times daily.   clopidogrel (PLAVIX) 75 MG tablet TAKE 1 TABLET(75 MG) BY MOUTH DAILY   diclofenac sodium (VOLTAREN) 1 % GEL Apply 2 g topically daily as needed. For pain   DILT-XR 240 MG 24 hr capsule Take 240 mg by mouth daily.   ezetimibe (ZETIA) 10 MG tablet TAKE 1 TABLET(10 MG) BY MOUTH DAILY   fenofibrate 160 MG tablet TAKE 1 TABLET(160 MG) BY MOUTH DAILY   glucose blood (ONETOUCH VERIO) test strip Use to test blood sugar once daily Dx code E11.9   irbesartan (AVAPRO) 300 MG tablet TAKE  1/2 TABLET BY MOUTH TWICE DAILY IN THE MORNING AND AT BEDTIME   levothyroxine (SYNTHROID) 25 MCG tablet TAKE 1 AND 1/2 TABLETS BY MOUTH DAILY BEFORE BREAKFAST   metFORMIN (GLUCOPHAGE-XR) 500 MG 24 hr tablet TAKE 2 TABLETS BY MOUTH DAILY WITH SUPPER   Multiple Vitamin (MULTIVITAMIN WITH MINERALS) TABS Take 1 tablet by mouth daily.   Multiple Vitamins-Minerals (PRESERVISION AREDS 2+MULTI VIT PO) Take 1 tablet by mouth in the morning and at bedtime.   nitroGLYCERIN (NITROSTAT) 0.4 MG SL tablet PLACE 1 TABLET UNDER THE TONGUE EVERY  5 MINUTES AS NEEDED FOR CHEST PAIN   omega-3 acid ethyl esters (LOVAZA) 1 g capsule Take 2 capsules (2 g total) by mouth 2 (two) times daily.   REPATHA SURECLICK 140 MG/ML SOAJ INJECT CONTENTS OF ONE PEN UNDER THE SKIN EVERY 14 DAYS   triamterene-hydrochlorothiazide (MAXZIDE-25) 37.5-25 MG tablet Take 1 tablet by mouth daily. Patient taking  1/4 tablet     Allergies  Allergen Reactions   Statins Other (See Comments)    "intermittent loss of circulation; hands and arms will go to sleep; feet will get cramps; fatigue" (05/13/2012).  This occurred with Lipitor, Zocor, Crestor 5 mg qd, and she thinks pravastatin as well   Amlodipine Itching   Gadolinium Derivatives Itching and Nausea Only    Pt had severe nausea and itching on legs, neck and back, per dr Alfredo Batty pt needs 13 hour prep before contrast in the future    Social History   Socioeconomic History   Marital status: Divorced    Spouse name: Not on file   Number of children: Not on file   Years of education: Not on file   Highest education level: Not on file  Occupational History   Not on file  Tobacco Use   Smoking status: Former    Packs/day: 0.50    Years: 20.00    Additional pack years: 0.00    Total pack years: 10.00    Types: Cigarettes    Quit date: 07/05/1988    Years since quitting: 34.3   Smokeless tobacco: Never  Substance and Sexual Activity   Alcohol use: Yes    Alcohol/week: 12.0 standard drinks of alcohol    Types: 12 Glasses of wine per week    Comment: 05/13/2012 "6-8 oz glass of red wine q hs"    Drug use: No   Sexual activity: Never  Other Topics Concern   Not on file  Social History Narrative   Not on file   Social Determinants of Health   Financial Resource Strain: Not on file  Food Insecurity: Not on file  Transportation Needs: Not on file  Physical Activity: Not on file  Stress: Not on file  Social Connections: Not on file  Intimate Partner Violence: Not on file     Review of  Systems: General: negative for chills, fever, night sweats or weight changes.  Cardiovascular: negative for chest pain, dyspnea on exertion, edema, orthopnea, palpitations, paroxysmal nocturnal dyspnea or shortness of breath Dermatological: negative for rash Respiratory: negative for cough or wheezing Urologic: negative for hematuria Abdominal: negative for nausea, vomiting, diarrhea, bright red blood per rectum, melena, or hematemesis Neurologic: negative for visual changes, syncope, or dizziness All other systems reviewed and are otherwise negative except as noted above.    Blood pressure 122/68, pulse 81, height 5\' 1"  (1.549 m), weight 134 lb (60.8 kg), SpO2 97 %.  General appearance: alert and no distress Neck: no adenopathy, no carotid bruit, no  JVD, supple, symmetrical, trachea midline, and thyroid not enlarged, symmetric, no tenderness/mass/nodules Lungs: clear to auscultation bilaterally Heart: regular rate and rhythm, S1, S2 normal, no murmur, click, rub or gallop Extremities: extremities normal, atraumatic, no cyanosis or edema Pulses: 2+ and symmetric Skin: Skin color, texture, turgor normal. No rashes or lesions Neurologic: Grossly normal  EKG EKG Interpretation Date/Time:  Wednesday November 19 2022 10:57:53 EDT Ventricular Rate:  81 PR Interval:  180 QRS Duration:  118 QT Interval:  396 QTC Calculation: 460 R Axis:   -63  Text Interpretation: Normal sinus rhythm Incomplete right bundle branch block Left anterior fascicular block Minimal voltage criteria for LVH, may be normal variant ( Cornell product ) When compared with ECG of 02-Jul-2015 10:27, Incomplete right bundle branch block is now Present Criteria for Septal infarct are no longer Present Confirmed by Nanetta Batty [phone]) on 11/19/2022 11:16:55 AM    ASSESSMENT AND PLAN:   CAD- residual total RCA and Dx disease History of CAD status post cardiac catheterization 05/13/2012 revealing an 80% proximal LAD lesion  which I stented using drug-eluting stent as well as a total dominant RCA with left-to-right collaterals and normal LV function.  She has had no interventional procedures since that time.  She denies chest pain or shortness of breath.  Dyslipidemia History of dyslipidemia on Repatha, Zetia, fenofibrate as well as Lovaza which she is not taking.  She says the fenofibrate causes cramping in her legs.  Her most recent lipid profile performed 06/06/2022 revealed total cholesterol 173, LDL of 83, HDL 59 and triglyceride level of 182.  I told her that she can stop her fenofibrate but she should be taking her Lovaza 2 2 capsules twice a day.  Will recheck a lipid and liver profile in 3 months.  Essential hypertension History of essential hypertension blood pressure measured today at 122/68.  I reviewed her blood pressure log and her blood pressures for the most part are within normal range.  She is on carvedilol, diltiazem, and Avapro as well as Maxide.  Right bundle branch block new since previous tracing     Runell Gess MD [facility], [facility] 11/19/2022 11:27 AM

## 2022-11-19 NOTE — Addendum Note (Signed)
Addended by: Bernita Buffy on: 11/19/2022 11:49 AM   Modules accepted: Orders

## 2022-11-21 ENCOUNTER — Ambulatory Visit: Payer: Medicare Other | Admitting: Endocrinology

## 2022-11-21 ENCOUNTER — Telehealth: Payer: Self-pay | Admitting: Cardiovascular Disease

## 2022-11-21 VITALS — BP 120/68 | HR 89 | Ht 61.0 in | Wt 135.0 lb

## 2022-11-21 DIAGNOSIS — E871 Hypo-osmolality and hyponatremia: Secondary | ICD-10-CM | POA: Diagnosis not present

## 2022-11-21 DIAGNOSIS — R7303 Prediabetes: Secondary | ICD-10-CM

## 2022-11-21 DIAGNOSIS — E039 Hypothyroidism, unspecified: Secondary | ICD-10-CM

## 2022-11-21 MED ORDER — VASCEPA 1 G PO CAPS: 1.0000 g | ORAL_CAPSULE | Freq: Two times a day (BID) | ORAL | 1 refills | Status: DC

## 2022-11-21 NOTE — Telephone Encounter (Signed)
Spoke with patient - she recently purchased OTC fish oil (Nature Made) as she cannot afford Lovaza  Her insurance will cover Vascepa for $47/month.  Will have her take Vascepa 1 gm bid along with her OTC product (also 1 bid) until that supply is used up.  Then we can check labs and increase Vascepa to 2 gm bid if needed.

## 2022-11-21 NOTE — Progress Notes (Unsigned)
Patient ID: Brittany Figueroa, female   DOB: May 04, 1941, 82 y.o.   MRN: [id number]           Chief complaint: Endocrinology follow-up    History of Present Illness:  PREVIOUS history: She may have been told about 10 years ago that she had prediabetes and reportedly had a normal glucose tolerance test. She was told by her PCP that she has type 2 diabetes based on an A1c of 6.5% in 07/2013 She has not had any other A1c is subsequently above normal, normal range and previous lab 6.2 or less She also has not had any abnormal blood sugars except a glucose of 125 in 3/16  Has history of increased fasting blood sugars ranging from 108-125 since about 2012  GLUCOSE TOLERANCE test on 05/23/15: Fasting glucose 110, two-hour glucose 192  RECENT history:   She has impaired fasting glucose and glucose intolerance  She has been intermittently on metformin ER since about 08/2015 In 1/18 when A1c was 6.5 and glucose 133 fasting she was told to start back on metformin and also improve her diet She was taking 1500 mg a day but could not tolerate this because of feeling of bloating  Current regimen: Metformin ER 500 mg twice daily   A1c is last 6.5 compared to 6.8, previously 6.4  Current blood sugar readings, management and history   The following is a copy of the previous note: She is checking her blood sugars occasionally fasting and did not bring her monitor today She is trying to walk as much as possible, has some issues with leg and foot pain Fasting readings are generally higher than in the evening and 133 again in the lab fasting Weight is stable   Using freestyle neo monitor  Weight history: Previous range 139-143  Wt Readings from Last 3 Encounters:  11/21/22 135 lb (61.2 kg)  11/19/22 134 lb (60.8 kg)  10/17/22 134 lb (60.8 kg)    Lab Results  Component Value Date   HGBA1C 6.5 10/15/2022   HGBA1C 6.8 (H) 06/16/2022   HGBA1C 6.4 02/05/2022   Lab Results  Component Value Date    MICROALBUR <0.7 07/17/2020   LDLCALC 67 10/07/2021   CREATININE 0.73 11/17/2022   Other problems reviewed today: See review of systems    Lab on 11/17/2022  Component Date Value Ref Range Status   Sodium 11/17/2022 134 (L)  135 - 145 mEq/L Final   Potassium 11/17/2022 4.5  3.5 - 5.1 mEq/L Final   Chloride 11/17/2022 101  96 - 112 mEq/L Final   CO2 11/17/2022 26  19 - 32 mEq/L Final   Glucose, Bld 11/17/2022 108 (H)  70 - 99 mg/dL Final   BUN 95/62/1308 17  6 - 23 mg/dL Final   Creatinine, Ser 11/17/2022 0.73  0.40 - 1.20 mg/dL Final   GFR 65/78/4696 78.01  >60.00 mL/min Final   Calculated using the CKD-EPI Creatinine Equation (2021)   Calcium 11/17/2022 10.4  8.4 - 10.5 mg/dL Final      Past Medical History:  Diagnosis Date   Abnormal finding on EKG, new anterolateral T-wave inversions  05/14/2012   Abnormal nuclear stress test, with infero-lateral ischemia 05/14/2012   Arthritis    "right thumb; all my fingers" (05/13/2012), cervical spondylosis    Atypical angina 05/14/2012   CAD (coronary artery disease), 05/13/12, with 80% LAD 05/14/2012   Cancer (HCC)    basal cell on facial in L temporal region     CMC arthritis,  thumb, degenerative    Complication of anesthesia 12/2009   "had endoscopy; larynx went into spasms; stopped breathing for 15 seconds" (05/13/2012)   GERD (gastroesophageal reflux disease) 2011   Hypercholesteremia    Hypertension    S/P angioplasty with stent, LAD 05/13/12 05/14/2012   Tuberculosis    test +- [place] [facility] - Dr. Ronne Binning- CXR- OK    Past Surgical History:  Procedure Laterality Date   ANTERIOR CERVICAL DECOMP/DISCECTOMY FUSION N/A 12/13/2013   Procedure: ANTERIOR CERVICAL DECOMPRESSION/DISCECTOMY FUSION 3 LEVELS Cervical four/five,five/six,six/seven. Anterior cervical disectomy and fusion with peek and plate ;  Surgeon: Temple Pacini, MD;  Location: [facility] NEURO ORS;  Service: Neurosurgery;  Laterality: N/A;   CARDIAC CATHETERIZATION N/A  07/02/2015   Procedure: Left Heart Cath and Coronary Angiography;  Surgeon: Runell Gess, MD;  Location: [facility] INVASIVE CV LAB;  Service: Cardiovascular;  Laterality: N/A;   CORONARY ANGIOPLASTY WITH STENT PLACEMENT  05/13/2012   DES to LAD; she has total RCA with left to rt coll. normal LV function done for positive nuc study   DILATION AND CURETTAGE OF UTERUS  1960's; 1970's; 1980   "probably 3" (05/13/2012)   FOREARM / WRIST TENDON LESION EXCISION  ?11/2001   "right; did waver to try to get ulnar out of hole that it had cut" (05/13/2012   INGUINAL HERNIA REPAIR  ~ 1966; 07/21/2002   "right; left" (05/13/2012)   LEFT HEART CATHETERIZATION WITH CORONARY ANGIOGRAM N/A 05/13/2012   Procedure: LEFT HEART CATHETERIZATION WITH CORONARY ANGIOGRAM;  Surgeon: Runell Gess, MD;  Location: [facility] CATH LAB;  Service: Cardiovascular;  Laterality: N/A;   OSTEOTOMY AND ULNAR SHORTENING  07/21/2002   "right" (05/13/2012)   TONSILLECTOMY AND ADENOIDECTOMY  1950's   TUBAL LIGATION  1980    Family History  Problem Relation Age of Onset   Hypertension Mother    Diabetes Mother    Hyperlipidemia Father    Heart disease Father    Stroke Father    Heart disease Sister    Diabetes Sister    Heart disease Brother    Diabetes Maternal Aunt     Social History:  reports that she quit smoking about 34 years ago. Her smoking use included cigarettes. She has a 10.00 pack-year smoking history. She has never used smokeless tobacco. She reports current alcohol use of about 12.0 standard drinks of alcohol per week. She reports that she does not use drugs.  Allergies:  Allergies  Allergen Reactions   Statins Other (See Comments)    "intermittent loss of circulation; hands and arms will go to sleep; feet will get cramps; fatigue" (05/13/2012).  This occurred with Lipitor, Zocor, Crestor 5 mg qd, and she thinks pravastatin as well   Amlodipine Itching   Gadolinium Derivatives Itching and Nausea Only    Pt had severe  nausea and itching on legs, neck and back, per dr Alfredo Batty pt needs 13 hour prep before contrast in the future    Allergies as of 11/21/2022       Reactions   Statins Other (See Comments)   "intermittent loss of circulation; hands and arms will go to sleep; feet will get cramps; fatigue" (05/13/2012).  This occurred with Lipitor, Zocor, Crestor 5 mg qd, and she thinks pravastatin as well   Amlodipine Itching   Gadolinium Derivatives Itching, Nausea Only   Pt had severe nausea and itching on legs, neck and back, per dr Alfredo Batty pt needs 13 hour prep before contrast in the future  Medication List        Accurate as of November 21, 2022 11:48 AM. If you have any questions, ask your nurse or doctor.          acetaminophen 500 MG tablet Commonly known as: TYLENOL Take 500 mg by mouth every 6 (six) hours as needed for mild pain or headache.   aspirin EC 81 MG tablet Commonly known as: Aspirin 81 Take 1 tablet (81 mg total) by mouth daily.   calcium-vitamin D 500-200 MG-UNIT tablet Commonly known as: OSCAL WITH D Take 1 tablet by mouth daily as needed.   carvedilol 12.5 MG tablet Commonly known as: COREG Take 12.5 mg by mouth 2 (two) times daily with a meal.   clopidogrel 75 MG tablet Commonly known as: PLAVIX TAKE 1 TABLET(75 MG) BY MOUTH DAILY   diclofenac sodium 1 % Gel Commonly known as: VOLTAREN Apply 2 g topically daily as needed. For pain   diltiazem 240 MG 24 hr capsule Commonly known as: DILACOR XR   Dilt-XR 240 MG 24 hr capsule Generic drug: diltiazem Take 240 mg by mouth daily.   ezetimibe 10 MG tablet Commonly known as: ZETIA TAKE 1 TABLET(10 MG) BY MOUTH DAILY   glucose blood test strip Commonly known as: OneTouch Verio Use to test blood sugar once daily Dx code E11.9   irbesartan 300 MG tablet Commonly known as: AVAPRO TAKE 1/2 TABLET BY MOUTH TWICE DAILY IN THE MORNING AND AT BEDTIME   levothyroxine 25 MCG tablet Commonly known as:  SYNTHROID TAKE 1 AND 1/2 TABLETS BY MOUTH DAILY BEFORE BREAKFAST   metFORMIN 500 MG 24 hr tablet Commonly known as: GLUCOPHAGE-XR TAKE 2 TABLETS BY MOUTH DAILY WITH SUPPER   multivitamin with minerals Tabs tablet Take 1 tablet by mouth daily.   nitroGLYCERIN 0.4 MG SL tablet Commonly known as: NITROSTAT PLACE 1 TABLET UNDER THE TONGUE EVERY 5 MINUTES AS NEEDED FOR CHEST PAIN   omega-3 acid ethyl esters 1 g capsule Commonly known as: LOVAZA Take 2 capsules (2 g total) by mouth 2 (two) times daily.   PRESERVISION AREDS 2+MULTI VIT PO Take 1 tablet by mouth in the morning and at bedtime.   Repatha SureClick 140 MG/ML Soaj Generic drug: Evolocumab INJECT CONTENTS OF ONE PEN UNDER THE SKIN EVERY 14 DAYS   triamterene-hydrochlorothiazide 37.5-25 MG tablet Commonly known as: MAXZIDE-25 Take 1 tablet by mouth daily. Patient taking  1/4 tablet   Vitamin D3 50 MCG (2000 UT) Tabs Take 2,000 Units by mouth 2 (two) times daily.        LABS:  Lab on 11/17/2022  Component Date Value Ref Range Status   Sodium 11/17/2022 134 (L)  135 - 145 mEq/L Final   Potassium 11/17/2022 4.5  3.5 - 5.1 mEq/L Final   Chloride 11/17/2022 101  96 - 112 mEq/L Final   CO2 11/17/2022 26  19 - 32 mEq/L Final   Glucose, Bld 11/17/2022 108 (H)  70 - 99 mg/dL Final   BUN 16/02/9603 17  6 - 23 mg/dL Final   Creatinine, Ser 11/17/2022 0.73  0.40 - 1.20 mg/dL Final   GFR 54/01/8118 78.01  >60.00 mL/min Final   Calculated using the CKD-EPI Creatinine Equation (2021)   Calcium 11/17/2022 10.4  8.4 - 10.5 mg/dL Final      Review of Systems    OSTEOPENIA:   Lowest T score is at the right neck femur previously -2.4  This is only 0.8 as of 11/14/2019 BMD at the left  femoral neck was -2.0 compared to -1.8  Results from 08/14/2022 done on a different machine:   Lumbar spine L1-L2 Femoral neck (FN)   T-score -2.0 RFN: -2.2 LFN: -1.7   Apparently had tried Fosamax in the past and this caused some GI  side effects She received RECLAST infusions on 12/15/17, 01/07/2020 and also 6/23   Vitamin D deficiency: She is taking OTC vitamin D 4000 U daily  Last vitamin D level is normal  HYPERTENSION: Treated with Avapro, diltiazem,  and Coreg, now taking spironolactone 25 mg instead of triamterene HCTZ This is followed by PCP.  Home BP has been checked regularly: Recent readings about 125-135  BP Readings from Last 3 Encounters:  11/21/22 120/68  11/19/22 122/68  10/17/22 110/72     HYPONATREMIA: This was felt to be from HCTZ use which was stopped but is  worse on the last visit at 127 She does not drink excessive fluid She is now off  spironolactone since about a month ago with sodium coming back to 134   HYPERLIPIDEMIA: Followed by cardiology History of hyperlipidemia with some intolerance to statins, on Repatha and also on Zetia.   Has high triglycerides, with the following triglycerides   Last fasting lipids as follows  Lab Results  Component Value Date   CHOL 155 10/07/2021   CHOL 162 10/29/2020   CHOL 169 08/22/2020   Lab Results  Component Value Date   HDL 48.40 10/07/2021   HDL 47 10/29/2020   HDL 47 08/22/2020   Lab Results  Component Value Date   LDLCALC 67 10/07/2021   LDLCALC 76 10/29/2020   LDLCALC 83 08/22/2020   Lab Results  Component Value Date   TRIG 200.0 (H) 10/07/2021   TRIG 239 (H) 10/29/2020   TRIG 236 (H) 08/22/2020   Lab Results  Component Value Date   CHOLHDL 3 10/07/2021   CHOLHDL 3.4 10/29/2020   CHOLHDL 3.6 08/22/2020   Lab Results  Component Value Date   LDLDIRECT 130.1 04/04/2014       HYPOTHYROIDISM: TSH level previously has been as high as 5.6  This spontaneously improved but in 05/2018 was mildly increased again  At that time she was complaining of some fatigue, constipation, dry skin, some cold intolerance and difficulty losing weight.  She is taking levothyroxine 37.5 mcg daily, she did have some improvement in her  fatigue with the higher dose  Her TSH is usually quite normal  Lab Results  Component Value Date   TSH 2.53 10/15/2022   TSH 2.71 06/16/2022   TSH 3.66 02/05/2022   FREET4 1.15 02/05/2022   FREET4 1.29 06/03/2021   FREET4 1.16 02/25/2021   CALCIUM levels: Usually has quite normal calcium levels usually below 10  Has not had any excessive vitamin D usage  Lab Results  Component Value Date   CALCIUM 10.4 11/17/2022     PHYSICAL EXAM:  BP 120/68 (Patient Position: Standing)   Pulse 89   Ht 5\' 1"  (1.549 m)   Wt 135 lb (61.2 kg)   SpO2 95%   BMI 25.51 kg/m    Diabetic Foot Exam - Simple   Simple Foot Form Diabetic Foot exam was performed with the following findings: Yes   Visual Inspection No deformities, no ulcerations, no other skin breakdown bilaterally: Yes Sensation Testing Intact to touch and monofilament testing bilaterally: Yes Pulse Check Posterior Tibialis and Dorsalis pulse intact bilaterally: Yes Comments      ASSESSMENT:   Prediabetes with impaired fasting  glucose and also impaired glucose tolerance  Last A1c result 6.5 Most recent fasting glucose in the lab 108 nonfasting  She is on 500 mg of Metformin twice a day Diabetes not reviewed in detail today  Mild hyponatremia: Appears to be from spironolactone   Now with stopping this sodium is almost normal and no change in blood pressure  PLAN:   Continue to leave off spironolactone and monitor blood pressure regularly at home  As discussed previously she will have Reclast every 2 years for osteopenia  Follow-up in  4 months     Fatina Sprankle 11/21/2022, 11:48 AM

## 2022-11-21 NOTE — Telephone Encounter (Signed)
Patient states unable to afford this medication.  Tried before and could not get assistance.  Fish oil was prescribed several years, in its place,  but could not remember the dosage.  Please advise

## 2022-11-21 NOTE — Telephone Encounter (Signed)
Pt c/o medication issue:  1. Name of Medication: omega-3 acid ethyl esters (LOVAZA) 1 g capsule   2. How are you currently taking this medication (dosage and times per day)?   3. Are you having a reaction (difficulty breathing--STAT)?   4. What is your medication issue? Patient states she needs call back to discuss medication that she says will cost her over $80 to get. She would like to discuss this further.

## 2022-12-02 DIAGNOSIS — H353122 Nonexudative age-related macular degeneration, left eye, intermediate dry stage: Secondary | ICD-10-CM | POA: Diagnosis not present

## 2022-12-04 DIAGNOSIS — L298 Other pruritus: Secondary | ICD-10-CM | POA: Diagnosis not present

## 2022-12-04 DIAGNOSIS — L538 Other specified erythematous conditions: Secondary | ICD-10-CM | POA: Diagnosis not present

## 2022-12-04 DIAGNOSIS — L820 Inflamed seborrheic keratosis: Secondary | ICD-10-CM | POA: Diagnosis not present

## 2022-12-04 DIAGNOSIS — L905 Scar conditions and fibrosis of skin: Secondary | ICD-10-CM | POA: Diagnosis not present

## 2022-12-05 DIAGNOSIS — M25571 Pain in right ankle and joints of right foot: Secondary | ICD-10-CM | POA: Diagnosis not present

## 2022-12-05 DIAGNOSIS — M542 Cervicalgia: Secondary | ICD-10-CM | POA: Diagnosis not present

## 2022-12-11 ENCOUNTER — Other Ambulatory Visit: Payer: Self-pay | Admitting: Cardiovascular Disease

## 2022-12-19 DIAGNOSIS — M25571 Pain in right ankle and joints of right foot: Secondary | ICD-10-CM | POA: Diagnosis not present

## 2022-12-19 DIAGNOSIS — M542 Cervicalgia: Secondary | ICD-10-CM | POA: Diagnosis not present

## 2022-12-23 NOTE — Telephone Encounter (Signed)
Pt c/o medication issue:  1. Name of Medication:   VASCEPA 1 g capsule   2. How are you currently taking this medication (dosage and times per day)?   As prescribed  3. Are you having a reaction (difficulty breathing--STAT)?   4. What is your medication issue?   Patient stated this medication has become very expensive.  Patient wants to get a more affordable option.

## 2022-12-23 NOTE — Telephone Encounter (Signed)
Pt states she is in the "donut whole" and her rx is now $91 and she can not afford this. She wants to know if she can get some meds through a [employer] foundation? She wants to speak to the pharmacist she spoke to before.

## 2022-12-24 NOTE — Telephone Encounter (Signed)
Spoke with patient - in coverage gap and cannot afford Vascepa.  Will use Nature's Bounty OTC until 2025 or [facility] becomes available.

## 2022-12-26 DIAGNOSIS — H43813 Vitreous degeneration, bilateral: Secondary | ICD-10-CM | POA: Diagnosis not present

## 2022-12-26 DIAGNOSIS — H353211 Exudative age-related macular degeneration, right eye, with active choroidal neovascularization: Secondary | ICD-10-CM | POA: Diagnosis not present

## 2023-01-01 DIAGNOSIS — H353122 Nonexudative age-related macular degeneration, left eye, intermediate dry stage: Secondary | ICD-10-CM | POA: Diagnosis not present

## 2023-01-14 ENCOUNTER — Encounter: Payer: Self-pay | Admitting: Pharmacist

## 2023-01-14 NOTE — Telephone Encounter (Signed)
Spoke with patient, she is aware grant still active.

## 2023-01-14 NOTE — Telephone Encounter (Signed)
This encounter was created in error - please disregard.

## 2023-01-31 ENCOUNTER — Other Ambulatory Visit: Payer: Self-pay | Admitting: Cardiovascular Disease

## 2023-01-31 DIAGNOSIS — H353122 Nonexudative age-related macular degeneration, left eye, intermediate dry stage: Secondary | ICD-10-CM | POA: Diagnosis not present

## 2023-02-09 DIAGNOSIS — Z1231 Encounter for screening mammogram for malignant neoplasm of breast: Secondary | ICD-10-CM | POA: Diagnosis not present

## 2023-02-26 DIAGNOSIS — L2989 Other pruritus: Secondary | ICD-10-CM | POA: Diagnosis not present

## 2023-02-26 DIAGNOSIS — L538 Other specified erythematous conditions: Secondary | ICD-10-CM | POA: Diagnosis not present

## 2023-02-26 DIAGNOSIS — L820 Inflamed seborrheic keratosis: Secondary | ICD-10-CM | POA: Diagnosis not present

## 2023-02-26 DIAGNOSIS — L650 Telogen effluvium: Secondary | ICD-10-CM | POA: Diagnosis not present

## 2023-03-02 DIAGNOSIS — H353122 Nonexudative age-related macular degeneration, left eye, intermediate dry stage: Secondary | ICD-10-CM | POA: Diagnosis not present

## 2023-03-04 DIAGNOSIS — E785 Hyperlipidemia, unspecified: Secondary | ICD-10-CM | POA: Diagnosis not present

## 2023-03-04 DIAGNOSIS — I2510 Atherosclerotic heart disease of native coronary artery without angina pectoris: Secondary | ICD-10-CM | POA: Diagnosis not present

## 2023-03-04 DIAGNOSIS — I10 Essential (primary) hypertension: Secondary | ICD-10-CM | POA: Diagnosis not present

## 2023-03-04 DIAGNOSIS — I4510 Unspecified right bundle-branch block: Secondary | ICD-10-CM | POA: Diagnosis not present

## 2023-03-04 LAB — HEPATIC FUNCTION PANEL
ALT: 19 [IU]/L (ref 0–32)
AST: 17 [IU]/L (ref 0–40)
Albumin: 4.1 g/dL (ref 3.8–4.8)
Alkaline Phosphatase: 62 [IU]/L (ref 44–121)
Bilirubin Total: 0.4 mg/dL (ref 0.0–1.2)
Bilirubin, Direct: 0.12 mg/dL (ref 0.00–0.40)
Total Protein: 6.6 g/dL (ref 6.0–8.5)

## 2023-03-04 LAB — LIPID PANEL
Chol/HDL Ratio: 4.3 {ratio} (ref 0.0–4.4)
Cholesterol, Total: 214 mg/dL — ABNORMAL HIGH (ref 100–199)
HDL: 50 mg/dL (ref >39)
LDL Chol Calc (NIH): 111 mg/dL — ABNORMAL HIGH (ref 0–99)
Triglycerides: 311 mg/dL — ABNORMAL HIGH (ref 0–149)
VLDL Cholesterol Cal: 53 mg/dL — ABNORMAL HIGH (ref 5–40)

## 2023-03-05 ENCOUNTER — Telehealth: Payer: Self-pay | Admitting: Pharmacist Clinician (PhC)/ Clinical Pharmacy Specialist

## 2023-03-05 NOTE — Telephone Encounter (Signed)
Patient called to follow-up on test results and wants to know next steps.  Patient stated she  wants a copy of her test results mailed to her.

## 2023-03-06 DIAGNOSIS — H2513 Age-related nuclear cataract, bilateral: Secondary | ICD-10-CM | POA: Diagnosis not present

## 2023-03-06 DIAGNOSIS — H35033 Hypertensive retinopathy, bilateral: Secondary | ICD-10-CM | POA: Diagnosis not present

## 2023-03-06 DIAGNOSIS — H43813 Vitreous degeneration, bilateral: Secondary | ICD-10-CM | POA: Diagnosis not present

## 2023-03-06 DIAGNOSIS — H353211 Exudative age-related macular degeneration, right eye, with active choroidal neovascularization: Secondary | ICD-10-CM | POA: Diagnosis not present

## 2023-03-06 DIAGNOSIS — H353122 Nonexudative age-related macular degeneration, left eye, intermediate dry stage: Secondary | ICD-10-CM | POA: Diagnosis not present

## 2023-03-09 ENCOUNTER — Encounter: Payer: Self-pay | Admitting: *Deleted

## 2023-03-09 ENCOUNTER — Telehealth: Payer: Self-pay | Admitting: Cardiovascular Disease

## 2023-03-09 NOTE — Telephone Encounter (Signed)
  Pt c/o medication issue:  1. Name of Medication: VASCEPA 1 g capsule   2. How are you currently taking this medication (dosage and times per day)?   Take 1 capsule (1 g total) by mouth 2 (two) times daily.    3. Are you having a reaction (difficulty breathing--STAT)? No   4. What is your medication issue? Pt is calling back, she said, her triglycerides is at 311 and she is very concern about it. She said, she's been taking her vascepa everyday and seems like it is not working to lower down her  triglycerides. She would like to get a call back from the nurse today.

## 2023-03-09 NOTE — Telephone Encounter (Signed)
Left message for pt to call.

## 2023-03-09 NOTE — Telephone Encounter (Signed)
Spoke with pt, she is just concerned because her numbers do not look like they are moving. Medications confirmed. She does report not really watching her diet. Encouraged patient to watch sugars and carbs. Aware will forward to pharmacist to see if they have any other recommendations.

## 2023-03-09 NOTE — Telephone Encounter (Signed)
Multiple encounters open. Closing this encounter. 

## 2023-03-09 NOTE — Telephone Encounter (Signed)
Pt returning call

## 2023-03-10 NOTE — Telephone Encounter (Signed)
The Vascepa is the fish oil which Thayer Ohm advised to increase the dose of to 2g BID.

## 2023-03-10 NOTE — Telephone Encounter (Signed)
Spoke with pt, aware of the recommendations. She reports she has a grant or something with the repatha. She questions if she continues to take the fish oil twice daily or does she need to increase that as well. Aware will send to the pharmacist.

## 2023-03-10 NOTE — Telephone Encounter (Signed)
Recommend increasing Vascepa to 2g twice daily with food and watching cabrohydrates and sugars

## 2023-03-13 MED ORDER — ICOSAPENT ETHYL 1 G PO CAPS: 2.0000 g | ORAL_CAPSULE | Freq: Two times a day (BID) | ORAL | 3 refills | Status: DC

## 2023-03-13 NOTE — Telephone Encounter (Signed)
Discussed with the pharm d, patient called and told to stop the OTC fish oil she is taking if she is going to get the vascepa.

## 2023-03-18 ENCOUNTER — Other Ambulatory Visit: Payer: Self-pay

## 2023-03-18 DIAGNOSIS — E871 Hypo-osmolality and hyponatremia: Secondary | ICD-10-CM

## 2023-03-18 DIAGNOSIS — R7303 Prediabetes: Secondary | ICD-10-CM

## 2023-03-18 DIAGNOSIS — E039 Hypothyroidism, unspecified: Secondary | ICD-10-CM

## 2023-03-23 ENCOUNTER — Other Ambulatory Visit (INDEPENDENT_AMBULATORY_CARE_PROVIDER_SITE_OTHER): Payer: Medicare Other

## 2023-03-23 DIAGNOSIS — E039 Hypothyroidism, unspecified: Secondary | ICD-10-CM

## 2023-03-23 DIAGNOSIS — E871 Hypo-osmolality and hyponatremia: Secondary | ICD-10-CM

## 2023-03-23 DIAGNOSIS — R7303 Prediabetes: Secondary | ICD-10-CM | POA: Diagnosis not present

## 2023-03-23 LAB — BASIC METABOLIC PANEL
BUN: 9 mg/dL (ref 6–23)
CO2: 26 meq/L (ref 19–32)
Calcium: 9.6 mg/dL (ref 8.4–10.5)
Chloride: 100 meq/L (ref 96–112)
Creatinine, Ser: 0.64 mg/dL (ref 0.40–1.20)
GFR: 83.63 mL/min (ref >60.00)
Glucose, Bld: 120 mg/dL — ABNORMAL HIGH (ref 70–99)
Potassium: 3.7 meq/L (ref 3.5–5.1)
Sodium: 135 meq/L (ref 135–145)

## 2023-03-23 LAB — TSH: TSH: 3.18 u[IU]/mL (ref 0.35–5.50)

## 2023-03-23 LAB — HEMOGLOBIN A1C: Hgb A1c MFr Bld: 6.6 % — ABNORMAL HIGH (ref 4.6–6.5)

## 2023-03-24 ENCOUNTER — Other Ambulatory Visit: Payer: Medicare Other

## 2023-03-27 DIAGNOSIS — R208 Other disturbances of skin sensation: Secondary | ICD-10-CM | POA: Diagnosis not present

## 2023-03-27 DIAGNOSIS — L820 Inflamed seborrheic keratosis: Secondary | ICD-10-CM | POA: Diagnosis not present

## 2023-03-27 DIAGNOSIS — L538 Other specified erythematous conditions: Secondary | ICD-10-CM | POA: Diagnosis not present

## 2023-03-30 ENCOUNTER — Ambulatory Visit: Payer: Medicare Other | Admitting: Endocrinology

## 2023-03-30 ENCOUNTER — Encounter: Payer: Self-pay | Admitting: Endocrinology

## 2023-03-30 VITALS — BP 120/70 | HR 78 | Ht 61.0 in | Wt 135.0 lb

## 2023-03-30 DIAGNOSIS — R7303 Prediabetes: Secondary | ICD-10-CM | POA: Diagnosis not present

## 2023-03-30 DIAGNOSIS — M8580 Other specified disorders of bone density and structure, unspecified site: Secondary | ICD-10-CM

## 2023-03-30 DIAGNOSIS — Z780 Asymptomatic menopausal state: Secondary | ICD-10-CM

## 2023-03-30 DIAGNOSIS — E039 Hypothyroidism, unspecified: Secondary | ICD-10-CM | POA: Diagnosis not present

## 2023-03-30 DIAGNOSIS — Z23 Encounter for immunization: Secondary | ICD-10-CM | POA: Diagnosis not present

## 2023-03-30 DIAGNOSIS — E559 Vitamin D deficiency, unspecified: Secondary | ICD-10-CM

## 2023-03-30 LAB — MICROALBUMIN / CREATININE URINE RATIO
Creatinine,U: 163.7 mg/dL
Microalb Creat Ratio: 1 mg/g (ref 0.0–30.0)
Microalb, Ur: 1.6 mg/dL (ref 0.0–1.9)

## 2023-03-30 NOTE — Progress Notes (Signed)
Outpatient Endocrinology Note [place] Taishaun Levels, MD  03/30/23  Patient's Name: Brittany Figueroa    DOB: 02/13/1941    MRN: [id number]                                                    REASON OF VISIT: Follow up of prediabetes  PCP: Brittany Aspen, MD  HISTORY OF PRESENT ILLNESS:   Brittany Figueroa is a 82 y.o. old female with past medical history listed below, is here for follow up for prediabetes.   Pertinent Diabetes History: Patient has prediabetes 10+ years.  She had hemoglobin A1c in the range of 6%.  In the past she had hemoglobin A1c up to 6.8% however likely related with steroid use per prior endocrine office note.  She was being managed as prediabetes. She has been intermittently on metformin extended release since April 2017.  Current / Home Diabetic regimen includes: Metformin extended release 500 mg 2 tablets daily.  Prior diabetic medications:  Glycemic data:   Blood sugar glucometer download from October 14 to March 29, 2023, 28 days.  Average 97 has been checking a average 0.3 times a day.  Checking mainly in the morning fasting some of the blood sugar 108, 93, 7 0, 92 104, 106, 95.  In the afternoon 106  # OSTEOPENIA:   Lowest T score is at the right neck femur previously -2.4  This is only 0.8 as of 11/14/2019 BMD at the left femoral neck was -2.0 compared to -1.8   Results from 08/14/2022 done on a different machine:    Lumbar spine L1-L2 Femoral neck (FN)    T-score -2.0 RFN: -2.2 LFN: -1.7    Apparently had tried Fosamax in the past and this caused some GI side effects She received RECLAST infusions on 12/15/17, 01/07/2020 and also 6/23.  Plan was to continue Reclast every 2 years for osteopenia.    # Vitamin D deficiency: She is taking OTC vitamin D 4000 U daily.  # HYPOTHYROIDISM: TSH level previously has been as high as 5.6, this spontaneously improved but in 05/2018 was mildly increased again. At that time she was complaining of some fatigue,  constipation, dry skin, some cold intolerance and difficulty losing weight.   She is taking levothyroxine 37.5 mcg daily, she did have some improvement in her fatigue with the higher dose.  Interval history  Patient has been taking metformin 2 tablets daily.  She reports recently she has not been fully compliant and would like to be better.  Recent hemoglobin A1c 6.6%.  She would like to be managed as prediabetes.  We discussed that if her hemoglobin A1c is persistently equal to or more than 6.5 we will manage her as a type 2 diabetes mellitus.  Recent TSH normal.  No hypo and hyperthyroid symptoms.  She reports compliance with levothyroxine.  She has been taking calcium and vitamin D supplement.  Vitamin D level was normal in May 2024.  No other complaints today.   REVIEW OF SYSTEMS As per history of present illness.   PAST MEDICAL HISTORY: Past Medical History:  Diagnosis Date   Abnormal finding on EKG, new anterolateral T-wave inversions  05/14/2012   Abnormal nuclear stress test, with infero-lateral ischemia 05/14/2012   Arthritis    "right thumb; all my fingers" (05/13/2012), cervical spondylosis  Atypical angina (HCC) 05/14/2012   CAD (coronary artery disease), 05/13/12, with 80% LAD 05/14/2012   Cancer (HCC)    basal cell on facial in L temporal region     [facility] arthritis, thumb, degenerative    Complication of anesthesia 12/2009   "had endoscopy; larynx went into spasms; stopped breathing for 15 seconds" (05/13/2012)   GERD (gastroesophageal reflux disease) 2011   Hypercholesteremia    Hypertension    S/P angioplasty with stent, LAD 05/13/12 05/14/2012   Tuberculosis    test +- [place] [facility] - Dr. Ronne Binning- CXR- OK    PAST SURGICAL HISTORY: Past Surgical History:  Procedure Laterality Date   ANTERIOR CERVICAL DECOMP/DISCECTOMY FUSION N/A 12/13/2013   Procedure: ANTERIOR CERVICAL DECOMPRESSION/DISCECTOMY FUSION 3 LEVELS Cervical four/five,five/six,six/seven. Anterior  cervical disectomy and fusion with peek and plate ;  Surgeon: Temple Pacini, MD;  Location: [facility] NEURO ORS;  Service: Neurosurgery;  Laterality: N/A;   CARDIAC CATHETERIZATION N/A 07/02/2015   Procedure: Left Heart Cath and Coronary Angiography;  Surgeon: Runell Gess, MD;  Location: [facility] INVASIVE CV LAB;  Service: Cardiovascular;  Laterality: N/A;   CORONARY ANGIOPLASTY WITH STENT PLACEMENT  05/13/2012   DES to LAD; she has total RCA with left to rt coll. normal LV function done for positive nuc study   DILATION AND CURETTAGE OF UTERUS  1960's; 1970's; 1980   "probably 3" (05/13/2012)   FOREARM / WRIST TENDON LESION EXCISION  ?11/2001   "right; did waver to try to get ulnar out of hole that it had cut" (05/13/2012   INGUINAL HERNIA REPAIR  ~ 1966; 07/21/2002   "right; left" (05/13/2012)   LEFT HEART CATHETERIZATION WITH CORONARY ANGIOGRAM N/A 05/13/2012   Procedure: LEFT HEART CATHETERIZATION WITH CORONARY ANGIOGRAM;  Surgeon: Runell Gess, MD;  Location: [facility] CATH LAB;  Service: Cardiovascular;  Laterality: N/A;   OSTEOTOMY AND ULNAR SHORTENING  07/21/2002   "right" (05/13/2012)   TONSILLECTOMY AND ADENOIDECTOMY  1950's   TUBAL LIGATION  1980    ALLERGIES: Allergies  Allergen Reactions   Statins Other (See Comments)    "intermittent loss of circulation; hands and arms will go to sleep; feet will get cramps; fatigue" (05/13/2012).  This occurred with Lipitor, Zocor, Crestor 5 mg qd, and she thinks pravastatin as well   Amlodipine Itching   Gadolinium Derivatives Itching and Nausea Only    Pt had severe nausea and itching on legs, neck and back, per dr Alfredo Batty pt needs 13 hour prep before contrast in the future    FAMILY HISTORY:  Family History  Problem Relation Age of Onset   Hypertension Mother    Diabetes Mother    Hyperlipidemia Father    Heart disease Father    Stroke Father    Heart disease Sister    Diabetes Sister    Heart disease Brother    Diabetes Maternal Aunt      SOCIAL HISTORY: Social History   Socioeconomic History   Marital status: Divorced    Spouse name: Not on file   Number of children: Not on file   Years of education: Not on file   Highest education level: Not on file  Occupational History   Not on file  Tobacco Use   Smoking status: Former    Current packs/day: 0.00    Average packs/day: 0.5 packs/day for 20.0 years (10.0 ttl pk-yrs)    Types: Cigarettes    Start date: 07/05/1968    Quit date: 07/05/1988    Years since quitting:  34.7   Smokeless tobacco: Never  Substance and Sexual Activity   Alcohol use: Yes    Alcohol/week: 12.0 standard drinks of alcohol    Types: 12 Glasses of wine per week    Comment: 05/13/2012 "6-8 oz glass of red wine q hs"    Drug use: No   Sexual activity: Never  Other Topics Concern   Not on file  Social History Narrative   Not on file   Social Determinants of Health   Financial Resource Strain: Not on file  Food Insecurity: Not on file  Transportation Needs: Not on file  Physical Activity: Not on file  Stress: Not on file  Social Connections: Not on file    MEDICATIONS:  Current Outpatient Medications  Medication Sig Dispense Refill   acetaminophen (TYLENOL) 500 MG tablet Take 500 mg by mouth every 6 (six) hours as needed for mild pain or headache.     aspirin EC (ASPIRIN 81) 81 MG tablet Take 1 tablet (81 mg total) by mouth daily. 90 tablet 3   calcium-vitamin D (OSCAL WITH D) 500-200 MG-UNIT per tablet Take 1 tablet by mouth daily as needed.     carvedilol (COREG) 12.5 MG tablet Take 12.5 mg by mouth 2 (two) times daily with a meal.     Cholecalciferol (VITAMIN D3) 2000 UNITS TABS Take 2,000 Units by mouth 2 (two) times daily.     clopidogrel (PLAVIX) 75 MG tablet TAKE 1 TABLET(75 MG) BY MOUTH DAILY 90 tablet 2   diclofenac sodium (VOLTAREN) 1 % GEL Apply 2 g topically daily as needed. For pain     DILT-XR 240 MG 24 hr capsule Take 240 mg by mouth daily.     diltiazem (DILACOR  XR) 240 MG 24 hr capsule      ezetimibe (ZETIA) 10 MG tablet TAKE 1 TABLET(10 MG) BY MOUTH DAILY 90 tablet 3   icosapent Ethyl (VASCEPA) 1 g capsule Take 2 capsules (2 g total) by mouth 2 (two) times daily. 360 capsule 3   irbesartan (AVAPRO) 300 MG tablet TAKE 1/2 TABLET BY MOUTH TWICE DAILY IN THE MORNING AND AT BEDTIME 90 tablet 3   levothyroxine (SYNTHROID) 25 MCG tablet TAKE 1 AND 1/2 TABLETS BY MOUTH DAILY BEFORE BREAKFAST 135 tablet 1   metFORMIN (GLUCOPHAGE-XR) 500 MG 24 hr tablet TAKE 2 TABLETS BY MOUTH DAILY WITH SUPPER 180 tablet 3   Multiple Vitamin (MULTIVITAMIN WITH MINERALS) TABS Take 1 tablet by mouth daily.     Multiple Vitamins-Minerals (PRESERVISION AREDS 2+MULTI VIT PO) Take 1 tablet by mouth in the morning and at bedtime.     nitroGLYCERIN (NITROSTAT) 0.4 MG SL tablet PLACE 1 TABLET UNDER THE TONGUE EVERY 5 MINUTES AS NEEDED FOR CHEST PAIN 25 tablet 1   REPATHA SURECLICK 140 MG/ML SOAJ INJECT CONTENTS OF ONE PEN UNDER THE SKIN EVERY 14 DAYS 2 mL 11   glucose blood (ONETOUCH VERIO) test strip Use to test blood sugar once daily Dx code E11.9 (Patient not taking: Reported on 03/30/2023) 30 each 4   triamterene-hydrochlorothiazide (MAXZIDE-25) 37.5-25 MG tablet Take 1 tablet by mouth daily. Patient taking  1/4 tablet (Patient not taking: Reported on 03/30/2023)     No current facility-administered medications for this visit.    PHYSICAL EXAM: Vitals:   03/30/23 1047  BP: 120/70  Pulse: 78  SpO2: 97%  Weight: 135 lb (61.2 kg)  Height: 5\' 1"  (1.549 m)   Body mass index is 25.51 kg/m.  Wt Readings from Last 3  Encounters:  03/30/23 135 lb (61.2 kg)  11/21/22 135 lb (61.2 kg)  11/19/22 134 lb (60.8 kg)    General: Well developed, well nourished female in no apparent distress.  HEENT: AT/[place], no external lesions.  Eyes: Conjunctiva clear and no icterus. Neck: Neck supple  Lungs: Respirations not labored Neurologic: Alert, oriented, normal speech Extremities / Skin:  Dry. No sores or rashes noted.  Psychiatric: Does not appear depressed or anxious  Diabetic Foot Exam - Simple   No data filed   LABS Reviewed Lab Results  Component Value Date   HGBA1C 6.6 (H) 03/23/2023   HGBA1C 6.5 10/15/2022   HGBA1C 6.8 (H) 06/16/2022   Lab Results  Component Value Date   FRUCTOSAMINE 242 06/21/2015   Lab Results  Component Value Date   CHOL 214 (H) 03/04/2023   HDL 50 03/04/2023   LDLCALC 111 (H) 03/04/2023   LDLDIRECT 130.1 04/04/2014   TRIG 311 (H) 03/04/2023   CHOLHDL 4.3 03/04/2023   Lab Results  Component Value Date   MICRALBCREAT 1.0 03/30/2023   MICRALBCREAT 1.3 07/17/2020   Lab Results  Component Value Date   CREATININE 0.64 03/23/2023   Lab Results  Component Value Date   GFR 83.63 03/23/2023    ASSESSMENT / PLAN  1. Prediabetes   2. Encounter for immunization   3. Osteopenia after menopause   4. Vitamin D deficiency   5. Acquired hypothyroidism    # Prediabetes -Patient is being managed as prediabetes.  However she had hemoglobin A1c of more than 6.5% in the past per office note was likely related to steroid intake.  Recent hemoglobin A1c 6.6%, patient reports she has not been fully compliant with metformin.  She likes to be very compliant and also diet control and would like to be managed as prediabetes at this time.  Technically she has type 2 diabetes mellitus.  She reports she can be motivated to keep on prediabetes range and would like to a fourth more to get improvement if we manage her as of prediabetes. -Continue metformin extended release 500 mg 2 tablets daily. -Discussed about diet plan. -Will check urine microalbumin creatinine ratio today.  # Osteopenia -She is status post 3 infusion of Reclast with last 1 in June 2023.  She had last DEXA scan in March 2024 consistent with osteopenia. -Continue current calcium and vitamin D supplement. -Continue Reclast every 2 years for osteopenia.  # Hypothyroidism -Recent TSH  normal.  Continue current dose of levothyroxine 37.5 mcg daily.  1 and half tablets of 25 mcg levothyroxine.  Diagnoses and all orders for this visit:  Prediabetes -     Microalbumin / creatinine urine ratio; Future -     Microalbumin / creatinine urine ratio -     Cancel: Basic metabolic panel; Future -     Cancel: Hemoglobin A1c; Future -     Basic metabolic panel; Future -     Hemoglobin A1c; Future  Encounter for immunization -     Flu Vaccine Trivalent High Dose (Fluad)  Osteopenia after menopause  Vitamin D deficiency  Acquired hypothyroidism    DISPOSITION Follow up in clinic in 4 months suggested.   All questions answered and patient verbalized understanding of the plan.  [place] Kashtyn Jankowski, MD [facility] Endocr[facility]ology [facility] [address] [place], [address] [place], [place] [address] Phone # [phone]  At least part of this note was generated using voice recognition software. Inadvertent word errors may have occurred, which were not  recognized during the proofreading process.

## 2023-04-01 DIAGNOSIS — H353122 Nonexudative age-related macular degeneration, left eye, intermediate dry stage: Secondary | ICD-10-CM | POA: Diagnosis not present

## 2023-04-03 DIAGNOSIS — H6121 Impacted cerumen, right ear: Secondary | ICD-10-CM | POA: Diagnosis not present

## 2023-04-13 DIAGNOSIS — H6121 Impacted cerumen, right ear: Secondary | ICD-10-CM | POA: Diagnosis not present

## 2023-04-21 ENCOUNTER — Other Ambulatory Visit: Payer: Self-pay

## 2023-04-21 DIAGNOSIS — K219 Gastro-esophageal reflux disease without esophagitis: Secondary | ICD-10-CM | POA: Diagnosis not present

## 2023-04-21 DIAGNOSIS — H6121 Impacted cerumen, right ear: Secondary | ICD-10-CM | POA: Diagnosis not present

## 2023-04-21 MED ORDER — LEVOTHYROXINE SODIUM 25 MCG PO TABS: ORAL_TABLET | ORAL | 1 refills | Status: DC

## 2023-05-01 DIAGNOSIS — H353122 Nonexudative age-related macular degeneration, left eye, intermediate dry stage: Secondary | ICD-10-CM | POA: Diagnosis not present

## 2023-05-27 ENCOUNTER — Other Ambulatory Visit: Payer: Self-pay | Admitting: Cardiovascular Disease

## 2023-05-31 DIAGNOSIS — H353122 Nonexudative age-related macular degeneration, left eye, intermediate dry stage: Secondary | ICD-10-CM | POA: Diagnosis not present

## 2023-06-09 DIAGNOSIS — H43813 Vitreous degeneration, bilateral: Secondary | ICD-10-CM | POA: Diagnosis not present

## 2023-06-09 DIAGNOSIS — H2513 Age-related nuclear cataract, bilateral: Secondary | ICD-10-CM | POA: Diagnosis not present

## 2023-06-09 DIAGNOSIS — H35033 Hypertensive retinopathy, bilateral: Secondary | ICD-10-CM | POA: Diagnosis not present

## 2023-06-09 DIAGNOSIS — H353122 Nonexudative age-related macular degeneration, left eye, intermediate dry stage: Secondary | ICD-10-CM | POA: Diagnosis not present

## 2023-06-09 DIAGNOSIS — H353211 Exudative age-related macular degeneration, right eye, with active choroidal neovascularization: Secondary | ICD-10-CM | POA: Diagnosis not present

## 2023-06-11 DIAGNOSIS — R7303 Prediabetes: Secondary | ICD-10-CM | POA: Diagnosis not present

## 2023-06-11 DIAGNOSIS — E782 Mixed hyperlipidemia: Secondary | ICD-10-CM | POA: Diagnosis not present

## 2023-06-11 DIAGNOSIS — D473 Essential (hemorrhagic) thrombocythemia: Secondary | ICD-10-CM | POA: Diagnosis not present

## 2023-06-11 DIAGNOSIS — Z0000 Encounter for general adult medical examination without abnormal findings: Secondary | ICD-10-CM | POA: Diagnosis not present

## 2023-06-11 DIAGNOSIS — I10 Essential (primary) hypertension: Secondary | ICD-10-CM | POA: Diagnosis not present

## 2023-06-11 DIAGNOSIS — I25119 Atherosclerotic heart disease of native coronary artery with unspecified angina pectoris: Secondary | ICD-10-CM | POA: Diagnosis not present

## 2023-06-11 DIAGNOSIS — I2510 Atherosclerotic heart disease of native coronary artery without angina pectoris: Secondary | ICD-10-CM | POA: Diagnosis not present

## 2023-06-11 DIAGNOSIS — H353211 Exudative age-related macular degeneration, right eye, with active choroidal neovascularization: Secondary | ICD-10-CM | POA: Diagnosis not present

## 2023-06-11 DIAGNOSIS — E039 Hypothyroidism, unspecified: Secondary | ICD-10-CM | POA: Diagnosis not present

## 2023-06-11 DIAGNOSIS — E559 Vitamin D deficiency, unspecified: Secondary | ICD-10-CM | POA: Diagnosis not present

## 2023-06-11 DIAGNOSIS — E871 Hypo-osmolality and hyponatremia: Secondary | ICD-10-CM | POA: Diagnosis not present

## 2023-06-11 DIAGNOSIS — Z79899 Other long term (current) drug therapy: Secondary | ICD-10-CM | POA: Diagnosis not present

## 2023-06-11 LAB — LAB REPORT - SCANNED: A1c: 6

## 2023-06-19 DIAGNOSIS — I10 Essential (primary) hypertension: Secondary | ICD-10-CM | POA: Diagnosis not present

## 2023-06-22 ENCOUNTER — Telehealth: Payer: Self-pay | Admitting: Cardiovascular Disease

## 2023-06-22 MED ORDER — CLOPIDOGREL BISULFATE 75 MG PO TABS: ORAL_TABLET | ORAL | 1 refills | Status: DC

## 2023-06-22 NOTE — Telephone Encounter (Signed)
*  STAT* If patient is at the pharmacy, call can be transferred to refill team.   1. Which medications need to be refilled? (please list name of each medication and dose if known)   clopidogrel (PLAVIX) 75 MG tablet    4. Which pharmacy/location (including street and city if local pharmacy) is medication to be sent to? [id number] - [place], [place] - [address] AT [facility] [address]     5. Do they need a 30 day or 90 day supply? 90

## 2023-06-22 NOTE — Telephone Encounter (Signed)
Thank you for clarifying that

## 2023-06-22 NOTE — Telephone Encounter (Signed)
 Please address refill

## 2023-06-24 DIAGNOSIS — Z79899 Other long term (current) drug therapy: Secondary | ICD-10-CM | POA: Diagnosis not present

## 2023-06-24 DIAGNOSIS — D473 Essential (hemorrhagic) thrombocythemia: Secondary | ICD-10-CM | POA: Diagnosis not present

## 2023-06-24 DIAGNOSIS — H353211 Exudative age-related macular degeneration, right eye, with active choroidal neovascularization: Secondary | ICD-10-CM | POA: Diagnosis not present

## 2023-06-24 DIAGNOSIS — E782 Mixed hyperlipidemia: Secondary | ICD-10-CM | POA: Diagnosis not present

## 2023-06-24 DIAGNOSIS — E871 Hypo-osmolality and hyponatremia: Secondary | ICD-10-CM | POA: Diagnosis not present

## 2023-06-24 DIAGNOSIS — I25119 Atherosclerotic heart disease of native coronary artery with unspecified angina pectoris: Secondary | ICD-10-CM | POA: Diagnosis not present

## 2023-06-24 DIAGNOSIS — E559 Vitamin D deficiency, unspecified: Secondary | ICD-10-CM | POA: Diagnosis not present

## 2023-06-24 DIAGNOSIS — E039 Hypothyroidism, unspecified: Secondary | ICD-10-CM | POA: Diagnosis not present

## 2023-06-24 DIAGNOSIS — R7303 Prediabetes: Secondary | ICD-10-CM | POA: Diagnosis not present

## 2023-06-24 DIAGNOSIS — I10 Essential (primary) hypertension: Secondary | ICD-10-CM | POA: Diagnosis not present

## 2023-06-24 DIAGNOSIS — I2510 Atherosclerotic heart disease of native coronary artery without angina pectoris: Secondary | ICD-10-CM | POA: Diagnosis not present

## 2023-06-25 IMAGING — DX DG CHEST 2V
2 series · 2 of 2 positions shown · non-contrast
Comparison: Radiograph 03/02/2019, chest CT 03/15/2019

CLINICAL DATA: Cough

EXAM:
CHEST - 2 VIEW

[dg chest 2 view (1 of 2)]
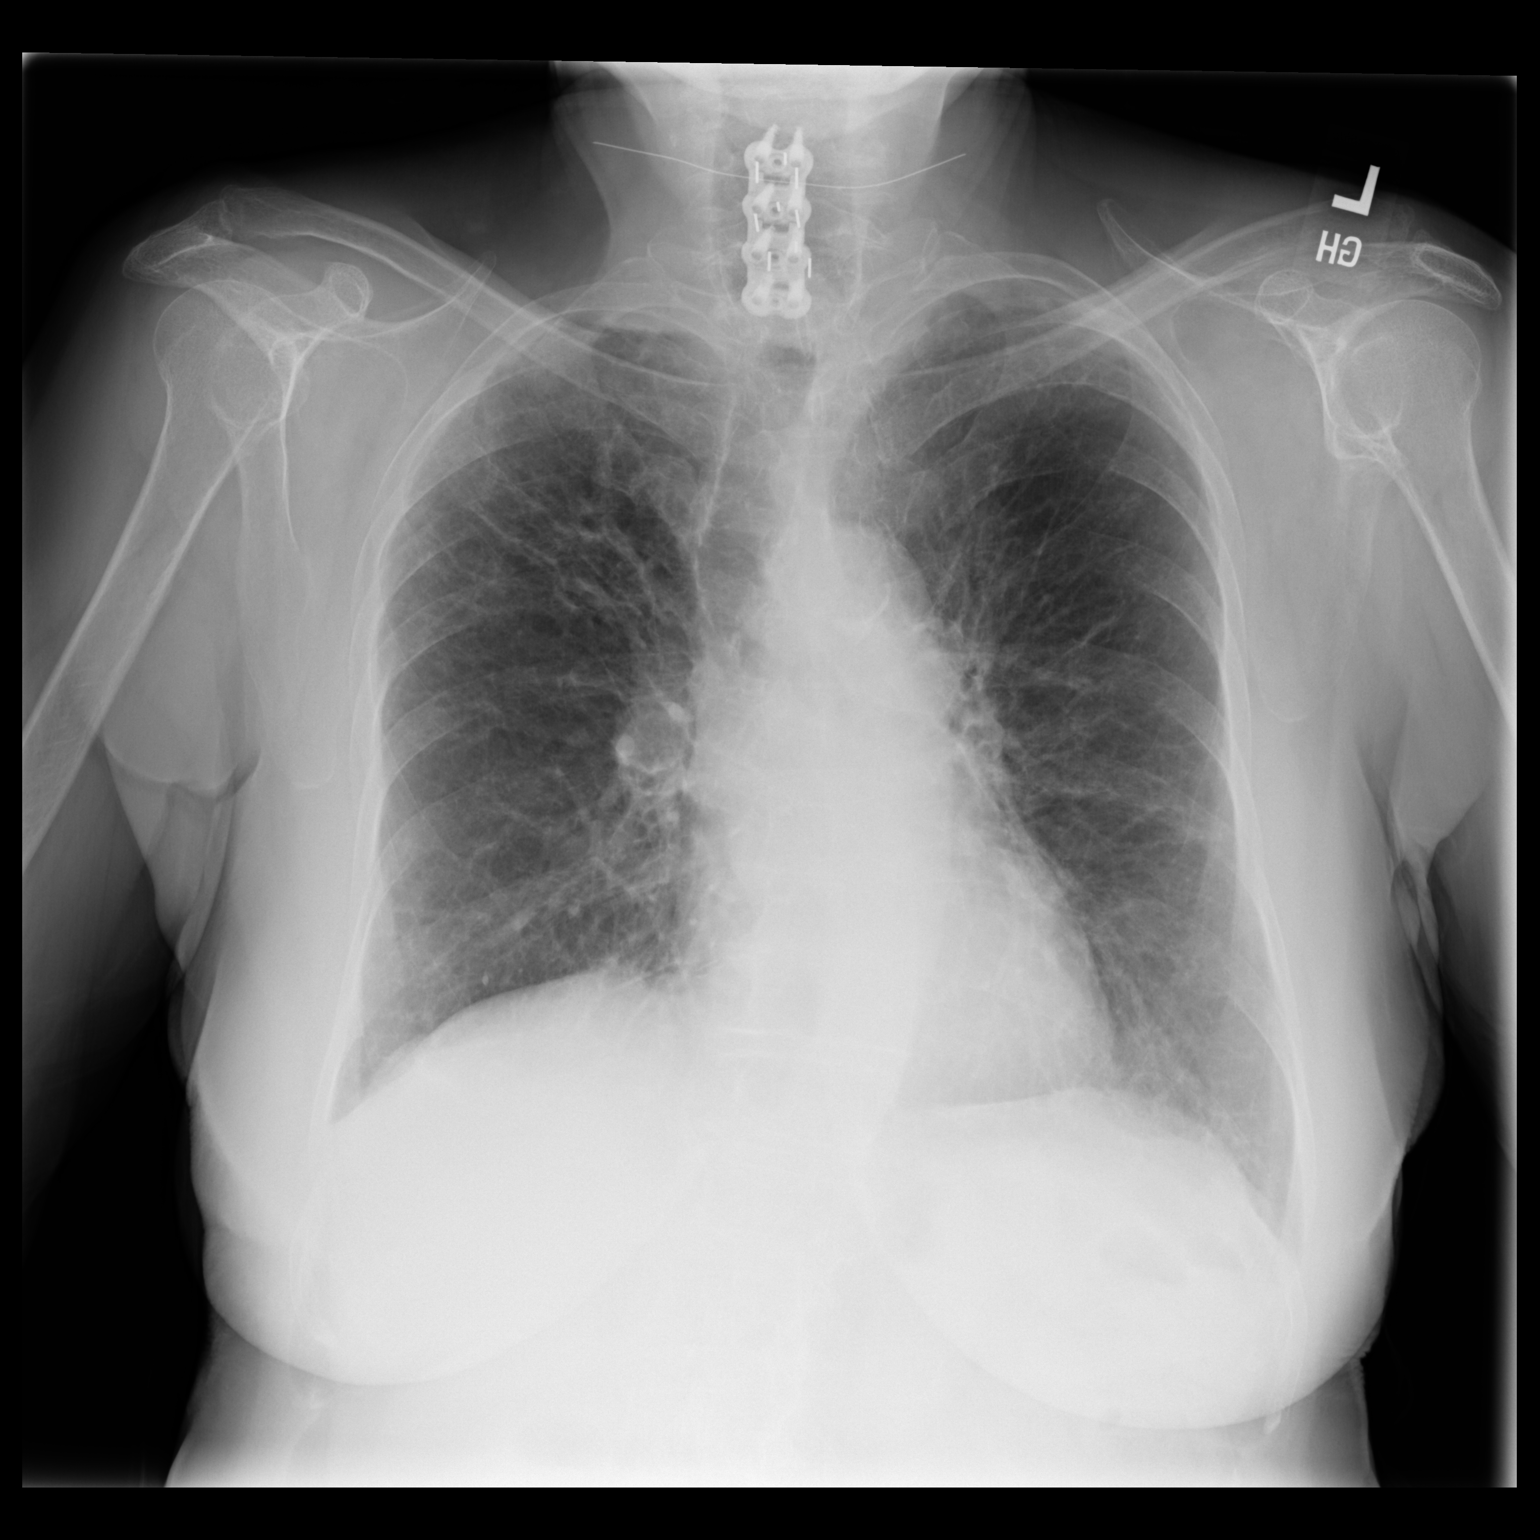

[dg chest 2 view (2 of 2)]
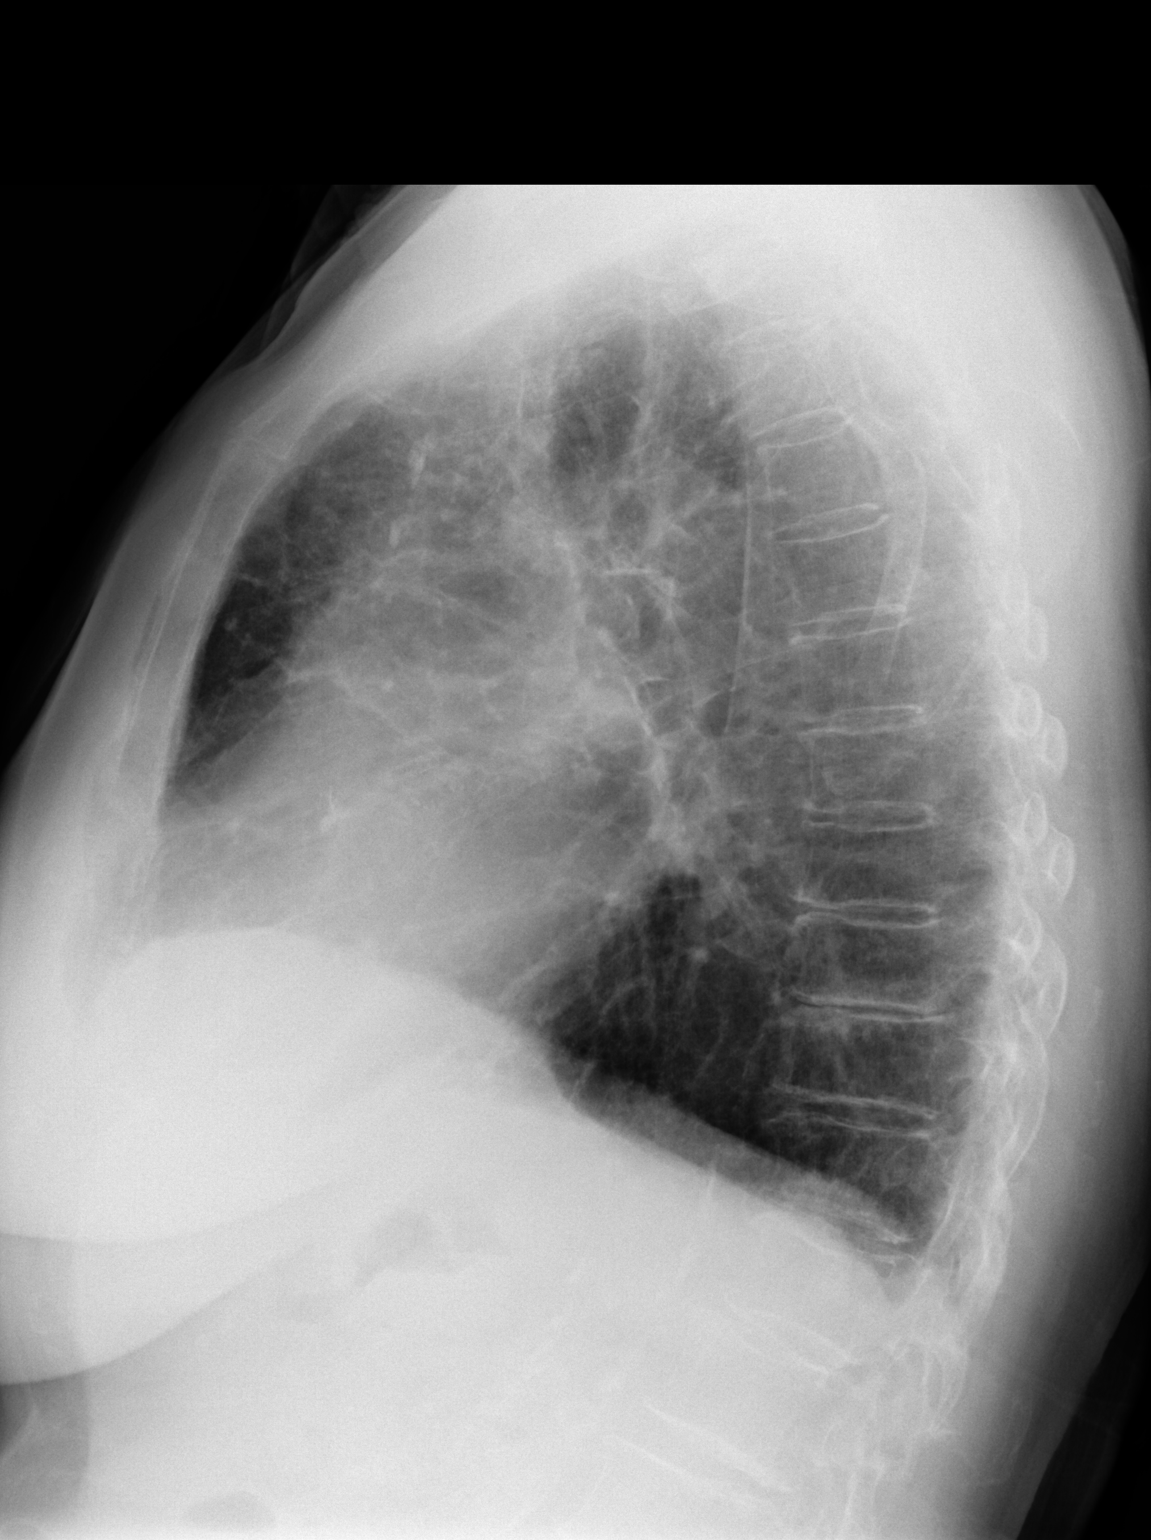

[2 of 2 positions shown; findings below may reference images not displayed]

FINDINGS: Unchanged cardiomediastinal silhouette. Unchanged scattered lung
scarring most prominent in the right upper lung. No focal airspace
consolidation. No pleural effusion. No visible pneumothorax.
Partially visualized cervical spine fusion hardware. Thoracic
spondylosis.
IMPRESSION: Chronic pulmonary parenchymal changes. No evidence of acute
cardiopulmonary disease.

## 2023-06-30 DIAGNOSIS — H353122 Nonexudative age-related macular degeneration, left eye, intermediate dry stage: Secondary | ICD-10-CM | POA: Diagnosis not present

## 2023-07-07 ENCOUNTER — Telehealth: Payer: Self-pay | Admitting: Pharmacy Technician

## 2023-07-07 ENCOUNTER — Other Ambulatory Visit (HOSPITAL_COMMUNITY): Payer: Self-pay

## 2023-07-07 NOTE — Telephone Encounter (Signed)
Pharmacy Patient Advocate Encounter   Received notification from CoverMyMeds that prior authorization for Vascepa BRAND is required/requested.   Insurance verification completed.   The patient is insured through [employer] .   BRAND covered by insurance   Per test claim: The current 07/07/23 day co-pay is, $$47.00- one month.  No PA needed at this time. This test claim was processed through [facility]- copay amounts may vary at other pharmacies due to pharmacy/plan contracts, or as the patient moves through the different stages of their insurance plan.

## 2023-07-13 DIAGNOSIS — M1711 Unilateral primary osteoarthritis, right knee: Secondary | ICD-10-CM | POA: Diagnosis not present

## 2023-07-16 ENCOUNTER — Other Ambulatory Visit: Payer: Self-pay

## 2023-07-16 DIAGNOSIS — R7303 Prediabetes: Secondary | ICD-10-CM

## 2023-07-20 ENCOUNTER — Other Ambulatory Visit: Payer: Self-pay

## 2023-07-20 DIAGNOSIS — E039 Hypothyroidism, unspecified: Secondary | ICD-10-CM

## 2023-07-20 MED ORDER — LEVOTHYROXINE SODIUM 25 MCG PO TABS: ORAL_TABLET | ORAL | 1 refills | Status: DC

## 2023-07-21 ENCOUNTER — Other Ambulatory Visit: Payer: Medicare Other

## 2023-07-21 DIAGNOSIS — R7303 Prediabetes: Secondary | ICD-10-CM | POA: Diagnosis not present

## 2023-07-22 ENCOUNTER — Encounter: Payer: Self-pay | Admitting: Endocrinology

## 2023-07-22 DIAGNOSIS — I25119 Atherosclerotic heart disease of native coronary artery with unspecified angina pectoris: Secondary | ICD-10-CM | POA: Diagnosis not present

## 2023-07-22 DIAGNOSIS — E1159 Type 2 diabetes mellitus with other circulatory complications: Secondary | ICD-10-CM | POA: Diagnosis not present

## 2023-07-22 DIAGNOSIS — I10 Essential (primary) hypertension: Secondary | ICD-10-CM | POA: Diagnosis not present

## 2023-07-22 DIAGNOSIS — E871 Hypo-osmolality and hyponatremia: Secondary | ICD-10-CM | POA: Diagnosis not present

## 2023-07-22 DIAGNOSIS — K219 Gastro-esophageal reflux disease without esophagitis: Secondary | ICD-10-CM | POA: Diagnosis not present

## 2023-07-22 DIAGNOSIS — I2510 Atherosclerotic heart disease of native coronary artery without angina pectoris: Secondary | ICD-10-CM | POA: Diagnosis not present

## 2023-07-22 DIAGNOSIS — D473 Essential (hemorrhagic) thrombocythemia: Secondary | ICD-10-CM | POA: Diagnosis not present

## 2023-07-22 LAB — HEMOGLOBIN A1C
Hgb A1c MFr Bld: 6.3 %{Hb} — ABNORMAL HIGH (ref <5.7)
Mean Plasma Glucose: 134 mg/dL
eAG (mmol/L): 7.4 mmol/L

## 2023-07-22 LAB — BASIC METABOLIC PANEL
BUN: 20 mg/dL (ref 7–25)
CO2: 24 mmol/L (ref 20–32)
Calcium: 10.3 mg/dL (ref 8.6–10.4)
Chloride: 94 mmol/L — ABNORMAL LOW (ref 98–110)
Creat: 0.65 mg/dL (ref 0.60–0.95)
Glucose, Bld: 117 mg/dL — ABNORMAL HIGH (ref 65–99)
Potassium: 4.8 mmol/L (ref 3.5–5.3)
Sodium: 127 mmol/L — ABNORMAL LOW (ref 135–146)

## 2023-07-28 ENCOUNTER — Encounter: Payer: Self-pay | Admitting: Endocrinology

## 2023-07-28 ENCOUNTER — Ambulatory Visit: Payer: Medicare Other | Admitting: Endocrinology

## 2023-07-28 VITALS — BP 138/98 | HR 61 | Resp 20 | Ht 61.0 in | Wt 135.0 lb

## 2023-07-28 DIAGNOSIS — Z780 Asymptomatic menopausal state: Secondary | ICD-10-CM | POA: Diagnosis not present

## 2023-07-28 DIAGNOSIS — E559 Vitamin D deficiency, unspecified: Secondary | ICD-10-CM | POA: Diagnosis not present

## 2023-07-28 DIAGNOSIS — E039 Hypothyroidism, unspecified: Secondary | ICD-10-CM | POA: Diagnosis not present

## 2023-07-28 DIAGNOSIS — M8580 Other specified disorders of bone density and structure, unspecified site: Secondary | ICD-10-CM | POA: Diagnosis not present

## 2023-07-28 DIAGNOSIS — R7303 Prediabetes: Secondary | ICD-10-CM

## 2023-07-28 NOTE — Patient Instructions (Signed)
 Okay to be off of metformin for now.  Check blood sugar in the morning fasting daily and occasionally at bedtime.  Your blood sugar in the morning fasting 150 or less and at bedtime 200 or less should be okay.  If the blood sugar are more than these level please call our clinic.  Blood sugar 200 or less after 2 hours of eating is also okay.

## 2023-07-28 NOTE — Progress Notes (Signed)
 Outpatient Endocrinology Note [place] Deanie Jupiter, MD  07/28/23  Patient's Name: Brittany Figueroa    DOB: 1940-11-22    MRN: [id number]                                                    REASON OF VISIT: Follow up of prediabetes /hypothyroidism  PCP: Emilio Aspen, MD  HISTORY OF PRESENT ILLNESS:   Brittany Figueroa is a 83 y.o. old female with past medical history listed below, is here for follow up for prediabetes / hypothyroidism Noralee Stain.  Pertinent Diabetes History: Patient has prediabetes 10+ years.  She had hemoglobin A1c in the range of 6%.  In the past she had hemoglobin A1c up to 6.8% however likely related with steroid use per prior endocrine office note.  She was being managed as prediabetes. She has been intermittently on metformin extended release since April 2017.  Current / Home Diabetic regimen includes:  None  Prior diabetic medications:  Glycemic data:   Freestyle libre glucometer download from January 11 to July 28, 2023 last 60 days average blood sugar 106.  Lowest blood sugar 96.  Highest blood sugar 126.  She has been checking intermittently few times a week.  Blood sugar this morning 126, yesterday 94, other fasting blood sugar 107, 106, 106 in last 1 week.  # OSTEOPENIA:   Lowest T score is at the right neck femur previously -2.4  This is only 0.8 as of 11/14/2019 BMD at the left femoral neck was -2.0 compared to -1.8   Results from 08/14/2022 done on a different machine:    Lumbar spine L1-L2 Femoral neck (FN)    T-score -2.0 RFN: -2.2 LFN: -1.7    Apparently had tried Fosamax in the past and this caused some GI side effects She received RECLAST infusions on 12/15/17, 01/07/2020 and also 6/23.  Plan was to continue Reclast every 2 years for osteopenia.    # Vitamin D deficiency: She is taking OTC vitamin D 4000 U daily.  # HYPOTHYROIDISM: TSH level previously has been as high as 5.6, this spontaneously improved but in 05/2018 was mildly increased again.  At that time she was complaining of some fatigue, constipation, dry skin, some cold intolerance and difficulty losing weight.   She is taking levothyroxine 37.5 mcg daily, she did have some improvement in her fatigue with the higher dose.  Interval history  Patient reports metformin was stopped on March 5 after she has worsening acid reflux problem.  Glucometer data as reviewed above.  Mostly acceptable blood sugar in the morning fasting, this morning 126.  She reports improvement on GERD symptoms after not taking metformin.  She was taking metformin extended release 1000 mg at bedtime. Recent hemoglobin A1c was 6.3%.   She has been taking levothyroxine 37.5 Mg daily.  Denies hypo or hyperthyroid symptoms.  No palpitation.  He has been taking calcium and vitamin D supplement.  She takes vitamin D occasionally not every day.  She has no fall and fracture.  No other complaints today.   REVIEW OF SYSTEMS As per history of present illness.   PAST MEDICAL HISTORY: Past Medical History:  Diagnosis Date   Abnormal finding on EKG, new anterolateral T-wave inversions  05/14/2012   Abnormal nuclear stress test, with infero-lateral ischemia 05/14/2012   Arthritis    "  right thumb; all my fingers" (05/13/2012), cervical spondylosis    Atypical angina (HCC) 05/14/2012   CAD (coronary artery disease), 05/13/12, with 80% LAD 05/14/2012   Cancer (HCC)    basal cell on facial in L temporal region     [facility] arthritis, thumb, degenerative    Complication of anesthesia 12/2009   "had endoscopy; larynx went into spasms; stopped breathing for 15 seconds" (05/13/2012)   GERD (gastroesophageal reflux disease) 2011   Hypercholesteremia    Hypertension    S/P angioplasty with stent, LAD 05/13/12 05/14/2012   Tuberculosis    test +- [place] [facility] - Dr. Ronne Binning- CXR- OK    PAST SURGICAL HISTORY: Past Surgical History:  Procedure Laterality Date   ANTERIOR CERVICAL DECOMP/DISCECTOMY FUSION N/A 12/13/2013    Procedure: ANTERIOR CERVICAL DECOMPRESSION/DISCECTOMY FUSION 3 LEVELS Cervical four/five,five/six,six/seven. Anterior cervical disectomy and fusion with peek and plate ;  Surgeon: Temple Pacini, MD;  Location: [facility] NEURO ORS;  Service: Neurosurgery;  Laterality: N/A;   CARDIAC CATHETERIZATION N/A 07/02/2015   Procedure: Left Heart Cath and Coronary Angiography;  Surgeon: Runell Gess, MD;  Location: [facility] INVASIVE CV LAB;  Service: Cardiovascular;  Laterality: N/A;   CORONARY ANGIOPLASTY WITH STENT PLACEMENT  05/13/2012   DES to LAD; she has total RCA with left to rt coll. normal LV function done for positive nuc study   DILATION AND CURETTAGE OF UTERUS  1960's; 1970's; 1980   "probably 3" (05/13/2012)   FOREARM / WRIST TENDON LESION EXCISION  ?11/2001   "right; did waver to try to get ulnar out of hole that it had cut" (05/13/2012   INGUINAL HERNIA REPAIR  ~ 1966; 07/21/2002   "right; left" (05/13/2012)   LEFT HEART CATHETERIZATION WITH CORONARY ANGIOGRAM N/A 05/13/2012   Procedure: LEFT HEART CATHETERIZATION WITH CORONARY ANGIOGRAM;  Surgeon: Runell Gess, MD;  Location: [facility] CATH LAB;  Service: Cardiovascular;  Laterality: N/A;   OSTEOTOMY AND ULNAR SHORTENING  07/21/2002   "right" (05/13/2012)   TONSILLECTOMY AND ADENOIDECTOMY  1950's   TUBAL LIGATION  1980    ALLERGIES: Allergies  Allergen Reactions   Statins Other (See Comments)    "intermittent loss of circulation; hands and arms will go to sleep; feet will get cramps; fatigue" (05/13/2012).  This occurred with Lipitor, Zocor, Crestor 5 mg qd, and she thinks pravastatin as well   Amlodipine Itching   Gadolinium Derivatives Itching and Nausea Only    Pt had severe nausea and itching on legs, neck and back, per dr Alfredo Batty pt needs 13 hour prep before contrast in the future    FAMILY HISTORY:  Family History  Problem Relation Age of Onset   Hypertension Mother    Diabetes Mother    Hyperlipidemia Father    Heart disease Father     Stroke Father    Heart disease Sister    Diabetes Sister    Heart disease Brother    Diabetes Maternal Aunt     SOCIAL HISTORY: Social History   Socioeconomic History   Marital status: Divorced    Spouse name: Not on file   Number of children: Not on file   Years of education: Not on file   Highest education level: Not on file  Occupational History   Not on file  Tobacco Use   Smoking status: Former    Current packs/day: 0.00    Average packs/day: 0.5 packs/day for 20.0 years (10.0 ttl pk-yrs)    Types: Cigarettes    Start date: 07/05/1968  Quit date: 07/05/1988    Years since quitting: 35.0   Smokeless tobacco: Never  Substance and Sexual Activity   Alcohol use: Yes    Alcohol/week: 12.0 standard drinks of alcohol    Types: 12 Glasses of wine per week    Comment: 05/13/2012 "6-8 oz glass of red wine q hs"    Drug use: No   Sexual activity: Never  Other Topics Concern   Not on file  Social History Narrative   Not on file   Social Drivers of Health   Financial Resource Strain: Not on file  Food Insecurity: Not on file  Transportation Needs: Not on file  Physical Activity: Not on file  Stress: Not on file  Social Connections: Not on file    MEDICATIONS:  Current Outpatient Medications  Medication Sig Dispense Refill   acetaminophen (TYLENOL) 500 MG tablet Take 500 mg by mouth every 6 (six) hours as needed for mild pain or headache.     aspirin EC (ASPIRIN 81) 81 MG tablet Take 1 tablet (81 mg total) by mouth daily. 90 tablet 3   calcium-vitamin D (OSCAL WITH D) 500-200 MG-UNIT per tablet Take 1 tablet by mouth daily as needed.     carvedilol (COREG) 12.5 MG tablet Take 12.5 mg by mouth 2 (two) times daily with a meal.     Cholecalciferol (VITAMIN D3) 2000 UNITS TABS Take 2,000 Units by mouth 2 (two) times daily.     clopidogrel (PLAVIX) 75 MG tablet TAKE 1 TABLET(75 MG) BY MOUTH DAILY 90 tablet 1   diclofenac sodium (VOLTAREN) 1 % GEL Apply 2 g topically daily  as needed. For pain     DILT-XR 240 MG 24 hr capsule Take 240 mg by mouth daily.     diltiazem (DILACOR XR) 240 MG 24 hr capsule      ezetimibe (ZETIA) 10 MG tablet TAKE 1 TABLET(10 MG) BY MOUTH DAILY 90 tablet 2   glucose blood (ONETOUCH VERIO) test strip Use to test blood sugar once daily Dx code E11.9 30 each 4   icosapent Ethyl (VASCEPA) 1 g capsule Take 2 capsules (2 g total) by mouth 2 (two) times daily. 360 capsule 3   irbesartan (AVAPRO) 300 MG tablet TAKE 1/2 TABLET BY MOUTH TWICE DAILY IN THE MORNING AND AT BEDTIME 90 tablet 3   levothyroxine (SYNTHROID) 25 MCG tablet TAKE 1 AND 1/2 TABLETS BY MOUTH DAILY BEFORE BREAKFAST 135 tablet 1   Multiple Vitamin (MULTIVITAMIN WITH MINERALS) TABS Take 1 tablet by mouth daily.     Multiple Vitamins-Minerals (PRESERVISION AREDS 2+MULTI VIT PO) Take 1 tablet by mouth in the morning and at bedtime.     nitroGLYCERIN (NITROSTAT) 0.4 MG SL tablet PLACE 1 TABLET UNDER THE TONGUE EVERY 5 MINUTES AS NEEDED FOR CHEST PAIN 25 tablet 1   REPATHA SURECLICK 140 MG/ML SOAJ INJECT CONTENTS OF ONE PEN UNDER THE SKIN EVERY 14 DAYS 2 mL 11   triamterene-hydrochlorothiazide (MAXZIDE-25) 37.5-25 MG tablet Take 1 tablet by mouth daily. Patient taking  1/4 tablet     metFORMIN (GLUCOPHAGE-XR) 500 MG 24 hr tablet TAKE 2 TABLETS BY MOUTH DAILY WITH SUPPER (Patient not taking: Reported on 07/28/2023) 180 tablet 3   No current facility-administered medications for this visit.    PHYSICAL EXAM: Vitals:   07/28/23 1054  BP: (!) 138/98  Pulse: 61  Resp: 20  SpO2: 98%  Weight: 135 lb (61.2 kg)  Height: 5\' 1"  (1.549 m)   Body mass index is 25.51  kg/m.  Wt Readings from Last 3 Encounters:  07/28/23 135 lb (61.2 kg)  03/30/23 135 lb (61.2 kg)  11/21/22 135 lb (61.2 kg)    General: Well developed, well nourished female in no apparent distress.  HEENT: AT/, no external lesions.  Eyes: Conjunctiva clear and no icterus. Neck: Neck supple  Lungs: Respirations not  labored Neurologic: Alert, oriented, normal speech Extremities / Skin: Dry. No sores or rashes noted.  Psychiatric: Does not appear depressed or anxious  Diabetic Foot Exam - Simple   No data filed   LABS Reviewed Lab Results  Component Value Date   HGBA1C 6.3 (H) 07/21/2023   HGBA1C 6.6 (H) 03/23/2023   HGBA1C 6.5 10/15/2022   Lab Results  Component Value Date   FRUCTOSAMINE 242 06/21/2015   Lab Results  Component Value Date   CHOL 214 (H) 03/04/2023   HDL 50 03/04/2023   LDLCALC 111 (H) 03/04/2023   LDLDIRECT 130.1 04/04/2014   TRIG 311 (H) 03/04/2023   CHOLHDL 4.3 03/04/2023   Lab Results  Component Value Date   MICRALBCREAT 1.0 03/30/2023   MICRALBCREAT 1.3 07/17/2020   Lab Results  Component Value Date   CREATININE 0.65 07/21/2023   Lab Results  Component Value Date   GFR 83.63 03/23/2023    ASSESSMENT / PLAN  1. Acquired hypothyroidism   2. Osteopenia after menopause   3. Vitamin D deficiency   4. Prediabetes     # Prediabetes -Patient is being managed as prediabetes.  However she had hemoglobin A1c of more than 6.5% in the past per office note was likely related to steroid intake.  In 03/2023 hemoglobin A1c 6.6%, patient reports she has not been fully compliant with metformin.  She likes to be very compliant and also diet control and would like to be managed as prediabetes at this time.  Technically she has type 2 diabetes mellitus.  She reports she can be motivated to keep on prediabetes range and would like to a fourth more to get improvement if we manage her as of prediabetes. -Metformin was recently stopped by PCP due to GERD symptoms.  Okay to be off of metformin for now.  Check blood sugar in the morning fasting daily and occasionally at bedtime.  Your blood sugar in the morning fasting 150 or less and at bedtime 200 or less should be okay.  If the blood sugar are more than these level please call our clinic.  Blood sugar 200 or less after 2  hours of eating is also okay.   # Osteopenia -She is status post 3 infusion of Reclast with last 1 in June 2023.  She had last DEXA scan in March 2024 consistent with osteopenia. -Continue current calcium and vitamin D supplement. -Continue Reclast every 2 years for osteopenia, will plan next Reclast infusion in July 2025.  # Hypothyroidism -Continue current dose of levothyroxine 37.5 mcg daily.  1 and half tablets of 25 mcg levothyroxine.  Diagnoses and all orders for this visit:  Acquired hypothyroidism -     T4, free -     TSH  Osteopenia after menopause  Vitamin D deficiency -     VITAMIN D 25 Hydroxy (Vit-D Deficiency, Fractures)  Prediabetes     DISPOSITION Follow up in clinic in 3 months suggested.  Labs prior to follow-up visit as ordered.   All questions answered and patient verbalized understanding of the plan.  [place] Journe Hallmark, MD [facility] Endocrinology [facility] [address] [place],  [address] [place], [place] [address] Phone # [phone]  At least part of this note was generated using voice recognition software. Inadvertent word errors may have occurred, which were not recognized during the proofreading process.

## 2023-07-29 ENCOUNTER — Emergency Department (HOSPITAL_COMMUNITY)

## 2023-07-29 ENCOUNTER — Observation Stay (HOSPITAL_BASED_OUTPATIENT_CLINIC_OR_DEPARTMENT_OTHER)
Admission: EM | Admit: 2023-07-29 | Discharge: 2023-07-30 | Disposition: A | Discharge status: Home / Self Care | Attending: Internal Medicine | Admitting: Internal Medicine

## 2023-07-29 ENCOUNTER — Encounter (HOSPITAL_BASED_OUTPATIENT_CLINIC_OR_DEPARTMENT_OTHER): Payer: Self-pay | Admitting: Emergency Medicine

## 2023-07-29 ENCOUNTER — Other Ambulatory Visit: Payer: Self-pay

## 2023-07-29 DIAGNOSIS — Z7982 Long term (current) use of aspirin: Secondary | ICD-10-CM | POA: Diagnosis not present

## 2023-07-29 DIAGNOSIS — Z79899 Other long term (current) drug therapy: Secondary | ICD-10-CM | POA: Diagnosis not present

## 2023-07-29 DIAGNOSIS — I10 Essential (primary) hypertension: Secondary | ICD-10-CM | POA: Diagnosis not present

## 2023-07-29 DIAGNOSIS — R2689 Other abnormalities of gait and mobility: Secondary | ICD-10-CM | POA: Diagnosis not present

## 2023-07-29 DIAGNOSIS — Z7902 Long term (current) use of antithrombotics/antiplatelets: Secondary | ICD-10-CM | POA: Diagnosis not present

## 2023-07-29 DIAGNOSIS — Z7984 Long term (current) use of oral hypoglycemic drugs: Secondary | ICD-10-CM | POA: Diagnosis not present

## 2023-07-29 DIAGNOSIS — R1032 Left lower quadrant pain: Principal | ICD-10-CM | POA: Insufficient documentation

## 2023-07-29 DIAGNOSIS — K449 Diaphragmatic hernia without obstruction or gangrene: Secondary | ICD-10-CM | POA: Diagnosis not present

## 2023-07-29 DIAGNOSIS — E559 Vitamin D deficiency, unspecified: Secondary | ICD-10-CM | POA: Diagnosis not present

## 2023-07-29 DIAGNOSIS — E871 Hypo-osmolality and hyponatremia: Secondary | ICD-10-CM | POA: Diagnosis not present

## 2023-07-29 DIAGNOSIS — M8580 Other specified disorders of bone density and structure, unspecified site: Secondary | ICD-10-CM | POA: Insufficient documentation

## 2023-07-29 DIAGNOSIS — R109 Unspecified abdominal pain: Secondary | ICD-10-CM | POA: Diagnosis present

## 2023-07-29 DIAGNOSIS — I2510 Atherosclerotic heart disease of native coronary artery without angina pectoris: Secondary | ICD-10-CM | POA: Insufficient documentation

## 2023-07-29 DIAGNOSIS — E039 Hypothyroidism, unspecified: Secondary | ICD-10-CM | POA: Diagnosis not present

## 2023-07-29 DIAGNOSIS — E785 Hyperlipidemia, unspecified: Secondary | ICD-10-CM | POA: Diagnosis not present

## 2023-07-29 DIAGNOSIS — Z8719 Personal history of other diseases of the digestive system: Secondary | ICD-10-CM | POA: Diagnosis not present

## 2023-07-29 DIAGNOSIS — K5730 Diverticulosis of large intestine without perforation or abscess without bleeding: Secondary | ICD-10-CM | POA: Diagnosis not present

## 2023-07-29 DIAGNOSIS — E119 Type 2 diabetes mellitus without complications: Secondary | ICD-10-CM | POA: Diagnosis not present

## 2023-07-29 DIAGNOSIS — R531 Weakness: Secondary | ICD-10-CM

## 2023-07-29 DIAGNOSIS — Z955 Presence of coronary angioplasty implant and graft: Secondary | ICD-10-CM | POA: Insufficient documentation

## 2023-07-29 DIAGNOSIS — Z85828 Personal history of other malignant neoplasm of skin: Secondary | ICD-10-CM | POA: Insufficient documentation

## 2023-07-29 DIAGNOSIS — Z87891 Personal history of nicotine dependence: Secondary | ICD-10-CM | POA: Diagnosis not present

## 2023-07-29 LAB — URINALYSIS, ROUTINE W REFLEX MICROSCOPIC
Bilirubin Urine: NEGATIVE
Glucose, UA: NEGATIVE mg/dL
Hgb urine dipstick: NEGATIVE
Ketones, ur: NEGATIVE mg/dL
Leukocytes,Ua: NEGATIVE
Nitrite: NEGATIVE
Protein, ur: 30 mg/dL — AB
Specific Gravity, Urine: 1.029 (ref 1.005–1.030)
pH: 6 (ref 5.0–8.0)

## 2023-07-29 LAB — OSMOLALITY, URINE: Osmolality, Ur: 345 mosm/kg (ref 300–900)

## 2023-07-29 LAB — COMPREHENSIVE METABOLIC PANEL
ALT: 28 U/L (ref 0–44)
AST: 20 U/L (ref 15–41)
Albumin: 4.1 g/dL (ref 3.5–5.0)
Alkaline Phosphatase: 64 U/L (ref 38–126)
Anion gap: 6 (ref 5–15)
BUN: 16 mg/dL (ref 8–23)
CO2: 24 mmol/L (ref 22–32)
Calcium: 9.1 mg/dL (ref 8.9–10.3)
Chloride: 93 mmol/L — ABNORMAL LOW (ref 98–111)
Creatinine, Ser: 0.62 mg/dL (ref 0.44–1.00)
GFR, Estimated: >60 mL/min (ref >60)
Glucose, Bld: 161 mg/dL — ABNORMAL HIGH (ref 70–99)
Potassium: 4.9 mmol/L (ref 3.5–5.1)
Sodium: 123 mmol/L — ABNORMAL LOW (ref 135–145)
Total Bilirubin: 0.4 mg/dL (ref 0.0–1.2)
Total Protein: 6.6 g/dL (ref 6.5–8.1)

## 2023-07-29 LAB — LIPASE, BLOOD: Lipase: 52 U/L — ABNORMAL HIGH (ref 11–51)

## 2023-07-29 LAB — SODIUM, URINE, RANDOM: Sodium, Ur: 63 mmol/L

## 2023-07-29 LAB — CBC
HCT: 35.6 % — ABNORMAL LOW (ref 36.0–46.0)
Hemoglobin: 12.9 g/dL (ref 12.0–15.0)
MCH: 31.9 pg (ref 26.0–34.0)
MCHC: 36.2 g/dL — ABNORMAL HIGH (ref 30.0–36.0)
MCV: 87.9 fL (ref 80.0–100.0)
Platelets: 322 10*3/uL (ref 150–400)
RBC: 4.05 MIL/uL (ref 3.87–5.11)
RDW: 12.3 % (ref 11.5–15.5)
WBC: 9.4 10*3/uL (ref 4.0–10.5)
nRBC: 0 % (ref 0.0–0.2)

## 2023-07-29 LAB — TSH: TSH: 2.53 u[IU]/mL (ref 0.350–4.500)

## 2023-07-29 LAB — OSMOLALITY: Osmolality: 273 mosm/kg — ABNORMAL LOW (ref 275–295)

## 2023-07-29 LAB — LACTIC ACID, PLASMA: Lactic Acid, Venous: 1.1 mmol/L (ref 0.5–1.9)

## 2023-07-29 MED ORDER — ONDANSETRON HCL 4 MG PO TABS: 4.0000 mg | ORAL_TABLET | Freq: Four times a day (QID) | ORAL | Status: DC | PRN

## 2023-07-29 MED ORDER — NITROGLYCERIN 0.4 MG SL SUBL: 0.4000 mg | SUBLINGUAL_TABLET | SUBLINGUAL | Status: DC | PRN

## 2023-07-29 MED ORDER — CLOPIDOGREL BISULFATE 75 MG PO TABS
75.0000 mg | ORAL_TABLET | Freq: Every day | ORAL | Status: DC
  Administered 2023-07-30: 75 mg via ORAL
  Filled 2023-07-29: qty 1

## 2023-07-29 MED ORDER — CARVEDILOL 25 MG PO TABS
25.0000 mg | ORAL_TABLET | Freq: Two times a day (BID) | ORAL | Status: DC
  Administered 2023-07-29 – 2023-07-30 (×2): 25 mg via ORAL
  Filled 2023-07-29: qty 1
  Filled 2023-07-29: qty 2

## 2023-07-29 MED ORDER — ASPIRIN 81 MG PO TBEC
81.0000 mg | DELAYED_RELEASE_TABLET | Freq: Every day | ORAL | Status: DC
  Administered 2023-07-30: 81 mg via ORAL
  Filled 2023-07-29: qty 1

## 2023-07-29 MED ORDER — SODIUM CHLORIDE 0.9% FLUSH: 3.0000 mL | INTRAVENOUS | Status: DC | PRN

## 2023-07-29 MED ORDER — ACETAMINOPHEN 325 MG PO TABS: 650.0000 mg | ORAL_TABLET | Freq: Four times a day (QID) | ORAL | Status: DC | PRN

## 2023-07-29 MED ORDER — LORATADINE 10 MG PO TABS
10.0000 mg | ORAL_TABLET | Freq: Every day | ORAL | Status: DC
  Administered 2023-07-29 – 2023-07-30 (×2): 10 mg via ORAL
  Filled 2023-07-29 (×2): qty 1

## 2023-07-29 MED ORDER — LEVOTHYROXINE SODIUM 75 MCG PO TABS
37.5000 ug | ORAL_TABLET | Freq: Every day | ORAL | Status: DC
  Administered 2023-07-30: 37.5 ug via ORAL
  Filled 2023-07-29: qty 1

## 2023-07-29 MED ORDER — ONDANSETRON HCL 4 MG/2ML IJ SOLN: 4.0000 mg | Freq: Four times a day (QID) | INTRAMUSCULAR | Status: DC | PRN

## 2023-07-29 MED ORDER — DOCUSATE SODIUM 100 MG PO CAPS
100.0000 mg | ORAL_CAPSULE | Freq: Every day | ORAL | Status: DC
  Administered 2023-07-30: 100 mg via ORAL
  Filled 2023-07-29: qty 1

## 2023-07-29 MED ORDER — EZETIMIBE 10 MG PO TABS
10.0000 mg | ORAL_TABLET | Freq: Every day | ORAL | Status: DC
  Filled 2023-07-29: qty 1

## 2023-07-29 MED ORDER — ENOXAPARIN SODIUM 40 MG/0.4ML IJ SOSY
40.0000 mg | PREFILLED_SYRINGE | INTRAMUSCULAR | Status: DC
  Administered 2023-07-29: 40 mg via SUBCUTANEOUS
  Filled 2023-07-29: qty 0.4

## 2023-07-29 MED ORDER — ACETAMINOPHEN 650 MG RE SUPP: 650.0000 mg | Freq: Four times a day (QID) | RECTAL | Status: DC | PRN

## 2023-07-29 MED ORDER — SODIUM CHLORIDE 0.9 % IV SOLN: INTRAVENOUS | Status: DC

## 2023-07-29 MED ORDER — SODIUM CHLORIDE 0.9 % IV SOLN: 250.0000 mL | INTRAVENOUS | Status: DC | PRN

## 2023-07-29 MED ORDER — SODIUM CHLORIDE 0.9% FLUSH
3.0000 mL | Freq: Two times a day (BID) | INTRAVENOUS | Status: DC
  Administered 2023-07-29 – 2023-07-30 (×2): 3 mL via INTRAVENOUS

## 2023-07-29 MED ORDER — IOHEXOL 350 MG/ML SOLN
75.0000 mL | Freq: Once | INTRAVENOUS | Status: AC | PRN
  Administered 2023-07-29: 75 mL via INTRAVENOUS

## 2023-07-29 MED ORDER — HYDROCODONE-ACETAMINOPHEN 5-325 MG PO TABS: 1.0000 | ORAL_TABLET | ORAL | Status: DC | PRN

## 2023-07-29 MED ORDER — SODIUM CHLORIDE 0.9 % IV BOLUS
1000.0000 mL | Freq: Once | INTRAVENOUS | Status: AC
  Administered 2023-07-29: 1000 mL via INTRAVENOUS

## 2023-07-29 MED ORDER — POLYETHYLENE GLYCOL 3350 17 G PO PACK: 17.0000 g | PACK | Freq: Every day | ORAL | Status: DC | PRN

## 2023-07-29 MED ORDER — HYDRALAZINE HCL 20 MG/ML IJ SOLN: 5.0000 mg | Freq: Four times a day (QID) | INTRAMUSCULAR | Status: DC | PRN

## 2023-07-29 NOTE — H&P (Signed)
 [facility] History and Physical  Brittany Figueroa UJW:[id number] DOB: [birth year] DOA: 07/29/2023  Referring physician: ED  PCP: Brittany Aspen, MD   Patient is coming from: Home  Chief Complaint: Abdominal pain, low sodium  HPI:  Patient is a 83 year old female with past medical history of angina, CAD status post stent, GERD, hyperlipidemia, hypothyroidism, prediabetes hypertension to hospital with abdominal pain o 4 to 5 days intermittent in nature which has recently gotten worse and has localized over the left lower quadrant since this morning which was intense in nature and very sharp.  This has mellowed down some now..  She was taking over-the-counter pain medication and using heat but no real help.  Has had 2-3 normal bowel movements recently.  Patient denies hematemesis melena nausea or vomiting. History of left inguinal hernia repair.  History of colonoscopy 10 years back.  Patient denies any urinary urgency, frequency or dysuria.  Denies any fever chills or rigor.  Denies any chest pain, shortness of breath or dyspnea.  Denies any dizziness lightheadedness or syncope.  Patient is normally functional at home.  Complains of mild fatigue but no headache.  Of note, patient has been recently discontinued from spironolactone for hyponatremia 5 days ago but sodium level was still noted to be low in the ED with sodium level of 123.  Vitals in the ED was within normal limits.  Lipase was 52.  Urinalysis negative for infection.  CBC with WBC at 9.4.  CT scan of the abdomen with some thickening of the sigmoid colon area.  Patient was then considered for admission to hospital for hyponatremia with abdominal pain.   Assessment and Plan Principal Problem:   Hyponatremia Active Problems:   Dyslipidemia   Essential hypertension   Abdominal pain   Left lower quadrant abdominal pain.  CT scan shows some thickening of the bowel loops along the sigmoid colon which could be secondary to  level of distention, no evidence of bowel obstruction.  No fever or leukocytosis.  Urinalysis with no evidence of UTI.  Lipase at 52.  Likely nonspecific could be gas related pain we will continue to monitor overnight.  Will add simethicone, dicyclomine  Acute on chronic hyponatremia.  Sodium level of 05/20/2021 on presentation.  Sodium was 127, one week back and does have mild hyponatremia at baseline.  Will get serum osmolality urine osmolality and urinary sodium levels.  History of normal saline in the ED.  Check TSH and cortisol. Hold off with diuretics including HCTZ.  Continue to monitor closely overnight.  Check levels in AM.  History of coronary artery status post stent.  No acute issues at this time.  On aspirin and Plavix as outpatient.  Will continue for now.  Acquired hypothyroidism.  Continue Synthroid.  Check TSH.  History of hypertension.  On Coreg, irbesartan Cardizem at home.  Hold triamterene and hydrochlorothiazide and spironolactone.  Add as needed hydralazine  History of GERD  Hyperlipidemia On Zetia.  Will continue.  Patient also on Repatha as outpatient.  Prediabetes On metformin as outpatient.  Will hold for now.  Latest hemoglobin A1c of 6.3 a week back.  History of osteopenia/Vitamin D deficiency  Vascular calcifications along the aorta and branch vessels with the potential for significant stenosis along the common iliac arteries.  Will continue aspirin and Plavix.  Follows up with vascular surgery as outpatient.  Does not look like ischemic issue at this time.  Will however add lactate.  Send will be on IV fluids overnight.  DVT Prophylaxis: Lovenox subcu  Review of Systems:  All systems were reviewed and were negative unless otherwise mentioned in the HPI   Past Medical History:  Diagnosis Date   Abnormal finding on EKG, new anterolateral T-wave inversions  05/14/2012   Abnormal nuclear stress test, with infero-lateral ischemia 05/14/2012   Arthritis     "right thumb; all my fingers" (05/13/2012), cervical spondylosis    Atypical angina (HCC) 05/14/2012   CAD (coronary artery disease), 05/13/12, with 80% LAD 05/14/2012   Cancer (HCC)    basal cell on facial in L temporal region     [facility] arthritis, thumb, degenerative    Complication of anesthesia 12/2009   "had endoscopy; larynx went into spasms; stopped breathing for 15 seconds" (05/13/2012)   GERD (gastroesophageal reflux disease) 2011   Hypercholesteremia    Hypertension    S/P angioplasty with stent, LAD 05/13/12 05/14/2012   Tuberculosis    test +- [place] [facility] - Dr. Ronne Figueroa- CXR- OK   Past Surgical History:  Procedure Laterality Date   ANTERIOR CERVICAL DECOMP/DISCECTOMY FUSION N/A 12/13/2013   Procedure: ANTERIOR CERVICAL DECOMPRESSION/DISCECTOMY FUSION 3 LEVELS Cervical four/five,five/six,six/seven. Anterior cervical disectomy and fusion with peek and plate ;  Surgeon: Brittany Pacini, MD;  Location: [facility] NEURO ORS;  Service: Neurosurgery;  Laterality: N/A;   CARDIAC CATHETERIZATION N/A 07/02/2015   Procedure: Left Heart Cath and Coronary Angiography;  Surgeon: Brittany Gess, MD;  Location: [facility] INVASIVE CV LAB;  Service: Cardiovascular;  Laterality: N/A;   CORONARY ANGIOPLASTY WITH STENT PLACEMENT  05/13/2012   DES to LAD; she has total RCA with left to rt coll. normal LV function done for positive nuc study   DILATION AND CURETTAGE OF UTERUS  1960's; 1970's; 1980   "probably 3" (05/13/2012)   FOREARM / WRIST TENDON LESION EXCISION  ?11/2001   "right; did waver to try to get ulnar out of hole that it had cut" (05/13/2012   INGUINAL HERNIA REPAIR  ~ 1966; 07/21/2002   "right; left" (05/13/2012)   LEFT HEART CATHETERIZATION WITH CORONARY ANGIOGRAM N/A 05/13/2012   Procedure: LEFT HEART CATHETERIZATION WITH CORONARY ANGIOGRAM;  Surgeon: Brittany Gess, MD;  Location: [facility] CATH LAB;  Service: Cardiovascular;  Laterality: N/A;   OSTEOTOMY AND ULNAR SHORTENING  07/21/2002   "right" (05/13/2012)    TONSILLECTOMY AND ADENOIDECTOMY  1950's   TUBAL LIGATION  1980    Social History:  reports that she quit smoking about 35 years ago. Her smoking use included cigarettes. She started smoking about 55 years ago. She has a 10 pack-year smoking history. She has never used smokeless tobacco. She reports current alcohol use of about 7.0 standard drinks of alcohol per week. She reports that she does not use drugs.  Allergies  Allergen Reactions   Statins Other (See Comments)    "intermittent loss of circulation; hands and arms will go to sleep; feet will get cramps; fatigue" (05/13/2012).  This occurred with Lipitor, Zocor, Crestor 5 mg qd, and she thinks pravastatin as well   Amlodipine Itching   Gadolinium Derivatives Itching and Nausea Only    Pt had severe nausea and itching on legs, neck and back, per dr Alfredo Batty pt needs 13 hour prep before contrast in the future    Family History  Problem Relation Age of Onset   Hypertension Mother    Diabetes Mother    Hyperlipidemia Father    Heart disease Father    Stroke Father    Heart disease Sister  Diabetes Sister    Heart disease Brother    Diabetes Maternal Aunt      Prior to Admission medications   Medication Sig Start Date End Date Taking? Authorizing Provider  acetaminophen (TYLENOL) 500 MG tablet Take 500 mg by mouth every 6 (six) hours as needed for mild pain or headache.    [provider]  aspirin EC (ASPIRIN 81) 81 MG tablet Take 1 tablet (81 mg total) by mouth daily. 10/23/22   Brittany Gess, MD  calcium-vitamin D (OSCAL WITH D) 500-200 MG-UNIT per tablet Take 1 tablet by mouth daily as needed.    [provider]  carvedilol (COREG) 12.5 MG tablet Take 12.5 mg by mouth 2 (two) times daily with a meal.    [provider]  Cholecalciferol (VITAMIN D3) 2000 UNITS TABS Take 2,000 Units by mouth 2 (two) times daily.    [provider]  clopidogrel (PLAVIX) 75 MG tablet TAKE 1 TABLET(75 MG) BY  MOUTH DAILY 06/22/23   Brittany Gess, MD  diclofenac sodium (VOLTAREN) 1 % GEL Apply 2 g topically daily as needed. For pain    [provider]  DILT-XR 240 MG 24 hr capsule Take 240 mg by mouth daily. 02/22/20   [provider]  diltiazem (DILACOR XR) 240 MG 24 hr capsule     [provider]  ezetimibe (ZETIA) 10 MG tablet TAKE 1 TABLET(10 MG) BY MOUTH DAILY 05/27/23   Brittany Gess, MD  glucose blood (ONETOUCH VERIO) test strip Use to test blood sugar once daily Dx code E11.9 07/30/16   Reather Littler, MD  icosapent Ethyl (VASCEPA) 1 g capsule Take 2 capsules (2 g total) by mouth 2 (two) times daily. 03/13/23   Brittany Gess, MD  irbesartan (AVAPRO) 300 MG tablet TAKE 1/2 TABLET BY MOUTH TWICE DAILY IN THE MORNING AND AT BEDTIME 02/02/23   Brittany Gess, MD  levothyroxine (SYNTHROID) 25 MCG tablet TAKE 1 AND 1/2 TABLETS BY MOUTH DAILY BEFORE BREAKFAST 07/20/23   [place], [place], MD  metFORMIN (GLUCOPHAGE-XR) 500 MG 24 hr tablet TAKE 2 TABLETS BY MOUTH DAILY WITH SUPPER Patient not taking: Reported on 07/28/2023 07/28/22   Reather Littler, MD  Multiple Vitamin (MULTIVITAMIN WITH MINERALS) TABS Take 1 tablet by mouth daily.    [provider]  Multiple Vitamins-Minerals (PRESERVISION AREDS 2+MULTI VIT PO) Take 1 tablet by mouth in the morning and at bedtime.    [provider]  nitroGLYCERIN (NITROSTAT) 0.4 MG SL tablet PLACE 1 TABLET UNDER THE TONGUE EVERY 5 MINUTES AS NEEDED FOR CHEST PAIN 10/28/21   Brittany Gess, MD  REPATHA SURECLICK 140 MG/ML SOAJ INJECT CONTENTS OF ONE PEN UNDER THE SKIN EVERY 14 DAYS 08/12/22   Brittany Gess, MD  triamterene-hydrochlorothiazide (MAXZIDE-25) 37.5-25 MG tablet Take 1 tablet by mouth daily. Patient taking  1/4 tablet    [provider]    Physical Exam:  Vitals:   07/29/23 1019 07/29/23 1027 07/29/23 1323  BP: (!) 143/61  134/63  Pulse: 66  67  Resp: 17  18  Temp: 98.7 F (37.1 C)  98.7 F (37.1  C)  TempSrc:   Oral  SpO2: 99%  99%  Weight:  61.2 kg   Height:  5\' 1"  (1.549 m)    Wt Readings from Last 3 Encounters:  07/29/23 61.2 kg  07/28/23 61.2 kg  03/30/23 61.2 kg   Body mass index is 25.49 kg/m.  General:  Average built, not in  obvious distress elderly female Communicative HENT: Normocephalic, No scleral pallor or icterus noted. Oral mucosa is slightly dry Chest:  Clear breath sounds.  . No crackles or wheezes.  CVS: S1 &S2 heard. No murmur.  Regular rate and rhythm. Abdomen: Soft, nontender, nondistended.  Bowel sounds are heard. No abdominal mass palpated, mild nonspecific tenderness over the left lower quadrant. Extremities: No cyanosis, clubbing or edema.  Peripheral pulses are palpable. Psych: Alert, awake and oriented, normal mood CNS:  No cranial nerve deficits.  Power equal in all extremities.   Skin: Warm and dry.  No rashes noted.  Decreased skin turgor.  Labs on Admission:   CBC: Recent Labs  Lab 07/29/23 1021  WBC 9.4  HGB 12.9  HCT 35.6*  MCV 87.9  PLT 322    Basic Metabolic Panel: Recent Labs  Lab 07/29/23 1021  NA 123*  K 4.9  CL 93*  CO2 24  GLUCOSE 161*  BUN 16  CREATININE 0.62  CALCIUM 9.1    Liver Function Tests: Recent Labs  Lab 07/29/23 1021  AST 20  ALT 28  ALKPHOS 64  BILITOT 0.4  PROT 6.6  ALBUMIN 4.1   Recent Labs  Lab 07/29/23 1021  LIPASE 52*   No results for input(s): "AMMONIA" in the last 168 hours.  Cardiac Enzymes: No results for input(s): "CKTOTAL", "CKMB", "CKMBINDEX", "TROPONINI" in the last 168 hours.  BNP (last 3 results) No results for input(s): "BNP" in the last 8760 hours.  ProBNP (last 3 results) No results for input(s): "PROBNP" in the last 8760 hours.  CBG: No results for input(s): "GLUCAP" in the last 168 hours.  Lipase     Component Value Date/Time   LIPASE 52 (H) 07/29/2023 1021     Urinalysis    Component Value Date/Time   COLORURINE YELLOW 07/29/2023 1020    APPEARANCEUR CLEAR 07/29/2023 1020   LABSPEC 1.029 07/29/2023 1020   PHURINE 6.0 07/29/2023 1020   GLUCOSEU NEGATIVE 07/29/2023 1020   GLUCOSEU NEGATIVE 07/17/2020 0821   HGBUR NEGATIVE 07/29/2023 1020   BILIRUBINUR NEGATIVE 07/29/2023 1020   KETONESUR NEGATIVE 07/29/2023 1020   PROTEINUR 30 (A) 07/29/2023 1020   UROBILINOGEN 0.2 07/17/2020 0821   NITRITE NEGATIVE 07/29/2023 1020   LEUKOCYTESUR NEGATIVE 07/29/2023 1020     Drugs of Abuse  No results found for: "LABOPIA", "COCAINSCRNUR", "LABBENZ", "AMPHETMU", "THCU", "LABBARB"    Radiological Exams on Admission: CT ABDOMEN PELVIS W CONTRAST Result Date: 07/29/2023 CLINICAL DATA:  Left lower quadrant abdominal pain. EXAM: CT ABDOMEN AND PELVIS WITH CONTRAST TECHNIQUE: Multidetector CT imaging of the abdomen and pelvis was performed using the standard protocol following bolus administration of intravenous contrast. RADIATION DOSE REDUCTION: This exam was performed according to the departmental dose-optimization program which includes automated exposure control, adjustment of the mA and/or kV according to patient size and/or use of iterative reconstruction technique. CONTRAST:  75mL OMNIPAQUE IOHEXOL 350 MG/ML SOLN COMPARISON:  Report of the study of 2004. FINDINGS: Lower chest: Moderate hiatal hernia. Coronary calcifications are seen. There are some linear changes along the lung bases likely scar or atelectasis. Hepatobiliary: Fatty liver infiltration. Patent portal vein. Gallbladder is nondilated. Pancreas: Unremarkable. No pancreatic ductal dilatation or surrounding inflammatory changes. Spleen: Normal in size without focal abnormality. Adrenals/Urinary Tract: Adrenal glands are unremarkable. Kidneys are normal, without renal calculi, focal lesion, or hydronephrosis. Bladder is unremarkable. Stomach/Bowel: Large bowel has a normal course and caliber with scattered stool. There is slight wall thickening along the sigmoid colon but the sigmoid  colon  is underdistended. Please correlate for any symptoms. Few scattered colonic diverticula. Normal appendix. The stomach and small bowel are nondilated. Vascular/Lymphatic: Normal caliber aorta and IVC. Extensive vascular calcifications along the aorta and branch vessels with potential significant stenosis involving the common iliac arteries. Please correlate for particular symptoms of claudication or other process. No specific abnormal lymph node enlargement identified in the abdomen and pelvis. There is a calcified lymph node in left a pelvis. Nonspecific Reproductive: Uterus and bilateral adnexa are unremarkable. Other: No free air or free fluid. Musculoskeletal: Curvature of the spine. Moderate degenerative changes of the spine. Osteopenia. Scattered degenerative changes of the pelvis as well. IMPRESSION: No bowel obstruction, free air or free fluid. Slight wall thickening along the sigmoid colon which could relate to level of distention. Please correlate for any particular symptoms of a colitis. Normal appendix. Moderate hiatal hernia. Prominent vascular calcifications along the aorta and branch vessels with areas of potential significant stenosis along the common iliac arteries. Please correlate with any symptoms. Electronically Signed   By: Karen Kays M.D.   On: 07/29/2023 14:53    EKG: None available   Consultant: None  Code Status: DNR.  Discussed with the patient at bedside.  Microbiology none  Antibiotics: None  Family Communication:  Patients' condition and plan of care including tests being ordered have been discussed with the patient who indicate understanding and agree with the plan.   Status is: Observation The patient remains OBS appropriate and will d/c before 2 midnights.   Severity of Illness: The appropriate patient status for this patient is OBSERVATION. Observation status is judged to be reasonable and necessary in order to provide the required intensity of service to  ensure the patient's safety. The patient's presenting symptoms, physical exam findings, and initial radiographic and laboratory data in the context of their medical condition is felt to place them at decreased risk for further clinical deterioration. Furthermore, it is anticipated that the patient will be medically stable for discharge from the hospital within 2 midnights of admission.   Signed, Joycelyn Das, MD [facility] 07/29/2023

## 2023-07-29 NOTE — ED Provider Notes (Signed)
 Patient presents from [facility] emergency department for a CT scan.  She has been having some pain in her left lower abdomen.  CT scan does not show any acute abdominal findings.  There is some atherosclerosis with plaques in her aorta and bilateral iliacs.  She does not report any symptoms that sound like claudication.  She has palpable pulses in the left foot.  I am not able to palpate pulses in the right foot although patient does not have any discoloration, pain or other symptoms that sound more concerning for acute ischemic events.  Her sodium is markedly low at 123.  She started spironolactone about a month ago.  She had her sodium checked 8 days ago and it was 127.  She stopped her spironolactone at that point.  She was prescribed hydrochlorothiazide but she has not actually picked it up from the pharmacy yet and is currently not taking any other diuretics.  She has been feeling a little bit fatigued.  Given the significantly low sodium, will plan admission.  She has already been given normal saline from the prior providers.  Discussed with Dr. Tyson Babinski who will admit the pt.   Rolan Bucco, MD 07/29/23 [phone]

## 2023-07-29 NOTE — ED Notes (Signed)
 Patient provided paper work and instructions for transferring to [facility] ED via pov for CT scan. IV secured. Report given to Charge nurse at [facility]. Patient ambulatory and understood instructions.

## 2023-07-29 NOTE — ED Provider Notes (Signed)
 [place] [facility] AT [facility] Provider Note   CSN: [id number] Arrival date & time: 07/29/23  1013     History  Chief Complaint  Patient presents with   Abdominal Pain    Brittany Figueroa is a 83 y.o. female.  With history of hypertension, hypercholesterolemia, GERD, CAD presenting to the ED for evaluation of left lower quadrant abdominal pain.  Pain has been intermittent for the past 7 days.  She woke up this morning at 2:30 AM and it has been constant and more painful since then.  She has had 3 bowel movements today which is normal for her.  No diarrhea.  No melena or hematochezia.  No nausea or vomiting.  Pain is described as a constant ache.  She does have a history of left inguinal hernia repair.  She denies any upper abdominal pain.  She denies any urinary complaints.  No vaginal complaints.  No fevers or chills.  No chest pain or shortness of breath.  States her last colonoscopy was approximately 10 years ago and normal.  She took Excedrin 50 mg Tylenol at 8:30 AM which has helped some with the pain.  She states her sodium has been low so she was recently discontinued from her spironolactone.  Last dose was 5 days ago.  She is still taking hydrochlorothiazide.   Abdominal Pain      Home Medications Prior to Admission medications   Medication Sig Start Date End Date Taking? Authorizing Provider  acetaminophen (TYLENOL) 500 MG tablet Take 500 mg by mouth every 6 (six) hours as needed for mild pain or headache.    [provider]  aspirin EC (ASPIRIN 81) 81 MG tablet Take 1 tablet (81 mg total) by mouth daily. 10/23/22   Runell Gess, MD  calcium-vitamin D (OSCAL WITH D) 500-200 MG-UNIT per tablet Take 1 tablet by mouth daily as needed.    [provider]  carvedilol (COREG) 12.5 MG tablet Take 12.5 mg by mouth 2 (two) times daily with a meal.    [provider]  Cholecalciferol (VITAMIN D3) 2000 UNITS TABS Take 2,000 Units by  mouth 2 (two) times daily.    [provider]  clopidogrel (PLAVIX) 75 MG tablet TAKE 1 TABLET(75 MG) BY MOUTH DAILY 06/22/23   Runell Gess, MD  diclofenac sodium (VOLTAREN) 1 % GEL Apply 2 g topically daily as needed. For pain    [provider]  DILT-XR 240 MG 24 hr capsule Take 240 mg by mouth daily. 02/22/20   [provider]  diltiazem (DILACOR XR) 240 MG 24 hr capsule     [provider]  ezetimibe (ZETIA) 10 MG tablet TAKE 1 TABLET(10 MG) BY MOUTH DAILY 05/27/23   Runell Gess, MD  glucose blood (ONETOUCH VERIO) test strip Use to test blood sugar once daily Dx code E11.9 07/30/16   Reather Littler, MD  icosapent Ethyl (VASCEPA) 1 g capsule Take 2 capsules (2 g total) by mouth 2 (two) times daily. 03/13/23   Runell Gess, MD  irbesartan (AVAPRO) 300 MG tablet TAKE 1/2 TABLET BY MOUTH TWICE DAILY IN THE MORNING AND AT BEDTIME 02/02/23   Runell Gess, MD  levothyroxine (SYNTHROID) 25 MCG tablet TAKE 1 AND 1/2 TABLETS BY MOUTH DAILY BEFORE BREAKFAST 07/20/23   Thapa, [place], MD  metFORMIN (GLUCOPHAGE-XR) 500 MG 24 hr tablet TAKE 2 TABLETS BY MOUTH DAILY WITH SUPPER Patient not taking: Reported on 07/28/2023 07/28/22   Reather Littler, MD  Multiple Vitamin (MULTIVITAMIN WITH MINERALS) TABS Take 1 tablet by mouth daily.    [provider]  Multiple Vitamins-Minerals (PRESERVISION AREDS 2+MULTI VIT PO) Take 1 tablet by mouth in the morning and at bedtime.    [provider]  nitroGLYCERIN (NITROSTAT) 0.4 MG SL tablet PLACE 1 TABLET UNDER THE TONGUE EVERY 5 MINUTES AS NEEDED FOR CHEST PAIN 10/28/21   Runell Gess, MD  REPATHA SURECLICK 140 MG/ML SOAJ INJECT CONTENTS OF ONE PEN UNDER THE SKIN EVERY 14 DAYS 08/12/22   Runell Gess, MD  triamterene-hydrochlorothiazide (MAXZIDE-25) 37.5-25 MG tablet Take 1 tablet by mouth daily. Patient taking  1/4 tablet    [provider]      Allergies    Statins, Amlodipine, and Gadolinium  derivatives    Review of Systems   Review of Systems  Gastrointestinal:  Positive for abdominal pain.  All other systems reviewed and are negative.   Physical Exam Updated Vital Signs BP 134/63 (BP Location: Left Arm)   Pulse 67   Temp 98.7 F (37.1 C) (Oral)   Resp 18   Ht 5\' 1"  (1.549 m)   Wt 61.2 kg   SpO2 99%   BMI 25.49 kg/m  Physical Exam Vitals and nursing note reviewed.  Constitutional:      General: She is not in acute distress.    Appearance: Normal appearance. She is normal weight. She is not ill-appearing.     Comments: Resting comfortably in bed  HENT:     Head: Normocephalic and atraumatic.  Cardiovascular:     Rate and Rhythm: Normal rate and regular rhythm.  Pulmonary:     Effort: Pulmonary effort is normal. No respiratory distress.  Abdominal:     General: Abdomen is flat.     Tenderness: There is abdominal tenderness in the left lower quadrant.  Musculoskeletal:        General: Normal range of motion.     Cervical back: Neck supple.  Skin:    General: Skin is warm and dry.  Neurological:     Mental Status: She is alert and oriented to person, place, and time.  Psychiatric:        Mood and Affect: Mood normal.        Behavior: Behavior normal.     ED Results / Procedures / Treatments   Labs (all labs ordered are listed, but only abnormal results are displayed) Labs Reviewed  LIPASE, BLOOD - Abnormal; Notable for the following components:      Result Value   Lipase 52 (*)    All other components within normal limits  COMPREHENSIVE METABOLIC PANEL - Abnormal; Notable for the following components:   Sodium 123 (*)    Chloride 93 (*)    Glucose, Bld 161 (*)    All other components within normal limits  CBC - Abnormal; Notable for the following components:   HCT 35.6 (*)    MCHC 36.2 (*)    All other components within normal limits  URINALYSIS, ROUTINE W REFLEX MICROSCOPIC - Abnormal; Notable for the following components:   Protein, ur 30  (*)    Bacteria, UA RARE (*)    All other components within normal limits    EKG None  Radiology No results found.  Procedures Procedures    Medications Ordered in ED Medications  sodium chloride 0.9 % bolus 1,000 mL (0 mLs Intravenous Stopped 07/29/23 1241)    ED Course/ Medical Decision Making/ A&P  Medical Decision Making Amount and/or Complexity of Data Reviewed Labs: ordered. Radiology: ordered.  This patient presents to the ED for concern of left lower quadrant abdominal pain, this involves an extensive number of treatment options, and is a complaint that carries with it a high risk of complications and morbidity.  Differential diagnosis of her lower abdominal considerations include pelvic inflammatory disease, ectopic pregnancy, appendicitis, urinary calculi, primary dysmenorrhea, septic abortion, ruptured ovarian cyst or tumor, ovarian torsion, tubo-ovarian abscess, degeneration of fibroid, endometriosis, diverticulitis, cystitis.   My initial workup includes labs, imaging  Additional history obtained from: Nursing notes from this visit.  I ordered, reviewed and interpreted labs which include: CBC, CMP, lipase, urinalysis.  No leukocytosis or anemia.  Hyponatremia of 123, hypochloremia of 93, hyperglycemia of 161.  Normal kidney function and LFTs.  Lipase minimally elevated to 52.  Urine without evidence of infection.  I ordered imaging studies including CT abdomen pelvis  Afebrile, hemodynamically stable.  83 year old female presenting to the ED for evaluation of left lower quadrant abdominal pain.  Symptoms began 1 week ago, worsened this morning.  Was encouraged to come to the emergency department.  Lab workup as stated above.  Hyponatremia likely due to diuretic use.  She recently discontinued her spironolactone but continues to take her hydrochlorothiazide.  Replenishment was initiated with IV normal saline bolus in the emergency  department.  Unfortunately, the drivers facility does not have a CT scan machine that is functional at this time.  I do believe the patient would benefit from CT imaging of her abdomen and pelvis.  Patient be transported via private vehicle to [facility] emergency department for imaging.  Disposition is pending results of CT imaging.  Patient may benefit show decision-making conversation regarding admission due to hyponatremia versus close outpatient follow-up.  Discussed case with Dr. Particia Nearing who agrees to accept patient in transfer.  Disposition and decision making pending results.  Please see accepting provider note.  Patient's case discussed with Dr. Eloise Harman who agrees with plan to transfer for CT.   Note: Portions of this report may have been transcribed using voice recognition software. Every effort was made to ensure accuracy; however, inadvertent computerized transcription errors may still be present.        Final Clinical Impression(s) / ED Diagnoses Final diagnoses:  LLQ abdominal pain  Hyponatremia    Rx / DC Orders ED Discharge Orders     None         Mora Bellman 07/29/23 1334    Rondel Baton, MD 07/31/23 [id number]

## 2023-07-29 NOTE — ED Triage Notes (Signed)
 Pt via pov from home with LLQ abdominal pain that was intermittent for 4-5 days and has become constant. Pt has taken OTC pain meds today and has been using heat for comfort with no real help. Pt alert & oriented, nad noted.

## 2023-07-29 NOTE — ED Notes (Signed)
 Pt reports that she was sent by her OB-GYN for CT scan. Pt informed that CT scanner is down but that we will make sure she is examined and that we will make sure she is thoroughly evaluated.

## 2023-07-30 DIAGNOSIS — E871 Hypo-osmolality and hyponatremia: Secondary | ICD-10-CM | POA: Diagnosis not present

## 2023-07-30 DIAGNOSIS — H353122 Nonexudative age-related macular degeneration, left eye, intermediate dry stage: Secondary | ICD-10-CM | POA: Diagnosis not present

## 2023-07-30 DIAGNOSIS — R1032 Left lower quadrant pain: Secondary | ICD-10-CM | POA: Diagnosis not present

## 2023-07-30 LAB — BASIC METABOLIC PANEL
Anion gap: 6 (ref 5–15)
BUN: 12 mg/dL (ref 8–23)
CO2: 22 mmol/L (ref 22–32)
Calcium: 8.6 mg/dL — ABNORMAL LOW (ref 8.9–10.3)
Chloride: 101 mmol/L (ref 98–111)
Creatinine, Ser: 0.53 mg/dL (ref 0.44–1.00)
GFR, Estimated: >60 mL/min (ref >60)
Glucose, Bld: 122 mg/dL — ABNORMAL HIGH (ref 70–99)
Potassium: 4.1 mmol/L (ref 3.5–5.1)
Sodium: 129 mmol/L — ABNORMAL LOW (ref 135–145)

## 2023-07-30 LAB — CBC
HCT: 34.1 % — ABNORMAL LOW (ref 36.0–46.0)
Hemoglobin: 12.2 g/dL (ref 12.0–15.0)
MCH: 31.7 pg (ref 26.0–34.0)
MCHC: 35.8 g/dL (ref 30.0–36.0)
MCV: 88.6 fL (ref 80.0–100.0)
Platelets: 301 10*3/uL (ref 150–400)
RBC: 3.85 MIL/uL — ABNORMAL LOW (ref 3.87–5.11)
RDW: 12.7 % (ref 11.5–15.5)
WBC: 8.5 10*3/uL (ref 4.0–10.5)
nRBC: 0 % (ref 0.0–0.2)

## 2023-07-30 MED ORDER — IRBESARTAN 75 MG PO TABS
150.0000 mg | ORAL_TABLET | Freq: Two times a day (BID) | ORAL | Status: DC
  Administered 2023-07-30: 150 mg via ORAL
  Filled 2023-07-30: qty 2

## 2023-07-30 MED ORDER — HYDRALAZINE HCL 25 MG PO TABS: 25.0000 mg | ORAL_TABLET | Freq: Four times a day (QID) | ORAL | Status: DC | PRN

## 2023-07-30 MED ORDER — DILTIAZEM HCL ER COATED BEADS 240 MG PO CP24
240.0000 mg | ORAL_CAPSULE | Freq: Every day | ORAL | Status: DC
  Administered 2023-07-30: 240 mg via ORAL
  Filled 2023-07-30: qty 2
  Filled 2023-07-30: qty 1

## 2023-07-30 NOTE — Discharge Summary (Signed)
 DISCHARGE SUMMARY  Brittany Figueroa  MR#: [id number]  DOB:[birth year]  Date of Admission: 07/29/2023 Date of Discharge: 07/30/2023  Attending Physician:Brittany Brandel Silvestre Gunner, MD  Patient's MWN:[id number], Brittany Don, MD  Disposition: D/C home   Follow-up Appts:  Follow-up Information     Brittany Aspen, MD Follow up in 1 week(s).   Specialty: Internal Medicine Why: You will need to have your sodium level checked in ~ 1 week. Contact information: [address] [place] [address] [phone]                 Tests Needing Follow-up: -recheck of Na is suggested in 5-7 days  -diuretics should be avoided in this patient   Discharge Diagnoses: Left lower quadrant abdominal pain - idiopathic  Acute on subacute hyponatremia CAD status post stenting's Hypothyroidism HTN HLD DM2 Osteopenia/vitamin D deficiency   Initial presentation: 83 year old with a history of CAD status post stenting, GERD, HLD, hypothyroidism, DM2, and HTN who presented to the ER 3/12 with 4 to 5 days of intermittent abdominal pain focused in the left lower quadrant.  She denied hematemesis melena nausea or vomiting.  She has a known history of a left inguinal hernia repair.  She denies urinary frequency or dysuria.  UA was unremarkable.  Lipase was 52.  WBC 9.4.  CT of the abdomen noted mild thickening of the sigmoid colon.  She was found to be hyponatremic with a sodium of 123.   Hospital Course:  Left lower quadrant abdominal pain CT scan reassuring with no marked abnormalities but suggesting a possible mild sigmoid colitis - no fever or leukocytosis to suggest that abx are indicated - UA not consistent with UTI - lipase normal -pain resolved at time of discharge with patient tolerating diet -could represent muscular spasm related to hyponatremia, symptoms related to mild colitis, or even migratory gas type pains -nonetheless there is no indication that ongoing hospitalization  would benefit her and she is therefore cleared for discharge home   Acute on subacute hyponatremia Sodium level 127 07/21/23 - has recently been taken off of Aldactone, but was apparently placed on HCTZ instead - avoid all diuretics - Na improving nicely - avoid further diuretic use and f/u Na in 1 week   CAD status post stenting's Continue aspirin and Plavix - asymptomatic   Hypothyroidism Continue usual dose of Synthroid -TSH normal   HTN Resume usual home medications with exception of diuretic   HLD Continue usual home medications to include Zetia and Repatha   DM2 CBG well-controlled at present   Osteopenia/vitamin D deficiency No changes in usual home therapy  Allergies as of 07/30/2023       Reactions   Statins Other (See Comments)   "intermittent loss of circulation; hands and arms will go to sleep; feet will get cramps; fatigue" (05/13/2012).  This occurred with Lipitor, Zocor, Crestor 5 mg qd, and she thinks pravastatin as well   Amlodipine Itching   Gadolinium Derivatives Itching, Nausea Only   Pt had severe nausea and itching on legs, neck and back, per dr Brittany Figueroa pt needs 13 hour prep before contrast in the future        Medication List     TAKE these medications    acetaminophen 500 MG tablet Commonly known as: TYLENOL Take 500 mg by mouth every 6 (six) hours as needed for mild pain or headache.   aspirin EC 81 MG tablet Commonly known as: Aspirin 81 Take 1 tablet (81  mg total) by mouth daily.   calcium-vitamin D 500-200 MG-UNIT tablet Commonly known as: OSCAL WITH D Take 1 tablet by mouth daily.   carvedilol 25 MG tablet Commonly known as: COREG Take 25 mg by mouth 2 (two) times daily with a meal.   clopidogrel 75 MG tablet Commonly known as: PLAVIX TAKE 1 TABLET(75 MG) BY MOUTH DAILY   diclofenac sodium 1 % Gel Commonly known as: VOLTAREN Apply 2 g topically daily as needed. For pain   Dilt-XR 240 MG 24 hr capsule Generic drug:  diltiazem Take 240 mg by mouth daily.   ezetimibe 10 MG tablet Commonly known as: ZETIA TAKE 1 TABLET(10 MG) BY MOUTH DAILY What changed: See the new instructions.   famotidine 20 MG tablet Commonly known as: PEPCID Take 20 mg by mouth at bedtime.   fexofenadine 180 MG tablet Commonly known as: ALLEGRA Take 180 mg by mouth daily as needed for allergies or rhinitis.   glucose blood test strip Commonly known as: OneTouch Verio Use to test blood sugar once daily Dx code E11.9   icosapent Ethyl 1 g capsule Commonly known as: Vascepa Take 2 capsules (2 g total) by mouth 2 (two) times daily.   irbesartan 300 MG tablet Commonly known as: AVAPRO TAKE 1/2 TABLET BY MOUTH TWICE DAILY IN THE MORNING AND AT BEDTIME   levothyroxine 25 MCG tablet Commonly known as: SYNTHROID TAKE 1 AND 1/2 TABLETS BY MOUTH DAILY BEFORE BREAKFAST   multivitamin with minerals Tabs tablet Take 1 tablet by mouth daily.   nitroGLYCERIN 0.4 MG SL tablet Commonly known as: NITROSTAT PLACE 1 TABLET UNDER THE TONGUE EVERY 5 MINUTES AS NEEDED FOR CHEST PAIN   PRESERVISION AREDS 2+MULTI VIT PO Take 1 tablet by mouth in the morning and at bedtime.   Repatha SureClick 140 MG/ML Soaj Generic drug: Evolocumab INJECT CONTENTS OF ONE PEN UNDER THE SKIN EVERY 14 DAYS   Vitamin D3 50 MCG (2000 UT) Tabs Take 2,000 Units by mouth 2 (two) times daily.        Day of Discharge BP (!) 166/74 (BP Location: Left Arm)   Pulse 71   Temp 97.8 F (36.6 C) (Oral)   Resp 18   Ht 5\' 1"  (1.549 m)   Wt 61.2 kg   SpO2 99%   BMI 25.49 kg/m   Physical Exam: General: No acute respiratory distress Lungs: Clear to auscultation bilaterally without wheezes or crackles Cardiovascular: Regular rate and rhythm without murmur gallop or rub normal S1 and S2 Abdomen: Nontender, nondistended, soft, bowel sounds positive, no rebound, no ascites, no appreciable mass Extremities: No significant cyanosis, clubbing, or edema  bilateral lower extremities  Basic Metabolic Panel: Recent Labs  Lab 07/29/23 1021 07/30/23 0405  NA 123* 129*  K 4.9 4.1  CL 93* 101  CO2 24 22  GLUCOSE 161* 122*  BUN 16 12  CREATININE 0.62 0.53  CALCIUM 9.1 8.6*    CBC: Recent Labs  Lab 07/29/23 1021 07/30/23 0405  WBC 9.4 8.5  HGB 12.9 12.2  HCT 35.6* 34.1*  MCV 87.9 88.6  PLT 322 301    Time spent in discharge (includes decision making & examination of pt): 30 minutes  07/30/2023, 2:16 PM   Lonia Blood, MD [facility] Office  [phone]

## 2023-07-30 NOTE — Evaluation (Signed)
 Physical Therapy Evaluation and Discharge Patient Details Name: Brittany Figueroa MRN: [id number] DOB: 06/24/1940 Today's Date: 07/30/2023  History of Present Illness  Pt is 83 yo female who presents on 07/29/23 with LLQ abdominal pain that has been intermittent 4-5 days and became constant. Pt with hyponatremia. PMH: HTN, GERD, CAD, HLD, hypothyroid, C4-7 ACDF  Clinical Impression  Patient evaluated by Physical Therapy with no further acute PT needs identified. All education has been completed and the patient has no further questions. Pt from home alone and is independent. She relays that she has had some orthopedic issues with her feet and has been less mobile recently. Pt noted to be independent but cautious with gait in hallway. Recommend outpt PT for balance before issues worsen. No further acute PT needs.   See below for any follow-up Physical Therapy or equipment needs. PT is signing off. Thank you for this referral.         If plan is discharge home, recommend the following:  Outpt PT for balance   Can travel by private vehicle    yes    Equipment Recommendations None recommended by PT  Recommendations for Other Services       Functional Status Assessment Patient has not had a recent decline in their functional status     Precautions / Restrictions Precautions Precautions: None Restrictions Weight Bearing Restrictions Per Provider Order: No      Mobility  Bed Mobility Overal bed mobility: Independent                  Transfers Overall transfer level: Independent Equipment used: None                    Ambulation/Gait Ambulation/Gait assistance: Modified independent (Device/Increase time) Gait Distance (Feet): 200 Feet Assistive device: None Gait Pattern/deviations: Step-through pattern Gait velocity: WFL Gait velocity interpretation: >4.37 ft/sec, indicative of normal walking speed   General Gait Details: pt cautious at times with obstacle  navigation and changes in direction. No LOB  Stairs Stairs: Yes Stairs assistance: Supervision Stair Management: One rail Right, Alternating pattern, Forwards Number of Stairs: 4 General stair comments: number limited by IV line. Safe with use of rail  Wheelchair Mobility     Tilt Bed    Modified Rankin (Stroke Patients Only)       Balance Overall balance assessment: Mild deficits observed, not formally tested                                           Pertinent Vitals/Pain Pain Assessment Pain Assessment: Faces Faces Pain Scale: Hurts a little bit Pain Location: LLQ Pain Descriptors / Indicators: Aching Pain Intervention(s): Limited activity within patient's tolerance, Monitored during session    Home Living Family/patient expects to be discharged to:: Private residence Living Arrangements: Alone Available Help at Discharge: Friend(s);Available PRN/intermittently;Family Type of Home: House Home Access: Stairs to enter Entrance Stairs-Rails: None Entrance Stairs-Number of Steps: 3 Alternate Level Stairs-Number of Steps: 13 Home Layout: Two level;Able to live on main level with bedroom/bathroom Home Equipment: None      Prior Function Prior Level of Function : Independent/Modified Independent;Driving                     Extremity/Trunk Assessment   Upper Extremity Assessment Upper Extremity Assessment: Overall WFL for tasks assessed    Lower Extremity Assessment  Lower Extremity Assessment: Overall WFL for tasks assessed (does not have increased pain with L hip flex)    Cervical / Trunk Assessment Cervical / Trunk Assessment: Normal  Communication   Communication Communication: No apparent difficulties    Cognition Arousal: Alert Behavior During Therapy: WFL for tasks assessed/performed   PT - Cognitive impairments: No apparent impairments                       PT - Cognition Comments: repeats self  occasionally Following commands: Intact       Cueing Cueing Techniques: Verbal cues     General Comments General comments (skin integrity, edema, etc.): when discussing caution used with ambulation pt reports she has had some trouble with her feet that has affected her balance and led to her being less active.    Exercises     Assessment/Plan    PT Assessment All further PT needs can be met in the next venue of care  PT Problem List Decreased balance       PT Treatment Interventions      PT Goals (Current goals can be found in the Care Plan section)  Acute Rehab PT Goals Patient Stated Goal: return home PT Goal Formulation: All assessment and education complete, DC therapy    Frequency       Co-evaluation               AM-PAC PT "6 Clicks" Mobility  Outcome Measure Help needed turning from your back to your side while in a flat bed without using bedrails?: None Help needed moving from lying on your back to sitting on the side of a flat bed without using bedrails?: None Help needed moving to and from a bed to a chair (including a wheelchair)?: None Help needed standing up from a chair using your arms (e.g., wheelchair or bedside chair)?: None Help needed to walk in hospital room?: None Help needed climbing 3-5 steps with a railing? : None 6 Click Score: 24    End of Session   Activity Tolerance: Patient tolerated treatment well Patient left: in chair;with call bell/phone within reach Nurse Communication: Mobility status PT Visit Diagnosis: Unsteadiness on feet (R26.81)    Time: 4098-1191 PT Time Calculation (min) (ACUTE ONLY): 24 min   Charges:   PT Evaluation $PT Eval Moderate Complexity: 1 Mod PT Treatments $Gait Training: 8-22 mins PT General Charges $$ ACUTE PT VISIT: 1 Visit         Lyanne Co, PT  Acute Rehab Services Secure chat preferred Office [phone]   Brittany Figueroa 07/30/2023, 1:56 PM

## 2023-07-30 NOTE — Progress Notes (Signed)
 DISCHARGE NOTE HOME MANUEL LAWHEAD to be discharged Home per MD order. Discussed prescriptions and follow up appointments with the patient. Prescriptions given to patient; medication list explained in detail. Patient verbalized understanding.  Skin clean, dry and intact without evidence of skin break down, no evidence of skin tears noted. IV catheter discontinued intact. Site without signs and symptoms of complications. Dressing and pressure applied. Pt denies pain at the site currently. No complaints noted.  Patient free of lines, drains, and wounds.   An After Visit Summary (AVS) was printed, reviewed with pt  and given to the patient. Patient escorted via wheelchair, and discharged home via private auto.  Irwin Brakeman, RN

## 2023-07-30 NOTE — Progress Notes (Signed)
 New Admission Note:   Arrival Method: stretcher Mental Orientation: alert and orient x 4 Telemetry: tele Assessment: Completed Skin: see flow sheet IV: NS infusing Pain: none Tubes: none Safety Measures: Safety Fall Prevention Plan has been discussed Admission: Completed [facility] Orientation: Patient has been orientated to the room, unit and staff.  Family: none  Orders have been reviewed and implemented. Will continue to monitor the patient. Call light has been placed within reach and bed alarm has been activated.   Artemio Aly BSN, RN Phone number: [phone]

## 2023-07-30 NOTE — Plan of Care (Signed)

## 2023-07-30 NOTE — TOC Transition Note (Signed)
 Transition of Care [facility]) - Discharge Note   Patient Details  Name: Brittany Figueroa MRN: [id number] Date of Birth: 1940-06-13  [facility] of [facility] CM/SW Contact:  Tom-Johnson, Hershal Coria, RN Phone Number: 07/30/2023, 3:02 PM   Clinical Narrative:     Patient is scheduled for discharge today.  Outpatient PT referral, hospital f/u and discharge instructions on AVS. Sister, Tyler Aas, to transport at discharge.  No further TOC needs noted.        Patient Goals and CMS Choice            Discharge Placement                       Discharge Plan and Services Additional resources added to the After Visit Summary for                                       Social Drivers of Health (SDOH) Interventions SDOH Screenings   Food Insecurity: No Food Insecurity (07/29/2023)  Housing: Low Risk  (07/29/2023)  Transportation Needs: No Transportation Needs (07/29/2023)  Utilities: Not At Risk (07/29/2023)  Social Connections: Moderately Isolated (07/29/2023)  Tobacco Use: Medium Risk (07/29/2023)     Readmission Risk Interventions     No data to display

## 2023-07-30 NOTE — Care Management Obs Status (Signed)
 MEDICARE OBSERVATION STATUS NOTIFICATION   Patient Details  Name: Brittany Figueroa MRN: [id number] Date of Birth: 17-Aug-1940   Medicare Observation Status Notification Given:  Yes    Tom-Johnson, Hershal Coria, RN 07/30/2023, 2:53 PM

## 2023-08-02 ENCOUNTER — Other Ambulatory Visit: Payer: Self-pay | Admitting: Cardiovascular Disease

## 2023-08-02 DIAGNOSIS — I2510 Atherosclerotic heart disease of native coronary artery without angina pectoris: Secondary | ICD-10-CM

## 2023-08-02 DIAGNOSIS — E785 Hyperlipidemia, unspecified: Secondary | ICD-10-CM

## 2023-08-04 DIAGNOSIS — H353211 Exudative age-related macular degeneration, right eye, with active choroidal neovascularization: Secondary | ICD-10-CM | POA: Diagnosis not present

## 2023-08-04 DIAGNOSIS — H353122 Nonexudative age-related macular degeneration, left eye, intermediate dry stage: Secondary | ICD-10-CM | POA: Diagnosis not present

## 2023-08-04 DIAGNOSIS — H2513 Age-related nuclear cataract, bilateral: Secondary | ICD-10-CM | POA: Diagnosis not present

## 2023-08-04 DIAGNOSIS — H43813 Vitreous degeneration, bilateral: Secondary | ICD-10-CM | POA: Diagnosis not present

## 2023-08-04 DIAGNOSIS — H35033 Hypertensive retinopathy, bilateral: Secondary | ICD-10-CM | POA: Diagnosis not present

## 2023-08-12 DIAGNOSIS — D225 Melanocytic nevi of trunk: Secondary | ICD-10-CM | POA: Diagnosis not present

## 2023-08-12 DIAGNOSIS — L814 Other melanin hyperpigmentation: Secondary | ICD-10-CM | POA: Diagnosis not present

## 2023-08-12 DIAGNOSIS — L821 Other seborrheic keratosis: Secondary | ICD-10-CM | POA: Diagnosis not present

## 2023-08-12 DIAGNOSIS — L820 Inflamed seborrheic keratosis: Secondary | ICD-10-CM | POA: Diagnosis not present

## 2023-08-12 DIAGNOSIS — Z08 Encounter for follow-up examination after completed treatment for malignant neoplasm: Secondary | ICD-10-CM | POA: Diagnosis not present

## 2023-08-12 DIAGNOSIS — L538 Other specified erythematous conditions: Secondary | ICD-10-CM | POA: Diagnosis not present

## 2023-08-12 DIAGNOSIS — Z85828 Personal history of other malignant neoplasm of skin: Secondary | ICD-10-CM | POA: Diagnosis not present

## 2023-08-13 ENCOUNTER — Other Ambulatory Visit: Payer: Self-pay

## 2023-08-13 ENCOUNTER — Ambulatory Visit: Attending: Internal Medicine

## 2023-08-13 DIAGNOSIS — R2689 Other abnormalities of gait and mobility: Secondary | ICD-10-CM | POA: Insufficient documentation

## 2023-08-13 DIAGNOSIS — R1032 Left lower quadrant pain: Secondary | ICD-10-CM | POA: Diagnosis not present

## 2023-08-13 DIAGNOSIS — R531 Weakness: Secondary | ICD-10-CM | POA: Diagnosis not present

## 2023-08-13 NOTE — Therapy (Signed)
 OUTPATIENT PHYSICAL THERAPY EVALUATION   Patient Name: Brittany Figueroa MRN: [id number] DOB:[birth year], 83 y.o., female Today's Date: 08/13/2023  END OF SESSION:  PT End of Session - 08/13/23 1005     Visit Number 1    Number of Visits 9    Date for PT Re-Evaluation 09/10/23    Authorization Type [employer] MCR    PT Start Time 1005    PT Stop Time 1043    PT Time Calculation (min) 38 min    Behavior During Therapy WFL for tasks assessed/performed             Past Medical History:  Diagnosis Date   Abnormal finding on EKG, new anterolateral T-wave inversions  05/14/2012   Abnormal nuclear stress test, with infero-lateral ischemia 05/14/2012   Arthritis    "right thumb; all my fingers" (05/13/2012), cervical spondylosis    Atypical angina (HCC) 05/14/2012   CAD (coronary artery disease), 05/13/12, with 80% LAD 05/14/2012   Cancer (HCC)    basal cell on facial in L temporal region     [facility] arthritis, thumb, degenerative    Complication of anesthesia 12/2009   "had endoscopy; larynx went into spasms; stopped breathing for 15 seconds" (05/13/2012)   GERD (gastroesophageal reflux disease) 2011   Hypercholesteremia    Hypertension    S/P angioplasty with stent, LAD 05/13/12 05/14/2012   Tuberculosis    test +- [place] [facility] - Dr. Ronne Binning- CXR- OK   Past Surgical History:  Procedure Laterality Date   ANTERIOR CERVICAL DECOMP/DISCECTOMY FUSION N/A 12/13/2013   Procedure: ANTERIOR CERVICAL DECOMPRESSION/DISCECTOMY FUSION 3 LEVELS Cervical four/five,five/six,six/seven. Anterior cervical disectomy and fusion with peek and plate ;  Surgeon: Temple Pacini, MD;  Location: [facility] NEURO ORS;  Service: Neurosurgery;  Laterality: N/A;   CARDIAC CATHETERIZATION N/A 07/02/2015   Procedure: Left Heart Cath and Coronary Angiography;  Surgeon: Runell Gess, MD;  Location: [facility] INVASIVE CV LAB;  Service: Cardiovascular;  Laterality: N/A;   CORONARY ANGIOPLASTY WITH STENT PLACEMENT  05/13/2012   DES to LAD;  she has total RCA with left to rt coll. normal LV function done for positive nuc study   DILATION AND CURETTAGE OF UTERUS  1960's; 1970's; 1980   "probably 3" (05/13/2012)   FOREARM / WRIST TENDON LESION EXCISION  ?11/2001   "right; did waver to try to get ulnar out of hole that it had cut" (05/13/2012   INGUINAL HERNIA REPAIR  ~ 1966; 07/21/2002   "right; left" (05/13/2012)   LEFT HEART CATHETERIZATION WITH CORONARY ANGIOGRAM N/A 05/13/2012   Procedure: LEFT HEART CATHETERIZATION WITH CORONARY ANGIOGRAM;  Surgeon: Runell Gess, MD;  Location: [facility] CATH LAB;  Service: Cardiovascular;  Laterality: N/A;   OSTEOTOMY AND ULNAR SHORTENING  07/21/2002   "right" (05/13/2012)   TONSILLECTOMY AND ADENOIDECTOMY  1950's   TUBAL LIGATION  1980   Patient Active Problem List   Diagnosis Date Noted   Abdominal pain 07/29/2023   Right bundle branch block 11/19/2022   Osteopenia after menopause 10/11/2021   Nuclear sclerotic cataract of both eyes 07/09/2021   Vitreous floaters of right eye 08/13/2020   Exudative age-related macular degeneration of right eye with active choroidal neovascularization (HCC) 07/04/2020   Intermediate stage nonexudative age-related macular degeneration of both eyes 07/04/2020   Hyponatremia 06/27/2017   Prediabetes 05/13/2015   Spondylosis, cervical, with myelopathy 12/13/2013   Cervical spondylosis with myelopathy 12/13/2013   Colon cancer screening 05/25/2013   Dyspnea 12/13/2012   Yeast infection of the skin:  under both breasts 12/13/2012   Abnormal finding on EKG, new anterolateral T-wave inversions  05/14/2012   Abnormal nuclear stress test, with infero-lateral ischemia 05/14/2012   Atypical angina (HCC) 05/14/2012   CAD- residual total RCA and Dx disease 05/14/2012   S/P angioplasty with stent, LAD 05/13/12 05/14/2012   Dyslipidemia 05/14/2012   Essential hypertension 05/14/2012    PCP: Emilio Aspen, MD  REFERRING PROVIDER: Lonia Blood,  MD  REFERRING DIAG: R10.32 (ICD-10-CM) - LLQ abdominal pain R53.1 (ICD-10-CM) - Weakness  THERAPY DIAG:  Weakness  Other abnormalities of gait and mobility  Rationale for Evaluation and Treatment: Rehabilitation  ONSET DATE: 07/30/2023  SUBJECTIVE:   SUBJECTIVE STATEMENT: Patient reports to PT 2 weeks s/p hospital admission for LLQ abdominal pain. She states that she was told she needed PT to address her balance. Patient states "I think my balance is good, but it's just that I was attached to that IV."    PERTINENT HISTORY: Relevant PMHx includes atypical angina , CAD, HTN, ACDF, osteopenia, hyponatremia, cervical spondylosis with myelopathy, coronary angioplasty with stent placement.   PAIN:  Are you having pain?  No  PRECAUTIONS: None  RED FLAGS: None   WEIGHT BEARING RESTRICTIONS: No  FALLS:  Has patient fallen in last 6 months? Yes. Number of falls 1, slipped on standing water in bathroom at a restaurant   LIVING ENVIRONMENT: Lives with: lives alone Lives in: House/apartment Stairs: No Has following equipment at home:  "I'm going to get some grab bars for my shower."   OCCUPATION: retired  PLOF: Independent and Leisure: walking and socialize with some friends in [place]   PATIENT GOALS: "I'll work on whatever you say I need to work on."   NEXT MD VISIT: 11/05/2023 with Endo  OBJECTIVE:  Note: Objective measures were completed at Evaluation unless otherwise noted.  DIAGNOSTIC FINDINGS:   07/29/2023 CT abdomen pelvis w/contrast   IMPRESSION: No bowel obstruction, free air or free fluid. Slight wall thickening along the sigmoid colon which could relate to level of distention. Please correlate for any particular symptoms of a colitis.   Normal appendix.   Moderate hiatal hernia.   Prominent vascular calcifications along the aorta and branch vessels with areas of potential significant stenosis along the common iliac arteries. Please correlate with any  symptoms.   PATIENT SURVEYS:  ABC scale to be provided at follow up visit  COGNITION: Overall cognitive status: Within functional limits for tasks assessed     LOWER EXTREMITY MMT:  MMT Right eval Left eval  Hip flexion 5 5  Hip extension    Hip abduction 5 5  Hip adduction 5 5  Hip internal rotation    Hip external rotation    Knee flexion 5 5  Knee extension 5 5  Ankle dorsiflexion 4+ 4+  Ankle plantarflexion    Ankle inversion    Ankle eversion     (Blank rows = not tested)   FUNCTIONAL TESTS:  5 times sit to stand: trial 1: 14 sec, 11 sec   MCTSIB:  Condition 1 - 30 sec  Condition 2 - 30 sec Condition 3 - 30 sec Condition 4 - 30 sec, mild sway   Tandem Stance time:   L: 12 sec (semi tandem)   R: 15 sec (semi tandem)  SLS time   L: 3 sec  R: 3 sec   GAIT: Distance walked: 30 feet from lobby to evaluation room  Assistive device utilized: None Level of assistance: Complete Independence  Comments: slightly decreased stride length BIL                                                                                                                                 TREATMENT DATE:   [facility] Adult PT Treatment:                                                DATE: 08/13/2023   Initial evaluation: see patient education and home exercise program as noted below      PATIENT EDUCATION:  Education details: reviewed initial home exercise program; discussion of POC, prognosis and goals for skilled PT   Person educated: Patient Education method: Explanation, Demonstration, and Handouts Education comprehension: verbalized understanding, verbal cues required, and needs further education  HOME EXERCISE PROGRAM: To be provided at follow up visit.   ASSESSMENT:  CLINICAL IMPRESSION: Ninel is a 83 y.o. female who was seen today for physical therapy evaluation and treatment 2 weeks s/p recent hospitalization for LLQ abdominal pain. She is demonstrating decreased ankle DF  MMT scores BIL, decreased tandem stance and SLS time BIL. She requires skilled PT services at this time to address relevant deficits and to maximize safe and independent function.     OBJECTIVE IMPAIRMENTS: decreased balance.   ACTIVITY LIMITATIONS: dressing  PARTICIPATION LIMITATIONS: community activity  PERSONAL FACTORS: Age, Past/current experiences, and 3+ comorbidities: Relevant PMHx includes atypical angina , CAD, HTN, ACDF, osteopenia, hyponatremia, cervical spondylosis with myelopathy, coronary angioplasty with stent placement.   are also affecting patient's functional outcome.   REHAB POTENTIAL: Good  CLINICAL DECISION MAKING: Stable/uncomplicated  EVALUATION COMPLEXITY: Low   GOALS: Goals reviewed with patient? Yes  SHORT TERM GOALS = LONG TERM GOALS: Target date: 09/10/2023   Patient will report at least 80% confidence on Activity-Specific Balance Confidence scale in order to demonstrate low fall risk.  Baseline: to be assessed at first follow up  Goal status: INITIAL  2.  Patient will score at least 23/30 on Functional Gait Asessment.  Baseline: to be assessed at first follow up  Goal status: INITIAL  3.  Patient will verbalize 2-3 strategies for decreasing risk of falls.  Baseline: to be discussed at follow up visits Goal status: INITIAL  4.  Patient will demonstrate 5/5 BIL ankle dorsiflexion strength.  Baseline: 4+/5 BIL Goal status: INITIAL  5.  Patient will be independent with initial home program for [place] strengthening, and balance with narrow BOS.  Baseline: to be provided at follow up visit Goal status: INITIAL  6. Patient will demonstrate improved tandem stance time to at least 20 seconds BIL   Baseline: see objective measures   Goal Status: INITIAL  7. Patient will demonstrate improved SLS time to at least 15 seconds BIL.   Baseline: see objective measures  Goal  Status: INITIAL      PLAN:  PT FREQUENCY: 1x/week  PT DURATION: 4  weeks  PLANNED INTERVENTIONS: 97164- PT Re-evaluation, 97110-Therapeutic exercises, 97530- Therapeutic activity, 97112- Neuromuscular re-education, 97535- Self Care, 16109- Manual therapy, Patient/Family education, and Balance training  PLAN FOR NEXT SESSION: perform FGA assessment and complete ABC scale; address static standing balance with narrow base of support, address SLS balance, education for decreased fall risk, create and provide initial HEP (including tandem stance, standing marches)   Mauri Reading, PT, DPT  08/14/2023 11:17 AM   Date of referral: 07/30/2023 Referring provider:  Lonia Blood, MD    Referring diagnosis?  R10.32 (ICD-10-CM) - LLQ abdominal pain  R53.1 (ICD-10-CM) - Weakness   Treatment diagnosis? (if different than referring diagnosis)   Weakness  Other abnormalities of gait and mobility  What was this (referring dx) caused by? Unspecified  Nature of Condition: Initial Onset (within last 3 months)   Laterality: Both  Current Functional Measure Score: Other To be updated at first f/u visit   Objective measurements identify impairments when they are compared to normal values, the uninvolved extremity, and prior level of function.  [x]  Yes  []  No  Objective assessment of functional ability: Minimal functional limitations   Briefly describe symptoms: impaired balance   How did symptoms start: recent hospitalization   Average pain intensity:  Last 24 hours: 0/10  Past week: 0/10  How often does the pt experience symptoms? Intermittently  How much have the symptoms interfered with usual daily activities? A little bit  How has condition changed since care began at this facility? NA - initial visit  In general, how is the patients overall health? Fair   BACK PAIN (STarT Back Screening Tool) No

## 2023-08-14 ENCOUNTER — Telehealth: Payer: Self-pay | Admitting: Cardiovascular Disease

## 2023-08-14 NOTE — Telephone Encounter (Signed)
 Pt c/o medication issue:  1. Name of Medication: Evolocumab (REPATHA SURECLICK) 140 MG/ML SOAJ   2. How are you currently taking this medication (dosage and times per day)? As written  3. Are you having a reaction (difficulty breathing--STAT)? no  4. What is your medication issue? Needs [place] medication

## 2023-08-20 ENCOUNTER — Ambulatory Visit: Attending: Internal Medicine

## 2023-08-20 DIAGNOSIS — R531 Weakness: Secondary | ICD-10-CM | POA: Insufficient documentation

## 2023-08-20 DIAGNOSIS — R2689 Other abnormalities of gait and mobility: Secondary | ICD-10-CM | POA: Diagnosis not present

## 2023-08-20 NOTE — Therapy (Signed)
 OUTPATIENT PHYSICAL THERAPY TREATMENT NOTE   Patient Name: Brittany Figueroa MRN: [id number] DOB:[birth year], 83 y.o., female Today's Date: 08/20/2023  END OF SESSION:  PT End of Session - 08/20/23 0959     Visit Number 2    Number of Visits 9    Date for PT Re-Evaluation 09/10/23    Authorization Type [employer] MCR    PT Start Time 1000    PT Stop Time 1038    PT Time Calculation (min) 38 min    Activity Tolerance Patient tolerated treatment well    Behavior During Therapy WFL for tasks assessed/performed              Past Medical History:  Diagnosis Date   Abnormal finding on EKG, new anterolateral T-wave inversions  05/14/2012   Abnormal nuclear stress test, with infero-lateral ischemia 05/14/2012   Arthritis    "right thumb; all my fingers" (05/13/2012), cervical spondylosis    Atypical angina (HCC) 05/14/2012   CAD (coronary artery disease), 05/13/12, with 80% LAD 05/14/2012   Cancer (HCC)    basal cell on facial in L temporal region     [facility] arthritis, thumb, degenerative    Complication of anesthesia 12/2009   "had endoscopy; larynx went into spasms; stopped breathing for 15 seconds" (05/13/2012)   GERD (gastroesophageal reflux disease) 2011   Hypercholesteremia    Hypertension    S/P angioplasty with stent, LAD 05/13/12 05/14/2012   Tuberculosis    test +- [place] [facility] - Dr. Ronne Binning- CXR- OK   Past Surgical History:  Procedure Laterality Date   ANTERIOR CERVICAL DECOMP/DISCECTOMY FUSION N/A 12/13/2013   Procedure: ANTERIOR CERVICAL DECOMPRESSION/DISCECTOMY FUSION 3 LEVELS Cervical four/five,five/six,six/seven. Anterior cervical disectomy and fusion with peek and plate ;  Surgeon: Temple Pacini, MD;  Location: [facility];  Service: Neurosurgery;  Laterality: N/A;   CARDIAC CATHETERIZATION N/A 07/02/2015   Procedure: Left Heart Cath and Coronary Angiography;  Surgeon: Runell Gess, MD;  Location: [facility] INVASIVE CV LAB;  Service: Cardiovascular;  Laterality: N/A;   CORONARY  ANGIOPLASTY WITH STENT PLACEMENT  05/13/2012   DES to LAD; she has total RCA with left to rt coll. normal LV function done for positive nuc study   DILATION AND CURETTAGE OF UTERUS  1960's; 1970's; 1980   "probably 3" (05/13/2012)   FOREARM / WRIST TENDON LESION EXCISION  ?11/2001   "right; did waver to try to get ulnar out of hole that it had cut" (05/13/2012   INGUINAL HERNIA REPAIR  ~ 1966; 07/21/2002   "right; left" (05/13/2012)   LEFT HEART CATHETERIZATION WITH CORONARY ANGIOGRAM N/A 05/13/2012   Procedure: LEFT HEART CATHETERIZATION WITH CORONARY ANGIOGRAM;  Surgeon: Runell Gess, MD;  Location: [facility] CATH LAB;  Service: Cardiovascular;  Laterality: N/A;   OSTEOTOMY AND ULNAR SHORTENING  07/21/2002   "right" (05/13/2012)   TONSILLECTOMY AND ADENOIDECTOMY  1950's   TUBAL LIGATION  1980   Patient Active Problem List   Diagnosis Date Noted   Abdominal pain 07/29/2023   Right bundle branch block 11/19/2022   Osteopenia after menopause 10/11/2021   Nuclear sclerotic cataract of both eyes 07/09/2021   Vitreous floaters of right eye 08/13/2020   Exudative age-related macular degeneration of right eye with active choroidal neovascularization (HCC) 07/04/2020   Intermediate stage nonexudative age-related macular degeneration of both eyes 07/04/2020   Hyponatremia 06/27/2017   Prediabetes 05/13/2015   Spondylosis, cervical, with myelopathy 12/13/2013   Cervical spondylosis with myelopathy 12/13/2013   Colon cancer screening 05/25/2013  Dyspnea 12/13/2012   Yeast infection of the skin: under both breasts 12/13/2012   Abnormal finding on EKG, new anterolateral T-wave inversions  05/14/2012   Abnormal nuclear stress test, with infero-lateral ischemia 05/14/2012   Atypical angina (HCC) 05/14/2012   CAD- residual total RCA and Dx disease 05/14/2012   S/P angioplasty with stent, LAD 05/13/12 05/14/2012   Dyslipidemia 05/14/2012   Essential hypertension 05/14/2012    PCP: Emilio Aspen, MD  REFERRING PROVIDER: Lonia Blood, MD  REFERRING DIAG: R10.32 (ICD-10-CM) - LLQ abdominal pain R53.1 (ICD-10-CM) - Weakness  THERAPY DIAG:  Weakness  Other abnormalities of gait and mobility  Rationale for Evaluation and Treatment: Rehabilitation  ONSET DATE: 07/30/2023  SUBJECTIVE:   SUBJECTIVE STATEMENT:  Patient denies any recent falls.   EVAL: Patient reports to PT 2 weeks s/p hospital admission for LLQ abdominal pain. She states that she was told she needed PT to address her balance. Patient states "I think my balance is good, but it's just that I was attached to that IV."    PERTINENT HISTORY: Relevant PMHx includes atypical angina , CAD, HTN, ACDF, osteopenia, hyponatremia, cervical spondylosis with myelopathy, coronary angioplasty with stent placement.   PAIN:  Are you having pain?  No  PRECAUTIONS: None  RED FLAGS: None   WEIGHT BEARING RESTRICTIONS: No  FALLS:  Has patient fallen in last 6 months? Yes. Number of falls 1, slipped on standing water in bathroom at a restaurant   LIVING ENVIRONMENT: Lives with: lives alone Lives in: House/apartment Stairs: No Has following equipment at home:  "I'm going to get some grab bars for my shower."   OCCUPATION: retired  PLOF: Independent and Leisure: walking and socialize with some friends in [place]   PATIENT GOALS: "I'll work on whatever you say I need to work on."   NEXT MD VISIT: 11/05/2023 with Endo  OBJECTIVE:  Note: Objective measures were completed at Evaluation unless otherwise noted.  DIAGNOSTIC FINDINGS:   07/29/2023 CT abdomen pelvis w/contrast   IMPRESSION: No bowel obstruction, free air or free fluid. Slight wall thickening along the sigmoid colon which could relate to level of distention. Please correlate for any particular symptoms of a colitis.   Normal appendix.   Moderate hiatal hernia.   Prominent vascular calcifications along the aorta and branch  vessels with areas of potential significant stenosis along the common iliac arteries. Please correlate with any symptoms.   PATIENT SURVEYS:  ABC scale to be provided at follow up visit  COGNITION: Overall cognitive status: Within functional limits for tasks assessed     LOWER EXTREMITY MMT:  MMT Right eval Left eval  Hip flexion 5 5  Hip extension    Hip abduction 5 5  Hip adduction 5 5  Hip internal rotation    Hip external rotation    Knee flexion 5 5  Knee extension 5 5  Ankle dorsiflexion 4+ 4+  Ankle plantarflexion    Ankle inversion    Ankle eversion     (Blank rows = not tested)   FUNCTIONAL TESTS:  5 times sit to stand: trial 1: 14 sec, 11 sec   MCTSIB:  Condition 1 - 30 sec  Condition 2 - 30 sec Condition 3 - 30 sec Condition 4 - 30 sec, mild sway   Tandem Stance time:   L: 12 sec (semi tandem)   R: 15 sec (semi tandem)  SLS time   L: 3 sec  R: 3 sec   GAIT: Distance  walked: 30 feet from lobby to evaluation room  Assistive device utilized: None Level of assistance: Complete Independence Comments: slightly decreased stride length BIL                                                                                                                                 TREATMENT DATE:   08/20/2023  FGA assessment  Hurdles (3) x 3 laps forward Hurdles (3) x 3 laps sideways  Airex beam tandem walking x 3 laps  SLS on airex cone taps (4) x 30 sec each side  Recumbent bike at end of session, level 1 x 5 minutes for cool down  Created and reviewed HEP    [facility] Adult PT Treatment:                                                DATE: 08/13/2023   Initial evaluation: see patient education and home exercise program as noted below      PATIENT EDUCATION:  Education details: reviewed initial home exercise program; discussion of POC, prognosis and goals for skilled PT   Person educated: Patient Education method: Explanation, Demonstration, and  Handouts Education comprehension: verbalized understanding, verbal cues required, and needs further education  HOME EXERCISE PROGRAM: Access Code: [email or web address] URL: [email or web address] Date: 08/20/2023 Prepared by: Mauri Reading  Exercises - Standing Marching  - 1 x daily - 4 x weekly - 2 sets - 10 reps - Tandem Stance in Corner  - 1 x daily - 4 x weekly - 2 sets - 30 sec hold - Sit to Stand with Counter Support  - 1 x daily - 4 x weekly - 2 sets - 10 reps  ASSESSMENT:  CLINICAL IMPRESSION: Patient had good tolerance of initial treatment session.  She demonstrates most difficulty today with navigating narrow base of support, ambulating backwards, and maintaining gait with vertical head turns.  Performed FGA assessment which does indicate risk of falls.  Created and reviewed initial home exercise program with patient at end of session.  We will continue to progress per plan of care in order to address established rehab goals.   EVAL: Fate is a 83 y.o. female who was seen today for physical therapy evaluation and treatment 2 weeks s/p recent hospitalization for LLQ abdominal pain. She is demonstrating decreased ankle DF MMT scores BIL, decreased tandem stance and SLS time BIL. She requires skilled PT services at this time to address relevant deficits and to maximize safe and independent function.     OBJECTIVE IMPAIRMENTS: decreased balance.   ACTIVITY LIMITATIONS: dressing  PARTICIPATION LIMITATIONS: community activity  PERSONAL FACTORS: Age, Past/current experiences, and 3+ comorbidities: Relevant PMHx includes atypical angina , CAD, HTN, ACDF, osteopenia, hyponatremia, cervical spondylosis with myelopathy, coronary angioplasty with stent placement.   are also affecting patient's functional  outcome.   REHAB POTENTIAL: Good  CLINICAL DECISION MAKING: Stable/uncomplicated  EVALUATION COMPLEXITY: Low   GOALS: Goals reviewed with patient? Yes  SHORT TERM GOALS = LONG  TERM GOALS: Target date: 09/10/2023   Patient will report at least 80% confidence on Activity-Specific Balance Confidence scale in order to demonstrate low fall risk.  Baseline: to be assessed at first follow up  Goal status: INITIAL  2.  Patient will score at least 23/30 on Functional Gait Asessment.  Baseline: 20/30 on 08/20/23  Most limited with "gait with vertical head turns," "gait with narrow BOS," and ambulating backwards  Goal status: INITIAL  3.  Patient will verbalize 2-3 strategies for decreasing risk of falls.  Baseline: to be discussed at follow up visits Goal status: INITIAL  4.  Patient will demonstrate 5/5 BIL ankle dorsiflexion strength.  Baseline: 4+/5 BIL Goal status: INITIAL  5.  Patient will be independent with initial home program for [place] strengthening, and balance with narrow BOS.  Baseline: to be provided at follow up visit Goal status: INITIAL  6. Patient will demonstrate improved tandem stance time to at least 20 seconds BIL   Baseline: see objective measures   Goal Status: INITIAL  7. Patient will demonstrate improved SLS time to at least 15 seconds BIL.   Baseline: see objective measures  Goal Status: INITIAL      PLAN:  PT FREQUENCY: 1x/week  PT DURATION: 4 weeks  PLANNED INTERVENTIONS: 97164- PT Re-evaluation, 97110-Therapeutic exercises, 97530- Therapeutic activity, 97112- Neuromuscular re-education, 97535- Self Care, [id number]- Manual therapy, Patient/Family education, and Balance training  PLAN FOR NEXT SESSION: complete ABC scale; address static standing balance with narrow base of support, address SLS balance, education for decreased fall risk, create and provide initial HEP (including tandem stance, standing marches)   Mauri Reading, PT, DPT  08/22/2023 9:42 PM   Date of referral: 07/30/2023 Referring provider:  Lonia Blood, MD    Referring diagnosis?  R10.32 (ICD-10-CM) - LLQ abdominal pain  R53.1 (ICD-10-CM) -  Weakness   Treatment diagnosis? (if different than referring diagnosis)   Weakness  Other abnormalities of gait and mobility  What was this (referring dx) caused by? Unspecified  Nature of Condition: Initial Onset (within last 3 months)   Laterality: Both  Current Functional Measure Score: Other To be updated at first f/u visit   Objective measurements identify impairments when they are compared to normal values, the uninvolved extremity, and prior level of function.  [x]  Yes  []  No  Objective assessment of functional ability: Minimal functional limitations   Briefly describe symptoms: impaired balance   How did symptoms start: recent hospitalization   Average pain intensity:  Last 24 hours: 0/10  Past week: 0/10  How often does the pt experience symptoms? Intermittently  How much have the symptoms interfered with usual daily activities? A little bit  How has condition changed since care began at this facility? NA - initial visit  In general, how is the patients overall health? Fair   BACK PAIN (STarT Back Screening Tool) No

## 2023-08-24 DIAGNOSIS — M545 Low back pain, unspecified: Secondary | ICD-10-CM | POA: Diagnosis not present

## 2023-08-25 ENCOUNTER — Ambulatory Visit

## 2023-08-25 DIAGNOSIS — R2689 Other abnormalities of gait and mobility: Secondary | ICD-10-CM

## 2023-08-25 DIAGNOSIS — R531 Weakness: Secondary | ICD-10-CM

## 2023-08-25 NOTE — Therapy (Signed)
 OUTPATIENT PHYSICAL THERAPY TREATMENT NOTE   Patient Name: Brittany Figueroa MRN: [id number] DOB:May 09, 1941, 83 y.o., female Today's Date: 08/25/2023  END OF SESSION:  PT End of Session - 08/25/23 1207     Visit Number 3    Number of Visits 9    Date for PT Re-Evaluation 09/10/23    Authorization Type [employer] MCR    PT Start Time 1215    PT Stop Time 1253    PT Time Calculation (min) 38 min             Past Medical History:  Diagnosis Date   Abnormal finding on EKG, new anterolateral T-wave inversions  05/14/2012   Abnormal nuclear stress test, with infero-lateral ischemia 05/14/2012   Arthritis    "right thumb; all my fingers" (05/13/2012), cervical spondylosis    Atypical angina (HCC) 05/14/2012   CAD (coronary artery disease), 05/13/12, with 80% LAD 05/14/2012   Cancer (HCC)    basal cell on facial in L temporal region     [facility] arthritis, thumb, degenerative    Complication of anesthesia 12/2009   "had endoscopy; larynx went into spasms; stopped breathing for 15 seconds" (05/13/2012)   GERD (gastroesophageal reflux disease) 2011   Hypercholesteremia    Hypertension    S/P angioplasty with stent, LAD 05/13/12 05/14/2012   Tuberculosis    test +- [place] [facility] - Dr. Ronne Binning- CXR- OK   Past Surgical History:  Procedure Laterality Date   ANTERIOR CERVICAL DECOMP/DISCECTOMY FUSION N/A 12/13/2013   Procedure: ANTERIOR CERVICAL DECOMPRESSION/DISCECTOMY FUSION 3 LEVELS Cervical four/five,five/six,six/seven. Anterior cervical disectomy and fusion with peek and plate ;  Surgeon: Temple Pacini, MD;  Location: [facility] NEURO ORS;  Service: Neurosurgery;  Laterality: N/A;   CARDIAC CATHETERIZATION N/A 07/02/2015   Procedure: Left Heart Cath and Coronary Angiography;  Surgeon: Runell Gess, MD;  Location: [facility] INVASIVE CV LAB;  Service: Cardiovascular;  Laterality: N/A;   CORONARY ANGIOPLASTY WITH STENT PLACEMENT  05/13/2012   DES to LAD; she has total RCA with left to rt coll. normal LV function  done for positive nuc study   DILATION AND CURETTAGE OF UTERUS  1960's; 1970's; 1980   "probably 3" (05/13/2012)   FOREARM / WRIST TENDON LESION EXCISION  ?11/2001   "right; did waver to try to get ulnar out of hole that it had cut" (05/13/2012   INGUINAL HERNIA REPAIR  ~ 1966; 07/21/2002   "right; left" (05/13/2012)   LEFT HEART CATHETERIZATION WITH CORONARY ANGIOGRAM N/A 05/13/2012   Procedure: LEFT HEART CATHETERIZATION WITH CORONARY ANGIOGRAM;  Surgeon: Runell Gess, MD;  Location: [facility] CATH LAB;  Service: Cardiovascular;  Laterality: N/A;   OSTEOTOMY AND ULNAR SHORTENING  07/21/2002   "right" (05/13/2012)   TONSILLECTOMY AND ADENOIDECTOMY  1950's   TUBAL LIGATION  1980   Patient Active Problem List   Diagnosis Date Noted   Abdominal pain 07/29/2023   Right bundle branch block 11/19/2022   Osteopenia after menopause 10/11/2021   Nuclear sclerotic cataract of both eyes 07/09/2021   Vitreous floaters of right eye 08/13/2020   Exudative age-related macular degeneration of right eye with active choroidal neovascularization (HCC) 07/04/2020   Intermediate stage nonexudative age-related macular degeneration of both eyes 07/04/2020   Hyponatremia 06/27/2017   Prediabetes 05/13/2015   Spondylosis, cervical, with myelopathy 12/13/2013   Cervical spondylosis with myelopathy 12/13/2013   Colon cancer screening 05/25/2013   Dyspnea 12/13/2012   Yeast infection of the skin: under both breasts 12/13/2012   Abnormal finding on  EKG, new anterolateral T-wave inversions  05/14/2012   Abnormal nuclear stress test, with infero-lateral ischemia 05/14/2012   Atypical angina (HCC) 05/14/2012   CAD- residual total RCA and Dx disease 05/14/2012   S/P angioplasty with stent, LAD 05/13/12 05/14/2012   Dyslipidemia 05/14/2012   Essential hypertension 05/14/2012    PCP: Emilio Aspen, MD  REFERRING PROVIDER: Lonia Blood, MD  REFERRING DIAG: R10.32 (ICD-10-CM) - LLQ abdominal pain R53.1  (ICD-10-CM) - Weakness  THERAPY DIAG:  Weakness  Other abnormalities of gait and mobility  Rationale for Evaluation and Treatment: Rehabilitation  ONSET DATE: 07/30/2023  SUBJECTIVE:   SUBJECTIVE STATEMENT:  Patient reports that the home exercises exacerbated some hip pain and her MD told her to stop the exercises if it aggravated her pain. She also states that she got an injection in the Rt knee yesterday,  EVAL: Patient reports to PT 2 weeks s/p hospital admission for LLQ abdominal pain. She states that she was told she needed PT to address her balance. Patient states "I think my balance is good, but it's just that I was attached to that IV."    PERTINENT HISTORY: Relevant PMHx includes atypical angina , CAD, HTN, ACDF, osteopenia, hyponatremia, cervical spondylosis with myelopathy, coronary angioplasty with stent placement.   PAIN:  Are you having pain?  No  PRECAUTIONS: None  RED FLAGS: None   WEIGHT BEARING RESTRICTIONS: No  FALLS:  Has patient fallen in last 6 months? Yes. Number of falls 1, slipped on standing water in bathroom at a restaurant   LIVING ENVIRONMENT: Lives with: lives alone Lives in: House/apartment Stairs: No Has following equipment at home:  "I'm going to get some grab bars for my shower."   OCCUPATION: retired  PLOF: Independent and Leisure: walking and socialize with some friends in [place]   PATIENT GOALS: "I'll work on whatever you say I need to work on."   NEXT MD VISIT: 11/05/2023 with Endo  OBJECTIVE:  Note: Objective measures were completed at Evaluation unless otherwise noted.  DIAGNOSTIC FINDINGS:   07/29/2023 CT abdomen pelvis w/contrast   IMPRESSION: No bowel obstruction, free air or free fluid. Slight wall thickening along the sigmoid colon which could relate to level of distention. Please correlate for any particular symptoms of a colitis.   Normal appendix.   Moderate hiatal hernia.   Prominent vascular  calcifications along the aorta and branch vessels with areas of potential significant stenosis along the common iliac arteries. Please correlate with any symptoms.   PATIENT SURVEYS:  ABC scale to be provided at follow up visit  COGNITION: Overall cognitive status: Within functional limits for tasks assessed     LOWER EXTREMITY MMT:  MMT Right eval Left eval  Hip flexion 5 5  Hip extension    Hip abduction 5 5  Hip adduction 5 5  Hip internal rotation    Hip external rotation    Knee flexion 5 5  Knee extension 5 5  Ankle dorsiflexion 4+ 4+  Ankle plantarflexion    Ankle inversion    Ankle eversion     (Blank rows = not tested)   FUNCTIONAL TESTS:  5 times sit to stand: trial 1: 14 sec, 11 sec   MCTSIB:  Condition 1 - 30 sec  Condition 2 - 30 sec Condition 3 - 30 sec Condition 4 - 30 sec, mild sway   Tandem Stance time:   L: 12 sec (semi tandem)   R: 15 sec (semi tandem)  SLS time  L: 3 sec  R: 3 sec   GAIT: Distance walked: 30 feet from lobby to evaluation room  Assistive device utilized: None Level of assistance: Complete Independence Comments: slightly decreased stride length BIL                                                                                                                                 TREATMENT DATE:  [facility] Adult PT Treatment:                                                DATE: 08/25/23 Therapeutic Exercise: Nustep level 3 x 6 mins while gathering subjective info Seated marching 2x30" Seated hip adduction 5" hold 2x10 Heel/toe raises 2x10 Step ups to aerobic step fwd/lat x10 ea BIL STS with Airex 2x10 (hands on knees) Neuromuscular re-ed: SLS on Airex cone taps (4) 2x30" BIL Tandem stance x30" BIL (LOB with step strategy to recover on RLE)  08/20/23  FGA assessment  Hurdles (3) x 3 laps forward Hurdles (3) x 3 laps sideways  Airex beam tandem walking x 3 laps  SLS on airex cone taps (4) x 30 sec each side  Recumbent  bike at end of session, level 1 x 5 minutes for cool down  Created and reviewed HEP    [facility] Adult PT Treatment:                                                DATE: 08/13/2023   Initial evaluation: see patient education and home exercise program as noted below      PATIENT EDUCATION:  Education details: reviewed initial home exercise program; discussion of POC, prognosis and goals for skilled PT   Person educated: Patient Education method: Explanation, Demonstration, and Handouts Education comprehension: verbalized understanding, verbal cues required, and needs further education  HOME EXERCISE PROGRAM: Access Code: [email or web address] URL: [email or web address] Date: 08/25/2023 Prepared by: Berta Minor  Exercises - Standing Marching  - 1 x daily - 4 x weekly - 1 sets - 10 reps - Tandem Stance in Corner  - 1 x daily - 4 x weekly - 1 sets - 30 sec hold - Sit to Stand with Counter Support  - 1 x daily - 4 x weekly - 1 sets - 10 reps  ASSESSMENT:  CLINICAL IMPRESSION: Patient presents to PT reporting increased pain in her hips from completing her home exercises, which she has stopped doing. She also received an injection in her Rt knee yesterday. Session today focused on proximal hip and LE strengthening with more seated activities today to accommodate for increased pain. Updated and reviewed HEP. Patient was able to tolerate all prescribed exercises  with no adverse effects. Patient continues to benefit from skilled PT services and should be progressed as able to improve functional independence.    EVAL: Vernita is a 83 y.o. female who was seen today for physical therapy evaluation and treatment 2 weeks s/p recent hospitalization for LLQ abdominal pain. She is demonstrating decreased ankle DF MMT scores BIL, decreased tandem stance and SLS time BIL. She requires skilled PT services at this time to address relevant deficits and to maximize safe and independent function.      OBJECTIVE IMPAIRMENTS: decreased balance.   ACTIVITY LIMITATIONS: dressing  PARTICIPATION LIMITATIONS: community activity  PERSONAL FACTORS: Age, Past/current experiences, and 3+ comorbidities: Relevant PMHx includes atypical angina , CAD, HTN, ACDF, osteopenia, hyponatremia, cervical spondylosis with myelopathy, coronary angioplasty with stent placement.   are also affecting patient's functional outcome.   REHAB POTENTIAL: Good  CLINICAL DECISION MAKING: Stable/uncomplicated  EVALUATION COMPLEXITY: Low   GOALS: Goals reviewed with patient? Yes  SHORT TERM GOALS = LONG TERM GOALS: Target date: 09/10/2023   Patient will report at least 80% confidence on Activity-Specific Balance Confidence scale in order to demonstrate low fall risk.  Baseline: to be assessed at first follow up  Goal status: INITIAL  2.  Patient will score at least 23/30 on Functional Gait Asessment.  Baseline: 20/30 on 08/20/23  Most limited with "gait with vertical head turns," "gait with narrow BOS," and ambulating backwards  Goal status: INITIAL  3.  Patient will verbalize 2-3 strategies for decreasing risk of falls.  Baseline: to be discussed at follow up visits Goal status: INITIAL  4.  Patient will demonstrate 5/5 BIL ankle dorsiflexion strength.  Baseline: 4+/5 BIL Goal status: INITIAL  5.  Patient will be independent with initial home program for [place] strengthening, and balance with narrow BOS.  Baseline: to be provided at follow up visit Goal status: INITIAL  6. Patient will demonstrate improved tandem stance time to at least 20 seconds BIL   Baseline: see objective measures   Goal Status: INITIAL  7. Patient will demonstrate improved SLS time to at least 15 seconds BIL.   Baseline: see objective measures  Goal Status: INITIAL     PLAN:  PT FREQUENCY: 1x/week  PT DURATION: 4 weeks  PLANNED INTERVENTIONS: 97164- PT Re-evaluation, 97110-Therapeutic exercises, 97530-  Therapeutic activity, 97112- Neuromuscular re-education, 97535- Self Care, 16109- Manual therapy, Patient/Family education, and Balance training  PLAN FOR NEXT SESSION: complete ABC scale; address static standing balance with narrow base of support, address SLS balance, education for decreased fall risk, create and provide initial HEP (including tandem stance, standing marches)   Berta Minor PTA  08/25/2023 12:53 PM

## 2023-08-27 ENCOUNTER — Telehealth: Payer: Self-pay

## 2023-08-27 ENCOUNTER — Ambulatory Visit

## 2023-08-27 NOTE — Telephone Encounter (Signed)
 Spoke with patient regarding missed appt. Was able to reschedule for tomorrow. - MJ

## 2023-08-28 ENCOUNTER — Ambulatory Visit

## 2023-08-28 DIAGNOSIS — R2689 Other abnormalities of gait and mobility: Secondary | ICD-10-CM

## 2023-08-28 DIAGNOSIS — R531 Weakness: Secondary | ICD-10-CM

## 2023-08-28 NOTE — Therapy (Signed)
 OUTPATIENT PHYSICAL THERAPY TREATMENT NOTE   Patient Name: Brittany Figueroa MRN: [id number] DOB:03-Jul-1940, 83 y.o., female Today's Date: 08/28/2023  END OF SESSION:  PT End of Session - 08/28/23 1022     Visit Number 4    Number of Visits 9    Date for PT Re-Evaluation 09/10/23    Authorization Type [employer] MCR    PT Start Time 1030    PT Stop Time 1108    PT Time Calculation (min) 38 min    Activity Tolerance Patient tolerated treatment well    Behavior During Therapy WFL for tasks assessed/performed             Past Medical History:  Diagnosis Date   Abnormal finding on EKG, new anterolateral T-wave inversions  05/14/2012   Abnormal nuclear stress test, with infero-lateral ischemia 05/14/2012   Arthritis    "right thumb; all my fingers" (05/13/2012), cervical spondylosis    Atypical angina (HCC) 05/14/2012   CAD (coronary artery disease), 05/13/12, with 80% LAD 05/14/2012   Cancer (HCC)    basal cell on facial in L temporal region     [facility] arthritis, thumb, degenerative    Complication of anesthesia 12/2009   "had endoscopy; larynx went into spasms; stopped breathing for 15 seconds" (05/13/2012)   GERD (gastroesophageal reflux disease) 2011   Hypercholesteremia    Hypertension    S/P angioplasty with stent, LAD 05/13/12 05/14/2012   Tuberculosis    test +- [place] [facility] - Dr. Ronne Binning- CXR- OK   Past Surgical History:  Procedure Laterality Date   ANTERIOR CERVICAL DECOMP/DISCECTOMY FUSION N/A 12/13/2013   Procedure: ANTERIOR CERVICAL DECOMPRESSION/DISCECTOMY FUSION 3 LEVELS Cervical four/five,five/six,six/seven. Anterior cervical disectomy and fusion with peek and plate ;  Surgeon: Temple Pacini, MD;  Location: [facility] NEURO ORS;  Service: Neurosurgery;  Laterality: N/A;   CARDIAC CATHETERIZATION N/A 07/02/2015   Procedure: Left Heart Cath and Coronary Angiography;  Surgeon: Runell Gess, MD;  Location: [facility] INVASIVE CV LAB;  Service: Cardiovascular;  Laterality: N/A;   CORONARY  ANGIOPLASTY WITH STENT PLACEMENT  05/13/2012   DES to LAD; she has total RCA with left to rt coll. normal LV function done for positive nuc study   DILATION AND CURETTAGE OF UTERUS  1960's; 1970's; 1980   "probably 3" (05/13/2012)   FOREARM / WRIST TENDON LESION EXCISION  ?11/2001   "right; did waver to try to get ulnar out of hole that it had cut" (05/13/2012   INGUINAL HERNIA REPAIR  ~ 1966; 07/21/2002   "right; left" (05/13/2012)   LEFT HEART CATHETERIZATION WITH CORONARY ANGIOGRAM N/A 05/13/2012   Procedure: LEFT HEART CATHETERIZATION WITH CORONARY ANGIOGRAM;  Surgeon: Runell Gess, MD;  Location: [facility] CATH LAB;  Service: Cardiovascular;  Laterality: N/A;   OSTEOTOMY AND ULNAR SHORTENING  07/21/2002   "right" (05/13/2012)   TONSILLECTOMY AND ADENOIDECTOMY  1950's   TUBAL LIGATION  1980   Patient Active Problem List   Diagnosis Date Noted   Abdominal pain 07/29/2023   Right bundle branch block 11/19/2022   Osteopenia after menopause 10/11/2021   Nuclear sclerotic cataract of both eyes 07/09/2021   Vitreous floaters of right eye 08/13/2020   Exudative age-related macular degeneration of right eye with active choroidal neovascularization (HCC) 07/04/2020   Intermediate stage nonexudative age-related macular degeneration of both eyes 07/04/2020   Hyponatremia 06/27/2017   Prediabetes 05/13/2015   Spondylosis, cervical, with myelopathy 12/13/2013   Cervical spondylosis with myelopathy 12/13/2013   Colon cancer screening 05/25/2013  Dyspnea 12/13/2012   Yeast infection of the skin: under both breasts 12/13/2012   Abnormal finding on EKG, new anterolateral T-wave inversions  05/14/2012   Abnormal nuclear stress test, with infero-lateral ischemia 05/14/2012   Atypical angina (HCC) 05/14/2012   CAD- residual total RCA and Dx disease 05/14/2012   S/P angioplasty with stent, LAD 05/13/12 05/14/2012   Dyslipidemia 05/14/2012   Essential hypertension 05/14/2012    PCP: Emilio Aspen, MD  REFERRING PROVIDER: Lonia Blood, MD  REFERRING DIAG: R10.32 (ICD-10-CM) - LLQ abdominal pain R53.1 (ICD-10-CM) - Weakness  THERAPY DIAG:  Weakness  Other abnormalities of gait and mobility  Rationale for Evaluation and Treatment: Rehabilitation  ONSET DATE: 07/30/2023  SUBJECTIVE:   SUBJECTIVE STATEMENT: Patient reports that she saw her ortho MD this week who stated that if therapy continues to bother her hip pain to stop doing PT. She states she wants to continue to try.   EVAL: Patient reports to PT 2 weeks s/p hospital admission for LLQ abdominal pain. She states that she was told she needed PT to address her balance. Patient states "I think my balance is good, but it's just that I was attached to that IV."    PERTINENT HISTORY: Relevant PMHx includes atypical angina , CAD, HTN, ACDF, osteopenia, hyponatremia, cervical spondylosis with myelopathy, coronary angioplasty with stent placement.   PAIN:  Are you having pain?  No  PRECAUTIONS: None  RED FLAGS: None   WEIGHT BEARING RESTRICTIONS: No  FALLS:  Has patient fallen in last 6 months? Yes. Number of falls 1, slipped on standing water in bathroom at a restaurant   LIVING ENVIRONMENT: Lives with: lives alone Lives in: House/apartment Stairs: No Has following equipment at home:  "I'm going to get some grab bars for my shower."   OCCUPATION: retired  PLOF: Independent and Leisure: walking and socialize with some friends in [place]   PATIENT GOALS: "I'll work on whatever you say I need to work on."   NEXT MD VISIT: 11/05/2023 with Endo  OBJECTIVE:  Note: Objective measures were completed at Evaluation unless otherwise noted.  DIAGNOSTIC FINDINGS:   07/29/2023 CT abdomen pelvis w/contrast   IMPRESSION: No bowel obstruction, free air or free fluid. Slight wall thickening along the sigmoid colon which could relate to level of distention. Please correlate for any particular symptoms of  a colitis.   Normal appendix.   Moderate hiatal hernia.   Prominent vascular calcifications along the aorta and branch vessels with areas of potential significant stenosis along the common iliac arteries. Please correlate with any symptoms.   PATIENT SURVEYS:  ABC scale to be provided at follow up visit  COGNITION: Overall cognitive status: Within functional limits for tasks assessed     LOWER EXTREMITY MMT:  MMT Right eval Left eval  Hip flexion 5 5  Hip extension    Hip abduction 5 5  Hip adduction 5 5  Hip internal rotation    Hip external rotation    Knee flexion 5 5  Knee extension 5 5  Ankle dorsiflexion 4+ 4+  Ankle plantarflexion    Ankle inversion    Ankle eversion     (Blank rows = not tested)   FUNCTIONAL TESTS:  5 times sit to stand: trial 1: 14 sec, 11 sec   MCTSIB:  Condition 1 - 30 sec  Condition 2 - 30 sec Condition 3 - 30 sec Condition 4 - 30 sec, mild sway   Tandem Stance time:   L:  12 sec (semi tandem)   R: 15 sec (semi tandem)  SLS time   L: 3 sec  R: 3 sec   GAIT: Distance walked: 30 feet from lobby to evaluation room  Assistive device utilized: None Level of assistance: Complete Independence Comments: slightly decreased stride length BIL                                                                                                                                 TREATMENT DATE:  [facility] Adult PT Treatment:                                                DATE: 08/28/23 Therapeutic Exercise: Nustep level 3 x 6 mins while gathering subjective info Seated marching 2x30" Seated hip adduction 5" hold 2x10 (therapist counting to maintain hold) LAQ 2.5# 2x10 BIL Seated hamstring curl GTB 2x10 BIL Heel/toe raises 2x10 Step ups to 4" step fwd/lat x10 ea BIL STS with ball OH press 2x10 Neuromuscular re-ed: Tandem stance x30" BIL (LOB with step strategy to recover on RLE) Sidestepping on Airex balance beam x2 laps (CGA) Tandem walking  on Airex balance beam fwd x2 laps (speeds up, heavy cues to slow down, CGA)   [facility] Adult PT Treatment:                                                DATE: 08/25/23 Therapeutic Exercise: Nustep level 3 x 6 mins while gathering subjective info Seated marching 2x30" Seated hip adduction 5" hold 2x10 Heel/toe raises 2x10 Step ups to aerobic step fwd/lat x10 ea BIL STS with Airex 2x10 (hands on knees) Neuromuscular re-ed: SLS on Airex cone taps (4) 2x30" BIL Tandem stance x30" BIL (LOB with step strategy to recover on RLE)  08/20/23  FGA assessment  Hurdles (3) x 3 laps forward Hurdles (3) x 3 laps sideways  Airex beam tandem walking x 3 laps  SLS on airex cone taps (4) x 30 sec each side  Recumbent bike at end of session, level 1 x 5 minutes for cool down  Created and reviewed HEP    [facility] Adult PT Treatment:                                                DATE: 08/13/2023   Initial evaluation: see patient education and home exercise program as noted below      PATIENT EDUCATION:  Education details: reviewed initial home exercise program; discussion of POC, prognosis and goals for skilled PT  Person educated: Patient Education method: Explanation, Demonstration, and Handouts Education comprehension: verbalized understanding, verbal cues required, and needs further education  HOME EXERCISE PROGRAM: Access Code: [email or web address] URL: [email or web address] Date: 08/25/2023 Prepared by: Berta Minor  Exercises - Standing Marching  - 1 x daily - 4 x weekly - 1 sets - 10 reps - Tandem Stance in Corner  - 1 x daily - 4 x weekly - 1 sets - 30 sec hold - Sit to Stand with Counter Support  - 1 x daily - 4 x weekly - 1 sets - 10 reps  ASSESSMENT:  CLINICAL IMPRESSION: Patient presents to PT reporting continued pain in her hips and that she saw her ortho MD who told her to terminate PT if it continues to make her hip pain worse. She is wanting to continue PT at this time.  Session today continued to focus on LE strengthening, balance, and slowly increasing standing activity tolerance. She is somewhat limited by pain in her hips and lower back, provided rest breaks as needed as to not increase her pain. She requires CGA with balance beam walking as she tends to increase her speed to complete task quick, does somewhat better with heavy cues. Patient continues to benefit from skilled PT services and should be progressed as able to improve functional independence.   EVAL: Adalai is a 83 y.o. female who was seen today for physical therapy evaluation and treatment 2 weeks s/p recent hospitalization for LLQ abdominal pain. She is demonstrating decreased ankle DF MMT scores BIL, decreased tandem stance and SLS time BIL. She requires skilled PT services at this time to address relevant deficits and to maximize safe and independent function.     OBJECTIVE IMPAIRMENTS: decreased balance.   ACTIVITY LIMITATIONS: dressing  PARTICIPATION LIMITATIONS: community activity  PERSONAL FACTORS: Age, Past/current experiences, and 3+ comorbidities: Relevant PMHx includes atypical angina , CAD, HTN, ACDF, osteopenia, hyponatremia, cervical spondylosis with myelopathy, coronary angioplasty with stent placement.   are also affecting patient's functional outcome.   REHAB POTENTIAL: Good  CLINICAL DECISION MAKING: Stable/uncomplicated  EVALUATION COMPLEXITY: Low   GOALS: Goals reviewed with patient? Yes  SHORT TERM GOALS = LONG TERM GOALS: Target date: 09/10/2023   Patient will report at least 80% confidence on Activity-Specific Balance Confidence scale in order to demonstrate low fall risk.  Baseline: to be assessed at first follow up  Goal status: INITIAL  2.  Patient will score at least 23/30 on Functional Gait Asessment.  Baseline: 20/30 on 08/20/23  Most limited with "gait with vertical head turns," "gait with narrow BOS," and ambulating backwards  Goal status: INITIAL  3.   Patient will verbalize 2-3 strategies for decreasing risk of falls.  Baseline: to be discussed at follow up visits Goal status: INITIAL  4.  Patient will demonstrate 5/5 BIL ankle dorsiflexion strength.  Baseline: 4+/5 BIL Goal status: INITIAL  5.  Patient will be independent with initial home program for [place] strengthening, and balance with narrow BOS.  Baseline: to be provided at follow up visit Goal status: INITIAL  6. Patient will demonstrate improved tandem stance time to at least 20 seconds BIL   Baseline: see objective measures   Goal Status: INITIAL  7. Patient will demonstrate improved SLS time to at least 15 seconds BIL.   Baseline: see objective measures  Goal Status: INITIAL     PLAN:  PT FREQUENCY: 1x/week  PT DURATION: 4 weeks  PLANNED INTERVENTIONS: [id number]- PT Re-evaluation, 97110-Therapeutic exercises, 97530- Therapeutic activity, 97112-  Neuromuscular re-education, [phone]- Self Care, 29528- Manual therapy, Patient/Family education, and Balance training  PLAN FOR NEXT SESSION: complete ABC scale; address static standing balance with narrow base of support, address SLS balance, education for decreased fall risk, create and provide initial HEP (including tandem stance, standing marches)   Berta Minor PTA  08/28/2023 11:09 AM

## 2023-08-29 DIAGNOSIS — H353122 Nonexudative age-related macular degeneration, left eye, intermediate dry stage: Secondary | ICD-10-CM | POA: Diagnosis not present

## 2023-09-01 ENCOUNTER — Ambulatory Visit

## 2023-09-01 DIAGNOSIS — R2689 Other abnormalities of gait and mobility: Secondary | ICD-10-CM

## 2023-09-01 DIAGNOSIS — R531 Weakness: Secondary | ICD-10-CM | POA: Diagnosis not present

## 2023-09-01 NOTE — Therapy (Signed)
 OUTPATIENT PHYSICAL THERAPY TREATMENT NOTE   Patient Name: Brittany Figueroa MRN: [id number] DOB:1941-05-15, 83 y.o., female Today's Date: 09/01/2023  END OF SESSION:  PT End of Session - 09/01/23 1007     Visit Number 5    Number of Visits 9    Date for PT Re-Evaluation 09/10/23    Authorization Type [employer] MCR    PT Start Time 1005    PT Stop Time 1043    PT Time Calculation (min) 38 min    Activity Tolerance Patient tolerated treatment well    Behavior During Therapy WFL for tasks assessed/performed              Past Medical History:  Diagnosis Date   Abnormal finding on EKG, new anterolateral T-wave inversions  05/14/2012   Abnormal nuclear stress test, with infero-lateral ischemia 05/14/2012   Arthritis    "right thumb; all my fingers" (05/13/2012), cervical spondylosis    Atypical angina (HCC) 05/14/2012   CAD (coronary artery disease), 05/13/12, with 80% LAD 05/14/2012   Cancer (HCC)    basal cell on facial in L temporal region     [facility] arthritis, thumb, degenerative    Complication of anesthesia 12/2009   "had endoscopy; larynx went into spasms; stopped breathing for 15 seconds" (05/13/2012)   GERD (gastroesophageal reflux disease) 2011   Hypercholesteremia    Hypertension    S/P angioplasty with stent, LAD 05/13/12 05/14/2012   Tuberculosis    test +- [place] [facility] - Dr. Ronne Binning- CXR- OK   Past Surgical History:  Procedure Laterality Date   ANTERIOR CERVICAL DECOMP/DISCECTOMY FUSION N/A 12/13/2013   Procedure: ANTERIOR CERVICAL DECOMPRESSION/DISCECTOMY FUSION 3 LEVELS Cervical four/five,five/six,six/seven. Anterior cervical disectomy and fusion with peek and plate ;  Surgeon: Temple Pacini, MD;  Location: [facility] NEURO ORS;  Service: Neurosurgery;  Laterality: N/A;   CARDIAC CATHETERIZATION N/A 07/02/2015   Procedure: Left Heart Cath and Coronary Angiography;  Surgeon: Runell Gess, MD;  Location: [facility] INVASIVE CV LAB;  Service: Cardiovascular;  Laterality: N/A;   CORONARY  ANGIOPLASTY WITH STENT PLACEMENT  05/13/2012   DES to LAD; she has total RCA with left to rt coll. normal LV function done for positive nuc study   DILATION AND CURETTAGE OF UTERUS  1960's; 1970's; 1980   "probably 3" (05/13/2012)   FOREARM / WRIST TENDON LESION EXCISION  ?11/2001   "right; did waver to try to get ulnar out of hole that it had cut" (05/13/2012   INGUINAL HERNIA REPAIR  ~ 1966; 07/21/2002   "right; left" (05/13/2012)   LEFT HEART CATHETERIZATION WITH CORONARY ANGIOGRAM N/A 05/13/2012   Procedure: LEFT HEART CATHETERIZATION WITH CORONARY ANGIOGRAM;  Surgeon: Runell Gess, MD;  Location: [facility] CATH LAB;  Service: Cardiovascular;  Laterality: N/A;   OSTEOTOMY AND ULNAR SHORTENING  07/21/2002   "right" (05/13/2012)   TONSILLECTOMY AND ADENOIDECTOMY  1950's   TUBAL LIGATION  1980   Patient Active Problem List   Diagnosis Date Noted   Abdominal pain 07/29/2023   Right bundle branch block 11/19/2022   Osteopenia after menopause 10/11/2021   Nuclear sclerotic cataract of both eyes 07/09/2021   Vitreous floaters of right eye 08/13/2020   Exudative age-related macular degeneration of right eye with active choroidal neovascularization (HCC) 07/04/2020   Intermediate stage nonexudative age-related macular degeneration of both eyes 07/04/2020   Hyponatremia 06/27/2017   Prediabetes 05/13/2015   Spondylosis, cervical, with myelopathy 12/13/2013   Cervical spondylosis with myelopathy 12/13/2013   Colon cancer screening 05/25/2013  Dyspnea 12/13/2012   Yeast infection of the skin: under both breasts 12/13/2012   Abnormal finding on EKG, new anterolateral T-wave inversions  05/14/2012   Abnormal nuclear stress test, with infero-lateral ischemia 05/14/2012   Atypical angina (HCC) 05/14/2012   CAD- residual total RCA and Dx disease 05/14/2012   S/P angioplasty with stent, LAD 05/13/12 05/14/2012   Dyslipidemia 05/14/2012   Essential hypertension 05/14/2012    PCP: Emilio Aspen, MD  REFERRING PROVIDER: Lonia Blood, MD  REFERRING DIAG: R10.32 (ICD-10-CM) - LLQ abdominal pain R53.1 (ICD-10-CM) - Weakness  THERAPY DIAG:  Weakness  Other abnormalities of gait and mobility  Rationale for Evaluation and Treatment: Rehabilitation  ONSET DATE: 07/30/2023  SUBJECTIVE:   SUBJECTIVE STATEMENT: Patient states that she has worked on her home exercises twice since last visit. She feels that her hip is weak.   EVAL: Patient reports to PT 2 weeks s/p hospital admission for LLQ abdominal pain. She states that she was told she needed PT to address her balance. Patient states "I think my balance is good, but it's just that I was attached to that IV."    PERTINENT HISTORY: Relevant PMHx includes atypical angina , CAD, HTN, ACDF, osteopenia, hyponatremia, cervical spondylosis with myelopathy, coronary angioplasty with stent placement.   PAIN:  Are you having pain?  No  PRECAUTIONS: None  RED FLAGS: None   WEIGHT BEARING RESTRICTIONS: No  FALLS:  Has patient fallen in last 6 months? Yes. Number of falls 1, slipped on standing water in bathroom at a restaurant   LIVING ENVIRONMENT: Lives with: lives alone Lives in: House/apartment Stairs: No Has following equipment at home:  "I'm going to get some grab bars for my shower."   OCCUPATION: retired  PLOF: Independent and Leisure: walking and socialize with some friends in [place]   PATIENT GOALS: "I'll work on whatever you say I need to work on."   NEXT MD VISIT: 11/05/2023 with Endo  OBJECTIVE:  Note: Objective measures were completed at Evaluation unless otherwise noted.  DIAGNOSTIC FINDINGS:   07/29/2023 CT abdomen pelvis w/contrast   IMPRESSION: No bowel obstruction, free air or free fluid. Slight wall thickening along the sigmoid colon which could relate to level of distention. Please correlate for any particular symptoms of a colitis.   Normal appendix.   Moderate hiatal  hernia.   Prominent vascular calcifications along the aorta and branch vessels with areas of potential significant stenosis along the common iliac arteries. Please correlate with any symptoms.   PATIENT SURVEYS:  ABC scale to be provided at follow up visit  COGNITION: Overall cognitive status: Within functional limits for tasks assessed     LOWER EXTREMITY MMT:  MMT Right eval Left eval  Hip flexion 5 5  Hip extension    Hip abduction 5 5  Hip adduction 5 5  Hip internal rotation    Hip external rotation    Knee flexion 5 5  Knee extension 5 5  Ankle dorsiflexion 4+ 4+  Ankle plantarflexion    Ankle inversion    Ankle eversion     (Blank rows = not tested)   FUNCTIONAL TESTS:  5 times sit to stand: trial 1: 14 sec, 11 sec   MCTSIB:  Condition 1 - 30 sec  Condition 2 - 30 sec Condition 3 - 30 sec Condition 4 - 30 sec, mild sway   Tandem Stance time:   L: 12 sec (semi tandem)   R: 15 sec (semi tandem)  SLS time   L: 3 sec  R: 3 sec   GAIT: Distance walked: 30 feet from lobby to evaluation room  Assistive device utilized: None Level of assistance: Complete Independence Comments: slightly decreased stride length BIL                                                                                                                                 TREATMENT DATE:     [facility] Adult PT Treatment:                                                DATE: 09/01/23 Therapeutic Exercise: Nustep level 5 x 5 mins at end of session. Seated marching 2x30" Seated hip adduction 5" hold 2x10 (therapist counting to maintain hold) LAQ 2.5# 2x10 BIL Seated hamstring curl GTB 2x10 BIL Seated Heel/toe raises 2x10 STS with small ball 100g OH press 2x10   Neuromuscular re-ed: Tandem stance x30" BIL (LOB with step strategy to recover on RLE) Sidestepping on Airex balance beam x2 laps (CGA) Tandem walking on Airex balance beam fwd x2 laps (speeds up, heavy cues to slow down,  CGA)     [facility] Adult PT Treatment:                                                DATE: 08/13/2023   Initial evaluation: see patient education and home exercise program as noted below      PATIENT EDUCATION:  Education details: reviewed initial home exercise program; discussion of POC, prognosis and goals for skilled PT   Person educated: Patient Education method: Explanation, Demonstration, and Handouts Education comprehension: verbalized understanding, verbal cues required, and needs further education  HOME EXERCISE PROGRAM: Access Code: [email or web address] URL: [email or web address] Date: 08/25/2023 Prepared by: Berta Minor  Exercises - Standing Marching  - 1 x daily - 4 x weekly - 1 sets - 10 reps - Tandem Stance in Corner  - 1 x daily - 4 x weekly - 1 sets - 30 sec hold - Sit to Stand with Counter Support  - 1 x daily - 4 x weekly - 1 sets - 10 reps  ASSESSMENT:  CLINICAL IMPRESSION: Patient has improved exercises tolerance today. She continues to require frequent verbal and visual cues for correct performance of exercises. We will continue to progress exercises to focus on LE strengthening, balance, and slowly increasing standing activity tolerance.   EVAL: Ngoc is a 83 y.o. female who was seen today for physical therapy evaluation and treatment 2 weeks s/p recent hospitalization for LLQ abdominal pain. She is demonstrating decreased ankle DF MMT scores BIL, decreased tandem stance and SLS time BIL. She requires  skilled PT services at this time to address relevant deficits and to maximize safe and independent function.     OBJECTIVE IMPAIRMENTS: decreased balance.   ACTIVITY LIMITATIONS: dressing  PARTICIPATION LIMITATIONS: community activity  PERSONAL FACTORS: Age, Past/current experiences, and 3+ comorbidities: Relevant PMHx includes atypical angina , CAD, HTN, ACDF, osteopenia, hyponatremia, cervical spondylosis with myelopathy, coronary angioplasty with  stent placement.   are also affecting patient's functional outcome.   REHAB POTENTIAL: Good  CLINICAL DECISION MAKING: Stable/uncomplicated  EVALUATION COMPLEXITY: Low   GOALS: Goals reviewed with patient? Yes  SHORT TERM GOALS = LONG TERM GOALS: Target date: 09/10/2023   Patient will report at least 80% confidence on Activity-Specific Balance Confidence scale in order to demonstrate low fall risk.  Baseline: to be assessed at first follow up  Goal status: INITIAL  2.  Patient will score at least 23/30 on Functional Gait Asessment.  Baseline: 20/30 on 08/20/23  Most limited with "gait with vertical head turns," "gait with narrow BOS," and ambulating backwards  Goal status: INITIAL  3.  Patient will verbalize 2-3 strategies for decreasing risk of falls.  Baseline: to be discussed at follow up visits Goal status: INITIAL  4.  Patient will demonstrate 5/5 BIL ankle dorsiflexion strength.  Baseline: 4+/5 BIL Goal status: INITIAL  5.  Patient will be independent with initial home program for [place] strengthening, and balance with narrow BOS.  Baseline: to be provided at follow up visit Goal status: INITIAL  6. Patient will demonstrate improved tandem stance time to at least 20 seconds BIL   Baseline: see objective measures   Goal Status: INITIAL  7. Patient will demonstrate improved SLS time to at least 15 seconds BIL.   Baseline: see objective measures  Goal Status: INITIAL     PLAN:  PT FREQUENCY: 1x/week  PT DURATION: 4 weeks  PLANNED INTERVENTIONS: [id number]- PT Re-evaluation, 97110-Therapeutic exercises, 97530- Therapeutic activity, 97112- Neuromuscular re-education, 97535- Self Care, 16109- Manual therapy, Patient/Family education, and Balance training  PLAN FOR NEXT SESSION: complete ABC scale; address static standing balance with narrow base of support, address SLS balance, education for decreased fall risk, create and provide initial HEP (including tandem  stance, standing marches)   Arlester Bence, PT, DPT  09/01/2023 10:59 PM

## 2023-09-03 ENCOUNTER — Ambulatory Visit

## 2023-09-08 DIAGNOSIS — I2510 Atherosclerotic heart disease of native coronary artery without angina pectoris: Secondary | ICD-10-CM | POA: Diagnosis not present

## 2023-09-08 DIAGNOSIS — K219 Gastro-esophageal reflux disease without esophagitis: Secondary | ICD-10-CM | POA: Diagnosis not present

## 2023-09-08 DIAGNOSIS — R739 Hyperglycemia, unspecified: Secondary | ICD-10-CM | POA: Diagnosis not present

## 2023-09-08 DIAGNOSIS — Z9181 History of falling: Secondary | ICD-10-CM | POA: Diagnosis not present

## 2023-09-08 DIAGNOSIS — I25119 Atherosclerotic heart disease of native coronary artery with unspecified angina pectoris: Secondary | ICD-10-CM | POA: Diagnosis not present

## 2023-09-08 DIAGNOSIS — E1159 Type 2 diabetes mellitus with other circulatory complications: Secondary | ICD-10-CM | POA: Diagnosis not present

## 2023-09-08 DIAGNOSIS — E871 Hypo-osmolality and hyponatremia: Secondary | ICD-10-CM | POA: Diagnosis not present

## 2023-09-08 DIAGNOSIS — I10 Essential (primary) hypertension: Secondary | ICD-10-CM | POA: Diagnosis not present

## 2023-09-08 LAB — LAB REPORT - SCANNED
A1c: 6.4
EGFR: 92

## 2023-09-09 ENCOUNTER — Encounter: Payer: Self-pay | Admitting: Physical Therapy

## 2023-09-09 ENCOUNTER — Ambulatory Visit: Admitting: Physical Therapy

## 2023-09-09 DIAGNOSIS — R531 Weakness: Secondary | ICD-10-CM | POA: Diagnosis not present

## 2023-09-09 DIAGNOSIS — R2689 Other abnormalities of gait and mobility: Secondary | ICD-10-CM | POA: Diagnosis not present

## 2023-09-09 NOTE — Therapy (Signed)
 OUTPATIENT PHYSICAL THERAPY TREATMENT NOTE + DISCHARGE SUMMARY   Patient Name: Brittany Figueroa MRN: [id number] DOB:[birth year], 83 y.o., female Today's Date: 09/09/2023   PHYSICAL THERAPY DISCHARGE SUMMARY  Visits from Start of Care: 6  Current functional level related to goals / functional outcomes: Denies limitations in ADLs/mobility   Remaining deficits: Mild postural instability, L ankle weakness   Education / Equipment: HEP, discharge education, follow up with provider, fall risk reduction   Patient agrees to discharge. Patient goals were partially met. Patient is being discharged due to the patient's request.   END OF SESSION:  PT End of Session - 09/09/23 1126     Visit Number 6    Number of Visits 9    Date for PT Re-Evaluation 09/10/23    Authorization Type [employer] MCR    Progress Note Due on Visit 10    PT Start Time 1129    PT Stop Time 1211    PT Time Calculation (min) 42 min    Activity Tolerance Patient tolerated treatment well               Past Medical History:  Diagnosis Date   Abnormal finding on EKG, new anterolateral T-wave inversions  05/14/2012   Abnormal nuclear stress test, with infero-lateral ischemia 05/14/2012   Arthritis    "right thumb; all my fingers" (05/13/2012), cervical spondylosis    Atypical angina (HCC) 05/14/2012   CAD (coronary artery disease), 05/13/12, with 80% LAD 05/14/2012   Cancer (HCC)    basal cell on facial in L temporal region     [facility] arthritis, thumb, degenerative    Complication of anesthesia 12/2009   "had endoscopy; larynx went into spasms; stopped breathing for 15 seconds" (05/13/2012)   GERD (gastroesophageal reflux disease) 2011   Hypercholesteremia    Hypertension    S/P angioplasty with stent, LAD 05/13/12 05/14/2012   Tuberculosis    test +- [place] [facility] - Dr. Claretta Croft- CXR- OK   Past Surgical History:  Procedure Laterality Date   ANTERIOR CERVICAL DECOMP/DISCECTOMY FUSION N/A 12/13/2013   Procedure:  ANTERIOR CERVICAL DECOMPRESSION/DISCECTOMY FUSION 3 LEVELS Cervical four/five,five/six,six/seven. Anterior cervical disectomy and fusion with peek and plate ;  Surgeon: Baruch Bosch, MD;  Location: [facility] NEURO ORS;  Service: Neurosurgery;  Laterality: N/A;   CARDIAC CATHETERIZATION N/A 07/02/2015   Procedure: Left Heart Cath and Coronary Angiography;  Surgeon: Avanell Leigh, MD;  Location: [facility] INVASIVE CV LAB;  Service: Cardiovascular;  Laterality: N/A;   CORONARY ANGIOPLASTY WITH STENT PLACEMENT  05/13/2012   DES to LAD; she has total RCA with left to rt coll. normal LV function done for positive nuc study   DILATION AND CURETTAGE OF UTERUS  1960's; 1970's; 1980   "probably 3" (05/13/2012)   FOREARM / WRIST TENDON LESION EXCISION  ?11/2001   "right; did waver to try to get ulnar out of hole that it had cut" (05/13/2012   INGUINAL HERNIA REPAIR  ~ 1966; 07/21/2002   "right; left" (05/13/2012)   LEFT HEART CATHETERIZATION WITH CORONARY ANGIOGRAM N/A 05/13/2012   Procedure: LEFT HEART CATHETERIZATION WITH CORONARY ANGIOGRAM;  Surgeon: Avanell Leigh, MD;  Location: [facility] CATH LAB;  Service: Cardiovascular;  Laterality: N/A;   OSTEOTOMY AND ULNAR SHORTENING  07/21/2002   "right" (05/13/2012)   TONSILLECTOMY AND ADENOIDECTOMY  1950's   TUBAL LIGATION  1980   Patient Active Problem List   Diagnosis Date Noted   Abdominal pain 07/29/2023   Right bundle branch block 11/19/2022  Osteopenia after menopause 10/11/2021   Nuclear sclerotic cataract of both eyes 07/09/2021   Vitreous floaters of right eye 08/13/2020   Exudative age-related macular degeneration of right eye with active choroidal neovascularization (HCC) 07/04/2020   Intermediate stage nonexudative age-related macular degeneration of both eyes 07/04/2020   Hyponatremia 06/27/2017   Prediabetes 05/13/2015   Spondylosis, cervical, with myelopathy 12/13/2013   Cervical spondylosis with myelopathy 12/13/2013   Colon cancer screening 05/25/2013    Dyspnea 12/13/2012   Yeast infection of the skin: under both breasts 12/13/2012   Abnormal finding on EKG, new anterolateral T-wave inversions  05/14/2012   Abnormal nuclear stress test, with infero-lateral ischemia 05/14/2012   Atypical angina (HCC) 05/14/2012   CAD- residual total RCA and Dx disease 05/14/2012   S/P angioplasty with stent, LAD 05/13/12 05/14/2012   Dyslipidemia 05/14/2012   Essential hypertension 05/14/2012    PCP: Benedetta Bradley, MD  REFERRING PROVIDER: Abbe Abate, MD  REFERRING DIAG: R10.32 (ICD-10-CM) - LLQ abdominal pain R53.1 (ICD-10-CM) - Weakness  THERAPY DIAG:  Weakness  Other abnormalities of gait and mobility  Rationale for Evaluation and Treatment: Rehabilitation  ONSET DATE: 07/30/2023  SUBJECTIVE:   SUBJECTIVE STATEMENT: 09/09/2023 Pt states she tripped going up steps on Friday and sustained a fall. Fairly significant bruising about R UE, R chest, and forehead with some lacerations on the arm. She states she saw PCP yesterday and no concerns, just generally sore. Pt states she would like to discharge today - despite fall she denies any concerns with her balance and does not feel limited. States she got an injection which helped her hip, and feels like sometimes the exercises bother her hip.   EVAL: Patient reports to PT 2 weeks s/p hospital admission for LLQ abdominal pain. She states that she was told she needed PT to address her balance. Patient states "I think my balance is good, but it's just that I was attached to that IV."    PERTINENT HISTORY: Relevant PMHx includes atypical angina , CAD, HTN, ACDF, osteopenia, hyponatremia, cervical spondylosis with myelopathy, coronary angioplasty with stent placement.   PAIN:  Are you having pain?  No  PRECAUTIONS: None  RED FLAGS: None   WEIGHT BEARING RESTRICTIONS: No  FALLS:  Has patient fallen in last 6 months? Yes. Number of falls 1, slipped on standing water  in bathroom at  a restaurant   LIVING ENVIRONMENT: Lives with: lives alone Lives in: House/apartment Stairs: No Has following equipment at home:  "I'm going to get some grab bars for my shower."   OCCUPATION: retired  PLOF: Independent and Leisure: walking and socialize with some friends in [place]   PATIENT GOALS: "I'll work on whatever you say I need to work on."   NEXT MD VISIT: 11/05/2023 with Endo  OBJECTIVE:  Note: Objective measures were completed at Evaluation unless otherwise noted.  DIAGNOSTIC FINDINGS:   07/29/2023 CT abdomen pelvis w/contrast   IMPRESSION: No bowel obstruction, free air or free fluid. Slight wall thickening along the sigmoid colon which could relate to level of distention. Please correlate for any particular symptoms of a colitis.   Normal appendix.   Moderate hiatal hernia.   Prominent vascular calcifications along the aorta and branch vessels with areas of potential significant stenosis along the common iliac arteries. Please correlate with any symptoms.   PATIENT SURVEYS:  ABC scale to be provided at follow up visit ABC 09/09/23 90%  COGNITION: Overall cognitive status: Within functional limits for tasks assessed  LOWER EXTREMITY MMT:  MMT Right eval Left eval R/L 09/09/23  Hip flexion 5 5   Hip extension     Hip abduction 5 5   Hip adduction 5 5   Hip internal rotation     Hip external rotation     Knee flexion 5 5   Knee extension 5 5   Ankle dorsiflexion 4+ 4+ 5/4+  Ankle plantarflexion     Ankle inversion     Ankle eversion      (Blank rows = not tested)   FUNCTIONAL TESTS:  5 times sit to stand: trial 1: 14 sec, 11 sec   MCTSIB:  Condition 1 - 30 sec  Condition 2 - 30 sec Condition 3 - 30 sec Condition 4 - 30 sec, mild sway   Tandem Stance time:   L: 12 sec (semi tandem)   R: 15 sec (semi tandem)  SLS time   L: 3 sec  R: 3 sec    09/09/23:  L tandem 30sec mild sway R tandem 30 sec   SLS R 7 sec, L 4  sec  Stair assessment: reciprocal pattern ascending descending w/ unilat rail, good stability, no LOB  FGA:  -Item 1 Gait Level Surface: Normal 3  -Item 2 Change in Gait Speed: Normal 3  -Item 3 Gait with Horizontal Head Turns: mild impairment 2  -Item 4 Gait with Vertical Head Turns: Normal 3  -Item 5 Gait with Pivot Turn: Normal 3  -Item 6 Step Over Obstacle: Normal 3  -Item 7 Gait with Narrow Base of Support: moderate impairment 1   -Item 8 Gait with Eyes Closed: mild impairment 2             -Item 9 Ambulating Backwards: mild impairment 2  -Item 10 Steps: mild impairment 2  Total: 24 /30  * Score of <=22/30 indicates that patient is at increased risk for falls.   GAIT: Distance walked: 30 feet from lobby to evaluation room  Assistive device utilized: None Level of assistance: Complete Independence Comments: slightly decreased stride length BIL                                                                                                                                 TREATMENT DATE:   [facility] Adult PT Treatment:                                                DATE: 09/09/23  Therapeutic Activity: MSK assessment + education FGA + education Balance assessment (SLS, tandem) + education Stair assessment + education Education/discussion re: progress with PT, symptom behavior as it affects activity tolerance, PT goals/POC, discharge education, follow up with provider, fall risk reduction strategies   PATIENT EDUCATION:  Education details: PT POC, PT goals, progress with PT thus  far, discharge planning, HEP, follow up with provider, fall risk reduction Person educated: Patient Education method: Explanation, Demonstration, Verbal cues Education comprehension: verbalized understanding, returned demonstration   HOME EXERCISE PROGRAM: Access Code: [email or web address] URL: [email or web address] Date: 08/25/2023 Prepared by: Bhavika Kettering  Exercises - Standing Marching   - 1 x daily - 4 x weekly - 1 sets - 10 reps - Tandem Stance in Corner  - 1 x daily - 4 x weekly - 1 sets - 30 sec hold - Sit to Stand with Counter Support  - 1 x daily - 4 x weekly - 1 sets - 10 reps  ASSESSMENT:  CLINICAL IMPRESSION: 09/09/2023 Pt arrives after a fall this weekend - reports generalized soreness, states she followed up with PCP yesterday and no concerns. She states she would like to discharge today. She demonstrates improvements in FGA score and tandem stance, limited progress with SLS. She verbalizes good awareness of fall risk reduction strategies despite recent fall, and we spend time w/ education on this topic. She denies any questions or concerns, states she feels confident with HEP. Tolerates exam well. Encouraged her if she would like to return to PT to address higher level balance, to follow up with provider for new referral. Pt departs today's session in no acute distress, all voiced questions/concerns addressed appropriately from PT perspective.    EVAL: Brittany Figueroa is a 83 y.o. female who was seen today for physical therapy evaluation and treatment 2 weeks s/p recent hospitalization for LLQ abdominal pain. She is demonstrating decreased ankle DF MMT scores BIL, decreased tandem stance and SLS time BIL. She requires skilled PT services at this time to address relevant deficits and to maximize safe and independent function.     OBJECTIVE IMPAIRMENTS: decreased balance.   ACTIVITY LIMITATIONS: dressing  PARTICIPATION LIMITATIONS: community activity  PERSONAL FACTORS: Age, Past/current experiences, and 3+ comorbidities: Relevant PMHx includes atypical angina , CAD, HTN, ACDF, osteopenia, hyponatremia, cervical spondylosis with myelopathy, coronary angioplasty with stent placement.   are also affecting patient's functional outcome.   REHAB POTENTIAL: Good  CLINICAL DECISION MAKING: Stable/uncomplicated  EVALUATION COMPLEXITY: Low   GOALS: Goals reviewed with patient?  Yes  SHORT TERM GOALS = LONG TERM GOALS: Target date: 09/10/2023   Patient will report at least 80% confidence on Activity-Specific Balance Confidence scale in order to demonstrate low fall risk.  Baseline: to be assessed at first follow up  09/09/23: 90% Goal Status: MET  2.  Patient will score at least 23/30 on Functional Gait Asessment.  Baseline: 20/30 on 08/20/23  Most limited with "gait with vertical head turns," "gait with narrow BOS," and ambulating backwards  09/09/23: 24/30 Goal Status: MET  3.  Patient will verbalize 2-3 strategies for decreasing risk of falls.  Baseline: to be discussed at follow up visits 09/09/23: pt verbalizing understanding of strategies to reduce fall risk Goal Status: MET  4.  Patient will demonstrate 5/5 BIL ankle dorsiflexion strength.  Baseline: 4+/5 BIL 09/09/23: see MMT chart above Goal Status: PARTIALLY MET  5.  Patient will be independent with initial home program for [place] strengthening, and balance with narrow BOS.  Baseline: to be provided at follow up visit 09/09/23: reports good HEP adherence prior to fall, but did limit some due to perceived weakness in hips Goal status: PARTIALLY MET  6. Patient will demonstrate improved tandem stance time to at least 20 seconds BIL   Baseline: see objective measures   09/09/23: 30 sec BIL  Goal Status: MET  7. Patient will demonstrate improved SLS time to at least 15 seconds BIL.   Baseline: see objective measures  09/09/23: see above  Goal Status: PROGRESSED, NOT MET    PLAN: DISCHARGE 09/09/23   Lovett Ruck PT, DPT 09/09/2023 3:39 PM

## 2023-09-10 ENCOUNTER — Telehealth: Payer: Self-pay | Admitting: Cardiovascular Disease

## 2023-09-10 ENCOUNTER — Ambulatory Visit: Admitting: Physical Therapy

## 2023-09-10 NOTE — Telephone Encounter (Signed)
 Spoke to patient she stated her insurance changed.She cannot afford Vascepa .She wanted to ask Dr.Berry if ok to take over the counter fish oil.Advised I will send message to him for advice.

## 2023-09-10 NOTE — Telephone Encounter (Signed)
 Pt c/o medication issue:  1. Name of Medication:   icosapent  Ethyl (VASCEPA ) 1 g capsule    2. How are you currently taking this medication (dosage and times per day)? As written  3. Are you having a reaction (difficulty breathing--STAT)? No   4. What is your medication issue? Insurance has gone up on this medication. It is now $47.00 a month co-pay and it had been $0. Is there anything else she can take?? Per pt you can leave a detailed voicemail.

## 2023-09-11 DIAGNOSIS — H353122 Nonexudative age-related macular degeneration, left eye, intermediate dry stage: Secondary | ICD-10-CM | POA: Diagnosis not present

## 2023-09-11 DIAGNOSIS — B880 Other acariasis: Secondary | ICD-10-CM | POA: Diagnosis not present

## 2023-09-11 DIAGNOSIS — H01001 Unspecified blepharitis right upper eyelid: Secondary | ICD-10-CM | POA: Diagnosis not present

## 2023-09-11 DIAGNOSIS — H353212 Exudative age-related macular degeneration, right eye, with inactive choroidal neovascularization: Secondary | ICD-10-CM | POA: Diagnosis not present

## 2023-09-11 DIAGNOSIS — H01004 Unspecified blepharitis left upper eyelid: Secondary | ICD-10-CM | POA: Diagnosis not present

## 2023-09-14 NOTE — Telephone Encounter (Signed)
 Called patient left message on personal voice mail Dr.Berry's advice.Advised waiting on PharmD's advice.

## 2023-09-14 NOTE — Telephone Encounter (Signed)
 Patient is returning call. Please advise?

## 2023-09-14 NOTE — Telephone Encounter (Signed)
 Patient has hypercholesterolemia [employer] grant, good to 08/10/24  ID        [id number] BIN      [id number] PCN    [id number] GRP    [id number]  Can use for both Repatha  and Vascepa 

## 2023-09-14 NOTE — Telephone Encounter (Signed)
 LMTCB to discuss plan of care.

## 2023-09-14 NOTE — Telephone Encounter (Signed)
 Shared [employer] information with patient.  Also called [employer] to give [employer] info to them.  Patient voiced understanding.

## 2023-09-15 ENCOUNTER — Ambulatory Visit

## 2023-09-28 DIAGNOSIS — H353122 Nonexudative age-related macular degeneration, left eye, intermediate dry stage: Secondary | ICD-10-CM | POA: Diagnosis not present

## 2023-10-08 ENCOUNTER — Encounter: Payer: Self-pay | Admitting: Cardiovascular Disease

## 2023-10-13 DIAGNOSIS — H353211 Exudative age-related macular degeneration, right eye, with active choroidal neovascularization: Secondary | ICD-10-CM | POA: Diagnosis not present

## 2023-10-13 DIAGNOSIS — H6121 Impacted cerumen, right ear: Secondary | ICD-10-CM | POA: Diagnosis not present

## 2023-10-21 DIAGNOSIS — H43813 Vitreous degeneration, bilateral: Secondary | ICD-10-CM | POA: Diagnosis not present

## 2023-10-21 DIAGNOSIS — H43391 Other vitreous opacities, right eye: Secondary | ICD-10-CM | POA: Diagnosis not present

## 2023-10-26 DIAGNOSIS — M545 Low back pain, unspecified: Secondary | ICD-10-CM | POA: Diagnosis not present

## 2023-10-28 ENCOUNTER — Other Ambulatory Visit: Payer: Self-pay | Admitting: Cardiovascular Disease

## 2023-10-28 ENCOUNTER — Other Ambulatory Visit: Payer: Self-pay

## 2023-10-28 DIAGNOSIS — H353122 Nonexudative age-related macular degeneration, left eye, intermediate dry stage: Secondary | ICD-10-CM | POA: Diagnosis not present

## 2023-11-02 ENCOUNTER — Other Ambulatory Visit

## 2023-11-02 DIAGNOSIS — E559 Vitamin D deficiency, unspecified: Secondary | ICD-10-CM | POA: Diagnosis not present

## 2023-11-02 DIAGNOSIS — E039 Hypothyroidism, unspecified: Secondary | ICD-10-CM | POA: Diagnosis not present

## 2023-11-02 LAB — TSH: TSH: 2.02 m[IU]/L (ref 0.40–4.50)

## 2023-11-02 LAB — T4, FREE: Free T4: 1.6 ng/dL (ref 0.8–1.8)

## 2023-11-02 LAB — VITAMIN D 25 HYDROXY (VIT D DEFICIENCY, FRACTURES): Vit D, 25-Hydroxy: 35 ng/mL (ref 30–100)

## 2023-11-03 ENCOUNTER — Ambulatory Visit: Payer: Self-pay | Admitting: Endocrinology

## 2023-11-05 ENCOUNTER — Ambulatory Visit: Admitting: Endocrinology

## 2023-11-05 ENCOUNTER — Encounter: Payer: Self-pay | Admitting: Endocrinology

## 2023-11-05 ENCOUNTER — Telehealth: Payer: Self-pay

## 2023-11-05 DIAGNOSIS — Z780 Asymptomatic menopausal state: Secondary | ICD-10-CM | POA: Diagnosis not present

## 2023-11-05 DIAGNOSIS — E039 Hypothyroidism, unspecified: Secondary | ICD-10-CM | POA: Diagnosis not present

## 2023-11-05 DIAGNOSIS — M8580 Other specified disorders of bone density and structure, unspecified site: Secondary | ICD-10-CM | POA: Diagnosis not present

## 2023-11-05 DIAGNOSIS — Z9189 Other specified personal risk factors, not elsewhere classified: Secondary | ICD-10-CM | POA: Insufficient documentation

## 2023-11-05 DIAGNOSIS — R7303 Prediabetes: Secondary | ICD-10-CM

## 2023-11-05 DIAGNOSIS — E559 Vitamin D deficiency, unspecified: Secondary | ICD-10-CM

## 2023-11-05 NOTE — Telephone Encounter (Addendum)
 Hello,  Patient will be scheduled as soon as possible.  Auth Submission: NO AUTH NEEDED Site of care: Site of care: [facility] Payer: [employer] Medication & CPT/J Code(s) submitted: Reclast  (Zolendronic acid) [id number] Diagnosis Code: m81.0 Route of submission (phone, fax, portal): portal Phone # Fax # Auth type: Buy/Bill PB Units/visits requested: 5mg , 1 dose Reference number: [id number]  Approval from: 11/05/23 to 05/18/24

## 2023-11-05 NOTE — Progress Notes (Signed)
 Outpatient Endocrinology Note [place] Karena Kinker, MD  11/07/23  Patient's Name: Brittany Figueroa    DOB: Jan 18, 1941    MRN: [id number]                                                    REASON OF VISIT: Follow up of prediabetes / hypothyroidism  PCP: Charlott Dorn LABOR, MD  HISTORY OF PRESENT ILLNESS:   Brittany Figueroa is a 83 y.o. old female with past medical history listed below, is here for follow up for prediabetes / hypothyroidism saralee.  Pertinent Diabetes History: Patient has prediabetes 10+ years.  She had hemoglobin A1c in the range of 6%.  In the past she had hemoglobin A1c up to 6.8% however likely related with steroid use per prior endocrine office note.  She was being managed as prediabetes. She has been intermittently on metformin  extended release since April 2017.  Current / Home Diabetic regimen includes:  None  Glycemic data:   Freestyle libre glucometer download data reviewed and acceptable as noted : 85, 88, 110, 126, 78, 127, 107, 121, 131, 113, 123. She has been checking intermittently few times a week.    # OSTEOPENIA:   Lowest T score is at the right neck femur previously -2.4  This is only 0.8 as of 11/14/2019 BMD at the left femoral neck was -2.0 compared to -1.8   Results from 08/14/2022 done on a different machine:    Lumbar spine L1-L2 Femoral neck (FN)    T-score -2.0 RFN: -2.2 LFN: -1.7    Apparently had tried Fosamax in the past and this caused some GI side effects She received RECLAST  infusions on 12/15/17, 01/07/2020 and also 6/23.  Plan was to continue Reclast  every 2 years for osteopenia.   # Vitamin D  deficiency: She is taking OTC vitamin D  4000 U daily.  # HYPOTHYROIDISM: TSH level previously has been as high as 5.6, this spontaneously improved but in 05/2018 was mildly increased again. At that time she was complaining of some fatigue, constipation, dry skin, some cold intolerance and difficulty losing weight.   She is taking levothyroxine   37.5 mcg daily, she did have some improvement in her fatigue with the higher dose.  Interval history  She has not been taking metformin  lately.  Hemoglobin A1c remains in prediabetes range 6.4% in April.  Glucometer data as reviewed and noted above.  She has been taking levothyroxine  37.5 mcg daily.  Recent thyroid  function test normal.  She has no hypo and hyperthyroid symptoms.  No palpitation.  She has been taking calcium  and vitamin D  supplement.  Vitamin D  level is 35.  No following fracture.  No other complaints today.   Latest Reference Range & Units 11/02/23 08:38  TSH 0.40 - 4.50 mIU/L 2.02  T4,Free(Direct) 0.8 - 1.8 ng/dL 1.6    Latest Reference Range & Units 11/02/23 08:38  Vitamin D , 25-Hydroxy 30 - 100 ng/mL 35    REVIEW OF SYSTEMS As per history of present illness.   PAST MEDICAL HISTORY: Past Medical History:  Diagnosis Date   Abnormal finding on EKG, new anterolateral T-wave inversions  05/14/2012   Abnormal nuclear stress test, with infero-lateral ischemia 05/14/2012   Arthritis    right thumb; all my fingers (05/13/2012), cervical spondylosis    Atypical angina (HCC) 05/14/2012   CAD (coronary  artery disease), 05/13/12, with 80% LAD 05/14/2012   Cancer (HCC)    basal cell on facial in L temporal region     [facility] arthritis, thumb, degenerative    Complication of anesthesia 12/2009   had endoscopy; larynx went into spasms; stopped breathing for 15 seconds (05/13/2012)   GERD (gastroesophageal reflux disease) 2011   Hypercholesteremia    Hypertension    S/P angioplasty with stent, LAD 05/13/12 05/14/2012   Tuberculosis    test +- [place] [facility] - Dr. Sherrilee- CXR- OK    PAST SURGICAL HISTORY: Past Surgical History:  Procedure Laterality Date   ANTERIOR CERVICAL DECOMP/DISCECTOMY FUSION N/A 12/13/2013   Procedure: ANTERIOR CERVICAL DECOMPRESSION/DISCECTOMY FUSION 3 LEVELS Cervical four/five,five/six,six/seven. Anterior cervical disectomy and fusion with peek and  plate ;  Surgeon: Victory DELENA Gunnels, MD;  Location: [facility] NEURO ORS;  Service: Neurosurgery;  Laterality: N/A;   CARDIAC CATHETERIZATION N/A 07/02/2015   Procedure: Left Heart Cath and Coronary Angiography;  Surgeon: Dorn JINNY Lesches, MD;  Location: [facility] INVASIVE CV LAB;  Service: Cardiovascular;  Laterality: N/A;   CORONARY ANGIOPLASTY WITH STENT PLACEMENT  05/13/2012   DES to LAD; she has total RCA with left to rt coll. normal LV function done for positive nuc study   DILATION AND CURETTAGE OF UTERUS  1960's; 1970's; 1980   probably 3 (05/13/2012)   FOREARM / WRIST TENDON LESION EXCISION  ?11/2001   right; did waver to try to get ulnar out of hole that it had cut (05/13/2012   INGUINAL HERNIA REPAIR  ~ 1966; 07/21/2002   right; left (05/13/2012)   LEFT HEART CATHETERIZATION WITH CORONARY ANGIOGRAM N/A 05/13/2012   Procedure: LEFT HEART CATHETERIZATION WITH CORONARY ANGIOGRAM;  Surgeon: Dorn JINNY Lesches, MD;  Location: [facility] CATH LAB;  Service: Cardiovascular;  Laterality: N/A;   OSTEOTOMY AND ULNAR SHORTENING  07/21/2002   right (05/13/2012)   TONSILLECTOMY AND ADENOIDECTOMY  1950's   TUBAL LIGATION  1980    ALLERGIES: Allergies  Allergen Reactions   Statins Other (See Comments)    intermittent loss of circulation; hands and arms will go to sleep; feet will get cramps; fatigue (05/13/2012).  This occurred with Lipitor, Zocor, Crestor  5 mg qd, and she thinks pravastatin  as well   Amlodipine  Itching   Gadolinium Derivatives Itching and Nausea Only    Pt had severe nausea and itching on legs, neck and back, per dr shoshana pt needs 13 hour prep before contrast in the future    FAMILY HISTORY:  Family History  Problem Relation Age of Onset   Hypertension Mother    Diabetes Mother    Hyperlipidemia Father    Heart disease Father    Stroke Father    Heart disease Sister    Diabetes Sister    Heart disease Brother    Diabetes Maternal Aunt     SOCIAL HISTORY: Social History    Socioeconomic History   Marital status: Divorced    Spouse name: Not on file   Number of children: Not on file   Years of education: Not on file   Highest education level: Not on file  Occupational History   Not on file  Tobacco Use   Smoking status: Former    Current packs/day: 0.00    Average packs/day: 0.5 packs/day for 20.0 years (10.0 ttl pk-yrs)    Types: Cigarettes    Start date: 07/05/1968    Quit date: 07/05/1988    Years since quitting: 35.3   Smokeless tobacco: Never  Vaping  Use   Vaping status: Never Used  Substance and Sexual Activity   Alcohol use: Yes    Alcohol/week: 7.0 standard drinks of alcohol    Types: 7 Glasses of wine per week    Comment: a glass of wine with meals   Drug use: No   Sexual activity: Never  Other Topics Concern   Not on file  Social History Narrative   Not on file   Social Drivers of Health   Financial Resource Strain: Not on file  Food Insecurity: No Food Insecurity (07/29/2023)   Hunger Vital Sign    Worried About Running Out of Food in the Last Year: Never true    Ran Out of Food in the Last Year: Never true  Transportation Needs: No Transportation Needs (07/29/2023)   PRAPARE - Administrator, Civil Service (Medical): No    Lack of Transportation (Non-Medical): No  Physical Activity: Not on file  Stress: Not on file  Social Connections: Moderately Isolated (07/29/2023)   Social Connection and Isolation Panel    Frequency of Communication with Friends and Family: More than three times a week    Frequency of Social Gatherings with Friends and Family: More than three times a week    Attends Religious Services: More than 4 times per year    Active Member of [employer] or Organizations: No    Attends Banker Meetings: Never    Marital Status: Divorced    MEDICATIONS:  Current Outpatient Medications  Medication Sig Dispense Refill   acetaminophen  (TYLENOL ) 500 MG tablet Take 500 mg by mouth every 6 (six)  hours as needed for mild pain or headache.     aspirin  EC (ASPIRIN  81) 81 MG tablet Take 1 tablet (81 mg total) by mouth daily. 90 tablet 3   calcium -vitamin D  (OSCAL WITH D) 500-200 MG-UNIT per tablet Take 1 tablet by mouth daily.     carvedilol  (COREG ) 25 MG tablet Take 25 mg by mouth 2 (two) times daily with a meal.     Cholecalciferol (VITAMIN D3) 2000 UNITS TABS Take 2,000 Units by mouth 2 (two) times daily.     clopidogrel  (PLAVIX ) 75 MG tablet TAKE 1 TABLET(75 MG) BY MOUTH DAILY 90 tablet 1   diclofenac sodium (VOLTAREN) 1 % GEL Apply 2 g topically daily as needed. For pain     DILT-XR 240 MG 24 hr capsule Take 240 mg by mouth daily.     Evolocumab  (REPATHA  SURECLICK) 140 MG/ML SOAJ INJECT CONTENTS OF ONE PEN UNDER THE SKIN EVERY 14 DAYS 2 mL 5   ezetimibe  (ZETIA ) 10 MG tablet TAKE 1 TABLET(10 MG) BY MOUTH DAILY (Patient taking differently: Take 10 mg by mouth at bedtime.) 90 tablet 2   famotidine (PEPCID) 20 MG tablet Take 20 mg by mouth at bedtime.     fexofenadine (ALLEGRA) 180 MG tablet Take 180 mg by mouth daily as needed for allergies or rhinitis.     glucose blood (ONETOUCH VERIO) test strip Use to test blood sugar once daily Dx code E11.9 30 each 4   icosapent  Ethyl (VASCEPA ) 1 g capsule Take 2 capsules (2 g total) by mouth 2 (two) times daily. 360 capsule 3   irbesartan  (AVAPRO ) 300 MG tablet TAKE 1/2 TABLET BY MOUTH TWICE DAILY IN THE MORNING AND AT BEDTIME 90 tablet 3   levothyroxine  (SYNTHROID ) 25 MCG tablet TAKE 1 AND 1/2 TABLETS BY MOUTH DAILY BEFORE BREAKFAST 135 tablet 1   Multiple Vitamin (MULTIVITAMIN WITH  MINERALS) TABS Take 1 tablet by mouth daily.     Multiple Vitamins-Minerals (PRESERVISION AREDS 2+MULTI VIT PO) Take 1 tablet by mouth in the morning and at bedtime.     nitroGLYCERIN  (NITROSTAT ) 0.4 MG SL tablet PLACE 1 TABLET UNDER THE TONGUE EVERY 5 MINUTES AS NEEDED FOR CHEST PAIN 25 tablet 1   No current facility-administered medications for this visit.     PHYSICAL EXAM: There were no vitals filed for this visit.  There is no height or weight on file to calculate BMI.  Wt Readings from Last 3 Encounters:  07/29/23 134 lb 14.7 oz (61.2 kg)  07/28/23 135 lb (61.2 kg)  03/30/23 135 lb (61.2 kg)    General: Well developed, well nourished female in no apparent distress.  HEENT: AT/[place], no external lesions.  Eyes: Conjunctiva clear and no icterus. Neck: Neck supple  Lungs: Respirations not labored Neurologic: Alert, oriented, normal speech Extremities / Skin: Dry.  Psychiatric: Does not appear depressed or anxious  Diabetic Foot Exam - Simple   No data filed   LABS Reviewed Lab Results  Component Value Date   HGBA1C 6.3 (H) 07/21/2023   HGBA1C 6.6 (H) 03/23/2023   HGBA1C 6.5 10/15/2022   Lab Results  Component Value Date   FRUCTOSAMINE 242 06/21/2015   Lab Results  Component Value Date   CHOL 214 (H) 03/04/2023   HDL 50 03/04/2023   LDLCALC 111 (H) 03/04/2023   LDLDIRECT 130.1 04/04/2014   TRIG 311 (H) 03/04/2023   CHOLHDL 4.3 03/04/2023   Lab Results  Component Value Date   MICRALBCREAT 1.0 03/30/2023   MICRALBCREAT 1.3 07/17/2020   Lab Results  Component Value Date   CREATININE 0.53 07/30/2023   Lab Results  Component Value Date   GFR 83.63 03/23/2023    ASSESSMENT / PLAN  1. Acquired hypothyroidism   2. Osteopenia after menopause   3. Fracture Risk Assessment Score (FRAX) indicating greater than 3% risk for hip fracture   4. Prediabetes   5. Vitamin D  deficiency     # Prediabetes -Patient is being managed as prediabetes.  However she had hemoglobin A1c of more than 6.5% in the past per office note was likely related to steroid intake.  In 03/2023 hemoglobin A1c 6.6%, patient reports she has not been fully compliant with metformin .  She likes to be very compliant and also diet control and would like to be managed as prediabetes at this time.  Technically she has type 2 diabetes mellitus.  She reports  she can be motivated to keep on prediabetes range and would like to a fourth more to get improvement if we manage her as of prediabetes. -Metformin  was stopped by PCP due to GERD symptoms March 2025.  Okay to be off of metformin  for now.  Check blood sugar in the morning fasting daily and occasionally at bedtime.  Blood sugar testing pending acceptable of metformin .  Hemoglobin A1c was 6.4% in April 2025.  # Osteopenia -She is status post 3 infusion of Reclast  with last 1 in June 2023.  She had last DEXA scan in March 2024 consistent with osteopenia. -Continue current calcium  and vitamin D  supplement. -Continue Reclast  every 2 years for osteopenia, will plan next Reclast  infusion in July 2025.  Order placed.  # Hypothyroidism -Continue current dose of levothyroxine  37.5 mcg daily.  1 and half tablets of 25 mcg levothyroxine .  Patient thyroid  function test normal.  Diagnoses and all orders for this visit:  Acquired hypothyroidism -  T4, free -     TSH  Osteopenia after menopause  Fracture Risk Assessment Score (FRAX) indicating greater than 3% risk for hip fracture  Prediabetes  Vitamin D  deficiency  Other orders -     0.9 %  sodium chloride  infusion -     alteplase (CATHFLO ACTIVASE) injection 2 mg -     heparin  lock flush 100 unit/mL -     sodium chloride  flush (NS) 0.9 % injection 10 mL -     sodium chloride  flush (NS) 0.9 % injection 3 mL -     anticoagulant sodium citrate solution 5 mL -     heparin  lock flush 100 unit/mL -     acetaminophen  (TYLENOL ) tablet 650 mg -     diphenhydrAMINE (BENADRYL) capsule 25 mg -     zoledronic  acid (RECLAST ) injection 5 mg -     famotidine (PEPCID) 20 mg in sodium chloride  0.9 % 50 mL IVPB -     0.9 %  sodium chloride  infusion -     methylPREDNISolone  sodium succinate (SOLU-MEDROL ) 125 mg/2 mL injection 125 mg -     diphenhydrAMINE (BENADRYL) injection 50 mg -     albuterol (VENTOLIN HFA) 108 (90 Base) MCG/ACT inhaler 2 puff -      EPINEPHrine (EPI-PEN) injection 0.3 mg      DISPOSITION Follow up in clinic in 6 months suggested.  Labs prior to follow-up visit as ordered.   All questions answered and patient verbalized understanding of the plan.  [place] Liam Bossman, MD [facility] [address] [place], [address] [place], [place] [address] Phone # [phone]  At least part of this note was generated using voice recognition software. Inadvertent word errors may have occurred, which were not recognized during the proofreading process.

## 2023-11-05 NOTE — Patient Instructions (Addendum)
 Latest Reference Range & Units 02/05/22 08:30 06/16/22 08:27 10/15/22 07:59 03/23/23 08:08 06/11/23 00:00 07/21/23 08:17 09/08/23 10:26  A1c      6.0 (E)  6.4 (E)  Hemoglobin A1C <5.7 % of total Hgb 6.4 6.8 (H) 6.5 6.6 (H)  6.3 (H)   (H): Data is abnormally high (E): External lab result  Will plan for Reclast .   Same of levothyroxine  37.5 mcg daily.

## 2023-11-07 ENCOUNTER — Encounter: Payer: Self-pay | Admitting: Endocrinology

## 2023-11-13 DIAGNOSIS — H01004 Unspecified blepharitis left upper eyelid: Secondary | ICD-10-CM | POA: Diagnosis not present

## 2023-11-13 DIAGNOSIS — B880 Other acariasis: Secondary | ICD-10-CM | POA: Diagnosis not present

## 2023-11-13 DIAGNOSIS — H01001 Unspecified blepharitis right upper eyelid: Secondary | ICD-10-CM | POA: Diagnosis not present

## 2023-11-16 ENCOUNTER — Ambulatory Visit

## 2023-11-16 VITALS — BP 117/66 | HR 75 | Temp 98.0°F | Resp 16 | Ht 61.0 in | Wt 133.0 lb

## 2023-11-16 DIAGNOSIS — I10 Essential (primary) hypertension: Secondary | ICD-10-CM | POA: Diagnosis not present

## 2023-11-16 DIAGNOSIS — Z9189 Other specified personal risk factors, not elsewhere classified: Secondary | ICD-10-CM

## 2023-11-16 DIAGNOSIS — M8580 Other specified disorders of bone density and structure, unspecified site: Secondary | ICD-10-CM | POA: Diagnosis not present

## 2023-11-16 DIAGNOSIS — I25119 Atherosclerotic heart disease of native coronary artery with unspecified angina pectoris: Secondary | ICD-10-CM | POA: Diagnosis not present

## 2023-11-16 DIAGNOSIS — Z780 Asymptomatic menopausal state: Secondary | ICD-10-CM | POA: Diagnosis not present

## 2023-11-16 DIAGNOSIS — E1159 Type 2 diabetes mellitus with other circulatory complications: Secondary | ICD-10-CM | POA: Diagnosis not present

## 2023-11-16 DIAGNOSIS — I2510 Atherosclerotic heart disease of native coronary artery without angina pectoris: Secondary | ICD-10-CM | POA: Diagnosis not present

## 2023-11-16 MED ORDER — DIPHENHYDRAMINE HCL 25 MG PO CAPS
25.0000 mg | ORAL_CAPSULE | Freq: Once | ORAL | Status: AC
  Administered 2023-11-16: 25 mg via ORAL
  Filled 2023-11-16: qty 1

## 2023-11-16 MED ORDER — ZOLEDRONIC ACID 5 MG/100ML IV SOLN
5.0000 mg | Freq: Once | INTRAVENOUS | Status: AC
  Administered 2023-11-16: 5 mg via INTRAVENOUS
  Filled 2023-11-16: qty 100

## 2023-11-16 MED ORDER — SODIUM CHLORIDE 0.9 % IV SOLN: INTRAVENOUS | Status: DC

## 2023-11-16 MED ORDER — ACETAMINOPHEN 325 MG PO TABS
650.0000 mg | ORAL_TABLET | Freq: Once | ORAL | Status: AC
  Administered 2023-11-16: 650 mg via ORAL
  Filled 2023-11-16: qty 2

## 2023-11-16 NOTE — Progress Notes (Signed)
 Diagnosis: Osteopenia  Provider:  Praveen Mannam MD  Procedure: IV Infusion  IV Type: Peripheral, IV Location: L Antecubital  Reclast  (Zolendronic Acid), Dose: 5 mg  Infusion Start Time: 1510  Infusion Stop Time: 1542  Post Infusion IV Care: Observation period completed  Discharge: Condition: Good, Destination: Home . AVS Declined  Performed by:  Eleanor DELENA Bloch, RN

## 2023-11-18 DIAGNOSIS — M25551 Pain in right hip: Secondary | ICD-10-CM | POA: Diagnosis not present

## 2023-11-27 DIAGNOSIS — H353122 Nonexudative age-related macular degeneration, left eye, intermediate dry stage: Secondary | ICD-10-CM | POA: Diagnosis not present

## 2023-12-08 DIAGNOSIS — R2681 Unsteadiness on feet: Secondary | ICD-10-CM | POA: Diagnosis not present

## 2023-12-08 DIAGNOSIS — Z9181 History of falling: Secondary | ICD-10-CM | POA: Diagnosis not present

## 2023-12-10 ENCOUNTER — Other Ambulatory Visit: Payer: Self-pay | Admitting: Cardiovascular Disease

## 2023-12-10 DIAGNOSIS — R2681 Unsteadiness on feet: Secondary | ICD-10-CM | POA: Diagnosis not present

## 2023-12-10 DIAGNOSIS — Z9181 History of falling: Secondary | ICD-10-CM | POA: Diagnosis not present

## 2023-12-12 ENCOUNTER — Other Ambulatory Visit: Payer: Self-pay | Admitting: Cardiovascular Disease

## 2023-12-16 DIAGNOSIS — I25119 Atherosclerotic heart disease of native coronary artery with unspecified angina pectoris: Secondary | ICD-10-CM | POA: Diagnosis not present

## 2023-12-16 DIAGNOSIS — E1159 Type 2 diabetes mellitus with other circulatory complications: Secondary | ICD-10-CM | POA: Diagnosis not present

## 2023-12-16 DIAGNOSIS — E871 Hypo-osmolality and hyponatremia: Secondary | ICD-10-CM | POA: Diagnosis not present

## 2023-12-16 DIAGNOSIS — R2681 Unsteadiness on feet: Secondary | ICD-10-CM | POA: Diagnosis not present

## 2023-12-16 DIAGNOSIS — I10 Essential (primary) hypertension: Secondary | ICD-10-CM | POA: Diagnosis not present

## 2023-12-16 DIAGNOSIS — I2510 Atherosclerotic heart disease of native coronary artery without angina pectoris: Secondary | ICD-10-CM | POA: Diagnosis not present

## 2023-12-16 DIAGNOSIS — R54 Age-related physical debility: Secondary | ICD-10-CM | POA: Diagnosis not present

## 2023-12-17 ENCOUNTER — Other Ambulatory Visit: Payer: Self-pay | Admitting: Cardiovascular Disease

## 2023-12-17 DIAGNOSIS — E1159 Type 2 diabetes mellitus with other circulatory complications: Secondary | ICD-10-CM | POA: Diagnosis not present

## 2023-12-17 DIAGNOSIS — I10 Essential (primary) hypertension: Secondary | ICD-10-CM | POA: Diagnosis not present

## 2023-12-17 DIAGNOSIS — I25119 Atherosclerotic heart disease of native coronary artery with unspecified angina pectoris: Secondary | ICD-10-CM | POA: Diagnosis not present

## 2023-12-17 DIAGNOSIS — I2510 Atherosclerotic heart disease of native coronary artery without angina pectoris: Secondary | ICD-10-CM | POA: Diagnosis not present

## 2023-12-18 ENCOUNTER — Other Ambulatory Visit: Payer: Self-pay | Admitting: Cardiovascular Disease

## 2023-12-22 DIAGNOSIS — H5203 Hypermetropia, bilateral: Secondary | ICD-10-CM | POA: Diagnosis not present

## 2023-12-22 DIAGNOSIS — H52223 Regular astigmatism, bilateral: Secondary | ICD-10-CM | POA: Diagnosis not present

## 2023-12-22 DIAGNOSIS — H353211 Exudative age-related macular degeneration, right eye, with active choroidal neovascularization: Secondary | ICD-10-CM | POA: Diagnosis not present

## 2023-12-22 DIAGNOSIS — H524 Presbyopia: Secondary | ICD-10-CM | POA: Diagnosis not present

## 2023-12-22 DIAGNOSIS — H2513 Age-related nuclear cataract, bilateral: Secondary | ICD-10-CM | POA: Diagnosis not present

## 2023-12-22 DIAGNOSIS — H353121 Nonexudative age-related macular degeneration, left eye, early dry stage: Secondary | ICD-10-CM | POA: Diagnosis not present

## 2023-12-22 DIAGNOSIS — H25011 Cortical age-related cataract, right eye: Secondary | ICD-10-CM | POA: Diagnosis not present

## 2023-12-23 DIAGNOSIS — R2681 Unsteadiness on feet: Secondary | ICD-10-CM | POA: Diagnosis not present

## 2023-12-23 DIAGNOSIS — Z9181 History of falling: Secondary | ICD-10-CM | POA: Diagnosis not present

## 2023-12-25 DIAGNOSIS — R2681 Unsteadiness on feet: Secondary | ICD-10-CM | POA: Diagnosis not present

## 2023-12-25 DIAGNOSIS — Z9181 History of falling: Secondary | ICD-10-CM | POA: Diagnosis not present

## 2023-12-27 DIAGNOSIS — H353122 Nonexudative age-related macular degeneration, left eye, intermediate dry stage: Secondary | ICD-10-CM | POA: Diagnosis not present

## 2023-12-28 ENCOUNTER — Encounter: Payer: Self-pay | Admitting: Cardiovascular Disease

## 2023-12-28 ENCOUNTER — Ambulatory Visit: Attending: Cardiovascular Disease | Admitting: Cardiovascular Disease

## 2023-12-28 VITALS — BP 142/74 | HR 80 | Ht 60.05 in | Wt 132.0 lb

## 2023-12-28 DIAGNOSIS — I2510 Atherosclerotic heart disease of native coronary artery without angina pectoris: Secondary | ICD-10-CM

## 2023-12-28 DIAGNOSIS — E785 Hyperlipidemia, unspecified: Secondary | ICD-10-CM | POA: Diagnosis not present

## 2023-12-28 DIAGNOSIS — I10 Essential (primary) hypertension: Secondary | ICD-10-CM

## 2023-12-28 DIAGNOSIS — I4510 Unspecified right bundle-branch block: Secondary | ICD-10-CM | POA: Diagnosis not present

## 2023-12-28 NOTE — Assessment & Plan Note (Signed)
 History of essential hypertension with blood pressure measured today 142/74.  She is on Avapro  and carvedilol .

## 2023-12-28 NOTE — Assessment & Plan Note (Signed)
 History of dyslipidemia intolerant to statin therapy on Repatha , Zetia  and Lovaza .  Her last lipid profile performed 06/11/2023 revealed total cholesterol 193, LDL 104 and HDL of 52.  Her triglyceride level was mildly elevated at 215.

## 2023-12-28 NOTE — Patient Instructions (Signed)

## 2023-12-28 NOTE — Assessment & Plan Note (Signed)
 History of CAD status post cardiac catheterization by myself 05/13/2012 revealing 80% proximal LAD lesion which I stented using a drug-eluting stent.  She also had a dominant RCA with left-to-right collaterals that was occluded with normal LV function.  She is active and has remained asymptomatic.

## 2023-12-28 NOTE — Progress Notes (Signed)
 12/28/2023 Brittany Figueroa   15-Jun-1940  [id number]  Primary Physician Charlott Dorn LABOR, MD Primary Cardiologist: Dorn JINNY Lesches MD FACP, [place], [place], [place]  HPI:  Brittany Figueroa is a 83 y.o.   mildly overweight divorced Caucasian female, mother to 2, grandmother to 3 grandchildren, who I last saw in the office 11/19/2022. She has a history of hypertension and hyperlipidemia. I saw her in December of 2013 with new anterolateral T-wave inversion and a Myoview that showed inferolateral ischemia that was new compared to a prior study. She did have some unusual symptoms at that time. Based on this, I catheterized her on May 13, 2012, revealing an 80% proximal LAD lesion, which I stented using a drug-eluting stent, as well as a total dominant RCA with left to right collaterals and normal LV function. Since stenting her LAD, she is ultimately asymptomatic. Her other problems include hypertension and hyperlipidemia. I have reviewed her blood pressures, which were recorded during cardiac rehab, which were all normal. Her recent blood work revealed a total cholesterol of 161, LDL of 89 and HDL of 40.she denies chest pain or shortness of breath. She had a recent Myoview stress test performed several months ago that showed ischemia in the RCA territory but otherwise unremarkable is explainable by her known total dominant RCA with left to right collaterals. Chief complaint of excruciating neck pain thought to be related to cervical disc disease. She was evaluated by Dr. Because of her neck pain demonstrated cervical disc disease requiring fusion. Because of this she will need to undergo pharmacologic Myoview stress testing to risk stratify her. She underwent cervical discectomy by Dr. Victory Boers in 2016 with excellent clinical result. She no longer is in pain. Her major issue now is treatment of her hyperlipidemia. She is statin intolerant and complains of short-term memory loss.   She did contract  COVID-19 in July of last year but has recovered.  She is tolerating Repatha  without side effects excellent lipid profile with LDL of 74 measured 03/14/2019.     Since I saw her in the office a year ago she continues to do well.  She denies chest pain or shortness of breath.     Current Meds  Medication Sig   aspirin  EC (ASPIRIN  81) 81 MG tablet Take 1 tablet (81 mg total) by mouth daily.   calcium -vitamin D  (OSCAL WITH D) 500-200 MG-UNIT per tablet Take 1 tablet by mouth daily.   carvedilol  (COREG ) 12.5 MG tablet Take 12.5 mg by mouth 2 (two) times daily.   Cholecalciferol (VITAMIN D3) 2000 UNITS TABS Take 2,000 Units by mouth 2 (two) times daily.   clopidogrel  (PLAVIX ) 75 MG tablet TAKE 1 TABLET(75 MG) BY MOUTH DAILY   diclofenac sodium (VOLTAREN) 1 % GEL Apply 2 g topically daily as needed. For pain   DILT-XR 240 MG 24 hr capsule Take 240 mg by mouth daily.   Evolocumab  (REPATHA  SURECLICK) 140 MG/ML SOAJ INJECT CONTENTS OF ONE PEN UNDER THE SKIN EVERY 14 DAYS   ezetimibe  (ZETIA ) 10 MG tablet TAKE 1 TABLET(10 MG) BY MOUTH DAILY   famotidine (PEPCID) 20 MG tablet Take 20 mg by mouth at bedtime. (Patient taking differently: Take 20 mg by mouth as needed.)   icosapent  Ethyl (VASCEPA ) 1 g capsule Take 2 capsules (2 g total) by mouth 2 (two) times daily.   irbesartan  (AVAPRO ) 300 MG tablet TAKE 1/2 TABLET BY MOUTH TWICE DAILY IN THE MORNING AND AT BEDTIME (Patient taking differently:  Take 300 mg by mouth in the morning. TAKE 1/2 TABLET BY MOUTH TWICE DAILY IN THE MORNING AND AT BEDTIME)   levothyroxine  (SYNTHROID ) 25 MCG tablet TAKE 1 AND 1/2 TABLETS BY MOUTH DAILY BEFORE BREAKFAST   Multiple Vitamin (MULTIVITAMIN WITH MINERALS) TABS Take 1 tablet by mouth daily.   Multiple Vitamins-Minerals (PRESERVISION AREDS 2+MULTI VIT PO) Take 1 tablet by mouth in the morning and at bedtime.     Allergies  Allergen Reactions   Statins Other (See Comments)    intermittent loss of circulation; hands and  arms will go to sleep; feet will get cramps; fatigue (05/13/2012).  This occurred with Lipitor, Zocor, Crestor  5 mg qd, and she thinks pravastatin  as well   Amlodipine  Itching   Gadolinium Derivatives Itching and Nausea Only    Pt had severe nausea and itching on legs, neck and back, per dr shoshana pt needs 13 hour prep before contrast in the future    Social History   Socioeconomic History   Marital status: Divorced    Spouse name: Not on file   Number of children: Not on file   Years of education: Not on file   Highest education level: Not on file  Occupational History   Not on file  Tobacco Use   Smoking status: Former    Current packs/day: 0.00    Average packs/day: 0.5 packs/day for 20.0 years (10.0 ttl pk-yrs)    Types: Cigarettes    Start date: 07/05/1968    Quit date: 07/05/1988    Years since quitting: 35.5   Smokeless tobacco: Never  Vaping Use   Vaping status: Never Used  Substance and Sexual Activity   Alcohol use: Yes    Alcohol/week: 7.0 standard drinks of alcohol    Types: 7 Glasses of wine per week    Comment: a glass of wine with meals   Drug use: No   Sexual activity: Never  Other Topics Concern   Not on file  Social History Narrative   Not on file   Social Drivers of Health   Financial Resource Strain: Not on file  Food Insecurity: No Food Insecurity (07/29/2023)   Hunger Vital Sign    Worried About Running Out of Food in the Last Year: Never true    Ran Out of Food in the Last Year: Never true  Transportation Needs: No Transportation Needs (07/29/2023)   PRAPARE - Administrator, Civil Service (Medical): No    Lack of Transportation (Non-Medical): No  Physical Activity: Not on file  Stress: Not on file  Social Connections: Moderately Isolated (07/29/2023)   Social Connection and Isolation Panel    Frequency of Communication with Friends and Family: More than three times a week    Frequency of Social Gatherings with Friends and Family:  More than three times a week    Attends Religious Services: More than 4 times per year    Active Member of [employer] or Organizations: No    Attends Banker Meetings: Never    Marital Status: Divorced  Catering manager Violence: Not At Risk (07/29/2023)   Humiliation, Afraid, Rape, and Kick questionnaire    Fear of Current or Ex-Partner: No    Emotionally Abused: No    Physically Abused: No    Sexually Abused: No     Review of Systems: General: negative for chills, fever, night sweats or weight changes.  Cardiovascular: negative for chest pain, dyspnea on exertion, edema, orthopnea, palpitations, paroxysmal nocturnal dyspnea  or shortness of breath Dermatological: negative for rash Respiratory: negative for cough or wheezing Urologic: negative for hematuria Abdominal: negative for nausea, vomiting, diarrhea, bright red blood per rectum, melena, or hematemesis Neurologic: negative for visual changes, syncope, or dizziness All other systems reviewed and are otherwise negative except as noted above.    Blood pressure (!) 142/74, pulse 80, height 5' 0.05 (1.525 m), weight 132 lb (59.9 kg), SpO2 96%.  General appearance: alert and no distress Neck: no adenopathy, no carotid bruit, no JVD, supple, symmetrical, trachea midline, and thyroid  not enlarged, symmetric, no tenderness/mass/nodules Lungs: clear to auscultation bilaterally Heart: regular rate and rhythm, S1, S2 normal, no murmur, click, rub or gallop Extremities: extremities normal, atraumatic, no cyanosis or edema Pulses: 2+ and symmetric Skin: Skin color, texture, turgor normal. No rashes or lesions Neurologic: Grossly normal  EKG EKG Interpretation Date/Time:  Monday December 28 2023 11:38:33 EDT Ventricular Rate:  69 PR Interval:  178 QRS Duration:  100 QT Interval:  392 QTC Calculation: 420 R Axis:   -51  Text Interpretation: Normal sinus rhythm Left anterior fascicular block Minimal voltage criteria for LVH,  may be normal variant ( Cornell product ) Septal infarct , age undetermined When compared with ECG of 19-Nov-2022 10:57, Incomplete right bundle branch block is no longer Present Septal infarct is now Present Confirmed by Court Carrier [phone]) on 12/28/2023 11:39:20 AM    ASSESSMENT AND PLAN:   CAD- residual total RCA and Dx disease History of CAD status post cardiac catheterization by myself 05/13/2012 revealing 80% proximal LAD lesion which I stented using a drug-eluting stent.  She also had a dominant RCA with left-to-right collaterals that was occluded with normal LV function.  She is active and has remained asymptomatic.  Dyslipidemia History of dyslipidemia intolerant to statin therapy on Repatha , Zetia  and Lovaza .  Her last lipid profile performed 06/11/2023 revealed total cholesterol 193, LDL 104 and HDL of 52.  Her triglyceride level was mildly elevated at 215.  Essential hypertension History of essential hypertension with blood pressure measured today 142/74.  She is on Avapro  and carvedilol .     Carrier DOROTHA Court MD [facility], [facility] 12/28/2023 11:39 AM

## 2024-01-03 ENCOUNTER — Other Ambulatory Visit: Payer: Self-pay | Admitting: Cardiovascular Disease

## 2024-01-05 DIAGNOSIS — H2513 Age-related nuclear cataract, bilateral: Secondary | ICD-10-CM | POA: Diagnosis not present

## 2024-01-05 DIAGNOSIS — H353122 Nonexudative age-related macular degeneration, left eye, intermediate dry stage: Secondary | ICD-10-CM | POA: Diagnosis not present

## 2024-01-05 DIAGNOSIS — H353211 Exudative age-related macular degeneration, right eye, with active choroidal neovascularization: Secondary | ICD-10-CM | POA: Diagnosis not present

## 2024-01-05 DIAGNOSIS — H43813 Vitreous degeneration, bilateral: Secondary | ICD-10-CM | POA: Diagnosis not present

## 2024-01-05 DIAGNOSIS — H35033 Hypertensive retinopathy, bilateral: Secondary | ICD-10-CM | POA: Diagnosis not present

## 2024-01-16 ENCOUNTER — Other Ambulatory Visit: Payer: Self-pay | Admitting: Cardiovascular Disease

## 2024-01-17 ENCOUNTER — Other Ambulatory Visit: Payer: Self-pay | Admitting: Cardiovascular Disease

## 2024-01-17 DIAGNOSIS — I10 Essential (primary) hypertension: Secondary | ICD-10-CM | POA: Diagnosis not present

## 2024-01-17 DIAGNOSIS — I25119 Atherosclerotic heart disease of native coronary artery with unspecified angina pectoris: Secondary | ICD-10-CM | POA: Diagnosis not present

## 2024-01-17 DIAGNOSIS — I2510 Atherosclerotic heart disease of native coronary artery without angina pectoris: Secondary | ICD-10-CM | POA: Diagnosis not present

## 2024-01-17 DIAGNOSIS — E1159 Type 2 diabetes mellitus with other circulatory complications: Secondary | ICD-10-CM | POA: Diagnosis not present

## 2024-01-20 ENCOUNTER — Other Ambulatory Visit: Payer: Self-pay | Admitting: Cardiovascular Disease

## 2024-01-20 DIAGNOSIS — E785 Hyperlipidemia, unspecified: Secondary | ICD-10-CM

## 2024-01-20 DIAGNOSIS — I2510 Atherosclerotic heart disease of native coronary artery without angina pectoris: Secondary | ICD-10-CM

## 2024-01-21 DIAGNOSIS — E871 Hypo-osmolality and hyponatremia: Secondary | ICD-10-CM | POA: Diagnosis not present

## 2024-01-21 DIAGNOSIS — R2681 Unsteadiness on feet: Secondary | ICD-10-CM | POA: Diagnosis not present

## 2024-01-21 DIAGNOSIS — R54 Age-related physical debility: Secondary | ICD-10-CM | POA: Diagnosis not present

## 2024-01-21 DIAGNOSIS — E782 Mixed hyperlipidemia: Secondary | ICD-10-CM | POA: Diagnosis not present

## 2024-01-21 DIAGNOSIS — E1159 Type 2 diabetes mellitus with other circulatory complications: Secondary | ICD-10-CM | POA: Diagnosis not present

## 2024-01-21 DIAGNOSIS — I10 Essential (primary) hypertension: Secondary | ICD-10-CM | POA: Diagnosis not present

## 2024-01-21 DIAGNOSIS — I2510 Atherosclerotic heart disease of native coronary artery without angina pectoris: Secondary | ICD-10-CM | POA: Diagnosis not present

## 2024-01-21 DIAGNOSIS — I25119 Atherosclerotic heart disease of native coronary artery with unspecified angina pectoris: Secondary | ICD-10-CM | POA: Diagnosis not present

## 2024-01-21 LAB — LAB REPORT - SCANNED
A1c: 6.5
EGFR: 89

## 2024-01-22 DIAGNOSIS — R2681 Unsteadiness on feet: Secondary | ICD-10-CM | POA: Diagnosis not present

## 2024-01-22 DIAGNOSIS — Z9181 History of falling: Secondary | ICD-10-CM | POA: Diagnosis not present

## 2024-01-25 ENCOUNTER — Other Ambulatory Visit: Payer: Self-pay

## 2024-01-25 DIAGNOSIS — E039 Hypothyroidism, unspecified: Secondary | ICD-10-CM

## 2024-01-25 MED ORDER — LEVOTHYROXINE SODIUM 25 MCG PO TABS
ORAL_TABLET | ORAL | 1 refills | Status: AC
Start: 2024-01-25 — End: ?

## 2024-01-26 DIAGNOSIS — H353122 Nonexudative age-related macular degeneration, left eye, intermediate dry stage: Secondary | ICD-10-CM | POA: Diagnosis not present

## 2024-01-27 ENCOUNTER — Ambulatory Visit: Payer: Self-pay | Admitting: Endocrinology

## 2024-01-29 DIAGNOSIS — Z9181 History of falling: Secondary | ICD-10-CM | POA: Diagnosis not present

## 2024-01-29 DIAGNOSIS — R2681 Unsteadiness on feet: Secondary | ICD-10-CM | POA: Diagnosis not present

## 2024-02-03 ENCOUNTER — Other Ambulatory Visit: Payer: Self-pay | Admitting: Cardiovascular Disease

## 2024-02-11 DIAGNOSIS — Z1231 Encounter for screening mammogram for malignant neoplasm of breast: Secondary | ICD-10-CM | POA: Diagnosis not present

## 2024-02-16 ENCOUNTER — Other Ambulatory Visit: Payer: Self-pay | Admitting: Obstetrics & Gynecology

## 2024-02-16 DIAGNOSIS — I25119 Atherosclerotic heart disease of native coronary artery with unspecified angina pectoris: Secondary | ICD-10-CM | POA: Diagnosis not present

## 2024-02-16 DIAGNOSIS — R928 Other abnormal and inconclusive findings on diagnostic imaging of breast: Secondary | ICD-10-CM

## 2024-02-16 DIAGNOSIS — E1159 Type 2 diabetes mellitus with other circulatory complications: Secondary | ICD-10-CM | POA: Diagnosis not present

## 2024-02-16 DIAGNOSIS — I10 Essential (primary) hypertension: Secondary | ICD-10-CM | POA: Diagnosis not present

## 2024-02-16 DIAGNOSIS — I2510 Atherosclerotic heart disease of native coronary artery without angina pectoris: Secondary | ICD-10-CM | POA: Diagnosis not present

## 2024-02-18 ENCOUNTER — Ambulatory Visit
Admission: RE | Admit: 2024-02-18 | Discharge: 2024-02-18 | Disposition: A | Discharge status: Home / Self Care | Source: Ambulatory Visit | Attending: Obstetrics & Gynecology | Admitting: Obstetrics & Gynecology

## 2024-02-18 ENCOUNTER — Ambulatory Visit: Admission: RE | Admit: 2024-02-18 | Source: Ambulatory Visit

## 2024-02-18 DIAGNOSIS — R928 Other abnormal and inconclusive findings on diagnostic imaging of breast: Secondary | ICD-10-CM | POA: Diagnosis not present

## 2024-02-19 DIAGNOSIS — L650 Telogen effluvium: Secondary | ICD-10-CM | POA: Diagnosis not present

## 2024-02-19 DIAGNOSIS — L820 Inflamed seborrheic keratosis: Secondary | ICD-10-CM | POA: Diagnosis not present

## 2024-02-19 DIAGNOSIS — L538 Other specified erythematous conditions: Secondary | ICD-10-CM | POA: Diagnosis not present

## 2024-02-23 ENCOUNTER — Encounter

## 2024-02-23 ENCOUNTER — Other Ambulatory Visit

## 2024-02-25 DIAGNOSIS — H353122 Nonexudative age-related macular degeneration, left eye, intermediate dry stage: Secondary | ICD-10-CM | POA: Diagnosis not present

## 2024-03-15 DIAGNOSIS — H353211 Exudative age-related macular degeneration, right eye, with active choroidal neovascularization: Secondary | ICD-10-CM | POA: Diagnosis not present

## 2024-03-22 ENCOUNTER — Other Ambulatory Visit: Payer: Self-pay | Admitting: Cardiovascular Disease

## 2024-05-02 ENCOUNTER — Other Ambulatory Visit

## 2024-05-02 ENCOUNTER — Other Ambulatory Visit: Payer: Self-pay

## 2024-05-02 ENCOUNTER — Telehealth: Payer: Self-pay | Admitting: Endocrinology

## 2024-05-02 DIAGNOSIS — E039 Hypothyroidism, unspecified: Secondary | ICD-10-CM

## 2024-05-02 MED ORDER — LEVOTHYROXINE SODIUM 25 MCG PO TABS: ORAL_TABLET | ORAL | 1 refills | Status: AC

## 2024-05-02 NOTE — Telephone Encounter (Signed)
 MEDICATION: levothyroxine  (SYNTHROID ) 25 MCG tablet   PHARMACY:   [id number] [place], [place] - [address] AT [facility] [address] [facility] [address] [place] [address] Phone: [phone]  Fax: [phone]    HAS THE PATIENT CONTACTED THEIR PHARMACY?  Yes  LAST REFILL:  @@LASTREFILL @  IS THIS A 90 DAY SUPPLY : Yes  IS PATIENT OUT OF MEDICATION: Yes, patient misplaced medication and needs a new RX sent  IF NOT; HOW MUCH IS LEFT:   LAST APPOINTMENT DATE: @9 /12/2023  NEXT APPOINTMENT DATE:@12 /15/2025  DO WE HAVE YOUR PERMISSION TO LEAVE A DETAILED MESSAGE?: Ye  OTHER COMMENTS:    **Let patient know to contact pharmacy at the end of the day to make sure medication is ready. **  ** Please notify patient to allow 48-72 hours to process**  **Encourage patient to contact the pharmacy for refills or they can request refills through [facility]**

## 2024-05-03 ENCOUNTER — Ambulatory Visit: Payer: Self-pay | Admitting: Endocrinology

## 2024-05-03 LAB — TSH: TSH: 1.58 m[IU]/L (ref 0.40–4.50)

## 2024-05-03 LAB — T4, FREE: Free T4: 1.6 ng/dL (ref 0.8–1.8)

## 2024-05-05 ENCOUNTER — Ambulatory Visit: Admitting: Endocrinology

## 2024-05-05 ENCOUNTER — Ambulatory Visit: Payer: Self-pay | Admitting: Endocrinology

## 2024-05-05 ENCOUNTER — Encounter: Payer: Self-pay | Admitting: Endocrinology

## 2024-05-05 VITALS — BP 152/88 | HR 75 | Ht 60.0 in | Wt 130.0 lb

## 2024-05-05 DIAGNOSIS — E039 Hypothyroidism, unspecified: Secondary | ICD-10-CM

## 2024-05-05 DIAGNOSIS — R7303 Prediabetes: Secondary | ICD-10-CM

## 2024-05-05 DIAGNOSIS — Z780 Asymptomatic menopausal state: Secondary | ICD-10-CM

## 2024-05-05 DIAGNOSIS — E559 Vitamin D deficiency, unspecified: Secondary | ICD-10-CM | POA: Diagnosis not present

## 2024-05-05 DIAGNOSIS — M8580 Other specified disorders of bone density and structure, unspecified site: Secondary | ICD-10-CM

## 2024-05-05 LAB — POCT GLYCOSYLATED HEMOGLOBIN (HGB A1C): Hemoglobin A1C: 6.3 % — AB (ref 4.0–5.6)

## 2024-05-05 NOTE — Progress Notes (Signed)
 Outpatient Endocrinology Note [name], MD  05/05/2024  Patient's Name: Brittany Figueroa    DOB: [birth year]    MRN: [id number]                                                    REASON OF VISIT: Follow up of prediabetes / hypothyroidism / osteopenia  PCP: Charlott Dorn LABOR, MD  HISTORY OF PRESENT ILLNESS:   Brittany Figueroa is a 83 y.o. old female with past medical history listed below, is here for follow up for prediabetes / hypothyroidism / osteopenia.  Pertinent Diabetes History: Patient has prediabetes 10+ years.  She had hemoglobin A1c in the range of 6%.  In the past she had hemoglobin A1c up to 6.8% however likely related with steroid use per prior endocrine office note.  She was being managed as prediabetes. She has been intermittently on metformin  extended release since April 2017.  Current / Home Diabetic regimen includes:  None  Prior medication: Metformin  in the past stopped March 2025 due to GI intolerance.  Glycemic data:   She has [employer].  Forgot to bring glucometer in the clinic today.  Patient reports she mostly has blood sugar in the low 100s and highest blood sugar was 135.   # OSTEOPENIA:   Lowest T score is at the right neck femur previously -2.4  This is only 0.8 as of 11/14/2019 BMD at the left femoral neck was -2.0 compared to -1.8   Results from 08/14/2022 done on a different machine:    Lumbar spine L1-L2 Femoral neck (FN)    T-score -2.0 RFN: -2.2 LFN: -1.7    Apparently had tried Fosamax in the past and this caused some GI side effects She received RECLAST  infusions on 12/15/17, 01/07/2020,  6/23 in June 2025.  Plan was to continue Reclast  every 2 years for osteopenia.   # Vitamin D  deficiency: She was taking OTC vitamin D  4000 U daily.  # HYPOTHYROIDISM: TSH level previously has been as high as 5.6, this spontaneously improved but in 05/2018 was mildly increased again. At that time she was complaining of some fatigue,  constipation, dry skin, some cold intolerance and difficulty losing weight.   She is taking levothyroxine  37.5 mcg daily, she did have some improvement in her fatigue with the higher dose.  Interval history  Patient received Reclast  in June.  She has no fall and fracture.  She has been taking vitamin D  regularly, does not take extra calcium  however drinks milk and dairy products daily.  No palpitation and heat intolerance.  Recent thyroid  function test normal.  She has been taking levothyroxine  37 5 mcg daily.  Hemoglobin A1c today 6.3%.  No other complaints today.   REVIEW OF SYSTEMS As per history of present illness.   PAST MEDICAL HISTORY: Past Medical History:  Diagnosis Date   Abnormal finding on EKG, new anterolateral T-wave inversions  05/14/2012   Abnormal nuclear stress test, with infero-lateral ischemia 05/14/2012   Arthritis    right thumb; all my fingers (05/13/2012), cervical spondylosis    Atypical angina 05/14/2012   CAD (coronary artery disease), 05/13/12, with 80% LAD 05/14/2012   Cancer (HCC)    basal cell on facial in L temporal region     [facility] arthritis, thumb, degenerative    Complication of anesthesia 12/2009  had endoscopy; larynx went into spasms; stopped breathing for 15 seconds (05/13/2012)   GERD (gastroesophageal reflux disease) 2011   Hypercholesteremia    Hypertension    S/P angioplasty with stent, LAD 05/13/12 05/14/2012   Tuberculosis    test +- [place] [facility] - Dr. Sherrilee- CXR- OK    PAST SURGICAL HISTORY: Past Surgical History:  Procedure Laterality Date   ANTERIOR CERVICAL DECOMP/DISCECTOMY FUSION N/A 12/13/2013   Procedure: ANTERIOR CERVICAL DECOMPRESSION/DISCECTOMY FUSION 3 LEVELS Cervical four/five,five/six,six/seven. Anterior cervical disectomy and fusion with peek and plate ;  Surgeon: Victory DELENA Gunnels, MD;  Location: [facility]URO ORS;  Service: Neurosurgery;  Laterality: N/A;   CARDIAC CATHETERIZATION N/A 07/02/2015   Procedure: Left Heart Cath  and Coronary Angiography;  Surgeon: Dorn JINNY Lesches, MD;  Location: [facility] INVASIVE CV LAB;  Service: Cardiovascular;  Laterality: N/A;   CORONARY ANGIOPLASTY WITH STENT PLACEMENT  05/13/2012   DES to LAD; she has total RCA with left to rt coll. normal LV function done for positive nuc study   DILATION AND CURETTAGE OF UTERUS  1960's; 1970's; 1980   probably 3 (05/13/2012)   FOREARM / WRIST TENDON LESION EXCISION  ?11/2001   right; did waver to try to get ulnar out of hole that it had cut (05/13/2012   INGUINAL HERNIA REPAIR  ~ 1966; 07/21/2002   right; left (05/13/2012)   LEFT HEART CATHETERIZATION WITH CORONARY ANGIOGRAM N/A 05/13/2012   Procedure: LEFT HEART CATHETERIZATION WITH CORONARY ANGIOGRAM;  Surgeon: Dorn JINNY Lesches, MD;  Location: [facility] CATH LAB;  Service: Cardiovascular;  Laterality: N/A;   OSTEOTOMY AND ULNAR SHORTENING  07/21/2002   right (05/13/2012)   TONSILLECTOMY AND ADENOIDECTOMY  1950's   TUBAL LIGATION  1980    ALLERGIES: Allergies  Allergen Reactions   Statins Other (See Comments)    intermittent loss of circulation; hands and arms will go to sleep; feet will get cramps; fatigue (05/13/2012).  This occurred with Lipitor, Zocor, Crestor  5 mg qd, and she thinks pravastatin  as well   Amlodipine  Itching   Gadolinium Derivatives Itching and Nausea Only    Pt had severe nausea and itching on legs, neck and back, per dr shoshana pt needs 13 hour prep before contrast in the future    FAMILY HISTORY:  Family History  Problem Relation Age of Onset   Hypertension Mother    Diabetes Mother    Hyperlipidemia Father    Heart disease Father    Stroke Father    Heart disease Sister    Diabetes Sister    Heart disease Brother    Diabetes Maternal Aunt     SOCIAL HISTORY: Social History   Socioeconomic History   Marital status: Divorced    Spouse name: Not on file   Number of children: Not on file   Years of education: Not on file   Highest education level: Not on  file  Occupational History   Not on file  Tobacco Use   Smoking status: Former    Current packs/day: 0.00    Average packs/day: 0.5 packs/day for 20.0 years (10.0 ttl pk-yrs)    Types: Cigarettes    Start date: 07/05/1968    Quit date: 07/05/1988    Years since quitting: 35.8   Smokeless tobacco: Never  Vaping Use   Vaping status: Never Used  Substance and Sexual Activity   Alcohol use: Yes    Alcohol/week: 7.0 standard drinks of alcohol    Types: 7 Glasses of wine per week  Comment: a glass of wine with meals   Drug use: No   Sexual activity: Never  Other Topics Concern   Not on file  Social History Narrative   Not on file   Social Drivers of Health   Tobacco Use: Medium Risk (05/05/2024)   Patient History    Smoking Tobacco Use: Former    Smokeless Tobacco Use: Never    Passive Exposure: Not on Actuary Strain: Not on file  Food Insecurity: No Food Insecurity (07/29/2023)   Hunger Vital Sign    Worried About Running Out of Food in the Last Year: Never true    Ran Out of Food in the Last Year: Never true  Transportation Needs: No Transportation Needs (07/29/2023)   PRAPARE - Administrator, Civil Service (Medical): No    Lack of Transportation (Non-Medical): No  Physical Activity: Not on file  Stress: Not on file  Social Connections: Moderately Isolated (07/29/2023)   Social Connection and Isolation Panel    Frequency of Communication with Friends and Family: More than three times a week    Frequency of Social Gatherings with Friends and Family: More than three times a week    Attends Religious Services: More than 4 times per year    Active Member of [employer] or Organizations: No    Attends Banker Meetings: Never    Marital Status: Divorced  Depression (PHQ2-9): Not on file  Alcohol Screen: Not on file  Housing: Low Risk (07/29/2023)   Housing Stability Vital Sign    Unable to Pay for Housing in the Last Year: No    Number of  Times Moved in the Last Year: 0    Homeless in the Last Year: No  Utilities: Not At Risk (07/29/2023)   AHC Utilities    Threatened with loss of utilities: No  Health Literacy: Not on file    MEDICATIONS:  Current Outpatient Medications  Medication Sig Dispense Refill   acetaminophen  (TYLENOL ) 500 MG tablet Take 500 mg by mouth every 6 (six) hours as needed for mild pain or headache. (Patient not taking: Reported on 12/28/2023)     aspirin  EC (ASPIRIN  81) 81 MG tablet Take 1 tablet (81 mg total) by mouth daily. 90 tablet 3   calcium -vitamin D  (OSCAL WITH D) 500-200 MG-UNIT per tablet Take 1 tablet by mouth daily.     carvedilol  (COREG ) 12.5 MG tablet Take 12.5 mg by mouth 2 (two) times daily.     carvedilol  (COREG ) 25 MG tablet Take 25 mg by mouth 2 (two) times daily with a meal.     Cholecalciferol (VITAMIN D3) 2000 UNITS TABS Take 2,000 Units by mouth 2 (two) times daily.     clopidogrel  (PLAVIX ) 75 MG tablet TAKE 1 TABLET(75 MG) BY MOUTH DAILY 90 tablet 3   diclofenac sodium (VOLTAREN) 1 % GEL Apply 2 g topically daily as needed. For pain     DILT-XR 240 MG 24 hr capsule Take 240 mg by mouth daily.     Evolocumab  (REPATHA  SURECLICK) 140 MG/ML SOAJ Inject 140 mg into the skin every 14 (fourteen) days. 6 mL 1   ezetimibe  (ZETIA ) 10 MG tablet TAKE 1 TABLET(10 MG) BY MOUTH DAILY 90 tablet 3   famotidine (PEPCID) 20 MG tablet Take 20 mg by mouth at bedtime. (Patient taking differently: Take 20 mg by mouth as needed.)     fexofenadine (ALLEGRA) 180 MG tablet Take 180 mg by mouth daily as needed for  allergies or rhinitis. (Patient not taking: Reported on 12/28/2023)     glucose blood (ONETOUCH VERIO) test strip Use to test blood sugar once daily Dx code E11.9 30 each 4   irbesartan  (AVAPRO ) 300 MG tablet TAKE 1/2 TABLET BY MOUTH TWICE DAILY IN THE MORNING AND AT BEDTIME 180 tablet 3   levothyroxine  (SYNTHROID ) 25 MCG tablet TAKE 1 AND 1/2 TABLETS BY MOUTH DAILY BEFORE BREAKFAST 135 tablet 1    Multiple Vitamin (MULTIVITAMIN WITH MINERALS) TABS Take 1 tablet by mouth daily.     Multiple Vitamins-Minerals (PRESERVISION AREDS 2+MULTI VIT PO) Take 1 tablet by mouth in the morning and at bedtime.     nitroGLYCERIN  (NITROSTAT ) 0.4 MG SL tablet DISSOLVE ONE TABLET UNDER TONGUE AS NEEDED FOR CHEST PAIN EVERY 5 MINUTES 25 tablet 11   VASCEPA  1 g capsule TAKE 2 CAPSULES(2 GRAMS) BY MOUTH TWICE DAILY. 360 capsule 3   No current facility-administered medications for this visit.    PHYSICAL EXAM: Vitals:   05/05/24 0954  BP: (!) 152/88  Pulse: 75  SpO2: 96%  Weight: 130 lb (59 kg)  Height: 5' (1.524 m)    Body mass index is 25.39 kg/m.  Wt Readings from Last 3 Encounters:  05/05/24 130 lb (59 kg)  12/28/23 132 lb (59.9 kg)  11/16/23 133 lb (60.3 kg)    General: Well developed, well nourished female in no apparent distress.  HEENT: AT/[place], no external lesions.  Eyes: Conjunctiva clear and no icterus. Neck: Neck supple  Lungs: Respirations not labored Neurologic: Alert, oriented, normal speech Extremities / Skin: Dry.  Psychiatric: Does not appear depressed or anxious  Diabetic Foot Exam - Simple   No data filed   LABS Reviewed Lab Results  Component Value Date   HGBA1C 6.3 (A) 05/05/2024   HGBA1C 6.3 (H) 07/21/2023   HGBA1C 6.6 (H) 03/23/2023   Lab Results  Component Value Date   FRUCTOSAMINE 242 06/21/2015   Lab Results  Component Value Date   CHOL 214 (H) 03/04/2023   HDL 50 03/04/2023   LDLCALC 111 (H) 03/04/2023   LDLDIRECT 130.1 04/04/2014   TRIG 311 (H) 03/04/2023   CHOLHDL 4.3 03/04/2023   No results found for: [facility]  Lab Results  Component Value Date   CREATININE 0.53 07/30/2023   Lab Results  Component Value Date   GFR 83.63 03/23/2023    ASSESSMENT / PLAN  1. Acquired hypothyroidism   2. Prediabetes   3. Osteopenia after menopause   4. Vitamin D  deficiency    # Prediabetes -Patient is being managed as prediabetes.  However she  had hemoglobin A1c of more than 6.5% in the past per office note was likely related to steroid intake.  In 03/2023 hemoglobin A1c 6.6%, patient reports she has not been fully compliant with metformin .   - She is on lifestyle modification for prediabetes management.  She had taken metformin  in the past which was stopped due to GI intolerance in March 2025. Technically she has type 2 diabetes mellitus, however likely related to his steroid use.  Will manage already has prediabetes.   Plan: - Stay on lifestyle modification with diet control for prediabetes management.  No plan for antidiabetic medication. -Hemoglobin A1c acceptable.  Reported blood sugar acceptable at home. -Check blood sugar in the morning fasting daily and occasionally at bedtime.  # Osteopenia -She is status post 4 infusion of Reclast  with last 1 in June 2023.  She had last DEXA scan in March 2024 consistent with  osteopenia. -Last Reclast  was in June 2025.  Will continue with repeat 2 years of Reclast  infusion. -Vies patient to restart vitamin D3 over-the-counter 2000 international unit daily.  Continue current calcium  supplement from diet.  # Hypothyroidism -Continue current dose of levothyroxine  37.5 mcg daily.  1 and half tablets of 25 mcg levothyroxine .  Patient thyroid  function test normal recently.  Endocrinology follow-up every 6 months.  Danijela Vessey was seen today for medical management of chronic issues.  Diagnoses and all orders for this visit:  Acquired hypothyroidism -     T4, free -     TSH  Prediabetes -     POCT glycosylated hemoglobin (Hb A1C) -     Renal function panel  Osteopenia after menopause -     Renal function panel  Vitamin D  deficiency -     VITAMIN D  25 Hydroxy (Vit-D Deficiency, Fractures)       DISPOSITION Follow up in clinic in 6 months suggested.  Labs prior to follow-up visit as ordered.   All questions answered and patient verbalized understanding of the plan.  Scarleth Brame, MD [facility] Endocrinology [facility] [address] [place], [address] [place], [place] [address] Phone # [phone]  At least part of this note was generated using voice recognition software. Inadvertent word errors may have occurred, which were not recognized during the proofreading process.

## 2024-05-06 ENCOUNTER — Ambulatory Visit: Admitting: Endocrinology

## 2024-05-06 ENCOUNTER — Other Ambulatory Visit: Payer: Self-pay | Admitting: Endocrinology

## 2024-05-06 DIAGNOSIS — E039 Hypothyroidism, unspecified: Secondary | ICD-10-CM

## 2024-10-31 ENCOUNTER — Other Ambulatory Visit

## 2024-11-03 ENCOUNTER — Ambulatory Visit: Admitting: Endocrinology
